# Patient Record
Sex: Male | Born: 1960 | Race: White | Hispanic: No | Marital: Married | Smoking: Former smoker
Health system: Southern US, Community
[De-identification: ages and names within clinical notes are randomized; demographics above are authoritative.]

## PROBLEM LIST (undated history)

## (undated) DIAGNOSIS — F329 Major depressive disorder, single episode, unspecified: Secondary | ICD-10-CM

## (undated) DIAGNOSIS — G4733 Obstructive sleep apnea (adult) (pediatric): Secondary | ICD-10-CM

## (undated) DIAGNOSIS — T7840XA Allergy, unspecified, initial encounter: Secondary | ICD-10-CM

## (undated) DIAGNOSIS — E119 Type 2 diabetes mellitus without complications: Secondary | ICD-10-CM

## (undated) DIAGNOSIS — R112 Nausea with vomiting, unspecified: Secondary | ICD-10-CM

## (undated) DIAGNOSIS — E785 Hyperlipidemia, unspecified: Secondary | ICD-10-CM

## (undated) DIAGNOSIS — F32A Depression, unspecified: Secondary | ICD-10-CM

## (undated) DIAGNOSIS — I10 Essential (primary) hypertension: Secondary | ICD-10-CM

## (undated) DIAGNOSIS — J439 Emphysema, unspecified: Secondary | ICD-10-CM

## (undated) DIAGNOSIS — F419 Anxiety disorder, unspecified: Secondary | ICD-10-CM

## (undated) DIAGNOSIS — J449 Chronic obstructive pulmonary disease, unspecified: Secondary | ICD-10-CM

## (undated) DIAGNOSIS — M1990 Unspecified osteoarthritis, unspecified site: Secondary | ICD-10-CM

## (undated) DIAGNOSIS — Z9889 Other specified postprocedural states: Secondary | ICD-10-CM

## (undated) HISTORY — DX: Type 2 diabetes mellitus without complications: E11.9

## (undated) HISTORY — DX: Unspecified osteoarthritis, unspecified site: M19.90

## (undated) HISTORY — PX: CARDIAC CATHETERIZATION: SHX172

## (undated) HISTORY — DX: Hyperlipidemia, unspecified: E78.5

## (undated) HISTORY — DX: Chronic obstructive pulmonary disease, unspecified: J44.9

## (undated) HISTORY — PX: LEG SURGERY: SHX1003

## (undated) HISTORY — DX: Obstructive sleep apnea (adult) (pediatric): G47.33

## (undated) HISTORY — PX: SHOULDER ARTHROSCOPY: SHX128

## (undated) HISTORY — DX: Emphysema, unspecified: J43.9

## (undated) HISTORY — DX: Essential (primary) hypertension: I10

## (undated) HISTORY — DX: Morbid (severe) obesity due to excess calories: E66.01

## (undated) HISTORY — DX: Allergy, unspecified, initial encounter: T78.40XA

## (undated) HISTORY — PX: TONSILLECTOMY: SUR1361

---

## 2004-08-17 ENCOUNTER — Emergency Department (HOSPITAL_COMMUNITY): Admission: EM | Admit: 2004-08-17 | Discharge: 2004-08-17 | Payer: Self-pay | Admitting: Emergency Medicine

## 2008-03-19 ENCOUNTER — Inpatient Hospital Stay (HOSPITAL_COMMUNITY): Admission: EM | Admit: 2008-03-19 | Discharge: 2008-03-20 | Payer: Self-pay | Admitting: Emergency Medicine

## 2008-03-20 ENCOUNTER — Encounter (INDEPENDENT_AMBULATORY_CARE_PROVIDER_SITE_OTHER): Payer: Self-pay | Admitting: *Deleted

## 2008-03-20 ENCOUNTER — Ambulatory Visit: Payer: Self-pay | Admitting: Vascular Surgery

## 2009-06-09 ENCOUNTER — Encounter: Payer: Self-pay | Admitting: Pulmonary Disease

## 2009-08-18 ENCOUNTER — Encounter: Admission: RE | Admit: 2009-08-18 | Discharge: 2009-08-18 | Payer: Self-pay | Admitting: Emergency Medicine

## 2009-09-08 ENCOUNTER — Encounter: Payer: Self-pay | Admitting: Pulmonary Disease

## 2010-02-06 ENCOUNTER — Encounter: Payer: Self-pay | Admitting: Internal Medicine

## 2010-02-06 ENCOUNTER — Encounter: Admission: RE | Admit: 2010-02-06 | Discharge: 2010-02-06 | Payer: Self-pay | Admitting: Internal Medicine

## 2010-04-17 ENCOUNTER — Ambulatory Visit: Payer: Self-pay | Admitting: Internal Medicine

## 2010-04-17 DIAGNOSIS — J438 Other emphysema: Secondary | ICD-10-CM

## 2010-04-17 DIAGNOSIS — R0609 Other forms of dyspnea: Secondary | ICD-10-CM | POA: Insufficient documentation

## 2010-04-17 DIAGNOSIS — J449 Chronic obstructive pulmonary disease, unspecified: Secondary | ICD-10-CM | POA: Insufficient documentation

## 2010-04-17 DIAGNOSIS — R0989 Other specified symptoms and signs involving the circulatory and respiratory systems: Secondary | ICD-10-CM

## 2010-04-17 DIAGNOSIS — I10 Essential (primary) hypertension: Secondary | ICD-10-CM | POA: Insufficient documentation

## 2010-04-17 DIAGNOSIS — R635 Abnormal weight gain: Secondary | ICD-10-CM | POA: Insufficient documentation

## 2010-05-18 ENCOUNTER — Ambulatory Visit: Payer: Self-pay | Admitting: Internal Medicine

## 2010-05-18 DIAGNOSIS — G479 Sleep disorder, unspecified: Secondary | ICD-10-CM | POA: Insufficient documentation

## 2010-05-18 DIAGNOSIS — R05 Cough: Secondary | ICD-10-CM | POA: Insufficient documentation

## 2010-05-18 DIAGNOSIS — R059 Cough, unspecified: Secondary | ICD-10-CM | POA: Insufficient documentation

## 2010-05-19 ENCOUNTER — Telehealth (INDEPENDENT_AMBULATORY_CARE_PROVIDER_SITE_OTHER): Payer: Self-pay | Admitting: *Deleted

## 2010-05-20 ENCOUNTER — Ambulatory Visit: Payer: Self-pay | Admitting: Pulmonary Disease

## 2010-05-20 DIAGNOSIS — G4733 Obstructive sleep apnea (adult) (pediatric): Secondary | ICD-10-CM | POA: Insufficient documentation

## 2010-05-21 ENCOUNTER — Telehealth (INDEPENDENT_AMBULATORY_CARE_PROVIDER_SITE_OTHER): Payer: Self-pay | Admitting: *Deleted

## 2010-05-22 ENCOUNTER — Telehealth (INDEPENDENT_AMBULATORY_CARE_PROVIDER_SITE_OTHER): Payer: Self-pay | Admitting: *Deleted

## 2010-05-22 ENCOUNTER — Ambulatory Visit: Payer: Self-pay | Admitting: Cardiology

## 2010-06-08 ENCOUNTER — Telehealth (INDEPENDENT_AMBULATORY_CARE_PROVIDER_SITE_OTHER): Payer: Self-pay | Admitting: *Deleted

## 2010-07-23 ENCOUNTER — Encounter: Payer: Self-pay | Admitting: Pulmonary Disease

## 2010-08-11 NOTE — Miscellaneous (Signed)
Summary: Orders Update pft charges  Clinical Lists Changes  Orders: Added new Service order of Carbon Monoxide diffusing w/capacity (94720) - Signed Added new Service order of Lung Volumes (94240) - Signed Added new Service order of Spirometry (Pre & Post) (94060) - Signed 

## 2010-08-11 NOTE — Assessment & Plan Note (Signed)
Summary: consult for osa, daytime sleepiness.   Visit Type:  Initial Consult Copy to:  Sandrea Hughs MD Primary Provider/Referring Provider:  Dr. Perrin Maltese  CC:  Sleep consult. pt currently has cpap machine. pt c/o waking up more at night, feels like he is suffication, and stuffiness.  History of Present Illness: The pt is a 50y/o male who I have been asked to see for management of osa.  He was diagnosed with osa one year ago, with AHI of 15/hr and desat to 83%.  There were no PLMS observed.  The pt underwent cpap titration study in Feb of this year, which showed his optimal pressure to be anywhere between 5 and 9cm of water.  The pt has been on cpap with nasal pillows for the last 6mos, and denies any issues with mouth opening or pressure loss.  He does have heated humidity.  He is unsure where his machine is currently set wrt pressure.  The pt currently goes to bed btw 11pm-3am, and arises btw 6-7am to start his day.  Prior to cpap, he denied having loud snoring, pauses in his breathing during sleep, or choking arousals.  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  He has felt all along this was due to side effects from meds.  The pt denies any alertness issues with tv, reading, or computer work prior to starting cpap.  Since he has been on cpap, he has seen no difference in his sleep quality or alertness with driving in the late afternoons.  Of note, the pt also states that he has had insomnia "all of his life", and currently is requiring ambien to get to sleep at all.  His epworth score today is only 6, and he tells me that his weight is neutral over the last one year although has increased significantly over the last few years.  Current Medications (verified): 1)  Aspirin 81 Mg Tbec (Aspirin) .Marland Kitchen.. 1 Once Daily 2)  Ibuprofen 600 Mg Tabs (Ibuprofen) .... Take With  Meals As Needed For Pain 3)  Neurontin 300 Mg Caps (Gabapentin) .... 3 Tablets Three Times A Day 4)  Simvastatin 20 Mg Tabs (Simvastatin) .Marland Kitchen.. 1 Once Daily 5)  Zolpidem Tartrate 10 Mg Tabs (Zolpidem Tartrate) .Marland Kitchen.. 1 At Bedtime As Needed 6)  Tramadol Hcl 50 Mg Tabs (Tramadol Hcl) .... Up To 8 Per Day As Needed 7)  Nitrostat 0.4 Mg Subl (Nitroglycerin) .... As Directed As Needed 8)  Alprazolam 1 Mg Tabs (Alprazolam) .... 1/2 To 2  Daily As Needed 9)  Daily Multiple Vitamins  Tabs (Multiple Vitamin) .Marland Kitchen.. 1 Once Daily 10)  Vitamin D 2000 Unit Tabs (Cholecalciferol) .Marland Kitchen.. 1 Once Daily 11)  B-100 Complex  Tabs (Vitamins-Lipotropics) .Marland Kitchen.. 1 Once Daily 12)  Garlic 100 Mg .Marland Kitchen.. 1 Once Daily 13)  Lutein 40 Mg Caps (Lutein) .Marland Kitchen.. 1 Once Daily 14)  Calcium 500 Mg Tabs (Calcium) .Marland Kitchen.. 1 Once Daily 15)  Magnesium 500 Mg Tabs (Magnesium) .Marland Kitchen.. 1 Once Daily 16)  Potassium 99 Mg Tabs (Potassium) .Marland Kitchen.. 1 Once Daily 17)  Shark Cartilage 740 Mg Caps (Shark Cartilage) .Marland Kitchen.. 1 Three Times A Day 18)  Benicar Hct 20-12.5 Mg  Tabs (Olmesartan Medoxomil-Hctz) .... One Tablet By Mouth Daily 19)  Pepcid 20 Mg Tabs (Famotidine) .... Take One By Mouth At Bedtime 20)  Prilosec Otc 20 Mg  Tbec (Omeprazole Magnesium) .... Take  One 30-60 Min Before First Meal of The Day  Allergies (verified): 1)  ! Pcn 2)  ! Diovan  Past History:  Social History: Last updated: 04/17/2010 Former smoker.  Quit in Feb 2005.  Smoked 26 yrs up to 2 ppd Rare ETOH Lives with his partner GM at [employer]  Past Medical History: Hyptertension     - try off ACE April 17, 2010  Unexplained/ variable sob since 2000      - PFT's wnl May 18, 2010 x ERV 24% Morbid Obesity OSA--mild by npsg 2010 with AHI 14/hr.      low cholesterol  Past Surgical History: Tonsillectomy left leg surrery  Family History: Reviewed history from 04/17/2010 and no changes required. Emphysema- PGF Lung CA- Uncle- never was a smoker Breast CA- Mother,  mgm Gallbladder CA- Father Ovarian CA- Mother MI--mgf mouth cancer--mgm skin--everyone  Social History: Reviewed history from 04/17/2010 and no changes required. Former smoker.  Quit in Feb 2005.  Smoked 26 yrs up to 2 ppd Rare ETOH Lives with his partner GM at [employer]  Review of Systems       The patient complains of shortness of breath with activity, shortness of breath at rest, non-productive cough, chest pain, difficulty swallowing, tooth/dental problems, nasal congestion/difficulty breathing through nose, sneezing, anxiety, depression, hand/feet swelling, joint stiffness or pain, and fever.  The patient denies productive cough, irregular heartbeats, acid heartburn, indigestion, loss of appetite, weight change, abdominal pain, sore throat, headaches, itching, ear ache, rash, and change in color of mucus.    Vital Signs:  Patient profile:   50 year old male Height:      76 inches Weight:      297.25 pounds O2 Sat:      92 % on Room air Temp:     98.7 degrees F oral Pulse rate:   88 / minute BP sitting:   130 / 72  (left arm) Cuff size:   large  Vitals Entered By: Carver Fila (May 20, 2010 10:58 AM)  O2 Flow:  Room air CC: Sleep consult. pt currently has cpap machine. pt c/o waking up more at night, feels like he is suffication, stuffiness Comments meds and allergies updated Phone number updated Carver Fila  May 20, 2010 10:58 AM    Physical Exam  General:  obese male in nad Eyes:  PERRLA and EOMI.   Nose:  patent without discharge or obstruction Mouth:  small oropharynx, palate and uvula not overly enlarged. Neck:  no jvd, tmg, LN Lungs:  clear to auscultation no wheezing or rhonchi Heart:  rrr, no mrg Abdomen:  soft and nontender, bs+ Extremities:  no significant edema, no cyanosis pulses intact distally. Neurologic:  alert and oriented, moves all 4.   Impression & Recommendations:  Problem # 1:  OBSTRUCTIVE SLEEP APNEA (ICD-327.23) the  pt has a h/o mild osa, and feels he has had no response to cpap since he started.  His only complaint of sleepiness occurs when he is driving in late afternoon and evening, and has no issues with sleepiness with other periods of inactivity such as reading, tv, desk work.  I am wondering how much of his sleepiness is simply due to his medications, given his lack of clinical response to cpap.  There are a lot of pts with mild osa that are totally asymptomatic, and his degree of sleep apnea really does not represent a significant increase in CV  risk.  Finally, the pt obviously has significant sleep hygiene (not enough sleep) and insomnia issues which are may be contributing more than his sleep disordered breathing.  Will work on the  premise for now that his sleep apnea is the primary issue.  Will arrange for re-optimization of his pressure with an auto device at home, and see if he has improvement.  If he sees no difference with the cpap, I would consider discontinuing since it may actually be making things worse, and suggest the pt work on his sleep hygiene.  He would also need to be referred to a behavioral therapist for cognitive behavioral therapy to treat his lifelong insomnia.  Would also consider discontinuing some of his meds that can contribute to daytime sleepiness.    Medications Added to Medication List This Visit: 1)  Neurontin 300 Mg Caps (Gabapentin) .... 3 tablets three times a day  Other Orders: Consultation Level IV (16109) DME Referral (DME)  Patient Instructions: 1)  will have your pressure re-optimized with an auto machine at home for the next 3 weeks.  will contact you once I receive your download. 2)  work on weight loss   Orders Added: 1)  Consultation Level IV [60454] 2)  DME Referral [DME]    Immunization History:  Influenza Immunization History:    Influenza:  historical (05/12/2010)  Pneumovax Immunization History:    Pneumovax:  historical (05/12/2010)

## 2010-08-11 NOTE — Progress Notes (Signed)
Summary: Losartan HCT RX called to [facility]  Phone Note Call from Patient Call back at Work Phone [phone]   Caller: Patient Call For: wert Summary of Call: calling about his losardone potassium please call Initial call taken by: Lacinda Axon,  June 08, 2010 9:13 AM  Follow-up for Phone Call        Pt says [facility] did not receive new RX for Losartan HCT. RX called to [facility] for the pt and pt is aware.Michel Bickers [facility]  June 08, 2010 10:39 AM

## 2010-08-11 NOTE — Progress Notes (Signed)
Summary: doens't want micardis> change to hyzaar 50/12.5  Phone Note Call from Patient   Caller: Patient Call For: wert Summary of Call: although pt's ins cover micardis, pt can't afford this and doesn't want it called in (nurse has already called this in to [facility]. pls advise.  Initial call taken by: Tivis Ringer, CNA,  May 22, 2010 9:39 AM  Follow-up for Phone Call        Pt states that micardis will be too expensive and requests that we change to the other covered medication which is Losartan-HCTZ. I called [facility] to cancel order for Micardis. Pelase advise if ok to change to losartan HCTZ, if so what strength. Carron Curie CMA  May 22, 2010 10:37 AM fine with me dose is 50/12.5   Follow-up by: Nyoka Cowden MD,  May 22, 2010 10:47 AM  Additional Follow-up for Phone Call Additional follow up Details #1::        Patient is aware MW will change Micardis HCT to Losartan HCT and we will send RX to [facility] for this.    New/Updated Medications: LOSARTAN POTASSIUM-HCTZ 50-12.5 MG TABS (LOSARTAN POTASSIUM-HCTZ) 1 by mouth daily Prescriptions: LOSARTAN POTASSIUM-HCTZ 50-12.5 MG TABS (LOSARTAN POTASSIUM-HCTZ) 1 by mouth daily  #90 x 4   Entered by:   Michel Bickers CMA   Authorized by:   Nyoka Cowden MD   Signed by:   Michel Bickers CMA on 05/22/2010   Method used:   Faxed to ...       [facility] (mail-order)             , [place]         Ph: [phone]       Fax: [phone]   RxID:   [id number]

## 2010-08-11 NOTE — Assessment & Plan Note (Signed)
Summary: Pulmonary consultation/ unexplained sob, try off ACEI first   Visit Type:  Initial Consult Copy to:  Dr. Perrin Maltese Primary Provider/Referring Provider:  Dr. Perrin Maltese  CC:  Restrictive Lung Dz.  History of Present Illness: 40 yowm quit smoking 2005 with pattern of recurrent pna in 20's and since 2000 sob attributed to weight gain and fatigue and now recurent episodes of sinus dz and "walking pna" esp bad since 2010  April 17, 2010  1st pulmonary office eval on ace cc sob  sometimes sitting still for up to a year never fully recovers x 1 year and sensation chest heaviness esp lying down x 1 year so underwent sleep study 6 months but getting worse if anything.  doe x [employer] sometimes a struggle but varies without any explanation and not directly related to activity.   Assoc with dry cough and sense of chest congestion sore throat and dysphagia but not excess mucus or noct or early am flares.  Pt denies any significant assoc  itching, sneezing,  nasal congestion or excess secretions,  fever, chills, sweats, unintended wt loss, pleuritic or exertional cp, hempoptysis,   orthopnea pnd or leg swelling. Pt also denies any obvious fluctuation in symptoms with weather or environmental change or other alleviating or aggravating factors.  doesn't think inhalers help       Current Medications (verified): 1)  Aspirin 81 Mg Tbec (Aspirin) .Marland Kitchen.. 1 Once Daily 2)  Ibuprofen 600 Mg Tabs (Ibuprofen) .Marland Kitchen.. 1 Once Daily 3)  Lisinopril-Hydrochlorothiazide 10-12.5 Mg Tabs (Lisinopril-Hydrochlorothiazide) .Marland Kitchen.. 1 Once Daily 4)  Neurontin 300 Mg Caps (Gabapentin) .... 3 Three Times A Day 5)  Simvastatin 20 Mg Tabs (Simvastatin) .Marland Kitchen.. 1 Once Daily 6)  Zolpidem Tartrate 10 Mg Tabs (Zolpidem Tartrate) .Marland Kitchen.. 1 At Bedtime As Needed 7)  Tramadol Hcl 50 Mg Tabs (Tramadol Hcl) .... Up To 8 Per Day As Needed 8)  Nitrostat 0.4 Mg Subl (Nitroglycerin) .... As Directed As Needed 9)  Alprazolam 1 Mg Tabs (Alprazolam) .... 1/2  To 2 Once Daily As Needed 10)  Daily Multiple Vitamins  Tabs (Multiple Vitamin) .Marland Kitchen.. 1 Once Daily 11)  Vitamin D 2000 Unit Tabs (Cholecalciferol) .Marland Kitchen.. 1 Once Daily 12)  B-100 Complex  Tabs (Vitamins-Lipotropics) .Marland Kitchen.. 1 Once Daily 13)  Garlic 100 Mg .Marland Kitchen.. 1 Once Daily 14)  Lutein 40 Mg Caps (Lutein) .Marland Kitchen.. 1 Once Daily 15)  Calcium 500 Mg Tabs (Calcium) .Marland Kitchen.. 1 Once Daily 16)  Magnesium 500 Mg Tabs (Magnesium) .Marland Kitchen.. 1 Once Daily 17)  Potassium 99 Mg Tabs (Potassium) .Marland Kitchen.. 1 Once Daily 18)  Shark Cartilage 740 Mg Caps (Shark Cartilage) .Marland Kitchen.. 1 Three Times A Day  Allergies (verified): 1)  ! Pcn 2)  ! Diovan  Past History:  Past Medical History: Hyptertension     - try off ACE April 17, 2010  Unexplained/ variable sob since 2000 Morbid Obesity  Family History: Emphysema- PGF Lung CA- Uncle- never was a smoker Breast CA- Mother Gallbladder CA- Father Ovarian CA- Mother  Social History: Former smoker.  Quit in Feb 2005.  Smoked 26 yrs up to 2 ppd Rare ETOH Lives with his partner GM at [employer]  Review of Systems       The patient complains of shortness of breath with activity, shortness of breath at rest, non-productive cough, chest pain, irregular heartbeats, weight change, difficulty swallowing, sore throat, tooth/dental problems, nasal congestion/difficulty breathing through nose, sneezing, anxiety, depression, hand/feet swelling, joint stiffness or pain, rash, and change in color  of mucus.  The patient denies productive cough, coughing up blood, acid heartburn, indigestion, loss of appetite, abdominal pain, headaches, itching, ear ache, and fever.    Vital Signs:  Patient profile:   50 year old male Height:      76 inches Weight:      294 pounds BMI:     35.92 O2 Sat:      96 % on Room air Temp:     98.3 degrees F oral Pulse rate:   97 / minute BP sitting:   148 / 86  (left arm) Cuff size:   large  Vitals Entered By: Vernie Murders (April 17, 2010 10:13 AM)  O2  Flow:  Room air  Serial Vital Signs/Assessments:  Comments: 10:57 AM Ambulatory Pulse Oximetry  Resting; HR__96___    02 Sat_94% on room air____  Lap1 (185 feet)   HR_162____   02 Sat__91% on room air___ Lap2 (185 feet)   HR__168___   02 Sat_91% on room air____    Lap3 (185 feet)   HR__160___   02 Sat__91% on room air___  _x__Test Completed without Difficulty ___Test Stopped due to:  By: Michel Bickers CMA    Physical Exam  Additional Exam:  obese amb wm very pleasant but failed to answer a single question asked in a straightforward manner, tending to go off on tangents or answer questions with ambiguous medical terms or diagnoses and seemed perplexed  when asked the same question more than once for clarification.  wt  294 April 17, 2010 HEENT: nl dentition, turbinates, and orophanx. Nl external ear canals without cough reflex NECK :  without JVD/Nodes/TM/ nl carotid upstrokes bilaterally LUNGS: no acc muscle use, clear to A and P bilaterally without cough on insp or exp maneuvers CV:  RRR  no s3 or murmur or increase in P2, no edema  EPP:IRJJO obese but o/w  soft and nontender with nl excursion in the supine position. No bruits or organomegaly, bowel sounds nl MS:  warm without deformities, calf tenderness, cyanosis or clubbing SKIN: warm and dry without lesions   NEURO:  alert, approp, no deficits     CT of Chest  Procedure date:  04/09/2010  Findings:      Interval resolution of nodular densities in LLL, mild centrilobular emphysema in the apices  Impression & Recommendations:  Problem # 1:  DYSPNEA (ICD-786.09) Symptoms are atypical and mostly upper airway   DDX of  difficult airways managment all start with A and  include Adherence, Ace Inhibitors, Acid Reflux, Active Sinus Disease, Alpha 1 Antitripsin deficiency, Anxiety masquerading as Airways dz,  ABPA,  allergy(esp in young), Aspiration (esp in elderly), Adverse effects of DPI,  Active smokers, plus one B  =  Beta blocker use.. and one C = CHF   Ace inhibitors would appear to be the leading suspect and need to be stopped before returning here for further w/u  Acid reflux also a concern given his body habitus - See instructions for specific recommendations   Active sinus dz a concern given propensity to recurrent sinus c/os and "walking pna" so low threshold for sinus/ allergy w/u after regroup off ace  Problem # 2:  HYPERTENSION (ICD-401.9)  The following medications were removed from the medication list:    Lisinopril-hydrochlorothiazide 10-12.5 Mg Tabs (Lisinopril-hydrochlorothiazide) .Marland Kitchen... 1 once daily His updated medication list for this problem includes:    Benicar Hct 20-12.5 Mg Tabs (Olmesartan medoxomil-hctz) ..... One tablet by mouth daily  ACE inhibitors are problematic in  pts with airway complaints because  even experienced pulmonologists can't always distinguish ace effects from copd/asthma.  By themselves they don't actually cause a problem, much like oxygen can't by itself start a fire, but they certainly serve as a powerful catalyst or enhancer for any "fire"  or inflammatory process in the upper airway, be it caused by an ET  tube or more commonly reflux (especially in the obese or pts with known GERD or who are on biphoshonates).  In the era of ARB near equivalency until we have a better handle on the reversibility of the airway problem, it just makes sense to avoid ace entirely in the short run and then decide later, having established a level of airway control using a reasonable limited regimen, whether to add back ace but even then being very careful to observe the pt for worsening airway control and number of meds used/ needed to control symptoms.   In this case I would probably avoid them entirely  Problem # 3:  WEIGHT GAIN, ABNORMAL (ICD-783.1) PFt's to date are most c/w effects of obesity.   Weight control is a matter of calorie balance which needs to be tilted in the  pt's favor by eating less and exercising more.  Specifically, I recommended  exercise at a level where pt  is short of breath but not out of breath 30 minutes daily.  If not losing weight on this program, I would strongly recommend pt see a nutritionist with a food diary recorded for two weeks prior to the visit.     Medications Added to Medication List This Visit: 1)  Aspirin 81 Mg Tbec (Aspirin) .Marland Kitchen.. 1 once daily 2)  Ibuprofen 600 Mg Tabs (Ibuprofen) .Marland Kitchen.. 1 once daily 3)  Lisinopril-hydrochlorothiazide 10-12.5 Mg Tabs (Lisinopril-hydrochlorothiazide) .Marland Kitchen.. 1 once daily 4)  Neurontin 300 Mg Caps (Gabapentin) .... 3 three times a day 5)  Simvastatin 20 Mg Tabs (Simvastatin) .Marland Kitchen.. 1 once daily 6)  Zolpidem Tartrate 10 Mg Tabs (Zolpidem tartrate) .Marland Kitchen.. 1 at bedtime as needed 7)  Tramadol Hcl 50 Mg Tabs (Tramadol hcl) .... Up to 8 per day as needed 8)  Nitrostat 0.4 Mg Subl (Nitroglycerin) .... As directed as needed 9)  Alprazolam 1 Mg Tabs (Alprazolam) .... 1/2 to 2 once daily as needed 10)  Daily Multiple Vitamins Tabs (Multiple vitamin) .Marland Kitchen.. 1 once daily 11)  Vitamin D 2000 Unit Tabs (Cholecalciferol) .Marland Kitchen.. 1 once daily 12)  B-100 Complex Tabs (Vitamins-lipotropics) .Marland Kitchen.. 1 once daily 13)  Garlic 100 Mg  .Marland Kitchen.. 1 once daily 14)  Lutein 40 Mg Caps (Lutein) .Marland Kitchen.. 1 once daily 15)  Calcium 500 Mg Tabs (Calcium) .Marland Kitchen.. 1 once daily 16)  Magnesium 500 Mg Tabs (Magnesium) .Marland Kitchen.. 1 once daily 17)  Potassium 99 Mg Tabs (Potassium) .Marland Kitchen.. 1 once daily 18)  Shark Cartilage 740 Mg Caps (Shark cartilage) .Marland Kitchen.. 1 three times a day 19)  Benicar Hct 20-12.5 Mg Tabs (Olmesartan medoxomil-hctz) .... One tablet by mouth daily 20)  Pepcid 20 Mg Tabs (Famotidine) .... Take one by mouth at bedtime  Complete Medication List: 1)  Aspirin 81 Mg Tbec (Aspirin) .Marland Kitchen.. 1 once daily 2)  Ibuprofen 600 Mg Tabs (Ibuprofen) .Marland Kitchen.. 1 once daily 3)  Neurontin 300 Mg Caps (Gabapentin) .... 3 three times a day 4)  Simvastatin 20 Mg Tabs  (Simvastatin) .Marland Kitchen.. 1 once daily 5)  Zolpidem Tartrate 10 Mg Tabs (Zolpidem tartrate) .Marland Kitchen.. 1 at bedtime as needed 6)  Tramadol Hcl  50 Mg Tabs (Tramadol hcl) .... Up to 8 per day as needed 7)  Nitrostat 0.4 Mg Subl (Nitroglycerin) .... As directed as needed 8)  Alprazolam 1 Mg Tabs (Alprazolam) .... 1/2 to 2 once daily as needed 9)  Daily Multiple Vitamins Tabs (Multiple vitamin) .Marland Kitchen.. 1 once daily 10)  Vitamin D 2000 Unit Tabs (Cholecalciferol) .Marland Kitchen.. 1 once daily 11)  B-100 Complex Tabs (Vitamins-lipotropics) .Marland Kitchen.. 1 once daily 12)  Garlic 100 Mg  .Marland Kitchen.. 1 once daily 13)  Lutein 40 Mg Caps (Lutein) .Marland Kitchen.. 1 once daily 14)  Calcium 500 Mg Tabs (Calcium) .Marland Kitchen.. 1 once daily 15)  Magnesium 500 Mg Tabs (Magnesium) .Marland Kitchen.. 1 once daily 16)  Potassium 99 Mg Tabs (Potassium) .Marland Kitchen.. 1 once daily 17)  Shark Cartilage 740 Mg Caps (Shark cartilage) .Marland Kitchen.. 1 three times a day 18)  Benicar Hct 20-12.5 Mg Tabs (Olmesartan medoxomil-hctz) .... One tablet by mouth daily 19)  Pepcid 20 Mg Tabs (Famotidine) .... Take one by mouth at bedtime  Other Orders: Pulse Oximetry, Ambulatory (16109) Consultation Level V (60454)  Patient Instructions: 1)  Benicar 20 mg/ 12.5 mg one daily in place of lisniopril 2)  Pepcid 20 mg at bedtime 3)  GERD (REFLUX)  is a common cause of respiratory symptoms. It commonly presents without heartburn and can be treated with medication, but also with lifestyle changes including avoidance of late meals, excessive alcohol, smoking cessation, and avoid fatty foods, chocolate, peppermint, colas, red wine, and acidic juices such as orange juice. NO MINT OR MENTHOL PRODUCTS SO NO COUGH DROPS  4)  USE SUGARLESS CANDY INSTEAD (jolley ranchers)  5)  NO OIL BASED VITAMINS  6)  Please schedule a follow-up appointment in 4 weeks, sooner if needed with PFT's

## 2010-08-11 NOTE — Progress Notes (Signed)
Summary: OK to cancel ct sinus if already done recent-LMTCB x 1  ---- Converted from flag ---- ---- 05/19/2010 7:34 AM, Nyoka Cowden MD wrote: see late addendum, I ordered sinus ct but ok to cancel if he already has done one or had recent ent eval ------------------------------  LMTCB x 1  Vernie Murders  May 19, 2010 5:14 PM Per [facility]- pt had last had sinus ct back in 2003 and has not had ENT eval.  He is sched to have CT Sinus done on 05/22/10.  Is this still okay? Pls advise thanks! Vernie Murders  May 20, 2010 2:44 PM yes, ok! Nyoka Cowden MD  May 20, 2010 3:40 PM

## 2010-08-11 NOTE — Assessment & Plan Note (Signed)
Summary: Pulmonary/ ext summary f/u ov   Copy to:  Dr. Perrin Maltese Primary Jovon Streetman/Referring Magdelena Kinsella:  Dr. Perrin Maltese  CC:  Dyspnea- the same.  History of Present Illness: 59 yowm quit smoking 2005 with pattern of recurrent pna in 20's and since 2000 sob attributed to weight gain and fatigue and now recurent episodes of sinus dz and "walking pna" esp bad since 2010  April 17, 2010  1st pulmonary office eval on ace cc sob  sometimes sitting still for up to a year never fully recovers x 1 year and sensation chest heaviness esp lying down x 1 year so underwent sleep study 6 months but getting worse if anything.  doe x [employer] sometimes a struggle but varies without any explanation and not directly related to activity.   Assoc with dry cough and sense of chest congestion sore throat and dysphagia but not excess mucus or noct or early am flares.  imp uacs  rec Benicar 20 mg/ 12.5 mg one daily in place of lisniopril Pepcid 20 mg at bedtime  May 18, 2010 ov f/u sob at rest and immediately on lie down but overall much better sense of congestion " no longer swimming in mucus"  - sleeping is erratic, poor quality, tried sleep eval rx per [facility] not happy with rx.  no exertional sob but very sendentary.  Pt denies any significant sore throat, dysphagia, itching, sneezing,  nasal congestion or excess secretions,  fever, chills, sweats, unintended wt loss, pleuritic or exertional cp, hempoptysis, change in activity tolerance  orthopnea pnd or leg swelling Pt also denies any obvious fluctuation in symptoms with weather or environmental change or other alleviating or aggravating factors.  no overt hb     Current Medications (verified): 1)  Aspirin 81 Mg Tbec (Aspirin) .Marland Kitchen.. 1 Once Daily 2)  Ibuprofen 600 Mg Tabs (Ibuprofen) .Marland Kitchen.. 1 Once Daily 3)  Neurontin 300 Mg Caps (Gabapentin) .... 3 Three Times A Day 4)  Simvastatin 20 Mg Tabs (Simvastatin) .Marland Kitchen.. 1 Once Daily 5)  Zolpidem Tartrate 10 Mg Tabs (Zolpidem  Tartrate) .Marland Kitchen.. 1 At Bedtime As Needed 6)  Tramadol Hcl 50 Mg Tabs (Tramadol Hcl) .... Up To 8 Per Day As Needed 7)  Nitrostat 0.4 Mg Subl (Nitroglycerin) .... As Directed As Needed 8)  Alprazolam 1 Mg Tabs (Alprazolam) .... 1/2 To 2  Daily As Needed 9)  Daily Multiple Vitamins  Tabs (Multiple Vitamin) .Marland Kitchen.. 1 Once Daily 10)  Vitamin D 2000 Unit Tabs (Cholecalciferol) .Marland Kitchen.. 1 Once Daily 11)  B-100 Complex  Tabs (Vitamins-Lipotropics) .Marland Kitchen.. 1 Once Daily 12)  Garlic 100 Mg .Marland Kitchen.. 1 Once Daily 13)  Lutein 40 Mg Caps (Lutein) .Marland Kitchen.. 1 Once Daily 14)  Calcium 500 Mg Tabs (Calcium) .Marland Kitchen.. 1 Once Daily 15)  Magnesium 500 Mg Tabs (Magnesium) .Marland Kitchen.. 1 Once Daily 16)  Potassium 99 Mg Tabs (Potassium) .Marland Kitchen.. 1 Once Daily 17)  Shark Cartilage 740 Mg Caps (Shark Cartilage) .Marland Kitchen.. 1 Three Times A Day 18)  Benicar Hct 20-12.5 Mg  Tabs (Olmesartan Medoxomil-Hctz) .... One Tablet By Mouth Daily 19)  Pepcid 20 Mg Tabs (Famotidine) .... Take One By Mouth At Bedtime  Allergies (verified): 1)  ! Pcn 2)  ! Diovan  Past History:  Past Medical History: Hyptertension     - try off ACE April 17, 2010  Unexplained/ variable sob since 2000      - PFT's wnl May 18, 2010 x ERV 24% Morbid Obesity OSA  rx per [facility]     - Referred to [facility] May 19, 2010 at pt's request for second opinion  Vital Signs:  Patient profile:   50 year old male Weight:      292 pounds O2 Sat:      93 % on Room air Temp:     97.6 degrees F oral Pulse rate:   90 / minute BP sitting:   128 / 74  (left arm) Cuff size:   large  Vitals Entered By: Vernie Murders (May 18, 2010 10:06 AM)  O2 Flow:  Room air  Physical Exam  Additional Exam:  obese amb  nad ,  a very pleasant wm who tends not to answer questions directly wt  294 April 17, 2010 > 292 May 18, 2010  HEENT: nl dentition, turbinates, and orophanx. Nl external ear canals without cough reflex NECK :  without JVD/Nodes/TM/ nl carotid upstrokes  bilaterally LUNGS: no acc muscle use, clear to A and P bilaterally without cough on insp or exp maneuvers CV:  RRR  no s3 or murmur or increase in P2, no edema  ZOX:WRUEA obese but o/w  soft and nontender with nl excursion in the supine position. No bruits or organomegaly, bowel sounds nl MS:  warm without deformities, calf tenderness, cyanosis or clubbing SKIN: warm and dry without lesions      Impression & Recommendations:  Problem # 1:  DYSPNEA (ICD-786.09)  Classic Upper airway cough syndrome, so named because it's frequently impossible to sort out how much is  CR/sinusitis with freq throat clearing (which can be related to primary GERD)   vs  causing  secondary extra esophageal GERD from wide swings in gastric pressure that occur with throat clearing, promoting self use of mint and menthol lozenges that reduce the lower esophageal sphincter tone and exacerbate the problem further These are the same pts who not infrequently have failed to tolerate ace inhibitors,  dry powder inhalers or biphosphonates or report having reflux symptoms that don't respond to standard doses of PPI    Ace inhibitors  the leading suspect and  he is definitely better off them Acid reflux also a concern given his body habitus - See instructions for specific recommendations   ?Active sinus dz a concern given propensity to recurrent sinus c/os and "walking pna" so low threshold for sinus/ allergy w/u after regroup off ace  Obesity is driving most of his problems as evidenced  by striking/ disproproportionate drop in ERV   Problem # 2:  SLEEP DISORDER UNSPECIFIED (ICD-780.50)  Refer to sleep doc as not happy with rx per [facility]  Problem # 3:  HYPERTENSION (ICD-401.9) ok off ace His updated medication list for this problem includes:    Benicar Hct 20-12.5 Mg Tabs (Olmesartan medoxomil-hctz) ..... One tablet by mouth daily   ACE inhibitors are problematic in  pts with airway complaints because  even  experienced pulmonologists can't always distinguish ace effects from copd/asthma.  By themselves they don't actually cause a problem, much like oxygen can't by itself start a fire, but they certainly serve as a powerful catalyst or enhancer for any "fire"  or inflammatory process in the upper airway, be it caused by an ET  tube or more commonly reflux (especially in the obese or pts with known GERD or who are on biphoshonates).  In the era of ARB near equivalency until we have a better handle on the reversibility of the airway problem, it just makes  sense to avoid ace entirely in the short run and then decide later, having established a level of airway control using a reasonable limited regimen, whether to add back ace but even then being very careful to observe the pt for worsening airway control and number of meds used/ needed to control symptoms.  in his case would not rechallenge but find an arb or  rx with Bystolic, the most beta -1  selective Beta blocker available in sample form, with bisoprolol the most selective generic choice  on the market.   Medications Added to Medication List This Visit: 1)  Ibuprofen 600 Mg Tabs (Ibuprofen) .... Take with meals as needed for pain 2)  Alprazolam 1 Mg Tabs (Alprazolam) .... 1/2 to 2  daily as needed 3)  Prilosec Otc 20 Mg Tbec (Omeprazole magnesium) .... Take  one 30-60 min before first meal of the day  Other Orders: Est. Patient Level IV (19147) Misc. Referral (Misc. Ref)  Patient Instructions: 1)  Start prilosec 20 mg Take  one 30-60 min before first meal of the day and pepcid 20 mg one at bedtime 2)  GERD (REFLUX)  is a common cause of respiratory symptoms. It commonly presents without heartburn and can be treated with medication, but also with lifestyle changes including avoidance of late meals, excessive alcohol, smoking cessation, and avoid fatty foods, chocolate, peppermint, colas, red wine, and acidic juices such as orange juice. NO MINT OR MENTHOL  PRODUCTS SO NO COUGH DROPS  3)  USE SUGARLESS CANDY INSTEAD (jolley ranchers)  4)  NO OIL BASED VITAMINS  5)  Needs sleep doctor consult next available  6)  LATE ADD chart review indicates sinus ct not done - if true it needs to be sceduled now

## 2010-08-11 NOTE — Progress Notes (Signed)
Summary: MEDICATION > change benicar to micardis 40/12.5   Phone Note From Pharmacy Call back at [phone] REF [phone]   Caller: Patient Call For: WERT Caller: [facility] Call For: WERT  Summary of Call: CALLING ABOUT ALTERNATIVE MED FOR BENICAR ( LOSARTAN HTZ  AND MICARDIS  HCT. Initial call taken by: Rickard Patience,  May 21, 2010 4:09 PM  Follow-up for Phone Call        benicar is not covered by pt insurance covered alternatives are Losartan HCTZ and Micardis HCT. Please advise. Carron Curie CMA  May 21, 2010 4:41 PM micardis 40/12.5 ok Follow-up by: Nyoka Cowden MD,  May 21, 2010 4:42 PM  Additional Follow-up for Phone Call Additional follow up Details #1::        LMTCbx1 with the pt to advise of the change before I call into [facility]. Carron Curie CMA  May 21, 2010 4:52 PM  Returning call can be reached at [phone] or [phone].Darletta Moll  May 22, 2010 8:23 AM  PAtient is aware of the change from Benicar to Micardis. Tried to call [facility] but they are not open until 9am. Will call them back at that time..   Additional Follow-up by: Michel Bickers CMA,  May 22, 2010 8:52 AM    Additional Follow-up for Phone Call Additional follow up Details #2::    [facility] informed of med change from Benuicar HCT to Micardis HCT per MW. Med list updated to reflect this change. Follow-up by: Michel Bickers CMA,  May 22, 2010 9:16 AM  New/Updated Medications: MICARDIS HCT 40-12.5 MG TABS (TELMISARTAN-HCTZ) 1 by mouth daily Prescriptions: MICARDIS HCT 40-12.5 MG TABS (TELMISARTAN-HCTZ) 1 by mouth daily  #90 x 4   Entered by:   Michel Bickers CMA   Authorized by:   Nyoka Cowden MD   Signed by:   Michel Bickers CMA on 05/22/2010   Method used:   Telephoned to ...       [facility] (mail-order)             , [place]         Ph: [phone]       Fax: [phone]   RxID:   [id number]

## 2010-08-13 NOTE — Miscellaneous (Signed)
Summary: auto shows mask leaks, optimal pressure 8cm.  Clinical Lists Changes  Orders: Added new Referral order of DME Referral (DME) - Signed auto download shows excellent compliance, but had significant leak Optimal pressure 8cm.

## 2010-08-31 ENCOUNTER — Ambulatory Visit: Payer: Self-pay | Admitting: Pulmonary Disease

## 2010-10-20 ENCOUNTER — Telehealth: Payer: Self-pay | Admitting: Internal Medicine

## 2010-10-20 NOTE — Telephone Encounter (Signed)
[email or web address]  

## 2010-10-20 NOTE — Telephone Encounter (Signed)
Pt states Dr. Sherene Sires had changed his BP med to Losartan HCT but Dr. Sherene Sires only follows this patient for pulmonary issues. Dr. Perrin Maltese is pt's PCP and any refills for BP meds should come from his office. Pt is aware and will contact Dr. Leodis [place] office.

## 2010-11-24 ENCOUNTER — Encounter: Payer: Self-pay | Admitting: Pulmonary Disease

## 2010-11-27 ENCOUNTER — Encounter: Payer: Self-pay | Admitting: Pulmonary Disease

## 2010-11-27 ENCOUNTER — Ambulatory Visit (INDEPENDENT_AMBULATORY_CARE_PROVIDER_SITE_OTHER): Payer: BC Managed Care – PPO | Admitting: Pulmonary Disease

## 2010-11-27 ENCOUNTER — Ambulatory Visit: Payer: Self-pay | Admitting: Pulmonary Disease

## 2010-11-27 VITALS — BP 120/76 | HR 84 | Temp 98.5°F | Ht 74.0 in | Wt 300.4 lb

## 2010-11-27 DIAGNOSIS — G4733 Obstructive sleep apnea (adult) (pediatric): Secondary | ICD-10-CM

## 2010-11-27 NOTE — Assessment & Plan Note (Signed)
The pt is wearing cpap 4hrs a night, but that is the whole time he is sleeping!!  Many nights are less than that due to his working excessive hours.  He never is rested, but this is more than likely a reflection of his poor sleep hygiene.  He denies issues with mouth opening.  I have stressed the importance of getting more sleep, and to wear cpap during this time.  I have reminded him of his moral responsibility to not drive while he is sleepy, and to work on weight loss as well.

## 2010-11-27 NOTE — Discharge Summary (Signed)
Matthew Hutchinson, SATZ NO.:  [id number]   MEDICAL RECORD NO.:  [id number]          PATIENT TYPE:  INP   LOCATION:  3310                         FACILITY:  [facility]   PHYSICIAN:  Dani Gobble, MD       DATE OF BIRTH:  07/30/1960   DATE OF ADMISSION:  03/19/2008  DATE OF DISCHARGE:  03/20/2008                               DISCHARGE SUMMARY   DISCHARGE DIAGNOSES:  1. Chest pain.      a.     Atypical chest pain not felt to be cardiac with negative       myocardial infarction.      b.     Musculoskeletal chest pain.  2. History of nonobstructive coronary artery disease and normal      ejection fraction.  Please note cardiac cath was in 2006.  3. Obstructive sleep apnea.  4. Hypertension.  5. Hyperlipidemia.  6. 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   DISCHARGE CONDITION:  Improved.   PROCEDURES:  None.   DISCHARGE MEDICATIONS:  1. Aspirin 81 mg daily.  2. Lisinopril/hydrochlorothiazide 10/12.5 daily.  3. Gabapentin 900 mg three times a day.  4. Simvastatin 20 mg daily.  5. Ambien at bedtime as needed.  6. Ultram 50 mg as needed up to every 8 hours.  7. Nitroglycerin sublingual as needed.  8. Alprazolam 0.5 mg one-half to 2 tablets as needed.  9. Multivitamins daily.  10.Garlic 100 mg daily.  11.Glucosamine HCl 1500 mg three times a day.  12.Chondroitin sulfate 1200 mg three times a day.  13.Methyl succinate __________  500 mg three times a day.  14.Lutein 40 mg daily.  15.Calcium 500 mg daily.  16.Magnesium 500 mg daily.  17.Potassium 99 mg over the counter daily.  18.Ibuprofen 600 mg one every 8 hours x3 days, then every 8 hours as      needed for chest wall pain.  19.Norvasc 5 mg one daily for improved blood pressure control.   DISCHARGE INSTRUCTIONS:  1. May return to work on March 25, 2008.  2. Low-sodium, heart-healthy diet.  3. Activity without restrictions but no lifting for 4 days and      increase activity slowly.  Follow up  with Dr. Allyson Sabal on April 10, 2008, at 9:15 a.m.  4. The office will call you with date and time for sleep study.   HISTORY OF PRESENT ILLNESS:  A 50 year old white male with a history of  nonobstructive coronary artery disease per cardiac cath in 2006, who  came to the emergency room on March 19, 2008, after presenting to  Urgent Care with chest pain.  He had had chest pain for about 24 hours  from his upper waist to just below his neck.  No associated symptoms of  shortness of breath, radiation, or diaphoresis.  This worsened with  lying down and upper body movements.   PAST MEDICAL HISTORY:  1. Coronary artery disease, nonobstructive.  2. Hypertension.  3. Hyperlipidemia.  4. Neuropathic pain secondary to leg trauma.  5. Low HDL.  6. Probable sleep apnea,  need study.   ALLERGIES:  PENICILLIN and DIOVAN.   FAMILY HISTORY/SOCIAL HISTORY/REVIEW OF SYSTEMS:  See H and P.   PHYSICAL EXAMINATION AT DISCHARGE:  Blood pressure 121/69, pulse 64,  respiration is 80, afebrile, and 98% oxygen.   EKG sinus rhythm.  No acute changes.   LABORATORY DATA:  Hemoglobin 15.1, hematocrit 44.3, WBC 5.8, platelets  178.  These remained stable.  Protime 13, INR of 1, PTT 25.   D-dimer 0.29.   CHEMISTRY:  Sodium 142, potassium 3.6, chloride 110, CO2 27, glucose 87,  BUN 8, creatinine 0.92, total cholesterol 103, triglycerides 123, HDL  38, LDL 40, TSH 0.652.   Chest x-ray:  Lungs were clear.  Heart was normal.  Mediastinum and  hilar are negative for adenopathy.  Stable chest x-ray.   EKG on admission, sinus rhythm rate of 99, no changes, normal EKG.  Follow-up on the 9th, sinus rhythm rate of 65, no acute changes.  There  was some artifact, but EKG itself was without changes.   The patient underwent lower extremity Dopplers negative for DVT.   The patient was admitted by Dr. Domingo Sep for chest pain, it was felt to  be musculoskeletal, ruled out for an MI and was admitted  overnight.  It  was decided that he did need evaluation for obstructive sleep apnea.  He  was seen the  next morning continued with some chest pain atypical and was tender to  touch, we treated it with Motrin, ambulating without problems, and he  was ready for discharge to home.  He will follow up as an outpatient.  He had negative DVT as well.      Darcella Gasman. Annie Paras, N.P.    ______________________________  Dani Gobble, MD    LRI/MEDQ  D:  04/16/2008  T:  04/17/2008  Job:  [id number]   cc:   Gerlene Burdock A. Alanda Amass, M.D.  Jonita Albee, M.D.

## 2010-11-27 NOTE — Progress Notes (Signed)
  Subjective:    Patient ID: Matthew Hutchinson, male    DOB: January 24, 1961, 50 y.o.   MRN: [id number]  HPI The pt comes in today for f/u of his known osa.  He has been wearing cpap at 4hrs a night or less, and this is because that is all the sleep he is getting.  He is working 18-20hrs a day, and reports feeling tired and sleepy all of the time.  He feels he is tolerating the cpap well, and denies any issues with the nasal pillows.  He does not think he is mouth opening.    Review of Systems  Constitutional: Negative for fever and unexpected weight change.  HENT: Positive for congestion and sore throat. Negative for ear pain, nosebleeds, rhinorrhea, sneezing, trouble swallowing, dental problem, postnasal drip and sinus pressure.   Eyes: Negative for redness and itching.  Respiratory: Positive for cough, chest tightness and shortness of breath. Negative for wheezing.   Cardiovascular: Negative for palpitations and leg swelling.  Gastrointestinal: Negative for nausea and vomiting.  Genitourinary: Negative for dysuria.  Musculoskeletal: Positive for joint swelling.  Skin: Negative for rash.  Neurological: Negative for headaches.  Hematological: Does not bruise/bleed easily.  Psychiatric/Behavioral: Negative for dysphoric mood. The patient is not nervous/anxious.        Objective:   Physical Exam Ow male in nad No skin breakdown or pressure necrosis from cpap mask +mild edema, no cyanosis Alert, does not appear sleepy, moves all 4        Assessment & Plan:

## 2010-11-27 NOTE — Patient Instructions (Signed)
Stay on current cpap.  If you feel you are losing pressure from mouth opening, please call and we can arrange for chin strap Work on weight loss Try and increase total sleep time followup with me in one year.

## 2011-01-15 IMAGING — CT CT PARANASAL SINUSES LIMITED
1 of 2 series · 13 of 30 positions shown, 17 images · non-contrast
Comparison: [HOSPITAL] chest x-ray 03/20/2008 and
[HOSPITAL] at [REDACTED] CT 02/06/2010.

CLINICAL DATA: Cough, history sinusitis, shortness of breath, eye
pressure.

CT PARANASAL SINUS LIMITED WITHOUT CONTRAST
TECHNIQUE: Multidetector CT images of the paranasal sinuses were
obtained in a single plane without contrast.

[Series 3: ltd sinus 3.0 h30s · axial · 0.24mm/px · z∈[+1252,+1364]mm · 13 of 30 slices shown, 17 images]
[im 3/30  brain]
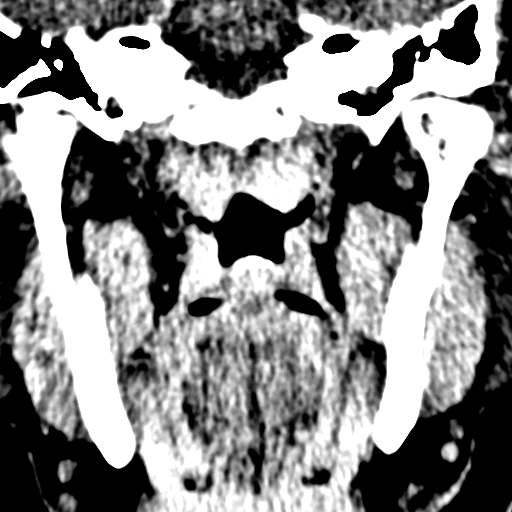
[im 3/30  bone]
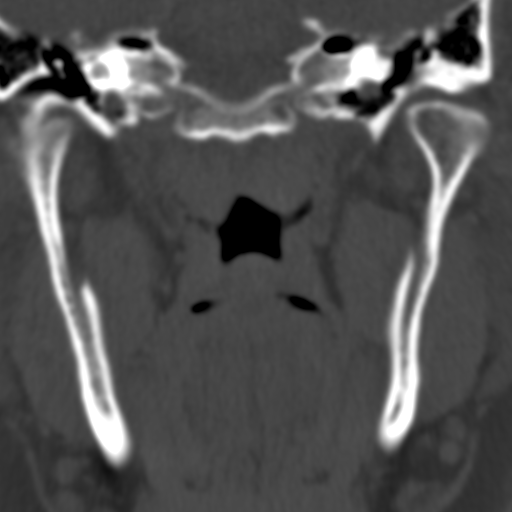
[im 5/30  bone]
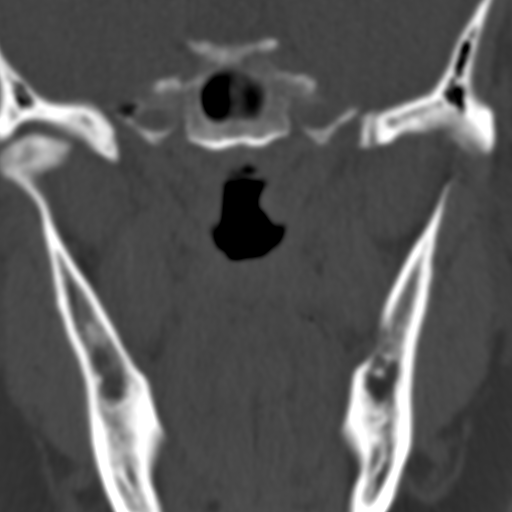
[im 7/30  bone]
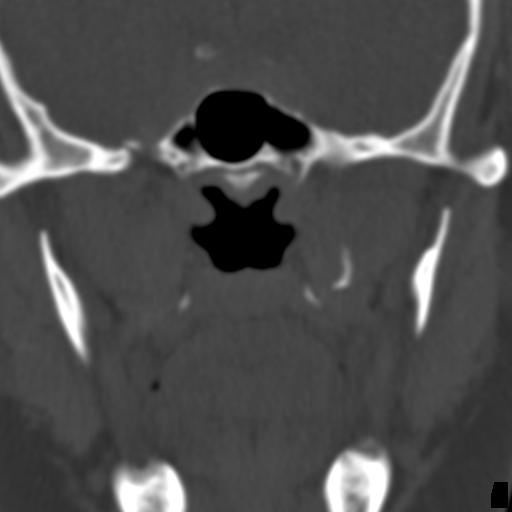
[im 9/30  bone]
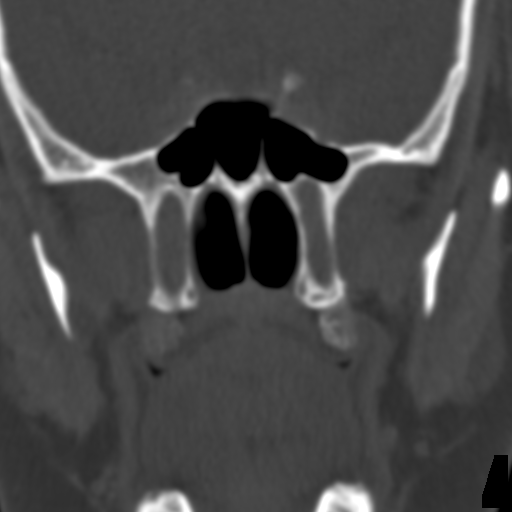
[im 11/30  brain]
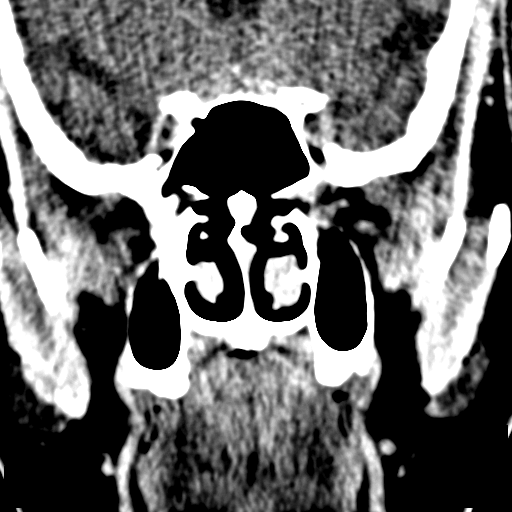
[im 11/30  bone]
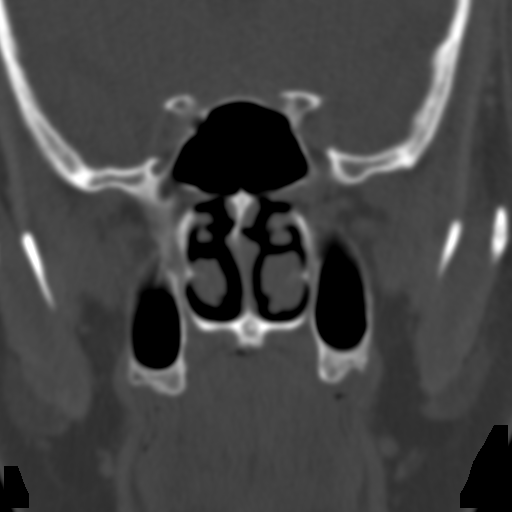
[im 13/30  bone]
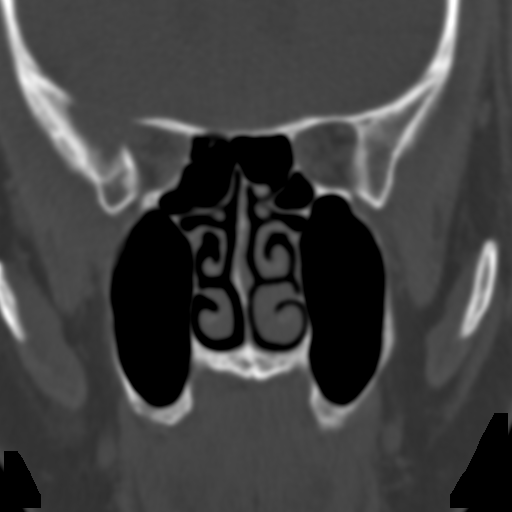
[im 15/30  bone]
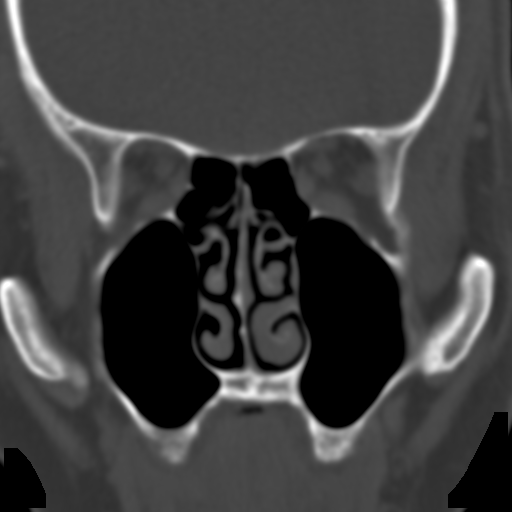
[im 17/30  bone]
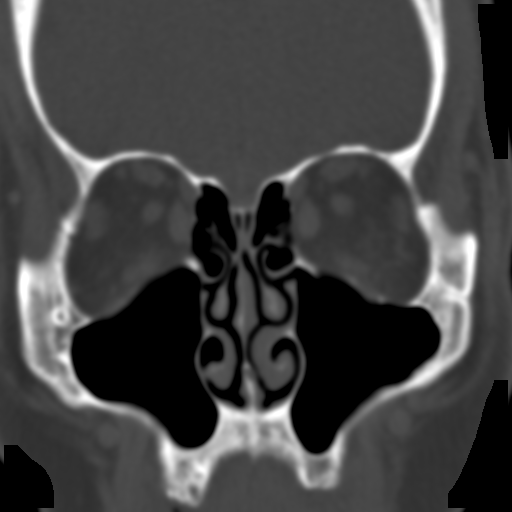
[im 19/30  brain]
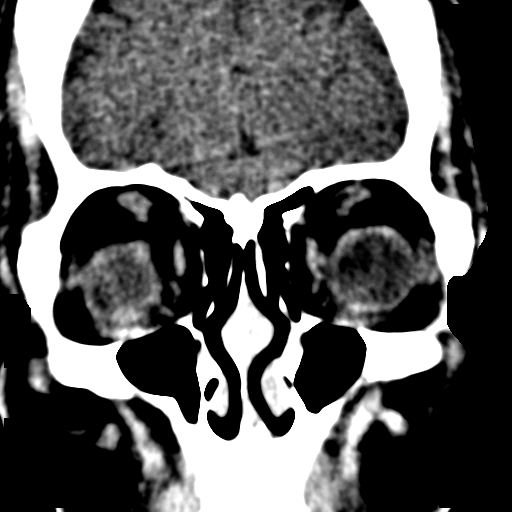
[im 19/30  bone]
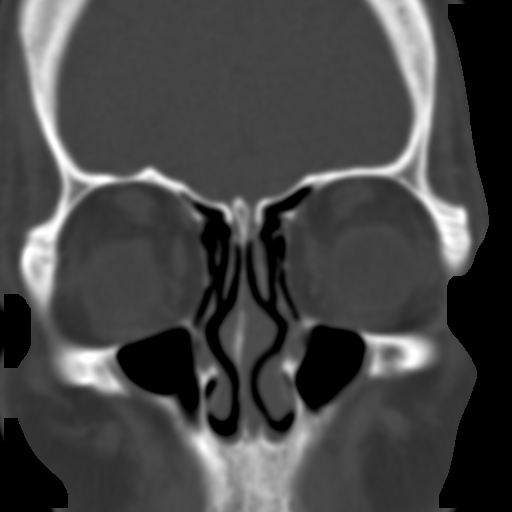
[im 21/30  bone]
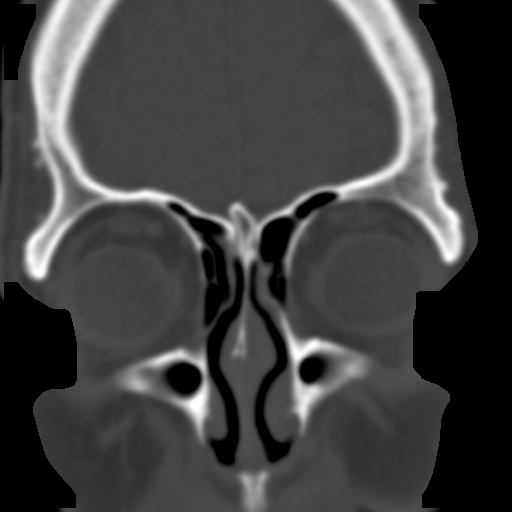
[im 23/30  bone]
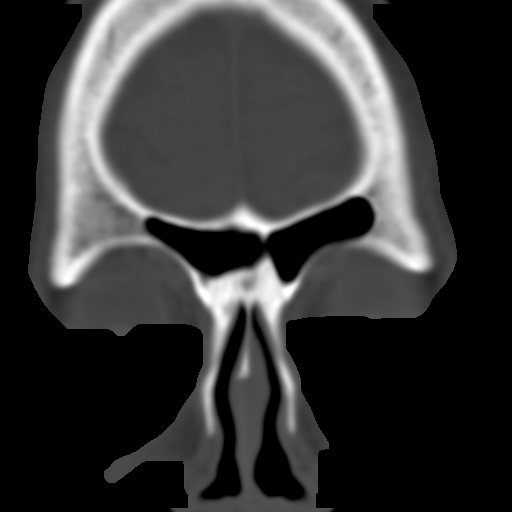
[im 25/30  bone]
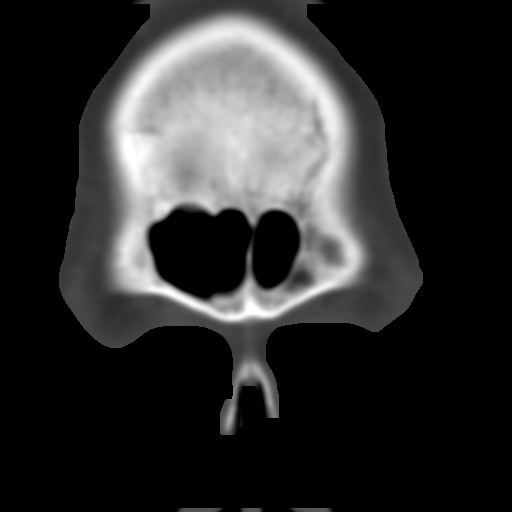
[im 27/30  brain]
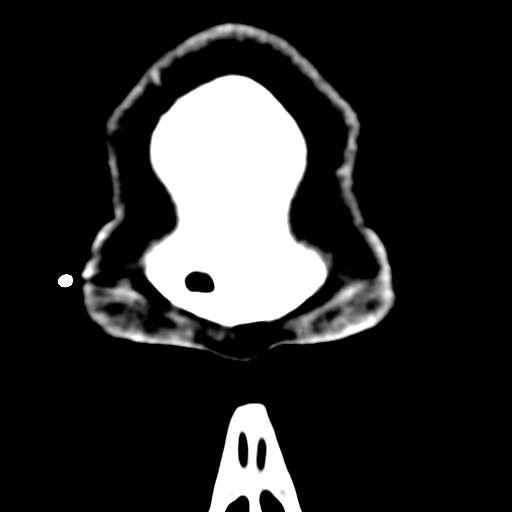
[im 27/30  bone]
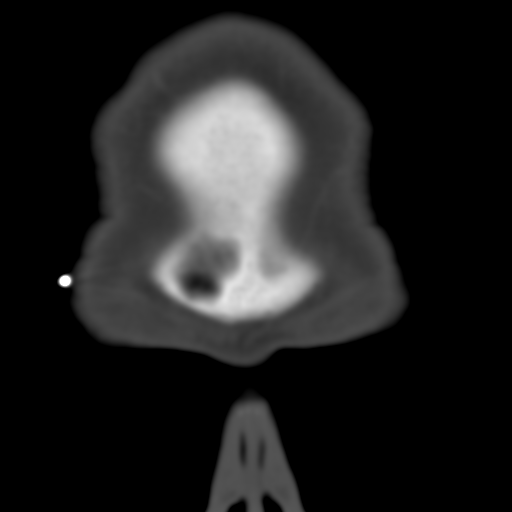

[13 of 30 positions shown; findings below may reference images not displayed]

FINDINGS: Paranasal sinuses appear clear.  Bilateral orbital
structures appear normal.  Visualized portions middle ear cavities
and mastoid air cells appear clear.  Slight 3 mm mucosal thickening
is seen at anterior inferior nasal cavity with slight inflammatory
enlargement left inferior nasal turbinate.
IMPRESSION: 1.  Slight anterior inferior rhinitis findings.
2.  Otherwise, negative - clear paranasal sinuses.

## 2011-04-14 LAB — APTT: aPTT: 25

## 2011-04-14 LAB — CBC
HCT: 43.8
Hemoglobin: 15.1
MCHC: 34.5
MCV: 87.8
Platelets: 175
Platelets: 177
Platelets: 178
RBC: 4.98
RDW: 13
RDW: 13.4
WBC: 5.8

## 2011-04-14 LAB — COMPREHENSIVE METABOLIC PANEL
ALT: 31
AST: 31
CO2: 27
Calcium: 9.3
Chloride: 110
GFR calc Af Amer: >60
GFR calc non Af Amer: >60
Glucose, Bld: 87
Sodium: 142
Total Bilirubin: 0.8

## 2011-04-14 LAB — LIPID PANEL
Cholesterol: 103
HDL: 38 — ABNORMAL LOW
Triglycerides: 123

## 2011-04-14 LAB — DIFFERENTIAL
Basophils Absolute: 0
Basophils Relative: 1
Lymphocytes Relative: 22
Monocytes Absolute: 0.4
Neutro Abs: 3.8
Neutrophils Relative %: 67

## 2011-04-14 LAB — POCT I-STAT, CHEM 8
Chloride: 108
HCT: 44
Hemoglobin: 15
Potassium: 3.6
Sodium: 142

## 2011-04-14 LAB — PROTIME-INR: Prothrombin Time: 13

## 2011-04-14 LAB — MAGNESIUM: Magnesium: 1.9

## 2011-04-14 LAB — TSH: TSH: 0.652

## 2011-04-14 LAB — POCT CARDIAC MARKERS
CKMB, poc: 1.2
Myoglobin, poc: 139
Troponin i, poc: <0.05

## 2011-04-14 LAB — CARDIAC PANEL(CRET KIN+CKTOT+MB+TROPI)
Relative Index: 1.8
Relative Index: 1.8
Total CK: 111
Troponin I: 0.01
Troponin I: <0.01

## 2011-04-14 LAB — D-DIMER, QUANTITATIVE: D-Dimer, Quant: 0.29

## 2011-04-14 LAB — TROPONIN I: Troponin I: <0.01

## 2011-04-14 LAB — CK TOTAL AND CKMB (NOT AT ARMC): Total CK: 111

## 2011-06-21 ENCOUNTER — Ambulatory Visit: Payer: Worker's Compensation

## 2011-06-29 ENCOUNTER — Other Ambulatory Visit: Payer: Self-pay | Admitting: Internal Medicine

## 2011-06-29 MED ORDER — LOSARTAN POTASSIUM-HCTZ 50-12.5 MG PO TABS: 1.0000 | ORAL_TABLET | Freq: Every day | ORAL | Status: DC

## 2011-06-29 NOTE — Telephone Encounter (Signed)
Last ov with MW 05/2010.  Refills sent to mail order pharmacy.

## 2011-07-19 ENCOUNTER — Other Ambulatory Visit: Payer: Self-pay | Admitting: Allergy

## 2011-09-06 ENCOUNTER — Other Ambulatory Visit: Payer: Self-pay | Admitting: Family Medicine

## 2011-09-06 MED ORDER — ARMODAFINIL 150 MG PO TABS: 150.0000 mg | ORAL_TABLET | Freq: Every day | ORAL | Status: DC

## 2011-09-21 ENCOUNTER — Other Ambulatory Visit: Payer: Self-pay | Admitting: Family Medicine

## 2011-09-21 MED ORDER — ALPRAZOLAM 1 MG PO TABS: ORAL_TABLET | ORAL | Status: DC

## 2011-09-27 ENCOUNTER — Ambulatory Visit (INDEPENDENT_AMBULATORY_CARE_PROVIDER_SITE_OTHER): Payer: BC Managed Care – PPO | Admitting: Internal Medicine

## 2011-09-27 ENCOUNTER — Encounter: Payer: Self-pay | Admitting: Internal Medicine

## 2011-09-27 VITALS — BP 142/86 | HR 83 | Temp 96.8°F | Resp 16 | Ht 72.0 in | Wt 307.2 lb

## 2011-09-27 DIAGNOSIS — E785 Hyperlipidemia, unspecified: Secondary | ICD-10-CM

## 2011-09-27 DIAGNOSIS — Z79899 Other long term (current) drug therapy: Secondary | ICD-10-CM

## 2011-09-27 DIAGNOSIS — Z0000 Encounter for general adult medical examination without abnormal findings: Secondary | ICD-10-CM

## 2011-09-27 DIAGNOSIS — Z566 Other physical and mental strain related to work: Secondary | ICD-10-CM

## 2011-09-27 DIAGNOSIS — R0989 Other specified symptoms and signs involving the circulatory and respiratory systems: Secondary | ICD-10-CM

## 2011-09-27 DIAGNOSIS — I209 Angina pectoris, unspecified: Secondary | ICD-10-CM | POA: Insufficient documentation

## 2011-09-27 DIAGNOSIS — I10 Essential (primary) hypertension: Secondary | ICD-10-CM

## 2011-09-27 DIAGNOSIS — G4733 Obstructive sleep apnea (adult) (pediatric): Secondary | ICD-10-CM

## 2011-09-27 LAB — POCT URINALYSIS DIPSTICK
Blood, UA: NEGATIVE
Glucose, UA: NEGATIVE
Protein, UA: NEGATIVE
Spec Grav, UA: 1.025
Urobilinogen, UA: 0.2
pH, UA: 5.5

## 2011-09-27 MED ORDER — ALPRAZOLAM 1 MG PO TABS: ORAL_TABLET | ORAL | Status: DC

## 2011-09-27 NOTE — Progress Notes (Signed)
  Subjective:    Patient ID: Matthew Hutchinson, male    DOB: 17-Jun-1961, 51 y.o.   MRN: [id number]  HPI See problem list, all stable See scanned hx   Review of Systems See scanned ROS   Objective:   Physical Exam  Constitutional: He is oriented to person, place, and time. He appears well-developed and well-nourished.  HENT:  Right Ear: External ear normal.  Left Ear: External ear normal.  Nose: Nose normal.  Mouth/Throat: Oropharynx is clear and moist.  Eyes: EOM are normal. Pupils are equal, round, and reactive to light.  Neck: Normal range of motion. Neck supple. No tracheal deviation present. No thyromegaly present.  Cardiovascular: Normal rate, regular rhythm and normal heart sounds.   Pulmonary/Chest: Effort normal and breath sounds normal.  Abdominal: Soft. Bowel sounds are normal.  Genitourinary: Rectum normal and prostate normal.  Musculoskeletal: Normal range of motion. He exhibits tenderness.  Lymphadenopathy:    He has no cervical adenopathy.  Neurological: He is alert and oriented to person, place, and time. He has normal reflexes. He displays normal reflexes. No cranial nerve deficit. He exhibits normal muscle tone. Coordination abnormal.  Skin: Skin is warm and dry.  Psychiatric: He has a normal mood and affect.   Results for orders placed in visit on 09/27/11  POCT URINALYSIS DIPSTICK      Component Value Range   Color, UA yellow     Clarity, UA clear     Glucose, UA neg     Bilirubin, UA neg     Ketones, UA neg     Spec Grav, UA 1.025     Blood, UA neg     pH, UA 5.5     Protein, UA neg     Urobilinogen, UA 0.2     Nitrite, UA neg     Leukocytes, UA Negative            Assessment & Plan:  Refill all meds one year See scanned Rxs

## 2011-09-28 LAB — CBC WITH DIFFERENTIAL/PLATELET
Basophils Absolute: 0 10*3/uL (ref 0.0–0.1)
Basophils Relative: 1 % (ref 0–1)
Eosinophils Relative: 3 % (ref 0–5)
HCT: 46 % (ref 39.0–52.0)
Lymphocytes Relative: 29 % (ref 12–46)
MCHC: 35.2 g/dL (ref 30.0–36.0)
MCV: 85.3 fL (ref 78.0–100.0)
Monocytes Absolute: 0.4 10*3/uL (ref 0.1–1.0)
Neutro Abs: 3.6 10*3/uL (ref 1.7–7.7)
Platelets: 206 10*3/uL (ref 150–400)
RDW: 13.1 % (ref 11.5–15.5)
WBC: 5.8 10*3/uL (ref 4.0–10.5)

## 2011-09-28 LAB — COMPREHENSIVE METABOLIC PANEL
ALT: 24 U/L (ref 0–53)
AST: 29 U/L (ref 0–37)
Alkaline Phosphatase: 94 U/L (ref 39–117)
BUN: 12 mg/dL (ref 6–23)
Creat: 0.95 mg/dL (ref 0.50–1.35)
Total Bilirubin: 0.9 mg/dL (ref 0.3–1.2)

## 2011-09-28 LAB — LIPID PANEL
HDL: 49 mg/dL (ref >39)
LDL Cholesterol: 84 mg/dL (ref 0–99)
Total CHOL/HDL Ratio: 3.1 Ratio
Triglycerides: 102 mg/dL (ref <150)
VLDL: 20 mg/dL (ref 0–40)

## 2011-09-28 LAB — TSH: TSH: 0.687 u[IU]/mL (ref 0.350–4.500)

## 2011-09-29 ENCOUNTER — Encounter: Payer: Self-pay | Admitting: *Deleted

## 2011-10-01 NOTE — Progress Notes (Signed)
Addended by: Johnnette Litter on: 10/01/2011 06:02 PM   Modules accepted: Orders

## 2011-10-22 ENCOUNTER — Ambulatory Visit (INDEPENDENT_AMBULATORY_CARE_PROVIDER_SITE_OTHER): Payer: BC Managed Care – PPO | Admitting: Internal Medicine

## 2011-10-22 VITALS — BP 160/85 | HR 94 | Temp 98.3°F | Resp 18 | Ht 74.0 in | Wt 303.0 lb

## 2011-10-22 DIAGNOSIS — R059 Cough, unspecified: Secondary | ICD-10-CM

## 2011-10-22 DIAGNOSIS — R05 Cough: Secondary | ICD-10-CM

## 2011-10-22 DIAGNOSIS — J019 Acute sinusitis, unspecified: Secondary | ICD-10-CM

## 2011-10-22 DIAGNOSIS — R5381 Other malaise: Secondary | ICD-10-CM

## 2011-10-22 DIAGNOSIS — R55 Syncope and collapse: Secondary | ICD-10-CM

## 2011-10-22 DIAGNOSIS — R5383 Other fatigue: Secondary | ICD-10-CM

## 2011-10-22 MED ORDER — AZITHROMYCIN 250 MG PO TABS: ORAL_TABLET | ORAL | Status: DC

## 2011-10-22 MED ORDER — FLUTICASONE PROPIONATE 50 MCG/ACT NA SUSP: 2.0000 | Freq: Every day | NASAL | Status: DC

## 2011-10-22 MED ORDER — BENZONATATE 100 MG PO CAPS: 100.0000 mg | ORAL_CAPSULE | Freq: Three times a day (TID) | ORAL | Status: DC | PRN
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
## 2011-11-19 ENCOUNTER — Ambulatory Visit: Payer: Self-pay | Admitting: Pulmonary Disease

## 2012-01-03 ENCOUNTER — Encounter: Payer: Self-pay | Admitting: Pulmonary Disease

## 2012-01-03 ENCOUNTER — Ambulatory Visit (INDEPENDENT_AMBULATORY_CARE_PROVIDER_SITE_OTHER): Payer: BC Managed Care – PPO | Admitting: Pulmonary Disease

## 2012-01-03 VITALS — BP 124/88 | HR 96 | Temp 97.8°F | Ht 74.5 in | Wt 318.2 lb

## 2012-01-03 DIAGNOSIS — G4733 Obstructive sleep apnea (adult) (pediatric): Secondary | ICD-10-CM

## 2012-01-03 NOTE — Assessment & Plan Note (Signed)
The patient has been compliant with CPAP, but continues to have fatigue and sleepiness because of his long work hours and also chronic sleep deprivation.  I've asked him to stay on CPAP consistently, and to try and get 6-8 hours of sleep a night if possible.  He is also overdue for new supplies and a mask, and I've given him a prescription for this.  I have also asked him to work aggressively on weight loss.

## 2012-01-03 NOTE — Patient Instructions (Addendum)
Stay on cpap during sleep, keep up with mask changes and supplies. Work on weight loss followup with me in one year.

## 2012-01-03 NOTE — Progress Notes (Signed)
  Subjective:    Patient ID: Matthew Hutchinson, male    DOB: 1960-10-08, 51 y.o.   MRN: [id number]  HPI The patient comes in today for followup of his known obstructive sleep apnea.  He is wearing CPAP compliantly, but is still only getting very short hours of sleep because of work.  He remains tired during his waking hours, but he clearly has significant chronic sleep deprivation because of his work schedule.  He has also gained 18 pounds since last visit.   Review of Systems  Constitutional: Negative.  Negative for fever and unexpected weight change.  HENT: Positive for congestion, sneezing and trouble swallowing. Negative for ear pain, nosebleeds, sore throat, rhinorrhea, dental problem, postnasal drip and sinus pressure.   Eyes: Negative for redness and itching.  Respiratory: Positive for shortness of breath. Negative for cough, chest tightness and wheezing.   Cardiovascular: Positive for leg swelling. Negative for palpitations.  Gastrointestinal: Negative for nausea and vomiting.  Genitourinary: Negative for dysuria.  Musculoskeletal: Negative for joint swelling.  Skin: Negative for rash.  Neurological: Negative for headaches.  Hematological: Does not bruise/bleed easily.  Psychiatric/Behavioral: Negative for dysphoric mood. The patient is not nervous/anxious.   All other systems reviewed and are negative.       Objective:   Physical Exam Obese male in no acute distress No skin breakdown or pressure necrosis from the CPAP mask Lower extremities without edema, no cyanosis Alert, oriented, does not appear to be sleepy, moves all 4 extremities.       Assessment & Plan:

## 2012-01-05 ENCOUNTER — Telehealth: Payer: Self-pay

## 2012-01-05 DIAGNOSIS — Z566 Other physical and mental strain related to work: Secondary | ICD-10-CM

## 2012-01-05 NOTE — Telephone Encounter (Signed)
On 09/27/11 he received a prescription for 180 with 3 refills. This should be enough for a year. He cannot have a refill at this time.

## 2012-01-05 NOTE — Telephone Encounter (Signed)
Patient states that [facility] sent over request for generic Xanax. Pt would like [place] to f/u on this and call him to let him know status at [phone].

## 2012-01-06 MED ORDER — ALPRAZOLAM 1 MG PO TABS: ORAL_TABLET | ORAL | Status: DC

## 2012-01-06 NOTE — Telephone Encounter (Signed)
LMOM to call back

## 2012-01-06 NOTE — Telephone Encounter (Signed)
Pt CB and reported he called [facility] and was told that they didn't receive the RFs on the Rx written in March and asked if we can call them and give them Rx over the phone. Called [facility] and gave Rx for Xanax 1 mg as written by Dr Perrin Maltese in March. I was only able to give them #180 plus 1 RF even though Dr Ernestene Mention order would have allowed for add'l two RFs d/t DEA rule that only 6 mos of controlled subst can be Rxd before another new Rx is needed. Changing Rx in [facility] for the record.

## 2012-01-06 NOTE — Telephone Encounter (Signed)
Spoke with patient and let him know that he should have three refills left that should last until March 2014.  Patient will contact [facility] and see where the mixup was.

## 2012-02-21 ENCOUNTER — Ambulatory Visit (INDEPENDENT_AMBULATORY_CARE_PROVIDER_SITE_OTHER): Payer: BC Managed Care – PPO | Admitting: Internal Medicine

## 2012-02-21 ENCOUNTER — Encounter: Payer: Self-pay | Admitting: Internal Medicine

## 2012-02-21 VITALS — BP 120/74 | HR 81 | Temp 98.2°F | Resp 16 | Ht 73.0 in | Wt 313.8 lb

## 2012-02-21 DIAGNOSIS — J449 Chronic obstructive pulmonary disease, unspecified: Secondary | ICD-10-CM

## 2012-02-21 DIAGNOSIS — Z7189 Other specified counseling: Secondary | ICD-10-CM

## 2012-02-21 DIAGNOSIS — E789 Disorder of lipoprotein metabolism, unspecified: Secondary | ICD-10-CM

## 2012-02-21 DIAGNOSIS — I2510 Atherosclerotic heart disease of native coronary artery without angina pectoris: Secondary | ICD-10-CM

## 2012-02-21 DIAGNOSIS — I10 Essential (primary) hypertension: Secondary | ICD-10-CM

## 2012-02-21 DIAGNOSIS — E1169 Type 2 diabetes mellitus with other specified complication: Secondary | ICD-10-CM | POA: Insufficient documentation

## 2012-02-21 DIAGNOSIS — G4733 Obstructive sleep apnea (adult) (pediatric): Secondary | ICD-10-CM

## 2012-02-21 DIAGNOSIS — Z79899 Other long term (current) drug therapy: Secondary | ICD-10-CM

## 2012-02-21 LAB — COMPREHENSIVE METABOLIC PANEL
BUN: 13 mg/dL (ref 6–23)
CO2: 24 mEq/L (ref 19–32)
Glucose, Bld: 107 mg/dL — ABNORMAL HIGH (ref 70–99)
Sodium: 138 mEq/L (ref 135–145)
Total Bilirubin: 0.6 mg/dL (ref 0.3–1.2)
Total Protein: 7 g/dL (ref 6.0–8.3)

## 2012-02-21 NOTE — Progress Notes (Signed)
  Subjective:    Patient ID: Matthew Hutchinson, male    DOB: [birth year], 51 y.o.   MRN: [id number]  HPI Doing well. See problem list Review of Systems     Objective:   Physical Exam  Constitutional: He is oriented to person, place, and time. He appears well-developed.  Eyes: EOM are normal.  Cardiovascular: Normal rate, regular rhythm and normal heart sounds.   Pulmonary/Chest: Effort normal and breath sounds normal.  Musculoskeletal: He exhibits tenderness.  Neurological: He is alert and oriented to person, place, and time.  Skin: Skin is warm and dry.  Psychiatric: He has a normal mood and affect.     cmet    Assessment & Plan:  rf meds 1 yr

## 2012-02-22 ENCOUNTER — Telehealth: Payer: Self-pay | Admitting: Radiology

## 2012-02-22 MED ORDER — SIMVASTATIN 20 MG PO TABS: 20.0000 mg | ORAL_TABLET | Freq: Every day | ORAL | Status: DC

## 2012-02-22 MED ORDER — GABAPENTIN 300 MG PO CAPS: 300.0000 mg | ORAL_CAPSULE | Freq: Three times a day (TID) | ORAL | Status: DC

## 2012-02-22 NOTE — Telephone Encounter (Signed)
Sent in for 6 mo supply per Alycia Rossetti

## 2012-02-23 ENCOUNTER — Encounter: Payer: Self-pay | Admitting: *Deleted

## 2012-02-24 ENCOUNTER — Other Ambulatory Visit: Payer: Self-pay | Admitting: *Deleted

## 2012-02-24 MED ORDER — GABAPENTIN 300 MG PO CAPS: 300.0000 mg | ORAL_CAPSULE | Freq: Three times a day (TID) | ORAL | Status: DC

## 2012-02-25 ENCOUNTER — Telehealth: Payer: Self-pay

## 2012-02-25 NOTE — Telephone Encounter (Signed)
Noted  

## 2012-02-25 NOTE — Telephone Encounter (Signed)
Pt states per his most recent ov we are to stop trying to refill all of his meds. States he discussed with dr guest that he can stop taking all meds on his list. He keeps getting notifications from [facility] that we are trying to refill them.  Questions: pt best is [phone]  bf

## 2012-04-22 ENCOUNTER — Telehealth: Payer: Self-pay

## 2012-04-22 NOTE — Telephone Encounter (Signed)
Would like Ambien renewed to [facility] please advise.

## 2012-04-22 NOTE — Telephone Encounter (Signed)
Pt is calling to get his rx changed from a local pharmacy to a mail order. States all have been switch to [facility], except for the generic for ambien. Pt states it will need to be done by a DR not a PA. Please call pt to advise

## 2012-04-23 NOTE — Telephone Encounter (Signed)
Dr. Perrin Maltese please do for patient

## 2012-04-26 ENCOUNTER — Ambulatory Visit (INDEPENDENT_AMBULATORY_CARE_PROVIDER_SITE_OTHER): Payer: BC Managed Care – PPO | Admitting: Family Medicine

## 2012-04-26 VITALS — BP 144/67 | HR 83 | Temp 98.3°F | Resp 20 | Ht 73.0 in | Wt 312.0 lb

## 2012-04-26 DIAGNOSIS — I10 Essential (primary) hypertension: Secondary | ICD-10-CM

## 2012-04-26 DIAGNOSIS — G4700 Insomnia, unspecified: Secondary | ICD-10-CM

## 2012-04-26 DIAGNOSIS — G8921 Chronic pain due to trauma: Secondary | ICD-10-CM

## 2012-04-26 DIAGNOSIS — G8929 Other chronic pain: Secondary | ICD-10-CM

## 2012-04-26 DIAGNOSIS — Z23 Encounter for immunization: Secondary | ICD-10-CM

## 2012-04-26 DIAGNOSIS — E785 Hyperlipidemia, unspecified: Secondary | ICD-10-CM

## 2012-04-26 DIAGNOSIS — Z0000 Encounter for general adult medical examination without abnormal findings: Secondary | ICD-10-CM

## 2012-04-26 MED ORDER — GABAPENTIN 300 MG PO CAPS: 300.0000 mg | ORAL_CAPSULE | Freq: Three times a day (TID) | ORAL | Status: DC

## 2012-04-26 MED ORDER — SIMVASTATIN 20 MG PO TABS: 20.0000 mg | ORAL_TABLET | Freq: Every day | ORAL | Status: DC

## 2012-04-26 MED ORDER — LOSARTAN POTASSIUM-HCTZ 50-12.5 MG PO TABS: 1.0000 | ORAL_TABLET | Freq: Every day | ORAL | Status: DC

## 2012-04-26 MED ORDER — ZOLPIDEM TARTRATE 10 MG PO TABS: 10.0000 mg | ORAL_TABLET | Freq: Every evening | ORAL | Status: DC | PRN

## 2012-04-26 MED ORDER — INFLUENZA VIRUS VACC SPLIT PF IM SUSP: 0.5000 mL | INTRAMUSCULAR | Status: DC
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
## 2012-04-29 ENCOUNTER — Telehealth: Payer: Self-pay

## 2012-04-29 NOTE — Telephone Encounter (Signed)
PT CALLING IN HIS RX FOR NEURONTIN WAS DONE INCORRECTLY IS SHOULD BE 3TABS 3X A DAY STILL 300 MG 900 CAPSULES [phone]

## 2012-04-30 ENCOUNTER — Other Ambulatory Visit: Payer: Self-pay | Admitting: *Deleted

## 2012-04-30 MED ORDER — TRAMADOL HCL 50 MG PO TABS: ORAL_TABLET | ORAL | Status: DC

## 2012-04-30 NOTE — Telephone Encounter (Signed)
300 mg tid Neurontin #270,  refills

## 2012-04-30 NOTE — Telephone Encounter (Signed)
Dr. Milus Glazier, I am not is this correct? If so I am happy to send this in.

## 2012-05-02 ENCOUNTER — Other Ambulatory Visit: Payer: Self-pay | Admitting: *Deleted

## 2012-05-04 ENCOUNTER — Telehealth: Payer: Self-pay | Admitting: Family Medicine

## 2012-05-04 NOTE — Telephone Encounter (Signed)
Spoke with patient. dr guest wanted pt to come in to pick up hand written rx. Pt already got rx from Dr Bryn Gulling. He has previous msgs stating that someone is supposed to get back with him on his mail order of rx's. Mail order is [facility]. I threw written rx of ambien in shred bin.

## 2012-05-10 ENCOUNTER — Other Ambulatory Visit: Payer: Self-pay | Admitting: Family Medicine

## 2012-05-10 ENCOUNTER — Telehealth: Payer: Self-pay | Admitting: Family Medicine

## 2012-05-10 DIAGNOSIS — G8921 Chronic pain due to trauma: Secondary | ICD-10-CM

## 2012-05-10 MED ORDER — GABAPENTIN 300 MG PO CAPS: 300.0000 mg | ORAL_CAPSULE | Freq: Three times a day (TID) | ORAL | Status: DC

## 2012-05-10 NOTE — Telephone Encounter (Signed)
patient calling back because his Neurontin RX was wrong. It was #270 for 3 month supply and he states he takes Neurontin 300 mg  3 tabs tid so it should be #540 tablets. Send to [facility]. Please advise

## 2012-05-17 ENCOUNTER — Other Ambulatory Visit: Payer: Self-pay | Admitting: Radiology

## 2012-05-17 DIAGNOSIS — G8921 Chronic pain due to trauma: Secondary | ICD-10-CM

## 2012-05-17 MED ORDER — GABAPENTIN 300 MG PO CAPS: 900.0000 mg | ORAL_CAPSULE | Freq: Three times a day (TID) | ORAL | Status: DC

## 2012-05-17 MED ORDER — GABAPENTIN 300 MG PO CAPS: 300.0000 mg | ORAL_CAPSULE | Freq: Three times a day (TID) | ORAL | Status: DC

## 2012-06-17 ENCOUNTER — Other Ambulatory Visit: Payer: Self-pay

## 2012-06-17 DIAGNOSIS — G8921 Chronic pain due to trauma: Secondary | ICD-10-CM

## 2012-06-18 ENCOUNTER — Other Ambulatory Visit: Payer: Self-pay

## 2012-06-18 MED ORDER — ALPRAZOLAM 1 MG PO TABS: ORAL_TABLET | ORAL | Status: DC

## 2012-06-21 ENCOUNTER — Other Ambulatory Visit: Payer: Self-pay | Admitting: Physician Assistant

## 2012-06-21 DIAGNOSIS — G4733 Obstructive sleep apnea (adult) (pediatric): Secondary | ICD-10-CM

## 2012-06-21 NOTE — Telephone Encounter (Signed)
Since this is a controlled substance, I'm forwarding the refill request to you.  It appears that you prescribed this, but it's not on the patient's medication list. Perhaps you hand wrote it?  Your OVs in June and August say "see scanned Rxs" and  "Refill all meds one year"  Please order meds in [facility] in the future. Please ask if you need assistance.

## 2012-06-22 ENCOUNTER — Telehealth: Payer: Self-pay | Admitting: Internal Medicine

## 2012-06-22 NOTE — Telephone Encounter (Signed)
Called and spoke with [facility] and they are aware that these medications need to be filled by the pts primary care doctor.

## 2012-06-23 ENCOUNTER — Other Ambulatory Visit: Payer: Self-pay | Admitting: Internal Medicine

## 2012-06-23 NOTE — Telephone Encounter (Signed)
Msg was taken in error

## 2012-06-25 NOTE — Telephone Encounter (Signed)
Needs OFFICE VISIT FOR MORE.  

## 2012-06-26 ENCOUNTER — Telehealth: Payer: Self-pay | Admitting: Radiology

## 2012-06-26 NOTE — Telephone Encounter (Signed)
Patient advised.

## 2012-06-26 NOTE — Telephone Encounter (Signed)
Left message on machine for patient to call back.

## 2012-06-26 NOTE — Telephone Encounter (Signed)
Patient advised Dr Perrin Maltese wrote his nuvigil, this is faxed. He is aware he needs follow up for this.

## 2012-08-04 ENCOUNTER — Telehealth: Payer: Self-pay | Admitting: *Deleted

## 2012-08-04 NOTE — Telephone Encounter (Signed)
What is his follow up plan? He was advised last month he needed to follow up.

## 2012-08-04 NOTE — Telephone Encounter (Signed)
Pharmacy requesting refill on nuvigil 150mg .  Last filled 06/30/12

## 2012-08-07 NOTE — Telephone Encounter (Signed)
Called pt, he states Dr Perrin Maltese cancelled his appt on 08/28/12 and the earliest appt he could get is 10/16/2012.

## 2012-08-14 NOTE — Telephone Encounter (Signed)
Dr Perrin Maltese called, he advised me to call in Nuvigil for pt. Called in Rx to [facility] in [place].

## 2012-08-14 NOTE — Telephone Encounter (Signed)
Pick a day and see me early at 102 or check 104 for cancellation.

## 2012-08-28 ENCOUNTER — Encounter: Payer: Self-pay | Admitting: Internal Medicine

## 2012-09-06 ENCOUNTER — Telehealth: Payer: Self-pay | Admitting: Radiology

## 2012-09-06 DIAGNOSIS — G4700 Insomnia, unspecified: Secondary | ICD-10-CM

## 2012-09-06 NOTE — Telephone Encounter (Signed)
Please advise on Fax from [facility] for patients Ambien 10mg 

## 2012-09-13 ENCOUNTER — Telehealth: Payer: Self-pay | Admitting: Radiology

## 2012-09-13 NOTE — Telephone Encounter (Signed)
Please advise on Ambien Rx for patient requested form [facility]

## 2012-09-16 MED ORDER — ZOLPIDEM TARTRATE 10 MG PO TABS: 10.0000 mg | ORAL_TABLET | Freq: Every evening | ORAL | Status: DC | PRN

## 2012-09-16 NOTE — Telephone Encounter (Signed)
Called this in to [facility] for him. Advised him patient needed.

## 2012-09-16 NOTE — Telephone Encounter (Signed)
Okay to refill x 1 month in order to give patient time to come in

## 2012-09-16 NOTE — Telephone Encounter (Signed)
Amy, please address this.  If it needs to be printed and faxed, put the order in and print it.  Dr. Milus Glazier can sign it when he returns to the office.

## 2012-10-16 ENCOUNTER — Encounter: Payer: Self-pay | Admitting: Internal Medicine

## 2012-10-16 ENCOUNTER — Ambulatory Visit (INDEPENDENT_AMBULATORY_CARE_PROVIDER_SITE_OTHER): Payer: BC Managed Care – PPO | Admitting: Internal Medicine

## 2012-10-16 VITALS — BP 128/77 | HR 85 | Temp 97.7°F | Resp 16 | Ht 73.0 in | Wt 310.0 lb

## 2012-10-16 DIAGNOSIS — J441 Chronic obstructive pulmonary disease with (acute) exacerbation: Secondary | ICD-10-CM

## 2012-10-16 DIAGNOSIS — G4700 Insomnia, unspecified: Secondary | ICD-10-CM

## 2012-10-16 DIAGNOSIS — Z0000 Encounter for general adult medical examination without abnormal findings: Secondary | ICD-10-CM

## 2012-10-16 DIAGNOSIS — G894 Chronic pain syndrome: Secondary | ICD-10-CM

## 2012-10-16 DIAGNOSIS — I10 Essential (primary) hypertension: Secondary | ICD-10-CM

## 2012-10-16 DIAGNOSIS — G4733 Obstructive sleep apnea (adult) (pediatric): Secondary | ICD-10-CM

## 2012-10-16 DIAGNOSIS — E785 Hyperlipidemia, unspecified: Secondary | ICD-10-CM

## 2012-10-16 DIAGNOSIS — I25810 Atherosclerosis of coronary artery bypass graft(s) without angina pectoris: Secondary | ICD-10-CM

## 2012-10-16 LAB — COMPREHENSIVE METABOLIC PANEL
ALT: 23 U/L (ref 0–53)
AST: 24 U/L (ref 0–37)
Alkaline Phosphatase: 78 U/L (ref 39–117)
CO2: 23 mEq/L (ref 19–32)
Sodium: 135 mEq/L (ref 135–145)
Total Bilirubin: 0.9 mg/dL (ref 0.3–1.2)
Total Protein: 6.8 g/dL (ref 6.0–8.3)

## 2012-10-16 LAB — POCT URINALYSIS DIPSTICK
Bilirubin, UA: NEGATIVE
Glucose, UA: NEGATIVE
Leukocytes, UA: NEGATIVE
Nitrite, UA: NEGATIVE
Urobilinogen, UA: 0.2

## 2012-10-16 LAB — CBC WITH DIFFERENTIAL/PLATELET
Basophils Absolute: 0 10*3/uL (ref 0.0–0.1)
Basophils Relative: 1 % (ref 0–1)
Eosinophils Absolute: 0.2 10*3/uL (ref 0.0–0.7)
Lymphs Abs: 1.1 10*3/uL (ref 0.7–4.0)
MCH: 29.7 pg (ref 26.0–34.0)
Neutrophils Relative %: 59 % (ref 43–77)
Platelets: 169 10*3/uL (ref 150–400)
RBC: 5.45 MIL/uL (ref 4.22–5.81)
RDW: 14.1 % (ref 11.5–15.5)

## 2012-10-16 LAB — LIPID PANEL
HDL: 43 mg/dL (ref >39)
LDL Cholesterol: 86 mg/dL (ref 0–99)
Total CHOL/HDL Ratio: 3.3 Ratio
Triglycerides: 74 mg/dL (ref <150)
VLDL: 15 mg/dL (ref 0–40)

## 2012-10-16 LAB — PULMONARY FUNCTION TEST

## 2012-10-16 LAB — POCT UA - MICROSCOPIC ONLY
Casts, Ur, LPF, POC: NEGATIVE
WBC, Ur, HPF, POC: NEGATIVE
Yeast, UA: NEGATIVE

## 2012-10-16 MED ORDER — ARMODAFINIL 150 MG PO TABS: 150.0000 mg | ORAL_TABLET | Freq: Every day | ORAL | Status: DC

## 2012-10-16 MED ORDER — ALPRAZOLAM 1 MG PO TABS: ORAL_TABLET | ORAL | Status: DC

## 2012-10-16 MED ORDER — ZOLPIDEM TARTRATE 10 MG PO TABS: 10.0000 mg | ORAL_TABLET | Freq: Every evening | ORAL | Status: DC | PRN

## 2012-10-16 MED ORDER — TRAMADOL HCL 50 MG PO TABS: ORAL_TABLET | ORAL | Status: DC

## 2012-10-16 NOTE — Patient Instructions (Signed)
Stress Stress-related medical problems are becoming increasingly common. The body has a built-in physical response to stressful situations. Faced with pressure, challenge or danger, we need to react quickly. Our bodies release hormones such as cortisol and adrenaline to help do this. These hormones are part of the "fight or flight" response and affect the metabolic rate, heart rate and blood pressure, resulting in a heightened, stressed state that prepares the body for optimum performance in dealing with a stressful situation. It is likely that early man required these mechanisms to stay alive, but usually modern stresses do not call for this, and the same hormones released in today's world can damage health and reduce coping ability. CAUSES  Pressure to perform at work, at school or in sports.  Threats of physical violence.  Money worries.  Arguments.  Family conflicts.  Divorce or separation from significant other.  Bereavement.  New job or unemployment.  Changes in location.  Alcohol or drug abuse. SOMETIMES, THERE IS NO PARTICULAR REASON FOR DEVELOPING STRESS. Almost all people are at risk of being stressed at some time in their lives. It is important to know that some stress is temporary and some is long term.  Temporary stress will go away when a situation is resolved. Most people can cope with short periods of stress, and it can often be relieved by relaxing, taking a walk, chatting through issues with friends, or having a good night's sleep.  Chronic (long-term, continuous) stress is much harder to deal with. It can be psychologically and emotionally damaging. It can be harmful both for an individual and for friends and family. SYMPTOMS Everyone reacts to stress differently. There are some common effects that help [place] recognize it. In times of extreme stress, people may:  Shake uncontrollably.  Breathe faster and deeper than normal (hyperventilate).  Vomit.  For people  with asthma, stress can trigger an attack.  For some people, stress may trigger migraine headaches, ulcers, and body pain. PHYSICAL EFFECTS OF STRESS MAY INCLUDE:  Loss of energy.  Skin problems.  Aches and pains resulting from tense muscles, including neck ache, backache and tension headaches.  Increased pain from arthritis and other conditions.  Irregular heart beat (palpitations).  Periods of irritability or anger.  Apathy or depression.  Anxiety (feeling uptight or worrying).  Unusual behavior.  Loss of appetite.  Comfort eating.  Lack of concentration.  Loss of, or decreased, sex-drive.  Increased smoking, drinking, or recreational drug use.  For women, missed periods.  Ulcers, joint pain, and muscle pain. Post-traumatic stress is the stress caused by any serious accident, strong emotional damage, or extremely difficult or violent experience such as rape or war. Post-traumatic stress victims can experience mixtures of emotions such as fear, shame, depression, guilt or anger. It may include recurrent memories or images that may be haunting. These feelings can last for weeks, months or even years after the traumatic event that triggered them. Specialized treatment, possibly with medicines and psychological therapies, is available. If stress is causing physical symptoms, severe distress or making it difficult for you to function as normal, it is worth seeing your caregiver. It is important to remember that although stress is a usual part of life, extreme or prolonged stress can lead to other illnesses that will need treatment. It is better to visit a doctor sooner rather than later. Stress has been linked to the development of high blood pressure and heart disease, as well as insomnia and depression. There is no diagnostic test for  stress since everyone reacts to it differently. But a caregiver will be able to spot the physical symptoms, such  as:  Headaches.  Shingles.  Ulcers. Emotional distress such as intense worry, low mood or irritability should be detected when the doctor asks pertinent questions to identify any underlying problems that might be the cause. In case there are physical reasons for the symptoms, the doctor may also want to do some tests to exclude certain conditions. If you feel that you are suffering from stress, try to identify the aspects of your life that are causing it. Sometimes you may not be able to change or avoid them, but even a small change can have a positive ripple effect. A simple lifestyle change can make all the difference. STRATEGIES THAT CAN HELP DEAL WITH STRESS:  Delegating or sharing responsibilities.  Avoiding confrontations.  Learning to be more assertive.  Regular exercise.  Avoid using alcohol or street drugs to cope.  Eating a healthy, balanced diet, rich in fruit and vegetables and proteins.  Finding humor or absurdity in stressful situations.  Never taking on more than you know you can handle comfortably.  Organizing your time better to get as much done as possible.  Talking to friends or family and sharing your thoughts and fears.  Listening to music or relaxation tapes.  Tensing and then relaxing your muscles, starting at the toes and working up to the head and neck. If you think that you would benefit from help, either in identifying the things that are causing your stress or in learning techniques to help you relax, see a caregiver who is capable of helping you with this. Rather than relying on medications, it is usually better to try and identify the things in your life that are causing stress and try to deal with them. There are many techniques of managing stress including counseling, psychotherapy, aromatherapy, yoga, and exercise. Your caregiver can help you determine what is best for you. Document Released: 09/18/2002 Document Revised: 09/20/2011 Document  Reviewed: 08/15/2007 [facility] Patient Information 2013 [place], [place]. Insomnia Insomnia is frequent trouble falling and/or staying asleep. Insomnia can be a long term problem or a short term problem. Both are common. Insomnia can be a short term problem when the wakefulness is related to a certain stress or worry. Long term insomnia is often related to ongoing stress during waking hours and/or poor sleeping habits. Overtime, sleep deprivation itself can make the problem worse. Every little thing feels more severe because you are overtired and your ability to cope is decreased. CAUSES   Stress, anxiety, and depression.  Poor sleeping habits.  Distractions such as TV in the bedroom.  Naps close to bedtime.  Engaging in emotionally charged conversations before bed.  Technical reading before sleep.  Alcohol and other sedatives. They may make the problem worse. They can hurt normal sleep patterns and normal dream activity.  Stimulants such as caffeine for several hours prior to bedtime.  Pain syndromes and shortness of breath can cause insomnia.  Exercise late at night.  Changing time zones may cause sleeping problems (jet lag). It is sometimes helpful to have someone observe your sleeping patterns. They should look for periods of not breathing during the night (sleep apnea). They should also look to see how long those periods last. If you live alone or observers are uncertain, you can also be observed at a sleep clinic where your sleep patterns will be professionally monitored. Sleep apnea requires a checkup and treatment. Give your  caregivers your medical history. Give your caregivers observations your family has made about your sleep.  SYMPTOMS   Not feeling rested in the morning.  Anxiety and restlessness at bedtime.  Difficulty falling and staying asleep. TREATMENT   Your caregiver may prescribe treatment for an underlying medical disorders. Your caregiver can give advice or  help if you are using alcohol or other drugs for self-medication. Treatment of underlying problems will usually eliminate insomnia problems.  Medications can be prescribed for short time use. They are generally not recommended for lengthy use.  Over-the-counter sleep medicines are not recommended for lengthy use. They can be habit forming.  You can promote easier sleeping by making lifestyle changes such as:  Using relaxation techniques that help with breathing and reduce muscle tension.  Exercising earlier in the day.  Changing your diet and the time of your last meal. No night time snacks.  Establish a regular time to go to bed.  Counseling can help with stressful problems and worry.  Soothing music and white noise may be helpful if there are background noises you cannot remove.  Stop tedious detailed work at least one hour before bedtime. HOME CARE INSTRUCTIONS   Keep a diary. Inform your caregiver about your progress. This includes any medication side effects. See your caregiver regularly. Take note of:  Times when you are asleep.  Times when you are awake during the night.  The quality of your sleep.  How you feel the next day. This information will help your caregiver care for you.  Get out of bed if you are still awake after 15 minutes. Read or do some quiet activity. Keep the lights down. Wait until you feel sleepy and go back to bed.  Keep regular sleeping and waking hours. Avoid naps.  Exercise regularly.  Avoid distractions at bedtime. Distractions include watching television or engaging in any intense or detailed activity like attempting to balance the household checkbook.  Develop a bedtime ritual. Keep a familiar routine of bathing, brushing your teeth, climbing into bed at the same time each night, listening to soothing music. Routines increase the success of falling to sleep faster.  Use relaxation techniques. This can be using breathing and muscle tension  release routines. It can also include visualizing peaceful scenes. You can also help control troubling or intruding thoughts by keeping your mind occupied with boring or repetitive thoughts like the old concept of counting sheep. You can make it more creative like imagining planting one beautiful flower after another in your backyard garden.  During your day, work to eliminate stress. When this is not possible use some of the previous suggestions to help reduce the anxiety that accompanies stressful situations. MAKE SURE YOU:   Understand these instructions.  Will watch your condition.  Will get help right away if you are not doing well or get worse. Document Released: 06/25/2000 Document Revised: 09/20/2011 Document Reviewed: 07/26/2007 [facility] Patient Information 2013 [place], [place].

## 2012-10-16 NOTE — Progress Notes (Signed)
  Subjective:    Patient ID: Matthew Hutchinson, male    DOB: 11/27/1960, 51 y.o.   MRN: [id number]  HPI OSA stable and controlled. HTN controlled. 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   Review of Systems  Constitutional: Negative.   HENT: Negative.   Eyes: Negative.   Respiratory: Positive for apnea.   Cardiovascular: Negative.   Gastrointestinal: Negative.   Endocrine: Negative.   Genitourinary: Negative.   Musculoskeletal: Positive for back pain and arthralgias.  Allergic/Immunologic: Negative.   Neurological: Negative.   Hematological: Negative.   Psychiatric/Behavioral: Negative.        Objective:   Physical Exam  Vitals reviewed. Constitutional: He is oriented to person, place, and time. He appears well-nourished. No distress.  HENT:  Right Ear: External ear normal.  Left Ear: External ear normal.  Nose: Nose normal.  Mouth/Throat: Oropharynx is clear and moist.  Eyes: Conjunctivae and EOM are normal. Pupils are equal, round, and reactive to light. No scleral icterus.  Neck: Normal range of motion. Neck supple. No tracheal deviation present. No thyromegaly present.  Cardiovascular: Normal rate, regular rhythm, normal heart sounds and intact distal pulses.   No murmur heard. Pulmonary/Chest: Effort normal and breath sounds normal.  Abdominal: Soft. Bowel sounds are normal. He exhibits no mass. There is no tenderness.  Genitourinary: Rectum normal, prostate normal and penis normal.  Musculoskeletal: Normal range of motion. He exhibits tenderness. He exhibits no edema.  Lymphadenopathy:    He has no cervical adenopathy.  Neurological: He is alert and oriented to person, place, and time. No cranial nerve deficit. He exhibits normal muscle tone. Coordination normal.  Skin: No rash noted.  Psychiatric: He has a normal mood and affect. His  behavior is normal. Judgment and thought content normal.   ekg ok pfts low       Assessment & Plan:  RF meds 1 year Continue to lose weight

## 2012-10-16 NOTE — Progress Notes (Signed)
  Subjective:    Patient ID: Matthew Hutchinson, male    DOB: 1961-01-25, 52 y.o.   MRN: [id number]  HPI    Review of Systems  Constitutional: Positive for diaphoresis, fatigue and unexpected weight change.  HENT: Positive for tinnitus.   Respiratory: Positive for apnea, shortness of breath and wheezing.   Cardiovascular: Positive for chest pain and leg swelling.  Psychiatric/Behavioral: Positive for sleep disturbance and decreased concentration. The patient is nervous/anxious.        Objective:   Physical Exam        Assessment & Plan:

## 2012-10-17 ENCOUNTER — Encounter: Payer: Self-pay | Admitting: Family Medicine

## 2012-10-19 LAB — IFOBT (OCCULT BLOOD): IFOBT: NEGATIVE

## 2012-11-08 ENCOUNTER — Emergency Department (HOSPITAL_COMMUNITY)
Admission: EM | Admit: 2012-11-08 | Discharge: 2012-11-08 | Disposition: A | Discharge status: Other Acute Inpatient Hospital | Payer: BC Managed Care – PPO | Attending: Emergency Medicine | Admitting: Emergency Medicine

## 2012-11-08 ENCOUNTER — Inpatient Hospital Stay (HOSPITAL_COMMUNITY)
Admission: AD | Admit: 2012-11-08 | Discharge: 2012-11-13 | DRG: 430 | Disposition: A | Discharge status: Home / Self Care | Payer: BC Managed Care – PPO | Source: Intra-hospital | Attending: Psychiatry | Admitting: Psychiatry

## 2012-11-08 ENCOUNTER — Encounter (HOSPITAL_COMMUNITY): Payer: Self-pay | Admitting: *Deleted

## 2012-11-08 ENCOUNTER — Encounter (HOSPITAL_COMMUNITY): Payer: Self-pay | Admitting: Emergency Medicine

## 2012-11-08 DIAGNOSIS — R45851 Suicidal ideations: Secondary | ICD-10-CM

## 2012-11-08 DIAGNOSIS — F411 Generalized anxiety disorder: Secondary | ICD-10-CM | POA: Insufficient documentation

## 2012-11-08 DIAGNOSIS — F339 Major depressive disorder, recurrent, unspecified: Secondary | ICD-10-CM | POA: Diagnosis present

## 2012-11-08 DIAGNOSIS — E785 Hyperlipidemia, unspecified: Secondary | ICD-10-CM

## 2012-11-08 DIAGNOSIS — E789 Disorder of lipoprotein metabolism, unspecified: Secondary | ICD-10-CM

## 2012-11-08 DIAGNOSIS — Z79899 Other long term (current) drug therapy: Secondary | ICD-10-CM | POA: Insufficient documentation

## 2012-11-08 DIAGNOSIS — I2510 Atherosclerotic heart disease of native coronary artery without angina pectoris: Secondary | ICD-10-CM

## 2012-11-08 DIAGNOSIS — G4733 Obstructive sleep apnea (adult) (pediatric): Secondary | ICD-10-CM | POA: Diagnosis present

## 2012-11-08 DIAGNOSIS — Z87891 Personal history of nicotine dependence: Secondary | ICD-10-CM | POA: Insufficient documentation

## 2012-11-08 DIAGNOSIS — I10 Essential (primary) hypertension: Secondary | ICD-10-CM | POA: Insufficient documentation

## 2012-11-08 DIAGNOSIS — R635 Abnormal weight gain: Secondary | ICD-10-CM

## 2012-11-08 DIAGNOSIS — G8921 Chronic pain due to trauma: Secondary | ICD-10-CM

## 2012-11-08 DIAGNOSIS — Z7982 Long term (current) use of aspirin: Secondary | ICD-10-CM | POA: Insufficient documentation

## 2012-11-08 DIAGNOSIS — F3289 Other specified depressive episodes: Secondary | ICD-10-CM | POA: Insufficient documentation

## 2012-11-08 DIAGNOSIS — F332 Major depressive disorder, recurrent severe without psychotic features: Principal | ICD-10-CM | POA: Diagnosis present

## 2012-11-08 DIAGNOSIS — J438 Other emphysema: Secondary | ICD-10-CM

## 2012-11-08 DIAGNOSIS — G4700 Insomnia, unspecified: Secondary | ICD-10-CM | POA: Diagnosis present

## 2012-11-08 DIAGNOSIS — F329 Major depressive disorder, single episode, unspecified: Secondary | ICD-10-CM

## 2012-11-08 HISTORY — DX: Depression, unspecified: F32.A

## 2012-11-08 HISTORY — DX: Anxiety disorder, unspecified: F41.9

## 2012-11-08 HISTORY — DX: Major depressive disorder, single episode, unspecified: F32.9

## 2012-11-08 LAB — COMPREHENSIVE METABOLIC PANEL
Albumin: 4.2 g/dL (ref 3.5–5.2)
Alkaline Phosphatase: 87 U/L (ref 39–117)
BUN: 11 mg/dL (ref 6–23)
CO2: 28 mEq/L (ref 19–32)
Chloride: 101 mEq/L (ref 96–112)
Creatinine, Ser: 0.91 mg/dL (ref 0.50–1.35)
GFR calc Af Amer: >90 mL/min (ref >90)
GFR calc non Af Amer: >90 mL/min (ref >90)
Glucose, Bld: 144 mg/dL — ABNORMAL HIGH (ref 70–99)
Potassium: 3.5 mEq/L (ref 3.5–5.1)
Total Bilirubin: 1 mg/dL (ref 0.3–1.2)

## 2012-11-08 LAB — URINALYSIS, ROUTINE W REFLEX MICROSCOPIC
Glucose, UA: NEGATIVE mg/dL
Ketones, ur: 40 mg/dL — AB
Protein, ur: NEGATIVE mg/dL
Urobilinogen, UA: 0.2 mg/dL (ref 0.0–1.0)

## 2012-11-08 LAB — ACETAMINOPHEN LEVEL: Acetaminophen (Tylenol), Serum: <15 ug/mL (ref 10–30)

## 2012-11-08 LAB — CBC
HCT: 44.6 % (ref 39.0–52.0)
Hemoglobin: 16.3 g/dL (ref 13.0–17.0)
MCV: 82.9 fL (ref 78.0–100.0)
RBC: 5.38 MIL/uL (ref 4.22–5.81)
WBC: 5.6 10*3/uL (ref 4.0–10.5)

## 2012-11-08 LAB — RAPID URINE DRUG SCREEN, HOSP PERFORMED
Amphetamines: NOT DETECTED
Tetrahydrocannabinol: NOT DETECTED

## 2012-11-08 LAB — URINE MICROSCOPIC-ADD ON

## 2012-11-08 LAB — SALICYLATE LEVEL: Salicylate Lvl: <2 mg/dL — ABNORMAL LOW (ref 2.8–20.0)

## 2012-11-08 LAB — ETHANOL: Alcohol, Ethyl (B): <11 mg/dL (ref 0–11)

## 2012-11-08 MED ORDER — NICOTINE 14 MG/24HR TD PT24
14.0000 mg | MEDICATED_PATCH | Freq: Every day | TRANSDERMAL | Status: DC
  Filled 2012-11-08 (×3): qty 1

## 2012-11-08 MED ORDER — ARMODAFINIL 150 MG PO TABS
150.0000 mg | ORAL_TABLET | Freq: Every day | ORAL | Status: DC
  Filled 2012-11-08 (×2): qty 1

## 2012-11-08 MED ORDER — FAMOTIDINE 20 MG PO TABS: 20.0000 mg | ORAL_TABLET | Freq: Two times a day (BID) | ORAL | Status: DC | PRN

## 2012-11-08 MED ORDER — MAGNESIUM HYDROXIDE 400 MG/5ML PO SUSP: 30.0000 mL | Freq: Every day | ORAL | Status: DC | PRN

## 2012-11-08 MED ORDER — ASPIRIN 81 MG PO CHEW
81.0000 mg | CHEWABLE_TABLET | Freq: Every day | ORAL | Status: DC
  Administered 2012-11-09 – 2012-11-13 (×5): 81 mg via ORAL
  Filled 2012-11-08 (×8): qty 1

## 2012-11-08 MED ORDER — MULTIVITAMINS PO CAPS: 1.0000 | ORAL_CAPSULE | Freq: Every day | ORAL | Status: DC

## 2012-11-08 MED ORDER — MAGNESIUM GLUCONATE 500 MG PO TABS
500.0000 mg | ORAL_TABLET | Freq: Every day | ORAL | Status: DC
  Administered 2012-11-09 – 2012-11-13 (×5): 500 mg via ORAL
  Filled 2012-11-08 (×7): qty 1

## 2012-11-08 MED ORDER — ADULT MULTIVITAMIN W/MINERALS CH
1.0000 | ORAL_TABLET | Freq: Every day | ORAL | Status: DC
  Administered 2012-11-09 – 2012-11-13 (×5): 1 via ORAL
  Filled 2012-11-08 (×7): qty 1

## 2012-11-08 MED ORDER — ALUM & MAG HYDROXIDE-SIMETH 200-200-20 MG/5ML PO SUSP: 30.0000 mL | ORAL | Status: DC | PRN

## 2012-11-08 MED ORDER — ZOLPIDEM TARTRATE 5 MG PO TABS: 10.0000 mg | ORAL_TABLET | Freq: Every evening | ORAL | Status: DC | PRN

## 2012-11-08 MED ORDER — HYDROXYZINE HCL 25 MG PO TABS
50.0000 mg | ORAL_TABLET | Freq: Three times a day (TID) | ORAL | Status: DC
  Administered 2012-11-08: 50 mg via ORAL
  Filled 2012-11-08: qty 2

## 2012-11-08 MED ORDER — ALPRAZOLAM 0.25 MG PO TABS: 0.5000 mg | ORAL_TABLET | Freq: Two times a day (BID) | ORAL | Status: DC | PRN

## 2012-11-08 MED ORDER — NITROGLYCERIN 0.4 MG SL SUBL: 0.4000 mg | SUBLINGUAL_TABLET | SUBLINGUAL | Status: DC | PRN

## 2012-11-08 MED ORDER — ONDANSETRON HCL 4 MG/2ML IJ SOLN
4.0000 mg | Freq: Once | INTRAMUSCULAR | Status: AC
  Administered 2012-11-08: 4 mg via INTRAVENOUS
  Filled 2012-11-08: qty 2

## 2012-11-08 MED ORDER — LUTEIN 40 MG PO CAPS: 1.0000 | ORAL_CAPSULE | Freq: Every day | ORAL | Status: DC

## 2012-11-08 MED ORDER — ALPRAZOLAM 1 MG PO TABS
1.0000 mg | ORAL_TABLET | Freq: Three times a day (TID) | ORAL | Status: DC | PRN
  Filled 2012-11-08: qty 1

## 2012-11-08 MED ORDER — VITAMIN D 1000 UNITS PO TABS
2000.0000 [IU] | ORAL_TABLET | Freq: Every day | ORAL | Status: DC
  Administered 2012-11-09 – 2012-11-13 (×5): 2000 [IU] via ORAL
  Filled 2012-11-08 (×7): qty 2

## 2012-11-08 MED ORDER — ACETAMINOPHEN 325 MG PO TABS: 650.0000 mg | ORAL_TABLET | Freq: Four times a day (QID) | ORAL | Status: DC | PRN

## 2012-11-08 MED ORDER — SIMVASTATIN 20 MG PO TABS
20.0000 mg | ORAL_TABLET | Freq: Every day | ORAL | Status: DC
  Administered 2012-11-08 – 2012-11-12 (×5): 20 mg via ORAL
  Filled 2012-11-08 (×8): qty 1

## 2012-11-08 MED ORDER — GABAPENTIN 300 MG PO CAPS
900.0000 mg | ORAL_CAPSULE | Freq: Three times a day (TID) | ORAL | Status: DC
  Administered 2012-11-08: 900 mg via ORAL
  Filled 2012-11-08: qty 3

## 2012-11-08 MED ORDER — ONDANSETRON 4 MG PO TBDP
4.0000 mg | ORAL_TABLET | Freq: Three times a day (TID) | ORAL | Status: DC | PRN
  Administered 2012-11-08: 4 mg via ORAL
  Filled 2012-11-08: qty 1

## 2012-11-08 MED ORDER — PAROXETINE HCL 20 MG PO TABS
20.0000 mg | ORAL_TABLET | Freq: Every day | ORAL | Status: DC
  Filled 2012-11-08: qty 1

## 2012-11-08 MED ORDER — GABAPENTIN 300 MG PO CAPS
900.0000 mg | ORAL_CAPSULE | Freq: Three times a day (TID) | ORAL | Status: DC
  Administered 2012-11-09 – 2012-11-13 (×14): 900 mg via ORAL
  Filled 2012-11-08 (×21): qty 3

## 2012-11-08 MED ORDER — SODIUM CHLORIDE 0.9 % IV BOLUS (SEPSIS): 1000.0000 mL | Freq: Once | INTRAVENOUS | Status: DC

## 2012-11-08 MED ORDER — CALCIUM GLUCONATE 500 MG PO TABS
500.0000 mg | ORAL_TABLET | Freq: Every day | ORAL | Status: DC
  Filled 2012-11-08 (×2): qty 1

## 2012-11-08 MED ORDER — TRAZODONE HCL 50 MG PO TABS
50.0000 mg | ORAL_TABLET | Freq: Every evening | ORAL | Status: DC | PRN
  Administered 2012-11-08 – 2012-11-11 (×6): 50 mg via ORAL
  Filled 2012-11-08 (×12): qty 1

## 2012-11-08 MED ORDER — SIMVASTATIN 20 MG PO TABS
20.0000 mg | ORAL_TABLET | Freq: Every day | ORAL | Status: DC
  Filled 2012-11-08: qty 1
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
## 2012-11-08 NOTE — ED Provider Notes (Signed)
History     CSN: [id number]  Arrival date & time 11/08/12  0422   First MD Initiated Contact with Patient 11/08/12 [id number]      Chief Complaint  Patient presents with  . Suicidal  . Drug Overdose    (Consider location/radiation/quality/duration/timing/severity/associated sxs/prior treatment) HPI This patient is a middle-aged man with well-controlled high blood pressure high cholesterol. He drove himself to the emergency this morning with feelings of suicidal ideation. The patient says he has had significant work stress. 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 Patient says he feels like he does all the work and he is very angry at his business partner.  The patient is especially angry yesterday afternoon. He came home from work around 5 PM and took 1 mg of Xanax. Shortly after that, he felt like he needed to sleep. He has chronic insomnia. Thus, he took 10 mg Ambien. He did not fall asleep. Thus,, throughout the night he took 210 mg Ambien tablets sequentially every 1-2 hours for a total of 14 anion tablets-140 mg of immediate release and began. He says he feels little tired now.  The patient ultimately called the suicide hotline when he felt like he did not care if he did not wake up. Patient denies any other ingestions. He denies any use of alcohol or illicit drugs.   Patient denies any psychiatric history of hospitalizations. He lives with his same sex partner and they are experiencing some financial stress because partner is not currently employed.   Patient says he does not feel suicidal or homicidal at this time. However, he has bouts of rage and, at those times, says he feels like kllling himself or killing "someone else".   Past Medical History  Diagnosis Date  . Unspecified essential hypertension   . OSA (obstructive sleep apnea)   . Morbid obesity   . Depression   . Anxiety     Past Surgical History   Procedure Laterality Date  . Tonsillectomy    . Leg surgery      Family History  Problem Relation Age of Onset  . Ovarian cancer Mother   . Breast cancer Mother   . Macular degeneration Mother   . Emphysema Paternal Grandfather   . Lung cancer Paternal Uncle   . Cancer - Other Father   . Cancer Maternal Grandmother   . Heart disease Maternal Grandfather     History  Substance Use Topics  . Smoking status: Former Smoker -- 2.00 packs/day for 26 years    Types: Cigarettes    Quit date: 08/13/2003  . Smokeless tobacco: Not on file     Comment: 2ppd x 26 years  . Alcohol Use: No      Review of Systems Gen: no weight loss, fevers, chills, night sweats Eyes: no discharge or drainage, no occular pain or visual changes Nose: no epistaxis or rhinorrhea Mouth: no dental pain, no sore throat Neck: no neck pain Lungs: no SOB, cough, wheezing CV: no chest pain, palpitations, dependent edema or orthopnea Abd: no abdominal pain, nausea, vomiting GU: no dysuria or gross hematuria MSK: no myalgias or arthralgias Neuro: no headache, no focal neurologic deficits Skin: no rash Psyche: as per hpi, otherwise negative.   Allergies  Penicillins and Valsartan  Home Medications   Current Outpatient Rx  Name  Route  Sig  Dispense  Refill  . ALPRAZolam (XANAX) 1 MG tablet   Oral  Take 0.5-2 mg by mouth at bedtime as needed for sleep or anxiety.         . Armodafinil (NUVIGIL) 150 MG tablet   Oral   Take 150 mg by mouth daily.         Marland Kitchen aspirin 81 MG tablet   Oral   Take 81 mg by mouth daily.           Marland Kitchen b complex vitamins capsule   Oral   Take 1 capsule by mouth daily.         . calcium gluconate 500 MG tablet   Oral   Take 500 mg by mouth daily.           . Cholecalciferol (VITAMIN D) 2000 UNITS tablet   Oral   Take 2,000 Units by mouth daily.           . famotidine (PEPCID) 20 MG tablet   Oral   Take 20 mg by mouth 2 (two) times daily as needed for  heartburn.          . gabapentin (NEURONTIN) 300 MG capsule   Oral   Take 3 capsules (900 mg total) by mouth 3 (three) times daily.   810 capsule   3   . Garlic 100 MG TABS   Oral   Take 1 tablet by mouth daily.           Marland Kitchen ibuprofen (ADVIL,MOTRIN) 600 MG tablet   Oral   Take 600 mg by mouth every 6 (six) hours as needed for pain.          Marland Kitchen losartan-hydrochlorothiazide (HYZAAR) 50-12.5 MG per tablet   Oral   Take 1 tablet by mouth daily.   90 tablet   3   . Lutein 40 MG CAPS   Oral   Take 1 capsule by mouth daily.           . magnesium gluconate (MAGONATE) 500 MG tablet   Oral   Take 500 mg by mouth daily.           . Multiple Vitamin (MULTIVITAMIN) capsule   Oral   Take 1 capsule by mouth daily.           . nitroGLYCERIN (NITROSTAT) 0.4 MG SL tablet   Sublingual   Place 0.4 mg under the tongue every 5 (five) minutes as needed.           Marland Kitchen omeprazole (PRILOSEC) 20 MG capsule   Oral   Take 20 mg by mouth daily as needed (for acid reflux).          . Potassium 99 MG TABS   Oral   Take 1 tablet by mouth daily.           [name] 740 MG CAPS   Oral   Take 1 capsule by mouth 3 (three) times daily.           . simvastatin (ZOCOR) 20 MG tablet   Oral   Take 1 tablet (20 mg total) by mouth at bedtime.   90 tablet   3   . zolpidem (AMBIEN) 10 MG tablet   Oral   Take 1 tablet (10 mg total) by mouth at bedtime as needed.   90 tablet   3     BP 146/81  Pulse 105  Temp(Src) 98.7 F (37.1 C) (Oral)  SpO2 92%  Physical Exam Gen: well developed and well nourished appearing Head: NCAT Eyes: PERL, EOMI Nose: no  epistaixis or rhinorrhea Mouth/throat: mucosa is moist and pink Neck: supple, no stridor Lungs: CTA B, no wheezing, rhonchi or rales Abd: soft, notender, nondistended Back: no ttp, no cva ttp Skin: no rashese, wnl Neuro: CN ii-xii grossly intact, no focal deficits Psyche; flat affect,  calm and cooperative, seems to  have reasonably good insight  ED Course  Procedures (including critical care time)  Results for orders placed during the hospital encounter of 11/08/12 (from the past 24 hour(s))  CBC     Status: Abnormal   Collection Time    11/08/12  6:12 AM      Result Value Range   WBC 5.6  4.0 - 10.5 K/uL   RBC 5.38  4.22 - 5.81 MIL/uL   Hemoglobin 16.3  13.0 - 17.0 g/dL   HCT 10.2  72.5 - 36.6 %   MCV 82.9  78.0 - 100.0 fL   MCH 30.3  26.0 - 34.0 pg   MCHC 36.5 (*) 30.0 - 36.0 g/dL   RDW 44.0  34.7 - 42.5 %   Platelets 166  150 - 400 K/uL      MDM  Patient is medically cleared based on exam and vital signs. Tylenol level, ASA level, UDS and ETOH levels are pending. I have ordered suicide precautions. I have discussed the patient's case with Berna Spare of the ACT team.  He will arrange formal psychiatric consultation.         Brandt Loosen, MD 11/08/12 [phone]

## 2012-11-08 NOTE — ED Provider Notes (Signed)
[facility] by Dr. Daleen Bo.  BP 133/81  Pulse 91  Temp(Src) 97.4 F (36.3 C) (Oral)  Resp 16  SpO2 95%   Glynn Octave, MD 11/08/12 2018

## 2012-11-08 NOTE — ED Notes (Signed)
Diet tray ordered 
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
## 2012-11-08 NOTE — ED Notes (Signed)
Pt. Vomited a large amount of liquid after receiving Zofran.  Pt. Given a cold wash cloth, and is feeling better.  Pt.s partner at the bedside

## 2012-11-08 NOTE — ED Notes (Signed)
HOUSE COVERAGE AND CHARGE RN NOTIFIED ABOUT PT. PT CHANGING INTO SCRUBS AND SECURITY READY TO WAND.

## 2012-11-08 NOTE — Tx Team (Signed)
Initial Interdisciplinary Treatment Plan  PATIENT STRENGTHS: (choose at least two) Average or above average intelligence Communication skills Financial means General fund of knowledge Supportive family/friends Work skills  PATIENT STRESSORS: Financial difficulties Health problems   PROBLEM LIST: Problem List/Patient Goals Date to be addressed Date deferred Reason deferred Estimated date of resolution  Suicidal/Homicidal Ideations 11/07/12     HTN, Hyperlipidemia 11/07/12     Hx. COPD 11/07/12     CAD 11/07/12                                    DISCHARGE CRITERIA:  Improved stabilization in mood, thinking, and/or behavior Need for constant or close observation no longer present Reduction of life-threatening or endangering symptoms to within safe limits  PRELIMINARY DISCHARGE PLAN: Outpatient therapy Participate in family therapy  PATIENT/FAMIILY INVOLVEMENT: This treatment plan has been presented to and reviewed with the patient, Matthew Hutchinson, and/or family member.  The patient and family have been given the opportunity to ask questions and make suggestions.  [name] 11/08/2012, 10:35 PM

## 2012-11-08 NOTE — ED Notes (Signed)
telepsych request sent- spoke with [place].

## 2012-11-08 NOTE — ED Notes (Signed)
Pt.is feeling better, eating soup and a grilled cheese sandwich.
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
## 2012-11-08 NOTE — ED Notes (Signed)
Pt. Is nauseated.  Pt. Medicated per MD orders and cold cloth given for pt.s forehead

## 2012-11-08 NOTE — ED Notes (Addendum)
Security at bedside to transport patient to [facility]. Matthew Hutchinson: DPR called and informed of transfer

## 2012-11-08 NOTE — ED Provider Notes (Addendum)
Patient this morning denying homicidal or suicidal ideation currently however states he has thoughts of cutting people up into tiny pieces and killing them and having dreams. He states he's had issues in the past 20 years ago with similar symptoms. Patient is currently calm and cooperative. Will get a telepsych this a.m. and ACT to evaluate  12:04 PM Telepsych completed and recommended inpt admission with Paxil 20 mg at bedtime, Vistaril 50 mg 3 times a day and Xanax 0.5 twice a day when necessary for anxiety  Gwyneth Sprout, MD 11/08/12 0930  Gwyneth Sprout, MD 11/08/12 1205

## 2012-11-08 NOTE — Progress Notes (Signed)
PHARMACIST - PHYSICIAN ORDER COMMUNICATION  CONCERNING: P&T Medication Policy on Herbal Medications  DESCRIPTION:  This patient's order for:  Lutein caps  has been noted.  This product(s) is classified as an "herbal" or natural product. Due to a lack of definitive safety studies or FDA approval, nonstandard manufacturing practices, plus the potential risk of unknown drug-drug interactions while on inpatient medications, the Pharmacy and Therapeutics Committee does not permit the use of "herbal" or natural products of this type within [facility].   ACTION TAKEN: The pharmacy department is unable to verify this order at this time and your patient has been informed of this safety policy. Please reevaluate patient's clinical condition at discharge and address if the herbal or natural product(s) should be resumed at that time.  Lorenza Evangelist 11/08/2012 10:28 PM

## 2012-11-09 ENCOUNTER — Encounter (HOSPITAL_COMMUNITY): Payer: Self-pay | Admitting: Psychiatry

## 2012-11-09 DIAGNOSIS — G4700 Insomnia, unspecified: Secondary | ICD-10-CM | POA: Diagnosis present

## 2012-11-09 DIAGNOSIS — F329 Major depressive disorder, single episode, unspecified: Secondary | ICD-10-CM

## 2012-11-09 DIAGNOSIS — F339 Major depressive disorder, recurrent, unspecified: Secondary | ICD-10-CM | POA: Diagnosis present

## 2012-11-09 MED ORDER — CALCIUM CARBONATE 1250 (500 CA) MG PO TABS
1.0000 | ORAL_TABLET | Freq: Every day | ORAL | Status: DC
  Filled 2012-11-09: qty 1

## 2012-11-09 MED ORDER — SERTRALINE HCL 25 MG PO TABS
25.0000 mg | ORAL_TABLET | Freq: Every day | ORAL | Status: DC
  Administered 2012-11-09 – 2012-11-13 (×5): 25 mg via ORAL
  Filled 2012-11-09 (×8): qty 1

## 2012-11-09 MED ORDER — HYDROXYZINE HCL 25 MG PO TABS
25.0000 mg | ORAL_TABLET | ORAL | Status: DC | PRN
  Administered 2012-11-11 – 2012-11-12 (×2): 25 mg via ORAL

## 2012-11-09 MED ORDER — CALCIUM CARBONATE ANTACID 500 MG PO CHEW
1.0000 | CHEWABLE_TABLET | Freq: Every day | ORAL | Status: DC
  Filled 2012-11-09 (×2): qty 1

## 2012-11-09 NOTE — Progress Notes (Signed)
Pt attended Karaoke group; did not participate but was supportive of peers.  

## 2012-11-09 NOTE — [facility] Group Notes (Signed)
[facility] LCSW Aftercare Discharge Planning Group Note   11/09/2012 9:36 AM  Participation Quality:  Patient was called out of group, but returned towards the end. He was guarded, but did answer questions vaguely.  Mood/Affect:  Blunted, Depressed and Flat  Depression Rating:  2  Anxiety Rating:  8  (related to work issues and a key person who has been a button pusher with patient)  Thoughts of Suicide:  No Will you contract for safety?   Yes  Current AVH:  No  Plan for Discharge/Comments:  Patient reports he was admitted to stabilize on his medication and working to address his anxiety with work and a Licensed conveyancer whom he struggles getting along with. Patient reports his work has always been an issues. He has current providers in the outpatient : Dr. [name] and patient thinks he will resume services with Felix Pacini whom he saw for therapy, but hasn't in the last 2 years.    Transportation Means: Patient reports his partner can come and pick him up  Supports:  Programme researcher, broadcasting/film/video, peers at work, few friends. Patient reports his family does not know about his mental health issues and does not want to share with them as they live hours away.  Nail, [name]

## 2012-11-09 NOTE — Progress Notes (Signed)
D: Patient appropriate and cooperative with staff. Patient's affect/mood is flat, blunted and depressed. He reported on the self inventory sheet that his sleep/appetite is poor, energy level is normal and ability to pay attention is good. Patient rated depression "6" and feelings of hopelessness "7". Patient is attending groups and compliant with medication regimen.  A: Support and encouragement provided to patient. Scheduled medications administered per MD orders. Maintain Q15 minute checks for safety.  R: Patient receptive. Denies SI/HI/AVH at this time. Patient remains safe.
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
## 2012-11-09 NOTE — [facility] Group Notes (Signed)
[facility] LCSW Group Therapy  11/09/2012 4:48 PM  Type of Therapy:  Group Therapy  Participation Level:  Active  Participation Quality:  Appropriate  Affect:  Appropriate  Cognitive:  Alert, Appropriate and Oriented  Insight:  Developing/Improving  Engagement in Therapy:  Engaged  Modes of Intervention:  Discussion and Support  Summary of Progress/Problems:  MHA speaker [name] came to group to discuss her history with  Mental health and market the [facility] program for patients. Matthew Hutchinson was very engaged in group but quiet with regards to questions and providing insight as he relates to story.  He appeared to enjoy the group and participating.   Matthew Hutchinson, [name] 11/09/2012, 4:48 PM

## 2012-11-09 NOTE — Progress Notes (Signed)
Patient ID: Matthew Hutchinson, male   DOB: 22-Aug-1960, 52 y.o.   MRN: [id number] D: pt. Visible on the unit, interacting with significant other and other clients.Pt. Reports feels more hopeful since visit with S.O.  Pt. Reports groups okay, last speaker "interesting". A: Writer encouraged pt. To continue positive focus and verbalize needs. Staff will monitor q50min for safety. Staff encouraged group. R: Pt. Is safe on the unit. Pt. Attended karaoke.
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
## 2012-11-10 NOTE — Progress Notes (Signed)
D: Patient appropriate and cooperative with staff. Patient's affect/mood is flat and depressed, but brighter today as opposed to previous shift. He reported on the self inventory sheet that his sleep is poor, appetite is improving, energy level is normal and ability to pay attention is good. Patient rated depression "1" and feelings of hopelessness "2". Patient observed being isolated to his room, when not attending groups throughout the day.  A: Support and encouragement provided to patient. Administered scheduled medications per ordering MD. Monitor Q15 minute checks for safety.  R: Patient receptive. Denies SI/HI/AVH. Patient remains safe.

## 2012-11-10 NOTE — [facility] Group Notes (Signed)
[facility] LCSW Group Therapy          Feelings Around Relapse          1:15-2:30 PM 11/10/2012 12:33 PM  Type of Therapy:  Group Therapy  Participation Level:  Active  Participation Quality:  Appropriate  Affect:  Appropriate  Cognitive:  Appropriate  Insight:  Engaged  Engagement in Therapy:  Engaged  Modes of Intervention: Discussion, Education, Exploration, Problem-Solving, Rapport Building, Support  Summary of Progress/Problems:  Patient advised relapsing would mean taking all the responsibility of the job on himself again.  He shard he plans to have a meeting with staff and delegate responsibilities and spend more personal time rebuilding his life.  Wynn Banker 11/10/2012, 12:33 PM

## 2012-11-10 NOTE — Progress Notes (Signed)
D.  Pt pleasant on approach, states he hardly slept at all last night, the C-Pap was very uncomfortable. Tonight he has his C-Pap from home and is hopeful that he will sleep better with that.  Pt was positive for evening group with appropriate participation.  Interacting appropriately within milieu.  Denies SI/HI/hallucinations at this time.  A.  Support and encouragement offered, medication given as ordered for insomnia.  R.  Pt remains safe on unit, will continue to monitor.

## 2012-11-10 NOTE — Progress Notes (Signed)
[facility] MD Progress Note  11/10/2012 1:22 PM Matthew Hutchinson  MRN:  [id number] Subjective: Patient reports he continues to be depressed and overwhelmed. Tolerating Zoloft well without side effects. States his partner visited him last evening, realizes he needs to separate work from home life. Would like to attend IOP after discharge.  Diagnosis:   Axis I: Major Depression, Recurrent severe Axis II: Deferred Axis III:  Past Medical History  Diagnosis Date  . Unspecified essential hypertension   . OSA (obstructive sleep apnea)   . Morbid obesity   . Depression   . Anxiety    Axis IV: occupational problems and other psychosocial or environmental problems Axis V: 41-50 serious symptoms  ADL's:  Intact  Sleep: Fair  Appetite:  Fair   Psychiatric Specialty Exam: Review of Systems  Constitutional: Negative.   HENT: Negative.   Eyes: Negative.   Respiratory: Negative.   Cardiovascular: Negative.   Gastrointestinal: Negative.   Genitourinary: Negative.   Musculoskeletal: Positive for myalgias.  Skin: Negative.   Neurological: Negative.   Endo/Heme/Allergies: Negative.   Psychiatric/Behavioral: Positive for depression. The patient is nervous/anxious.     Blood pressure 139/93, pulse 92, temperature 98 F (36.7 C), temperature source Oral, resp. rate 16, height 5\' 10"  (1.778 m), weight 132.45 kg (292 lb).Body mass index is 41.9 kg/(m^2).  General Appearance: Casual  Eye Contact::  Fair  Speech:  Slow  Volume:  Decreased  Mood:  Anxious, Depressed and Dysphoric  Affect:  Constricted and Depressed  Thought Process:  Circumstantial  Orientation:  Full (Time, Place, and Person)  Thought Content:  Rumination  Suicidal Thoughts:  No  Homicidal Thoughts:  No  Memory:  Immediate;   Fair Recent;   Fair Remote;   Fair  Judgement:  Fair  Insight:  Fair  Psychomotor Activity:  Decreased  Concentration:  Fair  Recall:  Fair  Akathisia:  No  Handed:  Right  AIMS (if indicated):      Assets:  Communication Skills Desire for Improvement Housing Social Support  Sleep:  Number of Hours: 6   Current Medications: Current Facility-Administered Medications  Medication Dose Route Frequency Provider Last Rate Last Dose  . acetaminophen (TYLENOL) tablet 650 mg  650 mg Oral Q6H PRN Kerry Hough, PA-C      . ALPRAZolam Prudy Feeler) tablet 1 mg  1 mg Oral TID PRN Kerry Hough, PA-C      . alum & mag hydroxide-simeth (MAALOX/MYLANTA) 200-200-20 MG/5ML suspension 30 mL  30 mL Oral Q4H PRN Kerry Hough, PA-C      . aspirin chewable tablet 81 mg  81 mg Oral Daily Kerry Hough, PA-C   81 mg at 11/10/12 0801  . cholecalciferol (VITAMIN D) tablet 2,000 Units  2,000 Units Oral Daily Kerry Hough, PA-C   2,000 Units at 11/10/12 0800  . famotidine (PEPCID) tablet 20 mg  20 mg Oral BID PRN Kerry Hough, PA-C      . gabapentin (NEURONTIN) capsule 900 mg  900 mg Oral TID Kerry Hough, PA-C   900 mg at 11/10/12 1203  . hydrOXYzine (ATARAX/VISTARIL) tablet 25 mg  25 mg Oral Q4H PRN Nanine Means, NP      . magnesium gluconate (MAGONATE) tablet 500 mg  500 mg Oral Daily Kerry Hough, PA-C   500 mg at 11/10/12 0802  . magnesium hydroxide (MILK OF MAGNESIA) suspension 30 mL  30 mL Oral Daily PRN Kerry Hough, PA-C      .  multivitamin with minerals tablet 1 tablet  1 tablet Oral Daily Natavia Sublette, MD   1 tablet at 11/10/12 0800  . nitroGLYCERIN (NITROSTAT) SL tablet 0.4 mg  0.4 mg Sublingual Q5 min PRN Kerry Hough, PA-C      . sertraline (ZOLOFT) tablet 25 mg  25 mg Oral Daily Eulia Hatcher, MD   25 mg at 11/10/12 0802  . simvastatin (ZOCOR) tablet 20 mg  20 mg Oral QHS Kerry Hough, PA-C   20 mg at 11/09/12 2148  . traZODone (DESYREL) tablet 50 mg  50 mg Oral QHS,MR X 1 Kerry Hough, PA-C   50 mg at 11/09/12 2148    Lab Results: No results found for this or any previous visit (from the past 48 hour(s)).  Physical Findings: AIMS: Facial and Oral  Movements Muscles of Facial Expression: None, normal Lips and Perioral Area: None, normal Jaw: None, normal Tongue: None, normal,Extremity Movements Upper (arms, wrists, hands, fingers): None, normal Lower (legs, knees, ankles, toes): None, normal, Trunk Movements Neck, shoulders, hips: None, normal, Overall Severity Severity of abnormal movements (highest score from questions above): None, normal Incapacitation due to abnormal movements: None, normal Patient's awareness of abnormal movements (rate only patient's report): No Awareness, Dental Status Current problems with teeth and/or dentures?: No Does patient usually wear dentures?: No  CIWA:  CIWA-Ar Total: 0 COWS:     Treatment Plan Summary: Daily contact with patient to assess and evaluate symptoms and progress in treatment Medication management  Plan: Continue current plan of care. Provide supportive counselling and education.  Medical Decision Making Problem Points:  Established problem, stable/improving (1), Review of last therapy session (1) and Review of psycho-social stressors (1) Data Points:  Review of medication regiment & side effects (2)  I certify that inpatient services furnished can reasonably be expected to improve the patient's condition.   Rhylan Gross 11/10/2012, 1:22 PM

## 2012-11-10 NOTE — [facility] Group Notes (Signed)
[facility] LCSW Aftercare Discharge Planning Group Note   11/10/2012 12:27 PM  Participation Quality:    Mood/Affect:  Appropriate  Depression Rating:  1  Anxiety Rating:  2  Thoughts of Suicide:  No  Will you contract for safety?   NA  Current AVH:  No  Plan for Discharge/Comments:  Patient reports admitting to hospital due to an overdose.  Patient shared he did not want to die but was frustrated due to not being able to get the mental health services he believed he needed.  He reports having home, transportation, and access to meds.  He shared he is seen by a therapist at [employer], where his partner is employed and will continue to see that therapist.  He is asking for a referral for medication management.  Transportation Means: Patient has transportation.   Supports:  Patient has a good support system.   Grayson White, Joesph July

## 2012-11-10 NOTE — Plan of Care (Signed)
Problem: Ineffective individual coping Goal: STG: Patient will participate in after care plan Patient is attending group and discussing issues.  Horace Porteous Camey Edell, LCSW 11/10/2012 Outcome: Completed/Met Date Met:  11/10/12 Patient is attending groups and discussing concerns.  Patient referred to [facility] for medication management.

## 2012-11-10 NOTE — Plan of Care (Signed)
Problem: Alteration in mood Goal: LTG-Pt's behavior demonstrates decreased signs of depression Patient is attending groups and talking about concerns. He is rating depression at one. Patient asked for and has received follow up for medication management.  Horace Porteous Darshay Deupree, LCSW 11/10/2012 Outcome: Completed/Met Date Met:  11/10/12 Patient rating depression at one. Referral made for medication management.  Horace Porteous Joyanna Kleman, LCSW 11/10/2012
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
## 2012-11-10 NOTE — Progress Notes (Signed)
Adult Psychoeducational Group Note  Date:  11/10/2012 Time:  11:22 AM  Group Topic/Focus:  Wellness Toolbox:   The focus of this group is to discuss various aspects of wellness, balancing those aspects and exploring ways to increase the ability to experience wellness.  Patients will create a wellness toolbox for use upon discharge.  Participation Level:  Active  Participation Quality:  Appropriate and Attentive  Affect:  Appropriate  Cognitive:  Alert and Oriented  Insight: Good  Engagement in Group:  Engaged  Modes of Intervention:  Activity and Support  Additional Comments:  Pt stated his goal is to participate in treatment here  Martika Egler T 11/10/2012, 11:22 AM

## 2012-11-10 NOTE — Progress Notes (Signed)
Adult Psychoeducational Group Note  Date:  11/10/2012 Time:  9:43 PM  Group Topic/Focus:  Goals Group:   The focus of this group is to help patients establish daily goals to achieve during treatment and discuss how the patient can incorporate goal setting into their daily lives to aide in recovery.  Participation Level:  Active  Participation Quality:  Appropriate  Affect:  Appropriate  Cognitive:  Appropriate  Insight: Appropriate  Engagement in Group:  Engaged  Modes of Intervention:  Discussion  Additional Comments:  Pt. Stated that his goal for today what to set up outside therapy and find programs so that when he leaves he will have a good support system in place. Pt. Stated that he got set up with a therapist and someone connected with [facility] to assist him with ongoing therapy when he leaves. Pt also stated that not trying to be a perfectionist will also decrease a lot of his symptoms. Pt also said the he learned that consistently setting goals and completing them is also a way to decrease depression and his anxiety.  Aldona Lento 11/10/2012, 9:43 PM

## 2012-11-11 NOTE — Progress Notes (Signed)
Adult Psychoeducational Group Note  Date:  11/11/2012 Time:  1:15PM Group Topic/Focus:  Coping With Mental Health Crisis:   The purpose of this group is to help patients identify strategies for coping with mental health crisis.  Group discusses possible causes of crisis and ways to manage them effectively.  Participation Level:  Active  Participation Quality:  Appropriate and Attentive  Affect:  Appropriate  Cognitive:  Alert and Appropriate  Insight: Appropriate  Engagement in Group:  Engaged  Modes of Intervention:  Discussion  Additional Comments:   Pt was attentive and appropriate during today's group discussion. Pt was able to discuss Healthy coping skills. Pt was able to complete Love Language Quiz. Pt. Stated that his Love Language is Quality of Time, However this is something he also struggle with. Pt stated that he is always willing to put things off for work. However by always doing this cause conflict with his partner. Pt stated that he is learning to say no and however try to do things when scheduled instead of rescheduling.   Bing Plume D 11/11/2012, 3:27 PM

## 2012-11-11 NOTE — Progress Notes (Signed)
[facility] MD Progress Note  11/11/2012 2:36 PM Matthew Hutchinson  MRN:  [id number] Subjective:  Sleep improved some, appetite improving, CPAP from home being used and more comfortable for the patient, brighter affect, no suicidal/homicidal ideations, he and his partner have worked together and delegated his work load and placing limits on work to 8 hour limit and no after calls, his partner is supportive and want him to be physically and mentally healthy.  Diagnosis:   Axis I: Anxiety Disorder NOS and Major Depression, Recurrent severe Axis II: Deferred Axis III:  Past Medical History  Diagnosis Date  . Unspecified essential hypertension   . OSA (obstructive sleep apnea)   . Morbid obesity   . Depression   . Anxiety    Axis IV: occupational problems, other psychosocial or environmental problems, problems related to social environment and problems with primary support group Axis V: 41-50 serious symptoms  ADL's:  Intact  Sleep: Fair  Appetite:  Fair  Suicidal Ideation:  Denies Homicidal Ideation:  Denies  Psychiatric Specialty Exam: Review of Systems  Constitutional: Negative.   HENT: Negative.   Eyes: Negative.   Respiratory: Negative.   Cardiovascular: Negative.   Gastrointestinal: Negative.   Genitourinary: Negative.   Musculoskeletal: Negative.   Skin: Negative.   Neurological: Negative.   Endo/Heme/Allergies: Negative.   Psychiatric/Behavioral: Positive for depression. The patient is nervous/anxious.     Blood pressure 148/82, pulse 83, temperature 97.6 F (36.4 C), temperature source Oral, resp. rate 16, height 5\' 10"  (1.778 m), weight 132.45 kg (292 lb).Body mass index is 41.9 kg/(m^2).  General Appearance: Casual  Eye Contact::  Fair  Speech:  Normal Rate  Volume:  Normal  Mood:  Anxious and Depressed  Affect:  Congruent  Thought Process:  Coherent  Orientation:  Full (Time, Place, and Person)  Thought Content:  WDL  Suicidal Thoughts:  No  Homicidal Thoughts:   No  Memory:  Immediate;   Fair Recent;   Fair Remote;   Fair  Judgement:  Fair  Insight:  Fair  Psychomotor Activity:  Decreased  Concentration:  Fair  Recall:  Fair  Akathisia:  No  Handed:  Right  AIMS (if indicated):     Assets:  Communication Skills Resilience Social Support  Sleep:  Number of Hours: 6.5   Current Medications: Current Facility-Administered Medications  Medication Dose Route Frequency Provider Last Rate Last Dose  . acetaminophen (TYLENOL) tablet 650 mg  650 mg Oral Q6H PRN Kerry Hough, PA-C      . ALPRAZolam Prudy Feeler) tablet 1 mg  1 mg Oral TID PRN Kerry Hough, PA-C      . alum & mag hydroxide-simeth (MAALOX/MYLANTA) 200-200-20 MG/5ML suspension 30 mL  30 mL Oral Q4H PRN Kerry Hough, PA-C      . aspirin chewable tablet 81 mg  81 mg Oral Daily Kerry Hough, PA-C   81 mg at 11/11/12 0830  . cholecalciferol (VITAMIN D) tablet 2,000 Units  2,000 Units Oral Daily Kerry Hough, PA-C   2,000 Units at 11/11/12 0830  . famotidine (PEPCID) tablet 20 mg  20 mg Oral BID PRN Kerry Hough, PA-C      . gabapentin (NEURONTIN) capsule 900 mg  900 mg Oral TID Kerry Hough, PA-C   900 mg at 11/11/12 1151  . hydrOXYzine (ATARAX/VISTARIL) tablet 25 mg  25 mg Oral Q4H PRN Nanine Means, NP      . magnesium gluconate (MAGONATE) tablet 500 mg  500 mg  Oral Daily Kerry Hough, PA-C   500 mg at 11/11/12 0830  . magnesium hydroxide (MILK OF MAGNESIA) suspension 30 mL  30 mL Oral Daily PRN Kerry Hough, PA-C      . multivitamin with minerals tablet 1 tablet  1 tablet Oral Daily Himabindu Ravi, MD   1 tablet at 11/11/12 0832  . nitroGLYCERIN (NITROSTAT) SL tablet 0.4 mg  0.4 mg Sublingual Q5 min PRN Kerry Hough, PA-C      . sertraline (ZOLOFT) tablet 25 mg  25 mg Oral Daily Himabindu Ravi, MD   25 mg at 11/11/12 6644  . simvastatin (ZOCOR) tablet 20 mg  20 mg Oral QHS Kerry Hough, PA-C   20 mg at 11/10/12 2126  . traZODone (DESYREL) tablet 50 mg  50 mg Oral  QHS,MR X 1 Kerry Hough, PA-C   50 mg at 11/10/12 2202    Lab Results: No results found for this or any previous visit (from the past 48 hour(s)).  Physical Findings: AIMS: Facial and Oral Movements Muscles of Facial Expression: None, normal Lips and Perioral Area: None, normal Jaw: None, normal Tongue: None, normal,Extremity Movements Upper (arms, wrists, hands, fingers): None, normal Lower (legs, knees, ankles, toes): None, normal, Trunk Movements Neck, shoulders, hips: None, normal, Overall Severity Severity of abnormal movements (highest score from questions above): None, normal Incapacitation due to abnormal movements: None, normal Patient's awareness of abnormal movements (rate only patient's report): No Awareness, Dental Status Current problems with teeth and/or dentures?: No Does patient usually wear dentures?: No  CIWA:  CIWA-Ar Total: 0 COWS:     Treatment Plan Summary: Daily contact with patient to assess and evaluate symptoms and progress in treatment Medication management  Plan:  Review of chart, vital signs, medications, and notes. 1-Individual and group therapy 2-Medication management for depression and anxiety:  Medications reviewed with the patient and he stated no untoward effects, no changes made 3-Coping skills for depression and anxiety 4-Continue crisis stabilization and management 5-Address health issues--monitoring vital signs, stable 6-Treatment plan in progress to prevent relapse of depression and anxiety  Medical Decision Making Problem Points:  Established problem, stable/improving (1) and Review of psycho-social stressors (1) Data Points:  Review of medication regiment & side effects (2)  I certify that inpatient services furnished can reasonably be expected to improve the patient's condition.   Nanine Means, PMH-NP 11/11/2012, 2:36 PM

## 2012-11-11 NOTE — Progress Notes (Signed)
[facility] Group Notes:  (Nursing/MHT/Case Management/Adjunct)  Date:  11/11/2012  Time:  11:15 PM  Type of Therapy:  Group Therapy  Participation Level:  Active  Participation Quality:  Appropriate  Affect:  Appropriate  Cognitive:  Appropriate  Insight:  Appropriate  Engagement in Group:  Improving  Modes of Intervention:  Support  Summary of Progress/Problems:Dealing with situation better  Matthew Hutchinson 11/11/2012, 11:15 PM

## 2012-11-11 NOTE — Progress Notes (Signed)
D) Pt rates his depression and hopelessness both at a 1. Denies SI and HI. States that he is trying to get his life back in order. Feels that his life partner and he have a good relationship and there are no problems there. States the problem is with his work partner. Feels frustrated and irritated that he is not working as hard as the Pt. Is. Making arrangements for someone to take over part of his responsabilities at work. A) Given respect and reassurance along with praise. Given homework to find out what he gets out of working so very hard. R) Denies SI and HI.

## 2012-11-11 NOTE — Progress Notes (Signed)
 .  Psychoeducational Group Note    Date: 11/11/2012 Time:0900  Goal Setting Purpose of Group: To be able to set a goal that is measurable and that can be accomplished in one day Participation Level:  Active  Participation Quality:  Attentive  Affect:  Appropriate  Cognitive:  Oriented  Insight:  Engaged  Engagement in Group:  Engaged  Additional Comments:  Pt made a goal of "remember at lunch to buy bottled water at lunch today".  Dione Housekeeper

## 2012-11-11 NOTE — [facility] Group Notes (Signed)
[facility] Group Notes:  (Clinical Social Work)  11/11/2012   3:00-4:00PM  Summary of Progress/Problems:   The main focus of today's process group was for the patient to identify something in their life that led to their hospitalization that they would like to change, then to discuss their motivation to change.  The Stages of Change were explained to the group, then each patient identified where they are in that process.  Scale was used with motivational interviewing to determine the patient's current motivation to change, with 1 being total lack of motivation and 10 being total commitment to change.  The patient expressed that the behavior he wants to change is to work less hours, which he feels will help with his insomnia, depression, and anxiety, as well as racing thoughts.  He tends to explode toward himself, and knows this is not helpful.  He is in preparation stage, and has already set a meeting with the company to work on developing new job descriptions that will shift some responsibilities off his shoulders.  He has the support of his partner of 19 years for this.  His motivation is 10 out of 10.  Type of Therapy:  Process Group  Participation Level:  Active  Participation Quality:  Appropriate, Attentive, Sharing and Supportive  Affect:  Appropriate and Blunted  Cognitive:  Alert, Appropriate and Oriented  Insight:  Engaged  Engagement in Therapy:  Engaged  Modes of Intervention:  Clarification, Support and Processing, Exploration, Discussion   Ambrose Mantle, LCSW 11/11/2012, 4:47 PM

## 2012-11-12 DIAGNOSIS — F411 Generalized anxiety disorder: Secondary | ICD-10-CM

## 2012-11-12 DIAGNOSIS — F332 Major depressive disorder, recurrent severe without psychotic features: Principal | ICD-10-CM

## 2012-11-12 MED ORDER — TRAZODONE HCL 100 MG PO TABS
100.0000 mg | ORAL_TABLET | Freq: Every evening | ORAL | Status: DC | PRN
  Administered 2012-11-12: 100 mg via ORAL
  Filled 2012-11-12: qty 1

## 2012-11-12 NOTE — Progress Notes (Signed)
Writer observed patient sitting in the dayroom watching tv and interacting appropriately with peers. Writer spoke with patient 1:1 and he voiced concern with him not sleeping well the previous nights. Writer informed patient of available prn medications available and he decided to try taking a visteril to possibly help with his anxiety and then start his trazadone with his repeat to see if this would help him rest. Patient is agreeable to plan and if thisd does not work he will speak to the doctor on tomorrow to see if his trazadone needs to be increased. Support and encouragement offered. Patient reports that his plan after discharge is to reduce his work load and delegate more to other workers so that he can take better care of himself. Patient currently denies si/hi/a/v hallucinations. Safety maintained on unit with 15 min checks, will continue to monitor.

## 2012-11-12 NOTE — Progress Notes (Signed)
Reviewed the information documented and agree with the treatment plan.  Endrit Gittins,JANARDHAHA R. 11/12/2012 1:30 PM 

## 2012-11-12 NOTE — Progress Notes (Signed)
Patient ID: Matthew Hutchinson, male   DOB: [birth year], 52 y.o.   MRN: [id number] [facility] MD Progress Note  11/12/2012 4:17 PM ARTAVIS COWIE  MRN:  [id number] Subjective:  "Pretty well today." and "vistaril helped a lot with his sleeping." Objective: Patient up and active in the unit milieu. Rates depression 1/10 and anxiety as 2/10. Good eye contact, speech normal, reports sleep is good, appetite is fair. He is anticipating d/c on Monday Morning. Diagnosis:   Axis I: Anxiety Disorder NOS and Major Depression, Recurrent severe Axis II: Deferred Axis III:  Past Medical History  Diagnosis Date  . Unspecified essential hypertension   . OSA (obstructive sleep apnea)   . Morbid obesity   . Depression   . Anxiety    Axis IV: occupational problems, other psychosocial or environmental problems, problems related to social environment and problems with primary support group Axis V: 41-50 serious symptoms  ADL's:  Intact  Sleep: "good, not great."  Appetite:  Fair  Suicidal Ideation:  Denies Homicidal Ideation:  Denies  Psychiatric Specialty Exam: Review of Systems  Constitutional: Negative.   HENT: Negative.   Eyes: Negative.   Respiratory: Negative.   Cardiovascular: Negative.   Gastrointestinal: Negative.   Genitourinary: Negative.   Musculoskeletal: Negative.   Skin: Negative.   Neurological: Negative.   Endo/Heme/Allergies: Negative.   Psychiatric/Behavioral: Positive for depression. The patient is nervous/anxious.     Blood pressure 153/83, pulse 88, temperature 97.6 F (36.4 C), temperature source Oral, resp. rate 18, height 5\' 10"  (1.778 m), weight 132.45 kg (292 lb).Body mass index is 41.9 kg/(m^2).  General Appearance: Casual  Eye Contact::  good  Speech:  Normal Rate  Volume:  Normal  Mood:  Euthymic   Affect:  Congruent  Thought Process:  Coherent  Orientation:  Full (Time, Place, and Person)  Thought Content:  WDL  Suicidal Thoughts:  No  Homicidal Thoughts:  No   Memory:  Immediate;   Fair Recent;   Fair Remote;   Fair  Judgement:  Fair  Insight:  Fair  Psychomotor Activity:  Decreased  Concentration:  Fair  Recall:  Fair  Akathisia:  No  Handed:  Right  AIMS (if indicated):     Assets:  Communication Skills Resilience Social Support  Sleep:  Number of Hours: 6   Current Medications: Current Facility-Administered Medications  Medication Dose Route Frequency Provider Last Rate Last Dose  . acetaminophen (TYLENOL) tablet 650 mg  650 mg Oral Q6H PRN Kerry Hough, PA-C      . ALPRAZolam Prudy Feeler) tablet 1 mg  1 mg Oral TID PRN Kerry Hough, PA-C      . alum & mag hydroxide-simeth (MAALOX/MYLANTA) 200-200-20 MG/5ML suspension 30 mL  30 mL Oral Q4H PRN Kerry Hough, PA-C      . aspirin chewable tablet 81 mg  81 mg Oral Daily Kerry Hough, PA-C   81 mg at 11/12/12 7829  . cholecalciferol (VITAMIN D) tablet 2,000 Units  2,000 Units Oral Daily Kerry Hough, PA-C   2,000 Units at 11/12/12 5621  . famotidine (PEPCID) tablet 20 mg  20 mg Oral BID PRN Kerry Hough, PA-C      . gabapentin (NEURONTIN) capsule 900 mg  900 mg Oral TID Kerry Hough, PA-C   900 mg at 11/12/12 1207  . hydrOXYzine (ATARAX/VISTARIL) tablet 25 mg  25 mg Oral Q4H PRN Nanine Means, NP   25 mg at 11/11/12 2201  . magnesium gluconate (MAGONATE)  tablet 500 mg  500 mg Oral Daily Kerry Hough, PA-C   500 mg at 11/12/12 1610  . magnesium hydroxide (MILK OF MAGNESIA) suspension 30 mL  30 mL Oral Daily PRN Kerry Hough, PA-C      . multivitamin with minerals tablet 1 tablet  1 tablet Oral Daily Himabindu Ravi, MD   1 tablet at 11/12/12 0809  . nitroGLYCERIN (NITROSTAT) SL tablet 0.4 mg  0.4 mg Sublingual Q5 min PRN Kerry Hough, PA-C      . sertraline (ZOLOFT) tablet 25 mg  25 mg Oral Daily Himabindu Ravi, MD   25 mg at 11/12/12 0809  . simvastatin (ZOCOR) tablet 20 mg  20 mg Oral QHS Kerry Hough, PA-C   20 mg at 11/11/12 2201  . traZODone (DESYREL) tablet  50 mg  50 mg Oral QHS,MR X 1 Kerry Hough, PA-C   50 mg at 11/11/12 2304    Lab Results: No results found for this or any previous visit (from the past 48 hour(s)).  Physical Findings: AIMS: Facial and Oral Movements Muscles of Facial Expression: None, normal Lips and Perioral Area: None, normal Jaw: None, normal Tongue: None, normal,Extremity Movements Upper (arms, wrists, hands, fingers): None, normal Lower (legs, knees, ankles, toes): None, normal, Trunk Movements Neck, shoulders, hips: None, normal, Overall Severity Severity of abnormal movements (highest score from questions above): None, normal Incapacitation due to abnormal movements: None, normal Patient's awareness of abnormal movements (rate only patient's report): No Awareness, Dental Status Current problems with teeth and/or dentures?: No Does patient usually wear dentures?: No  CIWA:  CIWA-Ar Total: 0 COWS:     Treatment Plan Summary: Daily contact with patient to assess and evaluate symptoms and progress in treatment Medication management  Plan:  Review of chart, vital signs, medications, and notes. 1-Individual and group therapy 2-Medication management for depression and anxiety:  Medications reviewed with the patient and he stated no untoward effects, no changes made 3-Coping skills for depression and anxiety 4-Continue crisis stabilization and management 5-Address health issues--monitoring vital signs, stable 6-Treatment plan in progress to prevent relapse of depression and anxiety 7. Will continue the current plan of care as written.  8. Patient is anticipating d/c in AM. 9. If continues in patient would consider HCTZ due to increasing diastolic pressures in the 90's. Medical Decision Making Problem Points:  Established problem, stable/improving (1) and Review of psycho-social stressors (1) Data Points:  Review of medication regiment & side effects (2)  I certify that inpatient services furnished can  reasonably be expected to improve the patient's condition.  Rona Ravens. Everrett Lacasse RPAC 4:22 PM 11/12/2012

## 2012-11-12 NOTE — Progress Notes (Signed)
Adult Psychoeducational Group Note  Date:  11/12/2012 Time:  6:55 PM  Group Topic/Focus:  Conflict Resolution:   The focus of this group is to discuss the conflict resolution process and how it may be used upon discharge.  Participation Level:  Active  Participation Quality:  Appropriate and Attentive  Affect:  Appropriate  Cognitive:  Appropriate  Insight: Appropriate  Engagement in Group:  Engaged  Modes of Intervention:  Problem-solving  Additional Comments: Patient stated that his definition of a conflict was "any situation that puts me in immediate emotional response." Patient stated was able to effectively identify and evaluate his current conflict aloud in group.   Lyndee Hensen 11/12/2012, 6:55 PM

## 2012-11-12 NOTE — [facility] Group Notes (Signed)
[facility] Group Notes:  (Clinical Social Work)  11/12/2012   3:00-4:00PM  Summary of Progress/Problems:   The main focus of today's process group was to   identify the patient's current support system and decide on other supports that can be put in place.  Four definitions/levels of support were discussed and an exercise was utilized to show how much stronger we become with additional supports.  An emphasis was placed on using counselor, doctor, therapy groups, 12-step groups, and problem-specific support groups to expand supports, as well as doing something different than has been done before. The patient expressed a willingness to ADD going back to the therapist he has not seen since 2012.  He also wants to check out support groups and is interested in possibly going through a peer-to-peer program with the intent to later become one himself.  He already has a psychiatrist in place also.  Type of Therapy:  Process Group  Participation Level:  Active  Participation Quality:  Appropriate, Attentive, Sharing and Supportive  Affect:  Blunted and Depressed  Cognitive:  Alert, Appropriate and Oriented  Insight:  Engaged  Engagement in Therapy:  Engaged  Modes of Intervention:  Education,  Support and [employer], LCSW 11/12/2012, 5:20 PM

## 2012-11-12 NOTE — Progress Notes (Signed)
D) Pt has been doing well today. Has attended the groups and interacts with his peers appropriately. Rates his depression and hopelessness both at a 1 and denies SI and HI. States that he has everything in place for when he goes home and all day Monday he is not going to do anything expect be at home. States he then is going to put things into place where others can take over some of his responsibilities so that he will be working only 8 hours a day. A) Given support, reassurance and praised for his ability to recognize his issues and to begin some of the changes here. R) Denies SI and HI.

## 2012-11-13 MED ORDER — ASPIRIN 81 MG PO TABS: 81.0000 mg | ORAL_TABLET | Freq: Every day | ORAL | Status: AC

## 2012-11-13 MED ORDER — SERTRALINE HCL 25 MG PO TABS: 25.0000 mg | ORAL_TABLET | Freq: Every day | ORAL | Status: DC

## 2012-11-13 MED ORDER — VITAMIN D 50 MCG (2000 UT) PO TABS: 2000.0000 [IU] | ORAL_TABLET | Freq: Every day | ORAL | Status: DC

## 2012-11-13 MED ORDER — HYDROXYZINE HCL 25 MG PO TABS: 25.0000 mg | ORAL_TABLET | ORAL | Status: DC | PRN

## 2012-11-13 MED ORDER — MULTIVITAMINS PO CAPS: 1.0000 | ORAL_CAPSULE | Freq: Every day | ORAL | Status: DC

## 2012-11-13 MED ORDER — GABAPENTIN 300 MG PO CAPS: 900.0000 mg | ORAL_CAPSULE | Freq: Three times a day (TID) | ORAL | Status: DC

## 2012-11-13 MED ORDER — TRAZODONE HCL 100 MG PO TABS: 100.0000 mg | ORAL_TABLET | Freq: Every evening | ORAL | Status: DC | PRN

## 2012-11-13 MED ORDER — SIMVASTATIN 20 MG PO TABS: 20.0000 mg | ORAL_TABLET | Freq: Every day | ORAL | Status: DC

## 2012-11-13 MED ORDER — NITROGLYCERIN 0.4 MG SL SUBL: 0.4000 mg | SUBLINGUAL_TABLET | SUBLINGUAL | Status: AC | PRN

## 2012-11-13 NOTE — [facility] Suicide Risk Assessment (Signed)
Suicide Risk Assessment  Discharge Assessment     Demographic Factors:  Male and Caucasian  Mental Status Per Nursing Assessment::   On Admission:  Suicidal ideation indicated by patient;Thoughts of violence towards others  Current Mental Status by Physician: Patient alert and oriented to 4. Denies AH/VH/SI/HI  Loss Factors: Financial problems/change in socioeconomic status  Historical Factors: Impulsivity  Risk Reduction Factors:   Employed  Continued Clinical Symptoms:  Depression:   Recent sense of peace/wellbeing  Cognitive Features That Contribute To Risk:  Cognitively intact  Suicide Risk:  Minimal: No identifiable suicidal ideation.  Patients presenting with no risk factors but with morbid ruminations; may be classified as minimal risk based on the severity of the depressive symptoms  Discharge Diagnoses:   AXIS I:  Major Depression, Recurrent severe AXIS II:  Deferred AXIS III:   Past Medical History  Diagnosis Date  . Unspecified essential hypertension   . OSA (obstructive sleep apnea)   . Morbid obesity   . Depression   . Anxiety    AXIS IV:  occupational problems and other psychosocial or environmental problems AXIS V:  61-70 mild symptoms  Plan Of Care/Follow-up recommendations:  Activity:  As tolerated Diet:  regular Follow up with outpatient appointments.  Is patient on multiple antipsychotic therapies at discharge:  No   Has Patient had three or more failed trials of antipsychotic monotherapy by history:  No  Recommended Plan for Multiple Antipsychotic Therapies: NA  Matthew Hutchinson 11/13/2012, 10:26 AM

## 2012-11-13 NOTE — Progress Notes (Signed)
Date: 11/13/2012  Time: 11:00am  Group Topic/Focus:  Making Healthy Choices: The focus of this group is to help patients identify negative/unhealthy choices they were using prior to admission and identify positive/healthier coping strategies to replace them upon discharge.  Participation Level: Active  Participation Quality: Appropriate, Sharing and Supportive  Affect: Appropriate  Cognitive: Appropriate  Insight: Appropriate  Engagement in Group: Engaged and Supportive  Modes of Intervention: Discussion, Education, Problem-solving and Support  Additional Comments: none  Matthew Hutchinson  11/13/2012, 1:25 PM  

## 2012-11-13 NOTE — Tx Team (Signed)
Interdisciplinary Treatment Plan Update   Date Reviewed:  11/13/2012  Time Reviewed:  9:59 AM  Progress in Treatment:   Attending groups: Yes Participating in groups: Yes Taking medication as prescribed: Yes  Tolerating medication: Yes Family/Significant other contact made: Yes, contact made with partner Patient understands diagnosis: Yes  Discussing patient identified problems/goals with staff: Yes Medical problems stabilized or resolved: Yes Denies suicidal/homicidal ideation: Yes Patient has not harmed self or others: Yes  For review of initial/current patient goals, please see plan of care.  Estimated Length of Stay:  Discharge home today  Reasons for Continued Hospitalization:   New Problems/Goals identified:    Discharge Plan or Barriers:   Home with outpatient follow up  [facility]  Additional Comments:  Patient reports he is doing well and looks forward to discharging home today.  He reports the medications are working. Patient is denying SI/HI and rates all symptoms at zero.  Attendees:  Patient: Matthew Hutchinson 11/13/2012 9:59 AM   Signature: Patrick North, MD 11/13/2012 9:59 AM  Signature: 11/13/2012 9:59 AM  Signature: 11/13/2012 9:59 AM  Signature:Beverly Terrilee Croak, RN 11/13/2012 9:59 AM  Signature:  Neill Loft RN 11/13/2012 9:59 AM  Signature:  Juline Patch, LCSW 11/13/2012 9:59 AM  Signature: Silverio Decamp, PMH-NP 11/13/2012 9:59 AM  Signature:  11/13/2012 9:59 AM  Signature:  11/13/2012 9:59 AM  Signature:    Signature:    Signature:      Scribe for Treatment Team:   Juline Patch,  11/13/2012 9:59 AM

## 2012-11-13 NOTE — Progress Notes (Signed)
Patient has been up and active on the unit, attended group this evening and has voiced no complaints. Patient currently denies having pain, -si/hi/a/v hall. Patient is looking forward to discharge on Monday. He reports that being here has been helpful and he is looking forward to putting his plan into action once discharged. Patient reports that he is going to meet with his co-workers and delegate more duties so that he will not be working long hours every day. Support and encouragement offered, safety maintained on unit, will continue to monitor.

## 2012-11-13 NOTE — Progress Notes (Addendum)
Discharge Note:  Patient discharged home with friend.  Denied SI and HI.   Denied A/V hallucinations.  Denied pain.  Patient stated he appreciated all assistance received from staff while at [facility]. Patient received all his belongings, clothing, miscellaneous items, toiletries, prescriptions.  Patient also received his cpap.   Suicide prevention information given to patient and discussed with patient who stated he understood and had no questions.

## 2012-11-13 NOTE — Discharge Summary (Signed)
Physician Discharge Summary Note  Patient:  Matthew Hutchinson is an 52 y.o., male MRN:  [id number] DOB:  [birth year] Patient phone:  [phone] (home)  Patient address:   [address] [place] [address],   Date of Admission:  11/08/2012 Date of Discharge: 11/15/2012  Reason for Admission:  Depression with suicidal ideations  Discharge Diagnoses: Active Problems:   Depressive disorder, not elsewhere classified   HYPERTENSION  Review of Systems  Constitutional: Negative.   HENT: Negative.   Eyes: Negative.   Respiratory: Negative.   Cardiovascular: Negative.   Gastrointestinal: Negative.   Genitourinary: Negative.   Musculoskeletal: Negative.   Skin: Negative.   Neurological: Negative.   Endo/Heme/Allergies: Negative.   Psychiatric/Behavioral: Positive for depression. The patient is nervous/anxious.    Axis Diagnosis:   AXIS I:  Anxiety Disorder NOS and Major Depression, Recurrent severe AXIS II:  Deferred AXIS III:   Past Medical History  Diagnosis Date  . Unspecified essential hypertension   . OSA (obstructive sleep apnea)   . Morbid obesity   . Depression   . 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 This partner is the only one who can write the checks and is frustrated over his lack of responsibility and his mental disorder. Lavonta is over worked and trying to get people trained. His partner is "pushing his buttons constantly and laughing about it." Monday evening he "could not turn off and kept taking pills to try and get to sleep and by the time he realized it, he had taken 12-18. Not trying to suicide but trying to turn off his thinking. He began having thoughts of strangling his business partner." Pax knew he needed help and drove himself to the  hospital. "Work, stress, and pressures are ever increasing and as I take on a bigger role", he feels the burden of taking care of everyone else.  During hospitalization:  Medications managed--Xanax 0.5-2 mg at bedtime for anxiety/sleep not continued nor Nuvigil 150 mg PRN for narcolepsy, Ambien 10 mg PRN sleep, and multiple vitamins.  Vistaril 25 mg for anxiety PRN q4H started, Zoloft 25 mg for depression, and trazodone 100 mg at bedtime PRN sleep.  Kyandre attended and participated in groups--stress reduction techniques taught (deep breathing) and exercises to practice given to the patient.  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  Patient denied suicidal/homicidal ideations and auditory/visual hallucinations, follow-up appointments encouraged to attend, Rx given.  Niquan is mentally and physically stable for discharge.  Consults:  None  Significant Diagnostic Studies:  labs: completed and reviewed, stable  Discharge Vitals:   Blood pressure 120/81, pulse 80, temperature 97.5 F (36.4 C), temperature source Oral, resp. rate 16, height 5\' 10"  (1.778 m), weight 132.45 kg (292 lb). Body mass index is 41.9 kg/(m^2). Lab Results:   No results found for this or any previous visit (from the past 72 hour(s)).  Physical Findings: AIMS: Facial and Oral Movements Muscles of Facial Expression: None, normal Lips and Perioral Area: None, normal Jaw: None, normal Tongue: None, normal,Extremity Movements Upper (arms, wrists, hands, fingers): None, normal Lower (legs, knees,  ankles, toes): None, normal, Trunk Movements Neck, shoulders, hips: None, normal, Overall Severity Severity of abnormal movements (highest score from questions above): None, normal Incapacitation due to abnormal movements: None, normal Patient's awareness of abnormal movements (rate  only patient's report): No Awareness, Dental Status Current problems with teeth and/or dentures?: No Does patient usually wear dentures?: No  CIWA:  CIWA-Ar Total: 0 COWS:  COWS Total Score: 0  Psychiatric Specialty Exam: See Psychiatric Specialty Exam and Suicide Risk Assessment completed by Attending Physician prior to discharge.  Discharge destination:  Home  Is patient on multiple antipsychotic therapies at discharge:  No   Has Patient had three or more failed trials of antipsychotic monotherapy by history:  No Recommended Plan for Multiple Antipsychotic Therapies:  N/A  Discharge Orders   Future Appointments Provider Department Dept Phone   01/03/2013 1:30 PM Barbaraann Share, MD  Pulmonary Care [phone]   04/30/2013 8:30 AM Jonita Albee, MD [facility] [phone]   Future Orders Complete By Expires     Activity as tolerated - No restrictions  As directed     Diet - low sodium heart healthy  As directed         Medication List    STOP taking these medications       b complex vitamins capsule     calcium gluconate 500 MG tablet     Garlic 100 MG Tabs     ibuprofen 600 MG tablet  Commonly known as:  ADVIL,MOTRIN     Lutein 40 MG Caps     magnesium gluconate 500 MG tablet  Commonly known as:  MAGONATE     omeprazole 20 MG capsule  Commonly known as:  PRILOSEC     Potassium 99 MG Tabs     Shark Cartilage 740 MG Caps     zolpidem 10 MG tablet  Commonly known as:  AMBIEN      TAKE these medications     Indication   ALPRAZolam 1 MG tablet  Commonly known as:  XANAX  Take 0.5-2 mg by mouth at bedtime as needed for sleep or anxiety.      aspirin 81 MG tablet  Take 1 tablet (81 mg total) by mouth daily.   Indication:  thrombosis prevention     famotidine 20 MG tablet  Commonly known as:  PEPCID  Take 20 mg by mouth 2 (two) times daily as needed for heartburn.      gabapentin 300 MG capsule  Commonly known as:  NEURONTIN  Take  3 capsules (900 mg total) by mouth 3 (three) times daily.   Indication:  Neuropathic Pain     hydrOXYzine 25 MG tablet  Commonly known as:  ATARAX/VISTARIL  Take 1 tablet (25 mg total) by mouth every 4 (four) hours as needed for anxiety.      multivitamin capsule  Take 1 capsule by mouth daily.   Indication:  vitamin deficiencies     nitroGLYCERIN 0.4 MG SL tablet  Commonly known as:  NITROSTAT  Place 1 tablet (0.4 mg total) under the tongue every 5 (five) minutes as needed for chest pain. If chest pain not resolved, after 3 doses 5 minutes apart--call 911   Indication:  Acute Angina Pectoris     NUVIGIL 150 MG tablet  Generic drug:  Armodafinil  Take 150 mg by mouth daily as needed (while driving to stay awake).      sertraline 25 MG tablet  Commonly known as:  ZOLOFT  Take 1 tablet (25 mg total) by mouth daily.   Indication:  Major Depressive Disorder     simvastatin 20 MG tablet  Commonly known as:  ZOCOR  Take 1 tablet (20 mg total) by mouth at bedtime.   Indication:  hyperlipidemia     traZODone 100 MG tablet  Commonly known as:  DESYREL  Take 1 tablet (100 mg total) by mouth at bedtime as needed for sleep.   Indication:  Trouble Sleeping     Vitamin D 2000 UNITS tablet  Take 1 tablet (2,000 Units total) by mouth daily.   Indication:  vitamin D supplement           Follow-up Information   Follow up with Dr. Lolly [facility] On 11/29/2012. (You are scheduled with Dr. Lolly Mustache on Wednesday, October 30, 2012 at 9:00 AM.  Please arrive at [facility] to complete registration forms.)    Contact information:   [address] [place], [place]   [address]  [phone]      Follow up with Felix Pacini. (Will call patient at home when therapist returns call for appointment.)       Follow-up recommendations:  Activity:  As tolerated Diet:  Low-sodium heart healthy diet  Comments:  Patient will continue his care at [facility] with Dr. Lolly Mustache.  Total Discharge  Time:  Greater than 30 minutes.  SignedNanine Means, PMH-NP 11/13/2012, 2:11 PM

## 2012-11-13 NOTE — B[facility] Group Notes (Signed)
[facility] LCSW Aftercare Discharge Planning Group Note   11/13/2012 10:26 AM  Participation Quality:  Appropriate  Mood/Affect:  Appropriate  Depression Rating:  0  Anxiety Rating:  0  Thoughts of Suicide:  No  Will you contract for safety?   NA  Current AVH:  No  Plan for Discharge/Comments:  Patient reports he is doing well and reports being ready to discharge home today.  Transportation Means: Patient has transportation.   Supports:  Patient has a good support system.   Kyanna Mahrt, Joesph July

## 2012-11-13 NOTE — Progress Notes (Signed)
D:  Patient's self inventory sheet, patient has fair sleep, good appetite, normal energy level, good attention span.  Rated depression and hopelessness #1, denied anxiety.  Denied withdrawals.  Denied SI.  Zero pain goal, worst pain #3.  After discharge, plans to "work less hours, set work boundaries."  Does have discharge plans.  No problems taking meds after discharge. A:  Medications administered per MD orders.  Support and encouragement given throughout day.   R:  Following treatment plan.  Denied SI and HI.  Denied A/V hallucinations.  Contracts for safety.

## 2012-11-13 NOTE — Progress Notes (Signed)
[facility] Adult Case Management Discharge Plan :  Will you be returning to the same living situation after discharge: Yes,  Patient to return to his home At discharge, do you have transportation home?:Yes,  Patient will arrange transportation home. Do you have the ability to pay for your medications:Yes,  patient can afford medications.  Release of information consent forms completed and in the chart;  Patient's signature needed at discharge.  Patient to Follow up at: Follow-up Information   Follow up with Dr. Lolly Mustache [facility] On 11/29/2012. (You are scheduled with Dr. Lolly Mustache on Wednesday, October 30, 2012 at 9:00 AM.  Please arrive at [facility] to complete registration forms.)    Contact information:   [address] [place], [place]   [address]  [phone]      Follow up with Felix Pacini. (Will call patient at home when therapist returns call for appointment.)       Patient denies SI/HI:   Patient no longer endorsing SI/HI or other thoughts of self harm.     Safety Planning and Suicide Prevention discussed:  .Reviewed with all patients during discharge planning group  Matthew Hutchinson, Joesph July 11/13/2012, 1:22 PM

## 2012-11-13 NOTE — [facility] Suicide Risk Assessment (Signed)
[facility] INPATIENT:  Family/Significant Other Suicide Prevention Education  Late Entry for 11/10/12  Suicide Prevention Education:  Education Completed; Harlin Rain, Partner, [phone];  has been identified by the patient as the family member/significant other with whom the patient will be residing, and identified as the person(s) who will aid the patient in the event of a mental health crisis (suicidal ideations/suicide attempt).  With written consent from the patient, the family member/significant other has been provided the following suicide prevention education, prior to the and/or following the discharge of the patient.  The suicide prevention education provided includes the following:  Suicide risk factors  Suicide prevention and interventions  National Suicide Hotline telephone number  [facility] assessment telephone number  [facility] Emergency Assistance 911  [facility] and/or Residential Mobile Crisis Unit telephone number  Request made of family/significant other to:  Remove weapons (e.g., guns, rifles, knives), all items previously/currently identified as safety concern.  Patient advised there are no guns in the home.  Remove drugs/medications (over-the-counter, prescriptions, illicit drugs), all items previously/currently identified as a safety concern.  The family member/significant other verbalizes understanding of the suicide prevention education information provided.  The family member/significant other agrees to remove the items of safety concern listed above.  Wynn Banker 11/13/2012, 11:17 AM

## 2012-11-16 NOTE — Progress Notes (Addendum)
Patient Discharge Instructions:  Next Level Care Provider Has Access to the EMR, 11/16/12 Records provided to [facility] via [facility]/Epic access No documentation sent to [facility].  No release available. Jerelene Redden, 11/16/2012, 3:40 PM

## 2012-11-29 ENCOUNTER — Encounter (HOSPITAL_COMMUNITY): Payer: Self-pay | Admitting: Psychiatry

## 2012-11-29 ENCOUNTER — Ambulatory Visit (INDEPENDENT_AMBULATORY_CARE_PROVIDER_SITE_OTHER): Payer: BC Managed Care – PPO | Admitting: Psychiatry

## 2012-11-29 ENCOUNTER — Telehealth (HOSPITAL_COMMUNITY): Payer: Self-pay | Admitting: *Deleted

## 2012-11-29 ENCOUNTER — Other Ambulatory Visit (HOSPITAL_COMMUNITY): Payer: Self-pay | Admitting: Psychiatry

## 2012-11-29 VITALS — BP 132/79 | HR 85 | Ht 74.5 in | Wt 301.2 lb

## 2012-11-29 DIAGNOSIS — F329 Major depressive disorder, single episode, unspecified: Secondary | ICD-10-CM

## 2012-11-29 DIAGNOSIS — F339 Major depressive disorder, recurrent, unspecified: Secondary | ICD-10-CM

## 2012-11-29 MED ORDER — SERTRALINE HCL 50 MG PO TABS: 50.0000 mg | ORAL_TABLET | Freq: Every day | ORAL | Status: DC

## 2012-11-29 MED ORDER — SERTRALINE HCL 25 MG PO TABS: 25.0000 mg | ORAL_TABLET | Freq: Every day | ORAL | Status: DC

## 2012-11-29 MED ORDER — TRAZODONE HCL 100 MG PO TABS: 100.0000 mg | ORAL_TABLET | Freq: Every evening | ORAL | Status: DC | PRN

## 2012-11-29 MED ORDER — HYDROXYZINE HCL 50 MG PO TABS: 50.0000 mg | ORAL_TABLET | Freq: Every day | ORAL | Status: DC

## 2012-11-29 NOTE — Telephone Encounter (Addendum)
VM: States when he saw Dr. Lolly Mustache, Zoloft  And Vistaril doses were changed. He went to pharmacy to pick up and Zoloft dose is not the dose that Dr. Lolly Mustache told him in the office Contacted pt [id number]: Informed patient that MD sent new prescription for Zoloft to pt pharmacy
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
## 2012-12-20 ENCOUNTER — Encounter (HOSPITAL_COMMUNITY): Payer: Self-pay | Admitting: Psychiatry

## 2012-12-20 ENCOUNTER — Ambulatory Visit (INDEPENDENT_AMBULATORY_CARE_PROVIDER_SITE_OTHER): Payer: BC Managed Care – PPO | Admitting: Psychiatry

## 2012-12-20 VITALS — BP 142/70 | HR 72 | Ht 74.5 in | Wt 306.0 lb

## 2012-12-20 DIAGNOSIS — F329 Major depressive disorder, single episode, unspecified: Secondary | ICD-10-CM

## 2012-12-20 MED ORDER — HYDROXYZINE HCL 50 MG PO TABS: ORAL_TABLET | ORAL | Status: DC

## 2012-12-20 MED ORDER — TRAZODONE HCL 100 MG PO TABS: 100.0000 mg | ORAL_TABLET | Freq: Every evening | ORAL | Status: DC | PRN

## 2012-12-20 MED ORDER — SERTRALINE HCL 50 MG PO TABS: 50.0000 mg | ORAL_TABLET | Freq: Every day | ORAL | Status: DC
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
## 2013-01-03 ENCOUNTER — Ambulatory Visit (INDEPENDENT_AMBULATORY_CARE_PROVIDER_SITE_OTHER): Payer: BC Managed Care – PPO | Admitting: Pulmonary Disease

## 2013-01-03 ENCOUNTER — Encounter: Payer: Self-pay | Admitting: Pulmonary Disease

## 2013-01-03 ENCOUNTER — Ambulatory Visit: Payer: BC Managed Care – PPO | Admitting: Pulmonary Disease

## 2013-01-03 VITALS — BP 140/88 | HR 75 | Temp 97.5°F | Ht 74.5 in | Wt 312.4 lb

## 2013-01-03 DIAGNOSIS — G4733 Obstructive sleep apnea (adult) (pediatric): Secondary | ICD-10-CM

## 2013-01-03 NOTE — Assessment & Plan Note (Signed)
The patient is doing very well CPAP, and feels that it is helping his sleep and daytime alertness.  He is due for a new mask and supplies, and I have also encouraged him to work aggressively on weight loss.  He will follow up with me in one year.

## 2013-01-03 NOTE — Patient Instructions (Addendum)
Will send an order for new supplies and a mask Work on weight loss followup with me in a year, but call if having issues with cpap.

## 2013-01-03 NOTE — Progress Notes (Signed)
  Subjective:    Patient ID: Matthew Hutchinson, male    DOB: 1961-04-19, 52 y.o.   MRN: [id number]  HPI Patient comes in today for followup of his obstructive sleep apnea.  He is wearing CPAP compliantly, and is having no issues with his mask fit or pressure.  He is overdue for a new mask.  He feels that he is sleeping well with the device, and has lost 6 pounds since last visit.   Review of Systems  Constitutional: Negative for fever and unexpected weight change.  HENT: Positive for congestion ( nightly when pt lays flat.). Negative for ear pain, nosebleeds, sore throat, rhinorrhea, sneezing, trouble swallowing, dental problem, postnasal drip and sinus pressure.   Eyes: Negative for redness and itching.  Respiratory: Negative for cough, chest tightness, shortness of breath and wheezing.   Cardiovascular: Negative for palpitations and leg swelling.  Gastrointestinal: Negative for nausea and vomiting.  Genitourinary: Negative for dysuria.  Musculoskeletal: Negative for joint swelling.  Skin: Negative for rash.  Neurological: Negative for headaches.  Hematological: Does not bruise/bleed easily.  Psychiatric/Behavioral: Negative for dysphoric mood. The patient is not nervous/anxious.        Objective:   Physical Exam Obese male in no acute distress Nose without purulence or discharge noted No skin breakdown or pressure necrosis from the CPAP mask Neck without lymphadenopathy or thyromegaly Lower extremities without edema, no cyanosis Alert, does not appear to be sleepy, moves all 4 extremities.       Assessment & Plan:

## 2013-01-29 ENCOUNTER — Other Ambulatory Visit (HOSPITAL_COMMUNITY): Payer: Self-pay | Admitting: Psychiatry

## 2013-01-30 ENCOUNTER — Other Ambulatory Visit (HOSPITAL_COMMUNITY): Payer: Self-pay | Admitting: *Deleted

## 2013-01-30 NOTE — Telephone Encounter (Signed)
Refill request for vistaril, has appt 01/31/13, note left for Arfeen in his inbox.

## 2013-01-31 ENCOUNTER — Encounter (HOSPITAL_COMMUNITY): Payer: Self-pay | Admitting: Psychiatry

## 2013-01-31 ENCOUNTER — Ambulatory Visit (INDEPENDENT_AMBULATORY_CARE_PROVIDER_SITE_OTHER): Payer: BC Managed Care – PPO | Admitting: Psychiatry

## 2013-01-31 VITALS — BP 142/82 | HR 78 | Ht 74.5 in | Wt 311.0 lb

## 2013-01-31 DIAGNOSIS — F339 Major depressive disorder, recurrent, unspecified: Secondary | ICD-10-CM

## 2013-01-31 DIAGNOSIS — F329 Major depressive disorder, single episode, unspecified: Secondary | ICD-10-CM

## 2013-01-31 MED ORDER — SERTRALINE HCL 50 MG PO TABS: 50.0000 mg | ORAL_TABLET | Freq: Every day | ORAL | Status: DC

## 2013-01-31 MED ORDER — HYDROXYZINE HCL 50 MG PO TABS: ORAL_TABLET | ORAL | Status: DC

## 2013-01-31 MED ORDER — TRAZODONE HCL 100 MG PO TABS: 100.0000 mg | ORAL_TABLET | Freq: Every evening | ORAL | Status: DC | PRN
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
## 2013-04-03 ENCOUNTER — Ambulatory Visit (HOSPITAL_COMMUNITY): Payer: BC Managed Care – PPO | Admitting: Psychiatry

## 2013-04-03 ENCOUNTER — Encounter (HOSPITAL_COMMUNITY): Payer: Self-pay | Admitting: Psychiatry

## 2013-04-03 ENCOUNTER — Other Ambulatory Visit (HOSPITAL_COMMUNITY): Payer: Self-pay | Admitting: Psychiatry

## 2013-04-03 VITALS — BP 126/77 | HR 77 | Ht 74.5 in | Wt 325.4 lb

## 2013-04-03 DIAGNOSIS — F329 Major depressive disorder, single episode, unspecified: Secondary | ICD-10-CM

## 2013-04-03 DIAGNOSIS — F339 Major depressive disorder, recurrent, unspecified: Secondary | ICD-10-CM

## 2013-04-03 MED ORDER — HYDROXYZINE HCL 50 MG PO TABS: ORAL_TABLET | ORAL | Status: DC

## 2013-04-03 MED ORDER — TRAZODONE HCL 100 MG PO TABS: 100.0000 mg | ORAL_TABLET | Freq: Every evening | ORAL | Status: DC | PRN

## 2013-04-03 MED ORDER — SERTRALINE HCL 50 MG PO TABS: 50.0000 mg | ORAL_TABLET | Freq: Every day | ORAL | Status: DC
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
## 2013-04-30 ENCOUNTER — Ambulatory Visit (INDEPENDENT_AMBULATORY_CARE_PROVIDER_SITE_OTHER): Payer: BC Managed Care – PPO | Admitting: Internal Medicine

## 2013-04-30 ENCOUNTER — Encounter: Payer: Self-pay | Admitting: Internal Medicine

## 2013-04-30 VITALS — BP 126/70 | HR 76 | Temp 97.6°F | Resp 16 | Ht 72.5 in | Wt 328.0 lb

## 2013-04-30 DIAGNOSIS — I10 Essential (primary) hypertension: Secondary | ICD-10-CM

## 2013-04-30 DIAGNOSIS — J449 Chronic obstructive pulmonary disease, unspecified: Secondary | ICD-10-CM

## 2013-04-30 DIAGNOSIS — Z23 Encounter for immunization: Secondary | ICD-10-CM

## 2013-04-30 DIAGNOSIS — G4733 Obstructive sleep apnea (adult) (pediatric): Secondary | ICD-10-CM

## 2013-04-30 DIAGNOSIS — Z7189 Other specified counseling: Secondary | ICD-10-CM

## 2013-04-30 DIAGNOSIS — E785 Hyperlipidemia, unspecified: Secondary | ICD-10-CM

## 2013-04-30 DIAGNOSIS — I25810 Atherosclerosis of coronary artery bypass graft(s) without angina pectoris: Secondary | ICD-10-CM

## 2013-04-30 DIAGNOSIS — J441 Chronic obstructive pulmonary disease with (acute) exacerbation: Secondary | ICD-10-CM

## 2013-04-30 LAB — COMPREHENSIVE METABOLIC PANEL
ALT: 32 U/L (ref 0–53)
AST: 34 U/L (ref 0–37)
Chloride: 105 mEq/L (ref 96–112)
Creat: 1.06 mg/dL (ref 0.50–1.35)
Sodium: 138 mEq/L (ref 135–145)
Total Bilirubin: 0.6 mg/dL (ref 0.3–1.2)
Total Protein: 6.6 g/dL (ref 6.0–8.3)

## 2013-04-30 NOTE — Patient Instructions (Signed)

## 2013-04-30 NOTE — Progress Notes (Signed)
  Subjective:    Patient ID: Matthew Hutchinson, male    DOB: 05-27-1961, 52 y.o.   MRN: [id number]  HPI Gained lost weight, OSA stable not great, HTN controlled, CAD stable no nitro used. Had bad spell with deoression, now better and planning life more for his health and pleasure. No side affects from meds. Chronic pain controlled gabapentin  Review of Systems     Objective:   Physical Exam  Constitutional: He is oriented to person, place, and time. He appears well-developed and well-nourished.  HENT:  Head: Normocephalic.  Right Ear: External ear normal.  Left Ear: External ear normal.  Eyes: EOM are normal. Pupils are equal, round, and reactive to light.  Neck: Normal range of motion. Neck supple.  Cardiovascular: Normal rate, regular rhythm and normal heart sounds.   Pulmonary/Chest: Effort normal and breath sounds normal.  Musculoskeletal: He exhibits tenderness.  Neurological: He is alert and oriented to person, place, and time. He exhibits normal muscle tone. Coordination normal.  Psychiatric: He has a normal mood and affect. His behavior is normal.    cmet      Assessment & Plan:  RF meds 1 yr

## 2013-05-08 ENCOUNTER — Other Ambulatory Visit: Payer: Self-pay

## 2013-05-08 MED ORDER — LOSARTAN POTASSIUM-HCTZ 50-12.5 MG PO TABS: 1.0000 | ORAL_TABLET | Freq: Every day | ORAL | Status: DC

## 2013-05-28 ENCOUNTER — Other Ambulatory Visit (HOSPITAL_COMMUNITY): Payer: Self-pay | Admitting: Psychiatry

## 2013-05-30 ENCOUNTER — Other Ambulatory Visit (HOSPITAL_COMMUNITY): Payer: Self-pay | Admitting: Psychiatry

## 2013-06-12 ENCOUNTER — Ambulatory Visit (INDEPENDENT_AMBULATORY_CARE_PROVIDER_SITE_OTHER): Payer: BC Managed Care – PPO | Admitting: Psychiatry

## 2013-06-12 ENCOUNTER — Encounter (HOSPITAL_COMMUNITY): Payer: Self-pay | Admitting: Psychiatry

## 2013-06-12 VITALS — Wt 328.0 lb

## 2013-06-12 DIAGNOSIS — F339 Major depressive disorder, recurrent, unspecified: Secondary | ICD-10-CM

## 2013-06-12 DIAGNOSIS — F329 Major depressive disorder, single episode, unspecified: Secondary | ICD-10-CM

## 2013-06-12 MED ORDER — TRAZODONE HCL 100 MG PO TABS: 100.0000 mg | ORAL_TABLET | Freq: Every evening | ORAL | Status: DC | PRN

## 2013-06-12 MED ORDER — SERTRALINE HCL 50 MG PO TABS: 50.0000 mg | ORAL_TABLET | Freq: Every day | ORAL | Status: DC

## 2013-06-12 MED ORDER — HYDROXYZINE HCL 50 MG PO TABS: ORAL_TABLET | ORAL | Status: DC

## 2013-06-12 NOTE — Progress Notes (Signed)
[facility] [address] Progress Note  Matthew Hutchinson [id number] 52 y.o.  06/12/2013 1:39 PM  Chief Complaint:  Medication management and followup   History of Present Illness: Matthew Hutchinson came for his appointment .  He is compliant with his medication.  He denies any side effects.  He continues to have some issues with his partner but overall things are better.  He had a good Thanksgiving.  He is happy because he's getting married with his partner next April.  He is sleeping better.  He denies any irritability anger or any mood swing.  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  He continues to see therapist.  He recently saw his primary care physician and there are no changes in his current medication.  He denies any agitation, anger or any mood swing.  He denies paranoia or any sedation.  He is not drinking or using any illegal substance.    Suicidal Ideation: No Plan Formed: No Patient has means to carry out plan: No  Homicidal Ideation: No Plan Formed: No Patient has means to carry out plan: No  Review of Systems: Psychiatric: Agitation: No Hallucination: No Depressed Mood: No Insomnia: No Hypersomnia: No Altered Concentration: No Feels Worthless: No Grandiose Ideas: No Belief In Special Powers: No New/Increased Substance Abuse: No Compulsions: No  Neurologic: Headache: No Seizure: No Paresthesias: No   Medical history.  Patient has hypertension, obesity, angina, coronary artery disease, hyperlipidemia and obstructive sleep apnea. His primary care physician is Dr. [name]. Patient has no history of seizures.   Outpatient Encounter Prescriptions as of 06/12/2013  Medication Sig  . Armodafinil (NUVIGIL) 150 MG tablet Take 150 mg by mouth daily.  Marland Kitchen aspirin 81 MG tablet Take 1 tablet (81 mg total) by mouth daily.  Marland Kitchen b complex vitamins tablet Take 1 tablet by mouth daily.  . calcium gluconate 500 MG tablet Take 500 mg by mouth daily.  . Cholecalciferol  (VITAMIN D) 2000 UNITS tablet Take 1 tablet (2,000 Units total) by mouth daily.  Marland Kitchen DIETARY MANAGE PROD - DIET AID PO Take 2 capsules by mouth daily.  . famotidine (PEPCID) 20 MG tablet Take 20 mg by mouth 2 (two) times daily as needed for heartburn.   . gabapentin (NEURONTIN) 300 MG capsule Take 3 capsules (900 mg total) by mouth 3 (three) times daily.  Marland Kitchen GARCINIA CAMBOGIA-CHROMIUM PO Take 2,000 capsules by mouth 2 (two) times daily with a meal.  . hydrOXYzine (ATARAX/VISTARIL) 50 MG tablet TAKE 2-3 TAB AS NEEDED FOR INSOMNIA  . losartan-hydrochlorothiazide (HYZAAR) 50-12.5 MG per tablet Take 1 tablet by mouth daily.  . Lutein 40 MG CAPS Take 1 capsule by mouth daily.  . magnesium gluconate (MAGONATE) 500 MG tablet Take 500 mg by mouth daily.  . Multiple Vitamin (MULTIVITAMIN) capsule Take 1 capsule by mouth daily.  . nitroGLYCERIN (NITROSTAT) 0.4 MG SL tablet Place 1 tablet (0.4 mg total) under the tongue every 5 (five) minutes as needed for chest pain. If chest pain not resolved, after 3 doses 5 minutes apart--call 911  . omeprazole (PRILOSEC) 20 MG capsule Take 20 mg by mouth daily.  . Potassium 99 MG TABS Take 1 tablet by mouth daily.  . sertraline (ZOLOFT) 50 MG tablet Take 1 tablet (50 mg total) by mouth daily.  [name] 740 MG CAPS Take 1 capsule by mouth 3 (three) times daily.  . simvastatin (ZOCOR) 20 MG tablet Take 1 tablet (20 mg total) by mouth at  bedtime.  . traMADol (ULTRAM) 50 MG tablet Take 50 mg by mouth every 6 (six) hours as needed for pain.  . traZODone (DESYREL) 100 MG tablet Take 1 tablet (100 mg total) by mouth at bedtime as needed for sleep.  . [DISCONTINUED] hydrOXYzine (ATARAX/VISTARIL) 50 MG tablet TAKE 2-3 TAB AS NEEDED FOR INSOMNIA  . [DISCONTINUED] sertraline (ZOLOFT) 50 MG tablet Take 1 tablet (50 mg total) by mouth daily.  . [DISCONTINUED] traZODone (DESYREL) 100 MG tablet Take 1 tablet (100 mg total) by mouth at bedtime as needed for sleep.    Past  Psychiatric History/Hospitalization(s): [facility] taken overdose on Ambien. Patient admitted that he has taken overdose on Ambien 2 other times but does not require psychiatric inpatient treatment. He admitted history of bad temper mood swing severe anger rage and poor sleep. He admitted history of manic-like symptoms when he gets very impulsive and getting speeding tickets. However he denies any psychosis or any hallucination. He was never tried any psychotropic medication other than Xanax and Ambien by his primary care physician and recently he was given Zoloft and Vistaril. Patient is seeing therapist Felix Pacini. Anxiety: Yes Bipolar Disorder: No Depression: Yes Mania: Yes Psychosis: No Schizophrenia: No Personality Disorder: No Hospitalization for psychiatric illness: Yes History of Electroconvulsive Shock Therapy: No Prior Suicide Attempts: Yes  Physical Exam: Constitutional:  Wt 328 lb (148.78 kg)  General Appearance: well nourished and obese  Recent Results (from the past 2160 hour(s))  COMPREHENSIVE METABOLIC PANEL     Status: Abnormal   Collection Time    04/30/13  9:46 AM      Result Value Range   Sodium 138  135 - 145 mEq/L   Potassium 4.0  3.5 - 5.3 mEq/L   Chloride 105  96 - 112 mEq/L   CO2 26  19 - 32 mEq/L   Glucose, Bld 115 (*) 70 - 99 mg/dL   BUN 11  6 - 23 mg/dL   Creat 1.19  1.47 - 8.29 mg/dL   Total Bilirubin 0.6  0.3 - 1.2 mg/dL   Alkaline Phosphatase 77  39 - 117 U/L   AST 34  0 - 37 U/L   ALT 32  0 - 53 U/L   Total Protein 6.6  6.0 - 8.3 g/dL   Albumin 4.2  3.5 - 5.2 g/dL   Calcium 9.6  8.4 - 56.2 mg/dL   Musculoskeletal: Strength & Muscle Tone: within normal limits Gait & Station: Difficulty walking due to lack pain Patient leans: Front  Psychiatric: Speech (describe rate, volume, coherence, spontaneity, and abnormalities if any): Fast but clear and coherent  Thought Process (describe rate, content,  abstract reasoning, and computation): Logical and goal-directed  Associations: Relevant and Intact  Thoughts: normal  Mental Status: Orientation: oriented to person, place, time/date, situation and day of week Mood & Affect: depressed affect and anxiety Attention Span & Concentration: Fair  Medical Decision Making (Choose Three): Established Problem, Stable/Improving (1), Review of Psycho-Social Stressors (1), Review of Last Therapy Session (1) and Review of Medication Regimen & Side Effects (2)  Assessment: Axis I: Maj. depressive disorder recurrent, rule out bipolar disorder depressed type  Axis II: Deferred  Axis III: Patient Active Problem List   Diagnosis Date Noted  . Depressive disorder, not elsewhere classified 11/09/2012  . CAD (coronary artery disease) 02/21/2012  . Lipid disorder 02/21/2012  . Angina pectoris 09/27/2011  . OBSTRUCTIVE SLEEP APNEA 05/20/2010  . HYPERTENSION 04/17/2010  .  EMPHYSEMA 04/17/2010  . WEIGHT GAIN, ABNORMAL 04/17/2010    Axis IV: Moderate  Axis V: 60-65   Plan:  I reviewed his blood work and current medication. I wll continue Vistaril 50 mg up to 3 tab at bed time, Zoloft 50 mg daily and trazodone 100 mg at bedtime.  Recommend to call us back if he has any question or any concern.  Followup in 3 months.  ARFEEN,SYED T., MD 06/12/2013

## 2013-06-18 ENCOUNTER — Telehealth: Payer: Self-pay | Admitting: Radiology

## 2013-06-18 DIAGNOSIS — G8921 Chronic pain due to trauma: Secondary | ICD-10-CM

## 2013-06-18 MED ORDER — GABAPENTIN 300 MG PO CAPS: 900.0000 mg | ORAL_CAPSULE | Freq: Three times a day (TID) | ORAL | Status: DC

## 2013-06-18 NOTE — Telephone Encounter (Signed)
Received refill request for Gabapentin, previously prescribed by another provider, are you providing this now?

## 2013-06-18 NOTE — Telephone Encounter (Signed)
OK to rf, however tell him it is best to get this from his specialist

## 2013-06-22 ENCOUNTER — Other Ambulatory Visit: Payer: Self-pay

## 2013-06-22 DIAGNOSIS — G8921 Chronic pain due to trauma: Secondary | ICD-10-CM

## 2013-06-22 MED ORDER — GABAPENTIN 300 MG PO CAPS: 900.0000 mg | ORAL_CAPSULE | Freq: Three times a day (TID) | ORAL | Status: DC

## 2013-06-23 ENCOUNTER — Telehealth: Payer: Self-pay

## 2013-06-23 DIAGNOSIS — G8921 Chronic pain due to trauma: Secondary | ICD-10-CM

## 2013-06-23 NOTE — Telephone Encounter (Signed)
PATIENT CALLING WITH QUESTIONS ABOUT HIS NEUROTIN  RX

## 2013-06-25 ENCOUNTER — Encounter: Payer: Self-pay | Admitting: Physician Assistant

## 2013-06-25 DIAGNOSIS — G8921 Chronic pain due to trauma: Secondary | ICD-10-CM | POA: Insufficient documentation

## 2013-06-25 MED ORDER — GABAPENTIN 300 MG PO CAPS: 900.0000 mg | ORAL_CAPSULE | Freq: Three times a day (TID) | ORAL | Status: DC

## 2013-06-25 NOTE — Telephone Encounter (Signed)
Rx sent.  Meds ordered this encounter  Medications  . gabapentin (NEURONTIN) 300 MG capsule    Sig: Take 3 capsules (900 mg total) by mouth 3 (three) times daily.    Dispense:  180 capsule    Refill:  0    This will compete the Rx sent in for #90 on 12/8, so patient can have a full month supply.    Order Specific Question:  Supervising Provider    Answer:  Patrick North [phone]

## 2013-06-25 NOTE — Telephone Encounter (Signed)
Spoke to patient, he has been taking for years. He indicates the Rx's have all been from here (never a specialist), he did not need refills until Jan. He states 9 pills / day is his dose, but the first Rx was sent (requested from [facility]) for #90/ (he does have appt with Dr Milus Glazier to establish primary care). #90 is a 10 day supply for him, he states he would like the remainder of the Rx for the month sent to [facility], is this okay? I have pended.

## 2013-06-25 NOTE — Addendum Note (Signed)
Addended byCaffie Damme on: 06/25/2013 11:10 AM   Modules accepted: Orders

## 2013-06-25 NOTE — Telephone Encounter (Signed)
Patient also would like to have the quantity for the mail order changed as well, please advise, patient is coming to see Dr Milus Glazier in Feb to have him take over, until then is you.

## 2013-06-25 NOTE — Telephone Encounter (Signed)
Called him. Left message for him to call me back, Dr Perrin Maltese has provided one rx, but wants him to get further refills from the specialist.

## 2013-06-25 NOTE — Telephone Encounter (Signed)
Thanks I have called him to advise.  

## 2013-06-26 ENCOUNTER — Other Ambulatory Visit (HOSPITAL_COMMUNITY): Payer: Self-pay | Admitting: Psychiatry

## 2013-06-26 NOTE — Telephone Encounter (Signed)
Given new script on 06/12/13 with 2 refills.

## 2013-06-30 NOTE — Telephone Encounter (Signed)
Ok to fill as requested.

## 2013-07-02 MED ORDER — GABAPENTIN 300 MG PO CAPS: 900.0000 mg | ORAL_CAPSULE | Freq: Three times a day (TID) | ORAL | Status: DC

## 2013-07-02 NOTE — Addendum Note (Signed)
Addended byCaffie Damme on: 07/02/2013 08:33 AM   Modules accepted: Orders

## 2013-07-02 NOTE — Telephone Encounter (Signed)
Sent in

## 2013-07-03 ENCOUNTER — Other Ambulatory Visit: Payer: Self-pay

## 2013-07-03 DIAGNOSIS — E785 Hyperlipidemia, unspecified: Secondary | ICD-10-CM

## 2013-07-03 MED ORDER — SIMVASTATIN 20 MG PO TABS: 20.0000 mg | ORAL_TABLET | Freq: Every day | ORAL | Status: DC

## 2013-07-03 NOTE — Telephone Encounter (Signed)
Pt reported that he had not requested RFs on this or the gabapentin that was RFd earlier in month. I advised him that we were responding to req from [facility] and also [facility] on gabapentin. Pt stated that the pharmacies are telling him that we are initiating the Rxs and he is not on automatice RF. Pt will call them back and again tell them to take him off of auto RF.

## 2013-07-03 NOTE — Telephone Encounter (Signed)
PT WOULD LIKE A CALL BACK REGARDING HIS MEDICATION THAT HE DIDN'T ASK FOR AND HE ONES HE WANTED, HE DIDN'T GET. PLEASE CALL [phone]

## 2013-07-06 ENCOUNTER — Telehealth: Payer: Self-pay

## 2013-07-06 NOTE — Telephone Encounter (Signed)
Spoke to pt concerning prescriptions, uses a 90 day supply, medication refills are being e-scribed, not faxed as needed. Pt states medications are being sent to other pharmacies. Pt states he only uses an Therapist, occupational. Pt needs all medications filled for 90 day supply.

## 2013-07-06 NOTE — Telephone Encounter (Signed)
PT WOULD LIKE TO SPEAK WITH SOMEONE ABOUT SOME MEDICATION, STATES SOME OF THE MEDICINE HE DOESN'T TAKE ANYMORE. PLEASE CALL [phone]

## 2013-07-09 NOTE — Telephone Encounter (Signed)
This has been done.

## 2013-09-11 ENCOUNTER — Encounter (HOSPITAL_COMMUNITY): Payer: Self-pay | Admitting: Psychiatry

## 2013-09-11 ENCOUNTER — Ambulatory Visit (INDEPENDENT_AMBULATORY_CARE_PROVIDER_SITE_OTHER): Payer: BC Managed Care – PPO | Admitting: Psychiatry

## 2013-09-11 VITALS — BP 144/78 | HR 92 | Ht 74.5 in | Wt 338.0 lb

## 2013-09-11 DIAGNOSIS — F339 Major depressive disorder, recurrent, unspecified: Secondary | ICD-10-CM

## 2013-09-11 DIAGNOSIS — F329 Major depressive disorder, single episode, unspecified: Secondary | ICD-10-CM

## 2013-09-11 MED ORDER — TRAZODONE HCL 100 MG PO TABS: 100.0000 mg | ORAL_TABLET | Freq: Every evening | ORAL | Status: DC | PRN

## 2013-09-11 MED ORDER — HYDROXYZINE HCL 50 MG PO TABS: ORAL_TABLET | ORAL | Status: DC

## 2013-09-11 MED ORDER — SERTRALINE HCL 100 MG PO TABS: 100.0000 mg | ORAL_TABLET | Freq: Every day | ORAL | Status: DC

## 2013-09-11 NOTE — Progress Notes (Signed)
[facility] [address] Progress Note  Matthew Hutchinson [id number] 53 y.o.  09/11/2013 1:56 PM  Chief Complaint:  Anxiety and nervousness.  History of Present Illness: Matthew Hutchinson came for his appointment .  Patient is complaining of increased anxiety and nervousness.  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  Patient admitted sometime irritability and frustration.  He admitted poor sleep and does not believe trazodone is working very well.  He also noticed scratching himself because of anxiety.  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  Patient is also happy because he is getting married next month and planning to [place] in [place].  Patient denies any side effects of medication.  He denies any suicidal thoughts or homicidal thoughts.  He denies any paranoia or any hallucination.  Patient does not drink or use any illegal substances.  He is compliant with the Zoloft, Vistaril and trazodone.  Suicidal Ideation: No Plan Formed: No Patient has means to carry out plan: No  Homicidal Ideation: No Plan Formed: No Patient has means to carry out plan: No  Review of Systems: Psychiatric: Agitation: No Hallucination: No Depressed Mood: Yes Insomnia: No Hypersomnia: No Altered Concentration: No Feels Worthless: No Grandiose Ideas: No Belief In Special Powers: No New/Increased Substance Abuse: No Compulsions: No  Neurologic: Headache: No Seizure: No Paresthesias: No   Medical history.  Patient has hypertension, obesity, angina, coronary artery disease, hyperlipidemia and obstructive sleep apnea. His primary care physician is Dr. [name]. Patient has no history of seizures.   Outpatient Encounter Prescriptions as of 09/11/2013  Medication Sig  . Armodafinil (NUVIGIL) 150 MG tablet Take 150 mg by  mouth daily.  Marland Kitchen aspirin 81 MG tablet Take 1 tablet (81 mg total) by mouth daily.  Marland Kitchen b complex vitamins tablet Take 1 tablet by mouth daily.  . calcium gluconate 500 MG tablet Take 500 mg by mouth daily.  . Cholecalciferol (VITAMIN D) 2000 UNITS tablet Take 1 tablet (2,000 Units total) by mouth daily.  Marland Kitchen DIETARY MANAGE PROD - DIET AID PO Take 2 capsules by mouth daily.  . famotidine (PEPCID) 20 MG tablet Take 20 mg by mouth 2 (two) times daily as needed for heartburn.   . gabapentin (NEURONTIN) 300 MG capsule Take 3 capsules (900 mg total) by mouth 3 (three) times daily.  Marland Kitchen GARCINIA CAMBOGIA-CHROMIUM PO Take 2,000 capsules by mouth 2 (two) times daily with a meal.  . hydrOXYzine (ATARAX/VISTARIL) 50 MG tablet TAKE 2-3 TAB AS NEEDED FOR INSOMNIA  . losartan-hydrochlorothiazide (HYZAAR) 50-12.5 MG per tablet Take 1 tablet by mouth daily.  . Lutein 40 MG CAPS Take 1 capsule by mouth daily.  . magnesium gluconate (MAGONATE) 500 MG tablet Take 500 mg by mouth daily.  . Multiple Vitamin (MULTIVITAMIN) capsule Take 1 capsule by mouth daily.  . nitroGLYCERIN (NITROSTAT) 0.4 MG SL tablet Place 1 tablet (0.4 mg total) under the tongue every 5 (five) minutes as needed for chest pain. If chest pain not resolved, after 3 doses 5 minutes apart--call 911  . omeprazole (PRILOSEC) 20 MG capsule Take 20 mg by mouth daily.  . Potassium 99 MG TABS Take 1 tablet by mouth daily.  . sertraline (ZOLOFT) 100 MG tablet Take 1 tablet (100 mg total) by mouth daily.  [name] 740 MG CAPS  Take 1 capsule by mouth 3 (three) times daily.  . simvastatin (ZOCOR) 20 MG tablet Take 1 tablet (20 mg total) by mouth at bedtime.  . traMADol (ULTRAM) 50 MG tablet Take 50 mg by mouth every 6 (six) hours as needed for pain.  . traZODone (DESYREL) 100 MG tablet Take 1 tablet (100 mg total) by mouth at bedtime as needed for sleep.  . [DISCONTINUED] hydrOXYzine (ATARAX/VISTARIL) 50 MG tablet TAKE 2-3 TAB AS NEEDED FOR INSOMNIA  .  [DISCONTINUED] sertraline (ZOLOFT) 50 MG tablet Take 1 tablet (50 mg total) by mouth daily.  . [DISCONTINUED] traZODone (DESYREL) 100 MG tablet Take 1 tablet (100 mg total) by mouth at bedtime as needed for sleep.    Past Psychiatric History/Hospitalization(s): [facility] taken overdose on Ambien. Patient admitted that he has taken overdose on Ambien 2 other times but does not require psychiatric inpatient treatment. He admitted history of bad temper mood swing severe anger rage and poor sleep. He admitted history of manic-like symptoms when he gets very impulsive and getting speeding tickets. However he denies any psychosis or any hallucination. He was never tried any psychotropic medication other than Xanax and Ambien by his primary care physician and recently he was given Zoloft and Vistaril. Patient is seeing therapist [facility]. Anxiety: Yes Bipolar Disorder: No Depression: Yes Mania: Yes Psychosis: No Schizophrenia: No Personality Disorder: No Hospitalization for psychiatric illness: Yes History of Electroconvulsive Shock Therapy: No Prior Suicide Attempts: Yes  Physical Exam: Constitutional:  BP 144/78  Pulse 92  Ht 6' 2.5" (1.892 m)  Wt 338 lb (153.316 kg)  BMI 42.83 kg/m2  General Appearance: well nourished and obese  Recent Results (from the past 2160 hour(s))  COMPREHENSIVE METABOLIC PANEL     Status: Abnormal   Collection Time    04/30/13  9:46 AM      Result Value Range   Sodium 138  135 - 145 mEq/L   Potassium 4.0  3.5 - 5.3 mEq/L   Chloride 105  96 - 112 mEq/L   CO2 26  19 - 32 mEq/L   Glucose, Bld 115 (*) 70 - 99 mg/dL   BUN 11  6 - 23 mg/dL   Creat 1.611.06  0.960.50 - 0.451.35 mg/dL   Total Bilirubin 0.6  0.3 - 1.2 mg/dL   Alkaline Phosphatase 77  39 - 117 U/L   AST 34  0 - 37 U/L   ALT 32  0 - 53 U/L   Total Protein 6.6  6.0 - 8.3 g/dL   Albumin 4.2  3.5 - 5.2 g/dL   Calcium 9.6  8.4 - 40.910.5 mg/dL    Musculoskeletal: Strength & Muscle Tone: within normal limits Gait & Station: Difficulty walking due to lack pain Patient leans: Front  Psychiatric: Speech (describe rate, volume, coherence, spontaneity, and abnormalities if any): Fast but clear and coherent  Thought Process (describe rate, content, abstract reasoning, and computation): Logical and goal-directed  Associations: Relevant and Intact  Thoughts: normal  Mental Status: Orientation: oriented to person, place, time/date, situation and day of week Mood & Affect: depressed affect and anxiety Attention Span & Concentration: Fair  Established Problem, Stable/Improving (1), Review of Psycho-Social Stressors (1), Established Problem, Worsening (2), Review of Last Therapy Session (1), Review of Medication Regimen & Side Effects (2) and Review of New Medication or Change in Dosage (2)  Assessment: Axis I: Maj. depressive disorder recurrent, rule out bipolar disorder depressed type  Axis II:  Deferred  Axis III: Patient Active Problem List   Diagnosis Date Noted  . Chronic pain due to trauma 06/25/2013  . Depressive disorder, not elsewhere classified 11/09/2012  . CAD (coronary artery disease) 02/21/2012  . Lipid disorder 02/21/2012  . Angina pectoris 09/27/2011  . OBSTRUCTIVE SLEEP APNEA 05/20/2010  . HYPERTENSION 04/17/2010  . EMPHYSEMA 04/17/2010  . WEIGHT GAIN, ABNORMAL 04/17/2010    Axis IV: Moderate  Axis V: 60-65   Plan:  I recommended to increase Zoloft 100 mg daily to help his anxiety and nervousness.  Recommend to continue Vistaril 150 mg at bedtime and trazodone 100 mg at bedtime.  Recommend to call us back if he has any question or any concern.  Followup in 3 months.  Derrick Tiegs T., MD 09/11/2013

## 2013-11-01 ENCOUNTER — Ambulatory Visit: Payer: Self-pay | Admitting: Family Medicine

## 2013-11-15 ENCOUNTER — Ambulatory Visit: Payer: Self-pay | Admitting: Family Medicine

## 2013-11-16 ENCOUNTER — Other Ambulatory Visit (HOSPITAL_COMMUNITY): Payer: Self-pay | Admitting: Psychiatry

## 2013-11-18 ENCOUNTER — Other Ambulatory Visit (HOSPITAL_COMMUNITY): Payer: Self-pay | Admitting: Psychiatry

## 2013-11-21 ENCOUNTER — Other Ambulatory Visit: Payer: Self-pay | Admitting: Internal Medicine

## 2013-11-21 ENCOUNTER — Other Ambulatory Visit (HOSPITAL_COMMUNITY): Payer: Self-pay | Admitting: Psychiatry

## 2013-11-28 ENCOUNTER — Other Ambulatory Visit (HOSPITAL_COMMUNITY): Payer: Self-pay | Admitting: *Deleted

## 2013-11-28 DIAGNOSIS — F329 Major depressive disorder, single episode, unspecified: Secondary | ICD-10-CM

## 2013-11-28 MED ORDER — HYDROXYZINE HCL 50 MG PO TABS: ORAL_TABLET | ORAL | Status: DC

## 2013-11-28 MED ORDER — TRAZODONE HCL 100 MG PO TABS: 100.0000 mg | ORAL_TABLET | Freq: Every evening | ORAL | Status: DC | PRN

## 2013-11-28 MED ORDER — SERTRALINE HCL 100 MG PO TABS: 100.0000 mg | ORAL_TABLET | Freq: Every day | ORAL | Status: DC

## 2013-11-28 NOTE — Telephone Encounter (Signed)
Received request for 90 day supply of Hydroxyzine,Sertraline and Trazodone from [facility]. Authorized by Dr. Lolly MustacheArfeen. All prescriptions originally given 09/11/13 for 30 day supply with 2 refills

## 2013-11-29 ENCOUNTER — Ambulatory Visit (INDEPENDENT_AMBULATORY_CARE_PROVIDER_SITE_OTHER): Payer: BC Managed Care – PPO | Admitting: Family Medicine

## 2013-11-29 ENCOUNTER — Encounter: Payer: Self-pay | Admitting: Family Medicine

## 2013-11-29 VITALS — BP 133/73 | HR 77 | Temp 97.5°F | Resp 16 | Ht 72.5 in | Wt 334.0 lb

## 2013-11-29 DIAGNOSIS — Z0000 Encounter for general adult medical examination without abnormal findings: Secondary | ICD-10-CM

## 2013-11-29 DIAGNOSIS — E785 Hyperlipidemia, unspecified: Secondary | ICD-10-CM

## 2013-11-29 DIAGNOSIS — L280 Lichen simplex chronicus: Secondary | ICD-10-CM

## 2013-11-29 LAB — CBC
HCT: 46.9 % (ref 39.0–52.0)
Hemoglobin: 16.1 g/dL (ref 13.0–17.0)
MCH: 28.8 pg (ref 26.0–34.0)
MCHC: 34.3 g/dL (ref 30.0–36.0)
MCV: 83.8 fL (ref 78.0–100.0)
Platelets: 166 10*3/uL (ref 150–400)
RBC: 5.6 MIL/uL (ref 4.22–5.81)
RDW: 13.9 % (ref 11.5–15.5)
WBC: 3.6 10*3/uL — ABNORMAL LOW (ref 4.0–10.5)

## 2013-11-29 MED ORDER — TRIAMCINOLONE ACETONIDE 0.1 % EX CREA: 1.0000 "application " | TOPICAL_CREAM | Freq: Two times a day (BID) | CUTANEOUS | Status: DC

## 2013-11-29 NOTE — Addendum Note (Signed)
Addended byCaffie Damme: LITTRELL, AMY W on: 11/29/2013 11:45 AM   Modules accepted: Orders

## 2013-11-29 NOTE — Progress Notes (Signed)
Patient ID: Matthew Hutchinson MRN: [id number], DOB: 05/29/1961, 53 y.o. Date of Encounter: 11/29/2013, 10:54 AM  Primary Physician: Tally DueGUEST, CHRIS WARREN, MD  Chief Complaint:  Chief Complaint  Patient presents with   Follow-up    6 month     HPI: 53 y.o. year old male with history below presents for her 6 month follow up.   Today pt states that he is doing okay. He states that he was able to lose weight last year (280 lbs) but the weight just returned back. Pt states during that time he was working a lot and massive dieting. He states that right now he is trying to maintain his work hours about 50 hrs. Pt states that last year he was hospitalized because he was working about 150 hrs.   Pt also has a rash on both of his arms. He states that when he gets anxious or stressed out he develops a "blister like" rash on his arms.   Pt does not have any children  Pt states that at the moment he is unable to take a proper vacations due to the work load associated with his current job. However, he hopes to do so soon once he is able to stabilize the environment.   Colonoscopy: last one was done when he was 53 year old, he states that he was told to get another one at the age of 53.   Pt states that he is planning to schedule an appointment with his orthopaedic for his leg.   Past Medical History  Diagnosis Date   Unspecified essential hypertension    OSA (obstructive sleep apnea)    Morbid obesity    Depression    Anxiety      Home Meds: Prior to Admission medications   Medication Sig Start Date End Date Taking? Authorizing Provider  Armodafinil (NUVIGIL) 150 MG tablet Take 150 mg by mouth daily.   Yes Historical Provider, MD  aspirin 81 MG tablet Take 1 tablet (81 mg total) by mouth daily. 11/13/12  Yes Nanine MeansJamison Lord, NP  b complex vitamins tablet Take 1 tablet by mouth daily.   Yes Historical Provider, MD  calcium gluconate 500 MG tablet Take 500 mg by mouth daily.   Yes Historical  Provider, MD  Cholecalciferol (VITAMIN D) 2000 UNITS tablet Take 1 tablet (2,000 Units total) by mouth daily. 11/13/12  Yes Nanine MeansJamison Lord, NP  DIETARY MANAGE PROD - DIET AID PO Take 2 capsules by mouth daily.   Yes Historical Provider, MD  famotidine (PEPCID) 20 MG tablet Take 20 mg by mouth 2 (two) times daily as needed for heartburn.    Yes Historical Provider, MD  gabapentin (NEURONTIN) 300 MG capsule TAKE 3 BY MOUTH THREE TIMES DAILY   Yes Jonita Albeehris W Guest, MD  GARCINIA CAMBOGIA-CHROMIUM PO Take 2,000 capsules by mouth 2 (two) times daily with a meal.   Yes Historical Provider, MD  hydrOXYzine (ATARAX/VISTARIL) 50 MG tablet TAKE 2-3 TAB AS NEEDED FOR INSOMNIA 11/28/13  Yes Cleotis NipperSyed T Arfeen, MD  losartan-hydrochlorothiazide (HYZAAR) 50-12.5 MG per tablet Take 1 tablet by mouth daily. 05/08/13  Yes Jonita Albeehris W Guest, MD  Lutein 40 MG CAPS Take 1 capsule by mouth daily.   Yes Historical Provider, MD  magnesium gluconate (MAGONATE) 500 MG tablet Take 500 mg by mouth daily.   Yes Historical Provider, MD  Multiple Vitamin (MULTIVITAMIN) capsule Take 1 capsule by mouth daily. 11/13/12  Yes Nanine MeansJamison Lord, NP  nitroGLYCERIN (NITROSTAT) 0.4 MG SL tablet  Place 1 tablet (0.4 mg total) under the tongue every 5 (five) minutes as needed for chest pain. If chest pain not resolved, after 3 doses 5 minutes apart--call 911 11/13/12  Yes Nanine Means, NP  omeprazole (PRILOSEC) 20 MG capsule Take 20 mg by mouth daily.   Yes Historical Provider, MD  Potassium 99 MG TABS Take 1 tablet by mouth daily.   Yes Historical Provider, MD  sertraline (ZOLOFT) 100 MG tablet Take 1 tablet (100 mg total) by mouth daily. 11/28/13  Yes Cleotis Nipper, MD  Shark Cartilage 740 MG CAPS Take 1 capsule by mouth 3 (three) times daily.   Yes Historical Provider, MD  simvastatin (ZOCOR) 20 MG tablet Take 1 tablet (20 mg total) by mouth at bedtime. 07/03/13  Yes Jonita Albee, MD  traMADol (ULTRAM) 50 MG tablet Take 50 mg by mouth every 6 (six) hours as needed  for pain.   Yes Historical Provider, MD  traZODone (DESYREL) 100 MG tablet Take 1 tablet (100 mg total) by mouth at bedtime as needed for sleep. 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    Social History Main Topics   Smoking status: Former Smoker -- 2.00 packs/day for 26 years    Types: Cigarettes    Quit date: 08/13/2003   Smokeless tobacco: Not on file     Comment: 2ppd x 26 years   Alcohol Use: No   Drug Use: No   Sexual Activity: Yes   Other Topics Concern   Not on file   Social History Narrative   No narrative on file     Review of Systems: anxiety Constitutional: negative for chills, fever, night sweats, weight changes, or fatigue  HEENT: negative for vision changes, hearing loss, congestion, rhinorrhea, ST, epistaxis, or sinus pressure Cardiovascular: negative for chest pain or palpitations Respiratory: negative for hemoptysis, wheezing, shortness of breath, or cough Abdominal: negative for abdominal pain, nausea, vomiting, diarrhea, or constipation Dermatological: negative for rash Neurologic: negative for headache, dizziness, or syncope All other systems reviewed and are otherwise negative with the exception to those above and in the HPI.   Physical Exam: Blood pressure 133/73, pulse 77, temperature 97.5 F (36.4 C), resp. rate 16, height 6' 0.5" (1.842 m), weight 334 lb (151.501 kg), SpO2 92.00%., Body mass index is 44.65 kg/(m^2). General: Well developed, well nourished, in no acute distress. Head: Normocephalic, atraumatic, eyes without discharge, sclera non-icteric, nares are without discharge. Bilateral auditory canals clear, TM's are without perforation,  pearly grey and translucent with reflective cone of light bilaterally. Oral cavity moist, posterior pharynx without exudate, erythema, peritonsillar abscess, or post nasal drip.  Neck: Supple. No thyromegaly. Full ROM. No lymphadenopathy. Lungs: Clear bilaterally to auscultation without wheezes, rales, or rhonchi. Breathing is unlabored. Heart: RRR with S1 S2. No murmurs, rubs, or gallops appreciated. Abdomen: Soft, non-tender, non-distended with normoactive bowel sounds. No hepatomegaly. No rebound/guarding. No obvious abdominal masses. Msk:  Strength and tone normal for age. Extremities/Skin: Warm and dry. No clubbing or cyanosis. No edema. No rashes or suspicious lesions.  Patient's arms show multiple excoriations on the dorsal forearm  area consistent with neurodermatitis Neuro: Alert and oriented X 3. Moves all extremities spontaneously. Gait is normal. CNII-XII grossly in tact. Psych:  Responds to questions appropriately with a normal affect.    ASSESSMENT AND PLAN:  53 y.o. year old male with morbid obesity, hypertension, and stress Neurodermatitis - Plan: triamcinolone cream (KENALOG) 0.1 %, CBC  Hyperlipidemia - Plan: CBC, Comprehensive metabolic panel, Lipid panel, CBC, Comprehensive metabolic panel, Lipid panel  Annual physical exam - Plan: PSA    I personally performed the services described in this documentation, which was scribed in my presence. The recorded information has been reviewed and is accurate.  Signed, Elvina SidleKurt Lauenstein, MD 11/29/2013 10:54 AM

## 2013-11-30 LAB — PSA: PSA: 0.35 ng/mL (ref <=4.00)

## 2013-12-04 ENCOUNTER — Telehealth: Payer: Self-pay | Admitting: Family Medicine

## 2013-12-04 LAB — COMPREHENSIVE METABOLIC PANEL
ALT: 23 U/L (ref 0–53)
AST: 25 U/L (ref 0–37)
Albumin: 4.3 g/dL (ref 3.5–5.2)
Alkaline Phosphatase: 85 U/L (ref 39–117)
BUN: 17 mg/dL (ref 6–23)
CO2: 23 mEq/L (ref 19–32)
Calcium: 9 mg/dL (ref 8.4–10.5)
Chloride: 108 mEq/L (ref 96–112)
Creat: 1.08 mg/dL (ref 0.50–1.35)
Glucose, Bld: 128 mg/dL — ABNORMAL HIGH (ref 70–99)
Potassium: 4.1 mEq/L (ref 3.5–5.3)
Sodium: 138 mEq/L (ref 135–145)
Total Bilirubin: 0.6 mg/dL (ref 0.2–1.2)
Total Protein: 6.6 g/dL (ref 6.0–8.3)

## 2013-12-04 LAB — LIPID PANEL
Cholesterol: 130 mg/dL (ref 0–200)
HDL: 41 mg/dL (ref >39)
LDL Cholesterol: 70 mg/dL (ref 0–99)
Total CHOL/HDL Ratio: 3.2 Ratio
Triglycerides: 95 mg/dL (ref <150)
VLDL: 19 mg/dL (ref 0–40)

## 2013-12-04 NOTE — Telephone Encounter (Signed)
i called [facility] to add lipid and cmp back to patients order this was added on at 1:30pm today

## 2013-12-12 ENCOUNTER — Ambulatory Visit (INDEPENDENT_AMBULATORY_CARE_PROVIDER_SITE_OTHER): Payer: BC Managed Care – PPO | Admitting: Psychiatry

## 2013-12-12 ENCOUNTER — Encounter (HOSPITAL_COMMUNITY): Payer: Self-pay | Admitting: Psychiatry

## 2013-12-12 VITALS — BP 120/89 | HR 86 | Ht 74.5 in | Wt 333.0 lb

## 2013-12-12 DIAGNOSIS — F339 Major depressive disorder, recurrent, unspecified: Secondary | ICD-10-CM

## 2013-12-12 DIAGNOSIS — F329 Major depressive disorder, single episode, unspecified: Secondary | ICD-10-CM

## 2013-12-12 NOTE — Progress Notes (Signed)
[facility] [id number] Progress Note  Matthew Hutchinson [id number] 53 y.o.  12/12/2013 2:51 PM  Chief Complaint:  Medication management and followup.    History of Present Illness: Matthew Hutchinson came for his appointment .  He is taking his medication as prescribed.  He is feeling much better since his Zoloft education is increased.  He is taking Vistaril for anxiety.  He denies any major panic attack in recent weeks.  He got married in April and he is very happy with his marriage life.  He denies any irritability, anger, mood swing.  He continues to have issues at work and he is still dealing with acquisition .  One of his employee accuses him for initial harassment.  Patient is not working directly with the employee .  He is handling the situation very well.  He denies any anger or having any racing thoughts.  He is sleeping good.  He wants to continue his current psychotropic medication.  He denies any paranoia or any hallucination.  He is planning to visit [place] in summer . Patient does not drink or use any illegal substances.  He is compliant with the Zoloft, Vistaril and trazodone.  He recently seen his primary care physician and his blood work was done.  Suicidal Ideation: No Plan Formed: No Patient has means to carry out plan: No  Homicidal Ideation: No Plan Formed: No Patient has means to carry out plan: No  Review of Systems: Psychiatric: Agitation: No Hallucination: No Depressed Mood: Yes Insomnia: No Hypersomnia: No Altered Concentration: No Feels Worthless: No Grandiose Ideas: No Belief In Special Powers: No New/Increased Substance Abuse: No Compulsions: No  Neurologic: Headache: No Seizure: No Paresthesias: No   Medical history.  Patient has hypertension, obesity, angina, coronary artery disease, hyperlipidemia and obstructive sleep apnea. His primary care physician is Dr. [name]. Patient has no history of seizures.   Outpatient Encounter Prescriptions as  of 12/12/2013  Medication Sig  . Armodafinil (NUVIGIL) 150 MG tablet Take 150 mg by mouth daily.  Marland Kitchen aspirin 81 MG tablet Take 1 tablet (81 mg total) by mouth daily.  Marland Kitchen b complex vitamins tablet Take 1 tablet by mouth daily.  . calcium gluconate 500 MG tablet Take 500 mg by mouth daily.  . Cholecalciferol (VITAMIN D) 2000 UNITS tablet Take 1 tablet (2,000 Units total) by mouth daily.  Marland Kitchen DIETARY MANAGE PROD - DIET AID PO Take 2 capsules by mouth daily.  . famotidine (PEPCID) 20 MG tablet Take 20 mg by mouth 2 (two) times daily as needed for heartburn.   . gabapentin (NEURONTIN) 300 MG capsule TAKE 3 BY MOUTH THREE TIMES DAILY  . GARCINIA CAMBOGIA-CHROMIUM PO Take 2,000 capsules by mouth 2 (two) times daily with a meal.  . hydrOXYzine (ATARAX/VISTARIL) 50 MG tablet TAKE 2-3 TAB AS NEEDED FOR INSOMNIA  . losartan-hydrochlorothiazide (HYZAAR) 50-12.5 MG per tablet Take 1 tablet by mouth daily.  . Lutein 40 MG CAPS Take 1 capsule by mouth daily.  . magnesium gluconate (MAGONATE) 500 MG tablet Take 500 mg by mouth daily.  . Multiple Vitamin (MULTIVITAMIN) capsule Take 1 capsule by mouth daily.  . nitroGLYCERIN (NITROSTAT) 0.4 MG SL tablet Place 1 tablet (0.4 mg total) under the tongue every 5 (five) minutes as needed for chest pain. If chest pain not resolved, after 3 doses 5 minutes apart--call 911  . omeprazole (PRILOSEC) 20 MG capsule Take 20 mg by mouth daily.  . Potassium 99 MG TABS Take 1 tablet by  mouth daily.  . sertraline (ZOLOFT) 100 MG tablet Take 1 tablet (100 mg total) by mouth daily.  [name]. Shark Cartilage 740 MG CAPS Take 1 capsule by mouth 3 (three) times daily.  . simvastatin (ZOCOR) 20 MG tablet Take 1 tablet (20 mg total) by mouth at bedtime.  . traMADol (ULTRAM) 50 MG tablet Take 50 mg by mouth every 6 (six) hours as needed for pain.  . traZODone (DESYREL) 100 MG tablet Take 1 tablet (100 mg total) by mouth at bedtime as needed for sleep.  Marland Kitchen. triamcinolone cream (KENALOG) 0.1 % Apply 1  application topically 2 (two) times daily.    Past Psychiatric History/Hospitalization(s): [facility] taken overdose on Ambien. Patient admitted that he has taken overdose on Ambien 2 other times but does not require psychiatric inpatient treatment. He admitted history of bad temper mood swing severe anger rage and poor sleep. He admitted history of manic-like symptoms when he gets very impulsive and getting speeding tickets. However he denies any psychosis or any hallucination. He was never tried any psychotropic medication other than Xanax and Ambien by his primary care physician and recently he was given Zoloft and Vistaril. Patient is seeing therapist Felix PaciniChris East. Anxiety: Yes Bipolar Disorder: No Depression: Yes Mania: Yes Psychosis: No Schizophrenia: No Personality Disorder: No Hospitalization for psychiatric illness: Yes History of Electroconvulsive Shock Therapy: No Prior Suicide Attempts: Yes  Physical Exam: Constitutional:  BP 120/89  Pulse 86  Ht 6' 2.5" (1.892 m)  Wt 333 lb (151.048 kg)  BMI 42.20 kg/m2  General Appearance: well nourished and obese  Recent Results (from the past 2160 hour(s))  CBC     Status: Abnormal   Collection Time    11/29/13 11:28 AM      Result Value Ref Range   WBC 3.6 (*) 4.0 - 10.5 K/uL   RBC 5.60  4.22 - 5.81 MIL/uL   Hemoglobin 16.1  13.0 - 17.0 g/dL   HCT 16.146.9  09.639.0 - 04.552.0 %   MCV 83.8  78.0 - 100.0 fL   MCH 28.8  26.0 - 34.0 pg   MCHC 34.3  30.0 - 36.0 g/dL   RDW 40.913.9  81.111.5 - 91.415.5 %   Platelets 166  150 - 400 K/uL  COMPREHENSIVE METABOLIC PANEL     Status: Abnormal   Collection Time    11/29/13 11:28 AM      Result Value Ref Range   Sodium 138  135 - 145 mEq/L   Potassium 4.1  3.5 - 5.3 mEq/L   Chloride 108  96 - 112 mEq/L   CO2 23  19 - 32 mEq/L   Glucose, Bld 128 (*) 70 - 99 mg/dL   BUN 17  6 - 23 mg/dL   Creat 7.821.08  9.560.50 - 2.131.35 mg/dL   Total Bilirubin 0.6  0.2 - 1.2 mg/dL    Alkaline Phosphatase 85  39 - 117 U/L   AST 25  0 - 37 U/L   ALT 23  0 - 53 U/L   Total Protein 6.6  6.0 - 8.3 g/dL   Albumin 4.3  3.5 - 5.2 g/dL   Calcium 9.0  8.4 - 08.610.5 mg/dL  LIPID PANEL     Status: None   Collection Time    11/29/13 11:28 AM      Result Value Ref Range   Cholesterol 130  0 - 200 mg/dL   Comment: ATP III Classification:           <  200        mg/dL        Desirable          200 - 239     mg/dL        Borderline High          >= 240        mg/dL        High         Triglycerides 95  <150 mg/dL   HDL 41  >66 mg/dL   Total CHOL/HDL Ratio 3.2     VLDL 19  0 - 40 mg/dL   LDL Cholesterol 70  0 - 99 mg/dL   Comment:       Total Cholesterol/HDL Ratio:CHD Risk                            Coronary Heart Disease Risk Table                                            Men       Women              1/2 Average Risk              3.4        3.3                  Average Risk              5.0        4.4               2X Average Risk              9.6        7.1               3X Average Risk             23.4       11.0     Use the calculated Patient Ratio above and the CHD Risk table      to determine the patient's CHD Risk.     ATP III Classification (LDL):           < 100        mg/dL         Optimal          100 - 129     mg/dL         Near or Above Optimal          130 - 159     mg/dL         Borderline High          160 - 189     mg/dL         High           > 190        mg/dL         Very High        PSA     Status: None   Collection Time    11/29/13 11:44 AM      Result Value Ref Range   PSA 0.35  <=4.00 ng/mL   Comment: Test Methodology: ECLIA PSA (Electrochemiluminescence Immunoassay)  For PSA values from 2.5-4.0, particularly in younger men <60 years     old, the AUA and NCCN suggest testing for % Free PSA (3515) and     evaluation of the rate of increase in PSA (PSA velocity).   Musculoskeletal: Strength & Muscle Tone: within normal limits Gait &  Station: Difficulty walking due to lack pain Patient leans: Front  Psychiatric: Speech (describe rate, volume, coherence, spontaneity, and abnormalities if any): Fast but clear and coherent  Thought Process (describe rate, content, abstract reasoning, and computation): Logical and goal-directed  Associations: Relevant and Intact  Thoughts: normal  Mental Status: Orientation: oriented to person, place, time/date, situation and day of week Mood & Affect: depressed affect and anxiety Attention Span & Concentration: Fair  Established Problem, Stable/Improving (1), Review of Psycho-Social Stressors (1), Review or order clinical lab tests (1), Review of Last Therapy Session (1) and Review of Medication Regimen & Side Effects (2)  Assessment: Axis I: Maj. depressive disorder recurrent, rule out bipolar disorder depressed type  Axis II: Deferred  Axis III: Patient Active Problem List   Diagnosis Date Noted  . Chronic pain due to trauma 06/25/2013  . Depressive disorder, not elsewhere classified 11/09/2012  . CAD (coronary artery disease) 02/21/2012  . Lipid disorder 02/21/2012  . Angina pectoris 09/27/2011  . OBSTRUCTIVE SLEEP APNEA 05/20/2010  . HYPERTENSION 04/17/2010  . EMPHYSEMA 04/17/2010  . WEIGHT GAIN, ABNORMAL 04/17/2010    Axis IV: Moderate  Axis V: 60-65   Plan:  Patient is doing better on his current psychotropic medication.  I will continue Zoloft 100 mg daily, Vistaril 150 mg at bedtime and trazodone 100 mg at bedtime.  Recommend to call us back if he has any question or any concern.  Followup in 3 months.  ARFEEN,SYED T., MD 12/12/2013

## 2014-01-04 ENCOUNTER — Ambulatory Visit (INDEPENDENT_AMBULATORY_CARE_PROVIDER_SITE_OTHER): Payer: BC Managed Care – PPO | Admitting: Pulmonary Disease

## 2014-01-04 ENCOUNTER — Encounter: Payer: Self-pay | Admitting: Pulmonary Disease

## 2014-01-04 VITALS — BP 140/78 | HR 77 | Temp 98.3°F | Ht 74.0 in | Wt 337.4 lb

## 2014-01-04 DIAGNOSIS — G4733 Obstructive sleep apnea (adult) (pediatric): Secondary | ICD-10-CM

## 2014-01-04 NOTE — Patient Instructions (Signed)
Will see if we can change your home care company  Will refer to sleep center for a mask fitting session Work on weight loss followup with me again in one year.

## 2014-01-04 NOTE — Assessment & Plan Note (Signed)
The patient continues to be compliant with CPAP and is satisfied with results, but is having issues with mask fit. I will refer him to the sleep Center for a formal mask fitting, and also see if some of the other home care company's may have his old CPAP mask. I've also encouraged him to work aggressively on weight loss. I will see him back in one year.

## 2014-01-04 NOTE — Progress Notes (Signed)
   Subjective:    Patient ID: Matthew Hutchinson, male    DOB: 01/05/1961, 53 y.o.   MRN: [id number]  HPI Patient comes in today for followup of his obstructive sleep apnea. He is wearing CPAP compliantly, but continues to have issues with mask fit. He has not been able to find a source for his old nasal pillows, and is holding his very old mask together with staples and straining. He feels that he sleeps well with the device, and is satisfied with his daytime alertness. Of note, his weight is up 25 pounds over the last one year.   Review of Systems  Constitutional: Negative for fever and unexpected weight change.  HENT: Negative for congestion, dental problem, ear pain, nosebleeds, postnasal drip, rhinorrhea, sinus pressure, sneezing, sore throat and trouble swallowing.   Eyes: Negative for redness and itching.  Respiratory: Negative for cough, chest tightness, shortness of breath and wheezing.   Cardiovascular: Negative for palpitations and leg swelling.  Gastrointestinal: Negative for nausea and vomiting.  Genitourinary: Negative for dysuria.  Musculoskeletal: Negative for joint swelling.  Skin: Negative for rash.  Neurological: Negative for headaches.  Hematological: Does not bruise/bleed easily.  Psychiatric/Behavioral: Negative for dysphoric mood. The patient is not nervous/anxious.        Objective:   Physical Exam Obese male in no acute distress Nose without purulence or discharge noted No skin breakdown or pressure necrosis from the CPAP mask Neck without lymphadenopathy or thyromegaly Lower extremities without edema, no cyanosis Alert and oriented, moves all 4 extremities.       Assessment & Plan:

## 2014-01-23 ENCOUNTER — Other Ambulatory Visit (HOSPITAL_BASED_OUTPATIENT_CLINIC_OR_DEPARTMENT_OTHER): Payer: Self-pay

## 2014-02-19 ENCOUNTER — Other Ambulatory Visit: Payer: Self-pay | Admitting: Internal Medicine

## 2014-02-28 ENCOUNTER — Other Ambulatory Visit (HOSPITAL_COMMUNITY): Payer: Self-pay | Admitting: Psychiatry

## 2014-03-03 ENCOUNTER — Other Ambulatory Visit (HOSPITAL_COMMUNITY): Payer: Self-pay | Admitting: Psychiatry

## 2014-03-14 ENCOUNTER — Encounter (HOSPITAL_COMMUNITY): Payer: Self-pay | Admitting: Psychiatry

## 2014-03-14 ENCOUNTER — Ambulatory Visit (INDEPENDENT_AMBULATORY_CARE_PROVIDER_SITE_OTHER): Payer: BC Managed Care – PPO | Admitting: Psychiatry

## 2014-03-14 VITALS — BP 133/90 | HR 86 | Ht 74.0 in | Wt 354.0 lb

## 2014-03-14 DIAGNOSIS — F339 Major depressive disorder, recurrent, unspecified: Secondary | ICD-10-CM

## 2014-03-14 DIAGNOSIS — F330 Major depressive disorder, recurrent, mild: Secondary | ICD-10-CM

## 2014-03-14 NOTE — Progress Notes (Signed)
[facility] [address] Progress Note  Matthew Hutchinson [id number] 53 y.o.  03/14/2014 2:46 PM  Chief Complaint:  Medication management and followup.    History of Present Illness: Matthew Hutchinson came for his appointment .  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  Recently they received a letter from state because his partner has not filed taxes for a long time.  Patient admitted that he was very upset and irritable however he was able to handle to anger better and he was able to address this issue with the partner .  Patient told he celebrated 6 months into his venting and had a good party and he enjoyed the time.  Overall he is feeling better on his current medication.  He denied any side effects.  He is sleeping okay .  He denies any crying spells however he continues to have moments of irritability , frustration and anger because of situation around him.  He denies any hallucination or any paranoia.  He is taking his medication and denies any side effects.  He is taking Vistaril, Zoloft and trazodone as prescribed.    Suicidal Ideation: No Plan Formed: No Patient has means to carry out plan: No  Homicidal Ideation: No Plan Formed: No Patient has means to carry out plan: No  Review of Systems: Psychiatric: Agitation: No Hallucination: No Depressed Mood: No Insomnia: No Hypersomnia: No Altered Concentration: No Feels Worthless: No Grandiose Ideas: No Belief In Special Powers: No New/Increased Substance Abuse: No Compulsions: No  Neurologic: Headache: No Seizure: No Paresthesias: No   Medical history.  Patient has hypertension, obesity, angina, coronary artery disease, hyperlipidemia and obstructive sleep apnea. His primary care physician is Dr. [name]. Patient has no history of seizures.   Outpatient Encounter Prescriptions as of 03/14/2014  Medication Sig  . Armodafinil (NUVIGIL) 150 MG tablet Take 150 mg by mouth  daily.  Marland Kitchen aspirin 81 MG tablet Take 1 tablet (81 mg total) by mouth daily.  Marland Kitchen b complex vitamins tablet Take 1 tablet by mouth daily.  . calcium gluconate 500 MG tablet Take 500 mg by mouth daily.  . Cholecalciferol (VITAMIN D) 2000 UNITS tablet Take 1 tablet (2,000 Units total) by mouth daily.  . famotidine (PEPCID) 20 MG tablet Take 20 mg by mouth 2 (two) times daily as needed for heartburn.   . gabapentin (NEURONTIN) 300 MG capsule TAKE 3 BY MOUTH THREE TIMES DAILY  . hydrOXYzine (ATARAX/VISTARIL) 50 MG tablet TAKE 2 TO 3 BY MOUTH AS NEEDED FOR INSOMNIA  . losartan-hydrochlorothiazide (HYZAAR) 50-12.5 MG per tablet TAKE 1 BY MOUTH DAILY  . Lutein 40 MG CAPS Take 1 capsule by mouth daily.  . magnesium gluconate (MAGONATE) 500 MG tablet Take 500 mg by mouth daily.  . Multiple Vitamin (MULTIVITAMIN) capsule Take 1 capsule by mouth daily.  . nitroGLYCERIN (NITROSTAT) 0.4 MG SL tablet Place 1 tablet (0.4 mg total) under the tongue every 5 (five) minutes as needed for chest pain. If chest pain not resolved, after 3 doses 5 minutes apart--call 911  . omeprazole (PRILOSEC) 20 MG capsule Take 20 mg by mouth daily.  . Potassium 99 MG TABS Take 1 tablet by mouth daily.  . sertraline (ZOLOFT) 100 MG tablet TAKE 1 BY MOUTH DAILY  . Shark Cartilage 740 MG CAPS Take 1 capsule by mouth 3 (three) times daily.  . simvastatin (ZOCOR) 20 MG tablet TAKE 1 BY MOUTH AT BEDTIME  . traMADol (  ULTRAM) 50 MG tablet Take 50 mg by mouth every 6 (six) hours as needed for pain.  . traZODone (DESYREL) 100 MG tablet TAKE 1 BY MOUTH AT BEDTIME AS NEEDED FOR SLEEP  . triamcinolone cream (KENALOG) 0.1 % Apply 1 application topically 2 (two) times daily.    Past Psychiatric History/Hospitalization(s): [facility] after taken overdose on Ambien. Patient admitted that he has taken overdose on Ambien 2 other times but does not require psychiatric inpatient treatment. He admitted history of bad  temper mood swing severe anger rage and poor sleep. He admitted history of manic-like symptoms when he gets very impulsive and getting speeding tickets. However he denies any psychosis or any hallucination. He was never tried any psychotropic medication other than Xanax and Ambien by his primary care physician and recently he was given Zoloft and Vistaril. Patient is seeing therapist Felix Pacini. Anxiety: Yes Bipolar Disorder: No Depression: Yes Mania: Yes Psychosis: No Schizophrenia: No Personality Disorder: No Hospitalization for psychiatric illness: Yes History of Electroconvulsive Shock Therapy: No Prior Suicide Attempts: Yes  Physical Exam: Constitutional:  BP 133/90  Pulse 86  Ht  (1.88 m)  Wt 354 lb (160.573 kg)  BMI 45.43 kg/m2  General Appearance: well nourished and obese  No results found for this or any previous visit (from the past 2160 hour(s)). Musculoskeletal: Strength & Muscle Tone: within normal limits Gait & Station: Difficulty walking due to lack pain Patient leans: Front  Psychiatric: Speech (describe rate, volume, coherence, spontaneity, and abnormalities if any): Fast but clear and coherent  Thought Process (describe rate, content, abstract reasoning, and computation): Logical and goal-directed  Associations: Relevant and Intact  Thoughts: normal  Mental Status: Orientation: oriented to person, place, time/date, situation and day of week Mood & Affect: depressed affect and anxiety Attention Span & Concentration: Fair  Established Problem, Stable/Improving (1), Review of Psycho-Social Stressors (1), Review of Last Therapy Session (1) and Review of Medication Regimen & Side Effects (2)  Assessment: Axis I: Maj. depressive disorder recurrent, rule out bipolar disorder depressed type  Axis II: Deferred  Axis III: Patient Active Problem List   Diagnosis Date Noted  . Chronic pain due to trauma 06/25/2013  . Depressive disorder, not elsewhere  classified 11/09/2012  . CAD (coronary artery disease) 02/21/2012  . Lipid disorder 02/21/2012  . Angina pectoris 09/27/2011  . OBSTRUCTIVE SLEEP APNEA 05/20/2010  . HYPERTENSION 04/17/2010  . EMPHYSEMA 04/17/2010  . WEIGHT GAIN, ABNORMAL 04/17/2010    Axis IV: Moderate  Axis V: 60-65   Plan:  Patient is stable on his current medication.  He does not have any side effects.  I will continue Zoloft 100 mg daily, Vistaril 150 mg at bedtime and trazodone 100 mg at bedtime.  Recommend to call us back if he has any question or any concern.  Followup in 3 months.  Dartanion Teo T., MD 03/14/2014

## 2014-04-23 ENCOUNTER — Other Ambulatory Visit: Payer: Self-pay | Admitting: Internal Medicine

## 2014-04-23 ENCOUNTER — Other Ambulatory Visit: Payer: Self-pay | Admitting: Family Medicine

## 2014-05-23 ENCOUNTER — Encounter: Payer: Self-pay | Admitting: Family Medicine

## 2014-05-23 ENCOUNTER — Ambulatory Visit (INDEPENDENT_AMBULATORY_CARE_PROVIDER_SITE_OTHER): Payer: BC Managed Care – PPO | Admitting: Family Medicine

## 2014-05-23 VITALS — BP 135/81 | HR 73 | Temp 97.7°F | Resp 16 | Ht 72.5 in | Wt 345.0 lb

## 2014-05-23 DIAGNOSIS — E785 Hyperlipidemia, unspecified: Secondary | ICD-10-CM

## 2014-05-23 DIAGNOSIS — Z23 Encounter for immunization: Secondary | ICD-10-CM

## 2014-05-23 DIAGNOSIS — Z1211 Encounter for screening for malignant neoplasm of colon: Secondary | ICD-10-CM

## 2014-05-23 DIAGNOSIS — I25810 Atherosclerosis of coronary artery bypass graft(s) without angina pectoris: Secondary | ICD-10-CM

## 2014-05-23 DIAGNOSIS — I10 Essential (primary) hypertension: Secondary | ICD-10-CM

## 2014-05-23 LAB — COMPLETE METABOLIC PANEL WITH GFR
ALT: 24 U/L (ref 0–53)
AST: 27 U/L (ref 0–37)
Albumin: 4.1 g/dL (ref 3.5–5.2)
Alkaline Phosphatase: 81 U/L (ref 39–117)
BUN: 13 mg/dL (ref 6–23)
CO2: 25 mEq/L (ref 19–32)
Calcium: 9.2 mg/dL (ref 8.4–10.5)
Chloride: 105 mEq/L (ref 96–112)
Creat: 1 mg/dL (ref 0.50–1.35)
GFR, Est African American: >89 mL/min
GFR, Est Non African American: 86 mL/min
Glucose, Bld: 129 mg/dL — ABNORMAL HIGH (ref 70–99)
Potassium: 4.3 mEq/L (ref 3.5–5.3)
Sodium: 137 mEq/L (ref 135–145)
Total Bilirubin: 0.6 mg/dL (ref 0.2–1.2)
Total Protein: 6.7 g/dL (ref 6.0–8.3)

## 2014-05-23 LAB — POCT URINALYSIS DIPSTICK
Bilirubin, UA: NEGATIVE
Blood, UA: NEGATIVE
Glucose, UA: NEGATIVE
Ketones, UA: NEGATIVE
Leukocytes, UA: NEGATIVE
Nitrite, UA: NEGATIVE
Protein, UA: NEGATIVE
Spec Grav, UA: 1.02
Urobilinogen, UA: 0.2
pH, UA: 5

## 2014-05-23 LAB — CBC WITH DIFFERENTIAL/PLATELET
Basophils Absolute: 0 10*3/uL (ref 0.0–0.1)
Basophils Relative: 1 % (ref 0–1)
Eosinophils Absolute: 0.2 10*3/uL (ref 0.0–0.7)
Eosinophils Relative: 5 % (ref 0–5)
HCT: 46.3 % (ref 39.0–52.0)
Hemoglobin: 16.2 g/dL (ref 13.0–17.0)
Lymphocytes Relative: 29 % (ref 12–46)
Lymphs Abs: 1.2 10*3/uL (ref 0.7–4.0)
MCH: 29.5 pg (ref 26.0–34.0)
MCHC: 35 g/dL (ref 30.0–36.0)
MCV: 84.2 fL (ref 78.0–100.0)
Monocytes Absolute: 0.4 10*3/uL (ref 0.1–1.0)
Monocytes Relative: 9 % (ref 3–12)
Neutro Abs: 2.2 10*3/uL (ref 1.7–7.7)
Neutrophils Relative %: 56 % (ref 43–77)
Platelets: 162 10*3/uL (ref 150–400)
RBC: 5.5 MIL/uL (ref 4.22–5.81)
RDW: 14.4 % (ref 11.5–15.5)
WBC: 4 10*3/uL (ref 4.0–10.5)

## 2014-05-23 LAB — LIPID PANEL
Cholesterol: 139 mg/dL (ref 0–200)
HDL: 50 mg/dL (ref >39)
LDL Cholesterol: 72 mg/dL (ref 0–99)
Total CHOL/HDL Ratio: 2.8 Ratio
Triglycerides: 84 mg/dL (ref <150)
VLDL: 17 mg/dL (ref 0–40)

## 2014-05-23 LAB — IFOBT (OCCULT BLOOD): IFOBT: NEGATIVE

## 2014-05-23 LAB — TSH: TSH: 0.697 u[IU]/mL (ref 0.350–4.500)

## 2014-05-23 NOTE — Progress Notes (Signed)
   Subjective:    Patient ID: Matthew Hutchinson, male    DOB: 05/13/1961, 53 y.o.   MRN: [id number]  HPI    Review of Systems  HENT: Positive for tinnitus.   Respiratory: Positive for shortness of breath.   Musculoskeletal: Positive for arthralgias and gait problem.  Psychiatric/Behavioral: Positive for agitation. The patient is nervous/anxious.        Objective:   Physical Exam        Assessment & Plan:

## 2014-05-23 NOTE — Patient Instructions (Signed)
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
## 2014-06-13 ENCOUNTER — Ambulatory Visit (INDEPENDENT_AMBULATORY_CARE_PROVIDER_SITE_OTHER): Payer: BLUE CROSS/BLUE SHIELD | Admitting: Psychiatry

## 2014-06-13 ENCOUNTER — Encounter (HOSPITAL_COMMUNITY): Payer: Self-pay | Admitting: Psychiatry

## 2014-06-13 VITALS — BP 142/88 | HR 90 | Ht 74.5 in | Wt 349.4 lb

## 2014-06-13 DIAGNOSIS — F339 Major depressive disorder, recurrent, unspecified: Secondary | ICD-10-CM

## 2014-06-13 DIAGNOSIS — F330 Major depressive disorder, recurrent, mild: Secondary | ICD-10-CM

## 2014-06-13 MED ORDER — HYDROXYZINE HCL 50 MG PO TABS: 100.0000 mg | ORAL_TABLET | Freq: Every day | ORAL | Status: DC

## 2014-06-13 MED ORDER — TRAZODONE HCL 100 MG PO TABS: 100.0000 mg | ORAL_TABLET | Freq: Every day | ORAL | Status: DC

## 2014-06-13 MED ORDER — SERTRALINE HCL 100 MG PO TABS: 100.0000 mg | ORAL_TABLET | Freq: Every day | ORAL | Status: DC

## 2014-06-13 NOTE — Progress Notes (Signed)
[place] [phone] Progress Note  Matthew Hutchinson [id number] 53 y.o.  06/13/2014 2:31 PM  Chief Complaint:  Medication management and followup.    History of Present Illness: Matthew Hutchinson came for his appointment .  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  However he is happy because his partner is moving reasonable and things are going very well.  Patient has a plan to spend Christmas in [place] which is his home town.  Patient is taking his medication and denies any side effects.  He usually takes Vistaril 100 mg at bedtime.  He is sleeping better with Vistaril and trazodone.  He likes Zoloft and there has been no new issues or side effects.  He understands that he has chronic stress from the work but he is trying to handle this better.  Patient denies any agitation, anger, severe mood swings.  His appetite is okay.  His vitals are stable.  He denies drinking or using any illegal substances.  He wants to continue Vistaril, Zoloft and trazodone.  Suicidal Ideation: No Plan Formed: No Patient has means to carry out plan: No  Homicidal Ideation: No Plan Formed: No Patient has means to carry out plan: No  Review of Systems: Psychiatric: Agitation: No Hallucination: No Depressed Mood: No Insomnia: No Hypersomnia: No Altered Concentration: No Feels Worthless: No Grandiose Ideas: No Belief In Special Powers: No New/Increased Substance Abuse: No Compulsions: No  Neurologic: Headache: No Seizure: No Paresthesias: No   Medical history.  Patient has hypertension, obesity, angina, coronary artery disease, hyperlipidemia and obstructive sleep apnea. His primary care physician is Dr. Birdena Jubilee. Patient has no history of seizures.   Outpatient Encounter Prescriptions as of 06/13/2014  Medication Sig  . Armodafinil (NUVIGIL) 150 MG tablet Take 150 mg by mouth daily.  Marland Kitchen aspirin 81 MG  tablet Take 1 tablet (81 mg total) by mouth daily.  Marland Kitchen b complex vitamins tablet Take 1 tablet by mouth daily.  . calcium gluconate 500 MG tablet Take 500 mg by mouth daily.  . Cholecalciferol (VITAMIN D) 2000 UNITS tablet Take 1 tablet (2,000 Units total) by mouth daily.  . famotidine (PEPCID) 20 MG tablet Take 20 mg by mouth 2 (two) times daily as needed for heartburn.   . gabapentin (NEURONTIN) 300 MG capsule TAKE 3 BY MOUTH THREE TIMES DAILY  . hydrOXYzine (ATARAX/VISTARIL) 50 MG tablet Take 2 tablets (100 mg total) by mouth at bedtime.  Marland Kitchen losartan-hydrochlorothiazide (HYZAAR) 50-12.5 MG per tablet TAKE 1 BY MOUTH DAILY  . Lutein 40 MG CAPS Take 1 capsule by mouth daily.  . magnesium gluconate (MAGONATE) 500 MG tablet Take 500 mg by mouth daily.  . Multiple Vitamin (MULTIVITAMIN) capsule Take 1 capsule by mouth daily.  . nitroGLYCERIN (NITROSTAT) 0.4 MG SL tablet Place 1 tablet (0.4 mg total) under the tongue every 5 (five) minutes as needed for chest pain. If chest pain not resolved, after 3 doses 5 minutes apart--call 911  . omeprazole (PRILOSEC) 20 MG capsule Take 20 mg by mouth daily.  . Potassium 99 MG TABS Take 1 tablet by mouth daily.  . sertraline (ZOLOFT) 100 MG tablet Take 1 tablet (100 mg total) by mouth daily.  Berniece Pap Cartilage 740 MG CAPS Take 1 capsule by mouth 3 (three) times daily.  . simvastatin (ZOCOR) 20 MG tablet TAKE 1 BY MOUTH AT BEDTIME  . traMADol (ULTRAM) 50 MG tablet Take  50 mg by mouth every 6 (six) hours as needed for pain.  . traZODone (DESYREL) 100 MG tablet Take 1 tablet (100 mg total) by mouth at bedtime.  . triamcinolone cream (KENALOG) 0.1 % Apply 1 application topically 2 (two) times daily.  . [DISCONTINUED] hydrOXYzine (ATARAX/VISTARIL) 50 MG tablet TAKE 2 TO 3 BY MOUTH AS NEEDED FOR INSOMNIA  . [DISCONTINUED] sertraline (ZOLOFT) 100 MG tablet TAKE 1 BY MOUTH DAILY  . [DISCONTINUED] traZODone (DESYREL) 100 MG tablet TAKE 1 BY MOUTH AT BEDTIME AS NEEDED  FOR SLEEP    Past Psychiatric History/Hospitalization(s): [facility] after taken overdose on Ambien.  He has history of taking overdose on Ambien 2 other times but does not require psychiatric inpatient treatment. He admitted history of bad temper mood swing severe anger rage and poor sleep. He admitted history of manic-like symptoms when he gets very impulsive and getting speeding tickets. However he denies any psychosis or any hallucination. He was never tried any psychotropic medication other than Xanax and Ambien by his primary care physician and recently he was given Zoloft and Vistaril. Patient is seeing therapist Felix Pacini. Anxiety: Yes Bipolar Disorder: No Depression: Yes Mania: Yes Psychosis: No Schizophrenia: No Personality Disorder: No Hospitalization for psychiatric illness: Yes History of Electroconvulsive Shock Therapy: No Prior Suicide Attempts: Yes  Physical Exam: Constitutional:  BP 142/88 mmHg  Pulse 90  Ht 6' 2.5" (1.892 m)  Wt 349 lb 6.4 oz (158.487 kg)  BMI 44.27 kg/m2  General Appearance: well nourished and obese  Recent Results (from the past 2160 hour(s))  TSH     Status: None   Collection Time: 05/23/14  9:58 AM  Result Value Ref Range   TSH 0.697 0.350 - 4.500 uIU/mL  CBC with Differential     Status: None   Collection Time: 05/23/14  9:58 AM  Result Value Ref Range   WBC 4.0 4.0 - 10.5 K/uL   RBC 5.50 4.22 - 5.81 MIL/uL   Hemoglobin 16.2 13.0 - 17.0 g/dL   HCT 01.7 24.1 - 95.4 %   MCV 84.2 78.0 - 100.0 fL   MCH 29.5 26.0 - 34.0 pg   MCHC 35.0 30.0 - 36.0 g/dL   RDW 24.8 14.4 - 39.2 %   Platelets 162 150 - 400 K/uL   Neutrophils Relative % 56 43 - 77 %   Neutro Abs 2.2 1.7 - 7.7 K/uL   Lymphocytes Relative 29 12 - 46 %   Lymphs Abs 1.2 0.7 - 4.0 K/uL   Monocytes Relative 9 3 - 12 %   Monocytes Absolute 0.4 0.1 - 1.0 K/uL   Eosinophils Relative 5 0 - 5 %   Eosinophils Absolute 0.2 0.0 - 0.7 K/uL    Basophils Relative 1 0 - 1 %   Basophils Absolute 0.0 0.0 - 0.1 K/uL   Smear Review Criteria for review not met   COMPLETE METABOLIC PANEL WITH GFR     Status: Abnormal   Collection Time: 05/23/14  9:58 AM  Result Value Ref Range   Sodium 137 135 - 145 mEq/L   Potassium 4.3 3.5 - 5.3 mEq/L   Chloride 105 96 - 112 mEq/L   CO2 25 19 - 32 mEq/L   Glucose, Bld 129 (H) 70 - 99 mg/dL   BUN 13 6 - 23 mg/dL   Creat 6.59 9.78 - 7.76 mg/dL   Total Bilirubin 0.6 0.2 - 1.2 mg/dL   Alkaline Phosphatase 81 39 - 117  U/L   AST 27 0 - 37 U/L   ALT 24 0 - 53 U/L   Total Protein 6.7 6.0 - 8.3 g/dL   Albumin 4.1 3.5 - 5.2 g/dL   Calcium 9.2 8.4 - 10.5 mg/dL   GFR, Est African American >89 mL/min   GFR, Est Non African American 86 mL/min    Comment:   The estimated GFR is a calculation valid for adults (>=82 years old) that uses the CKD-EPI algorithm to adjust for age and sex. It is   not to be used for children, pregnant women, hospitalized patients,    patients on dialysis, or with rapidly changing kidney function. According to the NKDEP, eGFR >89 is normal, 60-89 shows mild impairment, 30-59 shows moderate impairment, 15-29 shows severe impairment and <15 is ESRD.     Lipid panel     Status: None   Collection Time: 05/23/14  9:58 AM  Result Value Ref Range   Cholesterol 139 0 - 200 mg/dL    Comment: ATP III Classification:       < 200        mg/dL        Desirable      200 - 239     mg/dL        Borderline High      >= 240        mg/dL        High      Triglycerides 84 <150 mg/dL   HDL 50 >39 mg/dL   Total CHOL/HDL Ratio 2.8 Ratio   VLDL 17 0 - 40 mg/dL   LDL Cholesterol 72 0 - 99 mg/dL    Comment:   Total Cholesterol/HDL Ratio:CHD Risk                        Coronary Heart Disease Risk Table                                        Men       Women          1/2 Average Risk              3.4        3.3              Average Risk              5.0        4.4           2X Average Risk               9.6        7.1           3X Average Risk             23.4       11.0 Use the calculated Patient Ratio above and the CHD Risk table  to determine the patient's CHD Risk. ATP III Classification (LDL):       < 100        mg/dL         Optimal      100 - 129     mg/dL         Near or Above Optimal      130 - 159     mg/dL  Borderline High      160 - 189     mg/dL         High       > 190        mg/dL         Very High     POCT urinalysis dipstick     Status: None   Collection Time: 05/23/14 10:18 AM  Result Value Ref Range   Color, UA yellow    Clarity, UA clear    Glucose, UA neg    Bilirubin, UA neg    Ketones, UA neg    Spec Grav, UA 1.020    Blood, UA neg    pH, UA 5.0    Protein, UA neg    Urobilinogen, UA 0.2    Nitrite, UA neg    Leukocytes, UA Negative   IFOBT POC (occult bld, rslt in office)     Status: None   Collection Time: 05/23/14 10:18 AM  Result Value Ref Range   IFOBT Negative    Musculoskeletal: Strength & Muscle Tone: within normal limits Gait & Station: Difficulty walking due to lack pain Patient leans: Front  Psychiatric: Speech (describe rate, volume, coherence, spontaneity, and abnormalities if any): Fast with increased tone but clear and coherent  Thought Process (describe rate, content, abstract reasoning, and computation): Logical and goal-directed  Associations: Relevant and Intact  Thoughts: normal  Mental Status: Orientation: oriented to person, place, time/date, situation and day of week Mood & Affect: anxiety Attention Span & Concentration: Fair  Established Problem, Stable/Improving (1), Review of Psycho-Social Stressors (1), Review of Last Therapy Session (1) and Review of Medication Regimen & Side Effects (2)  Assessment: Axis I: Maj. depressive disorder recurrent, rule out bipolar disorder depressed type  Axis II: Deferred  Axis III: Patient Active Problem List   Diagnosis Date Noted  . Chronic pain due to  trauma 06/25/2013  . Depressive disorder, not elsewhere classified 11/09/2012  . CAD (coronary artery disease) 02/21/2012  . Lipid disorder 02/21/2012  . Angina pectoris 09/27/2011  . OBSTRUCTIVE SLEEP APNEA 05/20/2010  . HYPERTENSION 04/17/2010  . EMPHYSEMA 04/17/2010  . WEIGHT GAIN, ABNORMAL 04/17/2010    Axis IV: Moderate  Axis V: 60-65   Plan:  Patient is stable on his current medication.  Despite given Vistaril 150 mg he is only taking 100 mg at bedtime.  He likes Vistaril and trazodone combination.  Recommended to continue Zoloft 100 mg daily.  A new prescription of Vistaril 50 mg 2 tablet at bedtime is given for 90 days along with Zoloft and trazodone prescription.  Recommended to call us back if he has any question, concern or if he feel worsening of the symptoms.  I will see him again in 3 months.  Esmeralda Blanford T., MD 06/13/2014

## 2014-06-14 ENCOUNTER — Telehealth (HOSPITAL_COMMUNITY): Payer: Self-pay

## 2014-06-16 ENCOUNTER — Other Ambulatory Visit (HOSPITAL_COMMUNITY): Payer: Self-pay | Admitting: Psychiatry

## 2014-06-17 ENCOUNTER — Other Ambulatory Visit (HOSPITAL_COMMUNITY): Payer: Self-pay | Admitting: Psychiatry

## 2014-06-20 ENCOUNTER — Encounter: Payer: Self-pay | Admitting: Family Medicine

## 2014-09-12 ENCOUNTER — Ambulatory Visit (INDEPENDENT_AMBULATORY_CARE_PROVIDER_SITE_OTHER): Payer: BLUE CROSS/BLUE SHIELD | Admitting: Psychiatry

## 2014-09-12 ENCOUNTER — Encounter (HOSPITAL_COMMUNITY): Payer: Self-pay | Admitting: Psychiatry

## 2014-09-12 VITALS — BP 141/84 | HR 78 | Ht 74.5 in | Wt 349.0 lb

## 2014-09-12 DIAGNOSIS — F330 Major depressive disorder, recurrent, mild: Secondary | ICD-10-CM

## 2014-09-12 MED ORDER — HYDROXYZINE PAMOATE 100 MG PO CAPS: 100.0000 mg | ORAL_CAPSULE | Freq: Every day | ORAL | Status: DC

## 2014-09-12 MED ORDER — TRAZODONE HCL 100 MG PO TABS: 100.0000 mg | ORAL_TABLET | Freq: Every day | ORAL | Status: DC

## 2014-09-12 MED ORDER — SERTRALINE HCL 100 MG PO TABS: 100.0000 mg | ORAL_TABLET | Freq: Every day | ORAL | Status: DC

## 2014-09-12 NOTE — Progress Notes (Signed)
[facility] [id number] Progress Note  Matthew Hutchinson [id number] 54 y.o.  09/12/2014 2:08 PM  Chief Complaint:  Medication management and followup.    History of Present Illness: Matthew Hutchinson came for his appointment .  He is compliant with his medication and denies any major anxiety or panic attack.  He is taking Vistaril 100 mg at bedtime.  He has some struggle changing his name but he is relieved that finally he was able to get his name changed.  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  Patient told usually they have to get extension but he is happy that things are going on time.  He sleeping good.  He rarely requires third 50 mg Vistaril.  Patient denies any crying spells, irritability, anger or any mood swing.  He denies any tremors or shakes.  He wants to continue Vistaril 100 mg along with Zoloft and trazodone.  Patient denies drinking or using any illegal substances.  His appetite is okay.  His vitals are stable.  Suicidal Ideation: No Plan Formed: No Patient has means to carry out plan: No  Homicidal Ideation: No Plan Formed: No Patient has means to carry out plan: No  Review of Systems: Psychiatric: Agitation: No Hallucination: No Depressed Mood: No Insomnia: No Hypersomnia: No Altered Concentration: No Feels Worthless: No Grandiose Ideas: No Belief In Special Powers: No New/Increased Substance Abuse: No Compulsions: No  Neurologic: Headache: No Seizure: No Paresthesias: No   Medical history.  Patient has hypertension, obesity, angina, coronary artery disease, hyperlipidemia and obstructive sleep apnea. His primary care physician is Dr. Jamas LavGuess. Patient has no history of seizures.   Outpatient Encounter Prescriptions as of 09/12/2014  Medication Sig  . Armodafinil (NUVIGIL) 150 MG tablet Take 150 mg by mouth daily.  Marland Kitchen. aspirin 81 MG tablet Take 1 tablet (81 mg  total) by mouth daily.  Marland Kitchen. b complex vitamins tablet Take 1 tablet by mouth daily.  . calcium gluconate 500 MG tablet Take 500 mg by mouth daily.  . Cholecalciferol (VITAMIN D) 2000 UNITS tablet Take 1 tablet (2,000 Units total) by mouth daily.  . famotidine (PEPCID) 20 MG tablet Take 20 mg by mouth 2 (two) times daily as needed for heartburn.   . gabapentin (NEURONTIN) 300 MG capsule TAKE 3 BY MOUTH THREE TIMES DAILY  . losartan-hydrochlorothiazide (HYZAAR) 50-12.5 MG per tablet TAKE 1 BY MOUTH DAILY  . Lutein 40 MG CAPS Take 1 capsule by mouth daily.  . magnesium gluconate (MAGONATE) 500 MG tablet Take 500 mg by mouth daily.  . Multiple Vitamin (MULTIVITAMIN) capsule Take 1 capsule by mouth daily.  . nitroGLYCERIN (NITROSTAT) 0.4 MG SL tablet Place 1 tablet (0.4 mg total) under the tongue every 5 (five) minutes as needed for chest pain. If chest pain not resolved, after 3 doses 5 minutes apart--call 911  . omeprazole (PRILOSEC) 20 MG capsule Take 20 mg by mouth daily.  . Potassium 99 MG TABS Take 1 tablet by mouth daily.  . sertraline (ZOLOFT) 100 MG tablet Take 1 tablet (100 mg total) by mouth daily.  [name]. Shark Cartilage 740 MG CAPS Take 1 capsule by mouth 3 (three) times daily.  . simvastatin (ZOCOR) 20 MG tablet TAKE 1 BY MOUTH AT BEDTIME  . traMADol (ULTRAM) 50 MG tablet Take 50 mg by mouth every 6 (six) hours as needed for pain.  . traZODone (DESYREL)  100 MG tablet Take 1 tablet (100 mg total) by mouth at bedtime.  . triamcinolone cream (KENALOG) 0.1 % Apply 1 application topically 2 (two) times daily.  . [DISCONTINUED] hydrOXYzine (ATARAX/VISTARIL) 50 MG tablet Take 2 tablets (100 mg total) by mouth at bedtime.  . [DISCONTINUED] sertraline (ZOLOFT) 100 MG tablet Take 1 tablet (100 mg total) by mouth daily.  . [DISCONTINUED] traZODone (DESYREL) 100 MG tablet Take 1 tablet (100 mg total) by mouth at bedtime.  . hydrOXYzine (VISTARIL) 100 MG capsule Take 1 capsule (100 mg total) by mouth at  bedtime.    Past Psychiatric History/Hospitalization(s): [facility] taken overdose on Ambien.  He has history of taking overdose on Ambien 2 other times but does not require psychiatric inpatient treatment. He admitted history of bad temper mood swing severe anger rage and poor sleep.  He endorse history of manic-like symptoms when he gets very impulsive and aggressive.  He has history of getting speeding tickets.  He denies any psychosis or any hallucination. He was never tried any psychotropic medication other than Xanax and Ambien by his primary care physician.  Anxiety: Yes Bipolar Disorder: No Depression: Yes Mania: Yes Psychosis: No Schizophrenia: No Personality Disorder: No Hospitalization for psychiatric illness: Yes History of Electroconvulsive Shock Therapy: No Prior Suicide Attempts: Yes  Physical Exam: Constitutional:  BP 141/84 mmHg  Pulse 78  Ht 6' 2.5" (1.892 m)  Wt 349 lb (158.305 kg)  BMI 44.22 kg/m2  General Appearance: well nourished and obese  No results found for this or any previous visit (from the past 2160 hour(s)). Musculoskeletal: Strength & Muscle Tone: within normal limits Gait & Station: Difficulty walking due to lack pain Patient leans: Front  Psychiatric: Speech (describe rate, volume, coherence, spontaneity, and abnormalities if any): Fast with increased tone but clear and coherent  Thought Process (describe rate, content, abstract reasoning, and computation): Logical and goal-directed  Associations: Relevant and Intact  Thoughts: normal  Mental Status: Orientation: oriented to person, place, time/date, situation and day of week Mood & Affect: anxiety Attention Span & Concentration: Fair  Established Problem, Stable/Improving (1), Review of Psycho-Social Stressors (1), Review of Last Therapy Session (1) and Review of Medication Regimen & Side Effects (2)  Assessment: Axis I: Maj. depressive  disorder recurrent, rule out bipolar disorder depressed type  Axis II: Deferred  Axis III: Patient Active Problem List   Diagnosis Date Noted  . Chronic pain due to trauma 06/25/2013  . Depressive disorder, not elsewhere classified 11/09/2012  . CAD (coronary artery disease) 02/21/2012  . Lipid disorder 02/21/2012  . Angina pectoris 09/27/2011  . OBSTRUCTIVE SLEEP APNEA 05/20/2010  . HYPERTENSION 04/17/2010  . EMPHYSEMA 04/17/2010  . WEIGHT GAIN, ABNORMAL 04/17/2010   Plan:  Patient is stable on his current medication.  We will cut down his Vistaril from 150 to 100 mg as he is taking only 100 mg.  Continue Zoloft 100 mg daily and trazodone 100 mg at bedtime.  Patient requires refill for 90 days.  Discussed medication side effects and benefits.  Recommended to call us back if he has any question, concern or if he feel worsening of the symptoms.  I will see him again in 3 months.  Maneh Sieben T., MD 09/12/2014

## 2014-09-13 ENCOUNTER — Other Ambulatory Visit (HOSPITAL_COMMUNITY): Payer: Self-pay | Admitting: Psychiatry

## 2014-09-13 NOTE — Telephone Encounter (Signed)
Received via computer RX refill request for patient's medication.  Patient was seen in office yesterday(09-12-14) and all medication was refilled that is listed.  I spoke with Lurena Joinerebecca at [facility] and the RX request came after the RX renewl was sent, missed paths.  Disregard RX request. Patient will receive medications in one week.

## 2014-09-16 ENCOUNTER — Telehealth (HOSPITAL_COMMUNITY): Payer: Self-pay

## 2014-09-16 NOTE — Telephone Encounter (Signed)
Telephone call with patient to follow up on a message left requesting Dr. Lolly MustacheArfeen change patient's Hydroxyzine medication order back to 50 mg 2-3 per day as needed due to the 100mg  tablet was going to cost $88 per month ($264 per 90 days) compared to $4 ($12 per 90 days) of the 50 mg dosage.  Patient admits he typically only takes 2 of the 50 mg per day so is okay if only authorizes 2 but reports cannot afford the 100 mg tablets. Patient reported he is trying to get the 90 days supply of 100 mg tablets returned to his mail order pharmacy but would like the 50mg  order e-scribed in as soon as possible.

## 2014-09-17 ENCOUNTER — Other Ambulatory Visit: Payer: Self-pay

## 2014-09-17 MED ORDER — GABAPENTIN 300 MG PO CAPS: ORAL_CAPSULE | ORAL | Status: DC

## 2014-09-18 ENCOUNTER — Other Ambulatory Visit (HOSPITAL_COMMUNITY): Payer: Self-pay | Admitting: Psychiatry

## 2014-09-18 MED ORDER — HYDROXYZINE HCL 50 MG PO TABS: ORAL_TABLET | ORAL | Status: DC

## 2014-09-18 NOTE — Progress Notes (Signed)
Discontinue Vistaril capsule and restart Vistaril tablet due to cost.

## 2014-09-19 NOTE — Telephone Encounter (Signed)
[facility] called and left message on voice mail. Wanted to verify that our office went back to Hydroxyzine 50 mg and does not want the Hydroxyzine 100 mg anymore.  Called back at [phone] and spoke with Victorino DikeJennifer advised we want Hydroxyzine 50 mg.  Medication will be sent to patient.

## 2014-09-24 ENCOUNTER — Other Ambulatory Visit: Payer: Self-pay | Admitting: Family Medicine

## 2014-11-21 ENCOUNTER — Ambulatory Visit: Payer: Self-pay | Admitting: Family Medicine

## 2014-12-16 ENCOUNTER — Encounter: Payer: Self-pay | Admitting: Pulmonary Disease

## 2014-12-16 ENCOUNTER — Ambulatory Visit (HOSPITAL_COMMUNITY): Payer: Self-pay | Admitting: Psychiatry

## 2014-12-16 ENCOUNTER — Ambulatory Visit (INDEPENDENT_AMBULATORY_CARE_PROVIDER_SITE_OTHER): Payer: BLUE CROSS/BLUE SHIELD | Admitting: Pulmonary Disease

## 2014-12-16 VITALS — BP 138/78 | HR 70 | Temp 96.8°F | Ht 73.0 in | Wt 352.0 lb

## 2014-12-16 DIAGNOSIS — G4733 Obstructive sleep apnea (adult) (pediatric): Secondary | ICD-10-CM

## 2014-12-16 NOTE — Progress Notes (Signed)
   Subjective:    Patient ID: Matthew Hutchinson, male    DOB: 09/18/1960, 54 y.o.   MRN: [id number]  HPI Patient comes in today for follow-up of his obstructive sleep apnea. He is wearing C Pap compliantly, and has found a mask that is comfortable for him.  He is satisfied with his sleep at night, and feels that his daytime alertness is adequate. It, he has gained 15 pounds since the last visit.   Review of Systems  Constitutional: Negative for fever and unexpected weight change.  HENT: Negative for congestion, dental problem, ear pain, nosebleeds, postnasal drip, rhinorrhea, sinus pressure, sneezing, sore throat and trouble swallowing.   Eyes: Negative for redness and itching.  Respiratory: Negative for cough, chest tightness, shortness of breath and wheezing.   Cardiovascular: Negative for palpitations and leg swelling.  Gastrointestinal: Negative for nausea and vomiting.  Genitourinary: Negative for dysuria.  Musculoskeletal: Negative for joint swelling.  Skin: Negative for rash.  Neurological: Negative for headaches.  Hematological: Does not bruise/bleed easily.  Psychiatric/Behavioral: Negative for dysphoric mood. The patient is not nervous/anxious.        Objective:   Physical Exam Morbidly obese male in no acute distress Nose without purulence or discharge noted Neck without lymphadenopathy or thyromegaly No skin breakdown or pressure necrosis from the C Pap mask Lower extremities with minimal edema, no cyanosis Alert and oriented, moves all 4 extremities       Assessment & Plan:

## 2014-12-16 NOTE — Patient Instructions (Signed)
Continue on cpap, and keep up with mask changes and supplies. Keep working on weight loss followup with Dr. Craige CottaSood in one year, but call if having issues.

## 2014-12-16 NOTE — Assessment & Plan Note (Signed)
The patient continues to do very well with his C Pap device, and has found a mask that is comfortable for him. I have asked him to continue on his C Pap, and to work aggressively on weight loss.

## 2014-12-17 ENCOUNTER — Other Ambulatory Visit (HOSPITAL_COMMUNITY): Payer: Self-pay | Admitting: Psychiatry

## 2014-12-18 ENCOUNTER — Encounter (HOSPITAL_COMMUNITY): Payer: Self-pay | Admitting: Psychiatry

## 2014-12-18 ENCOUNTER — Ambulatory Visit (INDEPENDENT_AMBULATORY_CARE_PROVIDER_SITE_OTHER): Payer: BLUE CROSS/BLUE SHIELD | Admitting: Psychiatry

## 2014-12-18 VITALS — BP 138/86 | HR 82 | Ht 74.5 in | Wt 346.6 lb

## 2014-12-18 DIAGNOSIS — F330 Major depressive disorder, recurrent, mild: Secondary | ICD-10-CM | POA: Diagnosis not present

## 2014-12-18 MED ORDER — HYDROXYZINE HCL 50 MG PO TABS: ORAL_TABLET | ORAL | Status: DC

## 2014-12-18 MED ORDER — TRAZODONE HCL 100 MG PO TABS: 100.0000 mg | ORAL_TABLET | Freq: Every day | ORAL | Status: DC

## 2014-12-18 MED ORDER — SERTRALINE HCL 100 MG PO TABS: 100.0000 mg | ORAL_TABLET | Freq: Every day | ORAL | Status: DC

## 2014-12-18 NOTE — Progress Notes (Signed)
[facility] [id number] Progress Note  [name] [id number] 54 y.o.  12/18/2014 3:20 PM  Chief Complaint:  Medication management and follow-up.  History of Present Illness:  Matthew Hutchinson came for his follow-up appointment.  He mentioned for past few months he has been more stressful because of his partner.  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  Since he has done 2 weeks ago things are getting better.  He likes his medication.  He sleeping good.  He admitted some time irritability and anger when he has to make decision about the business.  Otherwise he is feeling good about his medication.  He denies any insomnia, crying spells, or any anger issues.  His appetite is okay.  His vitals are stable.  Lately he is complaining of leg pain due to arthritis and sometime he has difficulty walking.  Patient denies drinking or using any illegal substances.  He wants to continue Vistaril 100 mg at bedtime and Zoloft 100 mg daily.  Recently he seen his pulmonologist and there were no changes in his medication.  He is concerned his physicians are getting retired and now he has to find a new pulmonologist.  Suicidal Ideation: No Plan Formed: No Patient has means to carry out plan: No  Homicidal Ideation: No Plan Formed: No Patient has means to carry out plan: No  Medical History; Patient has hypertension, obesity, angina, coronary artery disease, hyperlipidemia and obstructive sleep apnea. His primary care physician is Dr. Jamas LavGuess. Patient has no history of seizures.   Past Psychiatric History/Hospitalization(s) [facility] taken overdose on Ambien. He has history of taking overdose on Ambien 2 other times but does not require psychiatric inpatient treatment. He admitted history of bad temper mood swing severe anger rage and poor sleep. He endorse history of manic-like symptoms when he gets very impulsive and aggressive. He has history of  getting speeding tickets. He denies any psychosis or any hallucination. He was never tried any psychotropic medication other than Xanax and Ambien by his primary care physician Anxiety: Yes Bipolar Disorder: No Depression: No Mania: Yes Psychosis: No Schizophrenia: No Personality Disorder: No Hospitalization for psychiatric illness: Yes History of Electroconvulsive Shock Therapy: No Prior Suicide Attempts: Yes   Review of Systems: Psychiatric: Agitation: No Hallucination: No Depressed Mood: No Insomnia: No Hypersomnia: No Altered Concentration: No Feels Worthless: No Grandiose Ideas: No Belief In Special Powers: No New/Increased Substance Abuse: No Compulsions: No  Neurologic: Headache: No Seizure: No Paresthesias: No  Outpatient Encounter Prescriptions as of 12/18/2014  Medication Sig  . Armodafinil (NUVIGIL) 150 MG tablet Take 150 mg by mouth daily.  Marland Kitchen. aspirin 81 MG tablet Take 1 tablet (81 mg total) by mouth daily.  Marland Kitchen. b complex vitamins tablet Take 1 tablet by mouth daily.  . calcium gluconate 500 MG tablet Take 500 mg by mouth daily.  . Cholecalciferol (VITAMIN D) 2000 UNITS tablet Take 1 tablet (2,000 Units total) by mouth daily.  . famotidine (PEPCID) 20 MG tablet Take 20 mg by mouth 2 (two) times daily as needed for heartburn.   . gabapentin (NEURONTIN) 300 MG capsule TAKE 3 BY MOUTH THREE TIMES DAILY  . hydrOXYzine (ATARAX/VISTARIL) 50 MG tablet Take 2 tab at bed time  . losartan-hydrochlorothiazide (HYZAAR) 50-12.5 MG per tablet Take 1 tablet by mouth daily.  . Lutein 40 MG CAPS Take 1 capsule by mouth daily.  . magnesium gluconate (MAGONATE) 500 MG tablet Take 500 mg by mouth  daily.  . Multiple Vitamin (MULTIVITAMIN) capsule Take 1 capsule by mouth daily.  . nitroGLYCERIN (NITROSTAT) 0.4 MG SL tablet Place 1 tablet (0.4 mg total) under the tongue every 5 (five) minutes as needed for chest pain. If chest pain not resolved, after 3 doses 5 minutes apart--call 911   . omeprazole (PRILOSEC) 20 MG capsule Take 20 mg by mouth daily.  . Potassium 99 MG TABS Take 1 tablet by mouth daily.  . sertraline (ZOLOFT) 100 MG tablet Take 1 tablet (100 mg total) by mouth daily.  [name] 740 MG CAPS Take 1 capsule by mouth 3 (three) times daily.  . simvastatin (ZOCOR) 20 MG tablet TAKE 1 BY MOUTH AT BEDTIME  . traMADol (ULTRAM) 50 MG tablet Take 50 mg by mouth every 6 (six) hours as needed for pain.  . traZODone (DESYREL) 100 MG tablet Take 1 tablet (100 mg total) by mouth at bedtime.  . triamcinolone cream (KENALOG) 0.1 % Apply 1 application topically 2 (two) times daily.  . [DISCONTINUED] hydrOXYzine (ATARAX/VISTARIL) 50 MG tablet Take 2 tab at bed time  . [DISCONTINUED] sertraline (ZOLOFT) 100 MG tablet Take 1 tablet (100 mg total) by mouth daily.  . [DISCONTINUED] traZODone (DESYREL) 100 MG tablet Take 1 tablet (100 mg total) by mouth at bedtime.   No facility-administered encounter medications on file as of 12/18/2014.    No results found for this or any previous visit (from the past 2160 hour(s)).  Physical Exam: Consitutional ;  BP 138/86 mmHg  Pulse 82  Ht 6' 2.5" (1.892 m)  Wt 346 lb 9.6 oz (157.217 kg)  BMI 43.92 kg/m2  Musculoskeletal: Strength & Muscle Tone: within normal limits Gait & Station: Difficulty due to pain in his leg Patient leans: Front   Psychiatric Specialty Exam: General Appearance: Well nourished and obese  Eye Contact::  Fair  Speech:  Hospital with increased tone  Volume:  Increased  Mood:  Anxious  Affect:  Congruent  Thought Process:  Goal Directed  Orientation:  Full (Time, Place, and Person)  Thought Content:  WDL  Suicidal Thoughts:  No  Homicidal Thoughts:  No  Memory:  Immediate;   Good Recent;   Good Remote;   Good  Judgement:  Good  Insight:  Good  Psychomotor Activity:  EPS  Concentration:  Fair  Recall:  Fair  Fund of Knowledge:  Good  Language:  Good  Akathisia:  No  Handed:  Right  AIMS  (if indicated):     Assets:  Communication Skills Desire for Improvement Financial Resources/Insurance Housing Physical Health Social Support  ADL's:  Intact  Cognition:  WNL  Sleep:        Established Problem, Stable/Improving (1), Review of Last Therapy Session (1) and Review of Medication Regimen & Side Effects (2)  Assessment: Major depressive disorder, recurrent  Axis III:  Past Medical History  Diagnosis Date  . Unspecified essential hypertension   . OSA (obstructive sleep apnea)   . Morbid obesity   . Depression   . Anxiety     Plan:  Patient is doing better on Vistaril and Zoloft.  He does not have any side effects.  Discussed medication in detail.  Recommended to continue his current medication.  A new prescription of Vistaril 50 mg 2 tablet at bedtime, trazodone 100 mg at bedtime and Zoloft 100 mg daily is given.  Recommended to call us back if he has any question or any concern.  Follow-up in 3 months.  ARFEEN,SYED  T., MD 12/18/2014

## 2014-12-18 NOTE — Telephone Encounter (Signed)
New orders e-scribed during patient's evaluation this date.

## 2015-01-08 ENCOUNTER — Ambulatory Visit: Payer: Self-pay | Admitting: Pulmonary Disease

## 2015-01-29 ENCOUNTER — Other Ambulatory Visit: Payer: Self-pay | Admitting: Family Medicine

## 2015-01-31 ENCOUNTER — Encounter: Payer: Self-pay | Admitting: Cardiology

## 2015-01-31 ENCOUNTER — Encounter: Payer: Self-pay | Admitting: Cardiovascular Disease

## 2015-02-03 ENCOUNTER — Encounter: Payer: Self-pay | Admitting: Cardiovascular Disease

## 2015-02-07 ENCOUNTER — Encounter: Payer: Self-pay | Admitting: Cardiology

## 2015-02-10 ENCOUNTER — Encounter: Payer: Self-pay | Admitting: Family Medicine

## 2015-02-10 ENCOUNTER — Ambulatory Visit (INDEPENDENT_AMBULATORY_CARE_PROVIDER_SITE_OTHER): Payer: BLUE CROSS/BLUE SHIELD | Admitting: Family Medicine

## 2015-02-10 VITALS — BP 147/83 | HR 76 | Temp 98.3°F | Resp 16 | Ht 73.0 in | Wt 350.0 lb

## 2015-02-10 DIAGNOSIS — M79641 Pain in right hand: Secondary | ICD-10-CM | POA: Diagnosis not present

## 2015-02-10 DIAGNOSIS — I10 Essential (primary) hypertension: Secondary | ICD-10-CM

## 2015-02-10 DIAGNOSIS — I25119 Atherosclerotic heart disease of native coronary artery with unspecified angina pectoris: Secondary | ICD-10-CM

## 2015-02-10 DIAGNOSIS — E785 Hyperlipidemia, unspecified: Secondary | ICD-10-CM

## 2015-02-10 DIAGNOSIS — R739 Hyperglycemia, unspecified: Secondary | ICD-10-CM

## 2015-02-10 DIAGNOSIS — Z79899 Other long term (current) drug therapy: Secondary | ICD-10-CM | POA: Diagnosis not present

## 2015-02-10 LAB — COMPLETE METABOLIC PANEL WITH GFR
ALBUMIN: 4.2 g/dL (ref 3.6–5.1)
ALT: 29 U/L (ref 9–46)
AST: 28 U/L (ref 10–35)
Alkaline Phosphatase: 82 U/L (ref 40–115)
BUN: 12 mg/dL (ref 7–25)
CHLORIDE: 103 mmol/L (ref 98–110)
CO2: 26 mmol/L (ref 20–31)
Calcium: 9.9 mg/dL (ref 8.6–10.3)
Creat: 1.08 mg/dL (ref 0.70–1.33)
GFR, Est African American: >89 mL/min (ref >=60)
GFR, Est Non African American: 78 mL/min (ref >=60)
Glucose, Bld: 128 mg/dL — ABNORMAL HIGH (ref 65–99)
Potassium: 4.4 mmol/L (ref 3.5–5.3)
SODIUM: 139 mmol/L (ref 135–146)
TOTAL PROTEIN: 6.7 g/dL (ref 6.1–8.1)
Total Bilirubin: 0.5 mg/dL (ref 0.2–1.2)

## 2015-02-10 LAB — LIPID PANEL
Cholesterol: 147 mg/dL (ref 125–200)
HDL: 49 mg/dL (ref >=40)
LDL CALC: 67 mg/dL (ref <130)
TRIGLYCERIDES: 155 mg/dL — AB (ref <150)
Total CHOL/HDL Ratio: 3 Ratio (ref <=5.0)
VLDL: 31 mg/dL — AB (ref <30)

## 2015-02-10 LAB — HEMOGLOBIN A1C
HEMOGLOBIN A1C: 6.5 % — AB (ref <5.7)
Mean Plasma Glucose: 140 mg/dL — ABNORMAL HIGH (ref <117)

## 2015-02-10 NOTE — Progress Notes (Signed)
Subjective:  This chart was scribed for Matthew Staggers, MD by [name], medical scribe at [facility].The patient was seen in exam room 29 and the patient's care was started at 5:31 PM.   Patient ID: Matthew Hutchinson, male    DOB: Feb 17, 1961, 54 y.o.   MRN: [id number] Chief Complaint  Patient presents with  . Follow-up    6 month follow up  . Hypertension  . Hyperlipidemia  . Coronary Artery Disease  . Cyst    cyst on the outer right hand   HPI HPI Comments: Matthew Hutchinson is a 54 y.o. male with a history of hypertension, hyperlipidemia, CAD, emphysema, OSA on CPAP and depression who presents to [facility] here for a six month follow up and multiple other concerns. He is a new patient to me transitioning from Dr. Perrin Maltese and Dr. Milus Glazier. Last visit here was with Dr. Milus Glazier in November 2015. He did have blood work drawn this morning.  Hypertension: He takes Hyzaar-HCTZ 50-12.5 mg once daily.  He does not check his blood pressure today. Lab Results  Component Value Date   CREATININE 1.00 05/23/2014   BP Readings from Last 3 Encounters:  02/10/15 147/83  12/18/14 138/86  12/16/14 138/78   Hyperlipidemia: Taking Zocor 20 mg daily. Lab Results  Component Value Date   CHOL 139 05/23/2014   HDL 50 05/23/2014   LDLCALC 72 05/23/2014   TRIG 84 05/23/2014   CHOLHDL 2.8 05/23/2014   Hyperglycemia: Blood glucose of 129 at last visit in November 2015.  CAD: Followed by Dr. Allyson Sabal at [facility]. Last seen by Dr. Allyson Sabal two years ago. No changes in the angina recently. Cath several years ago, some blockage but nothing was done. Has nitroglycerin for the angina and has used this once ever.  Emphysema, Obstructive sleep apnea: Followed by Dr. Shelle Iron, at last visit on June 6th, it appears just CPAP was discussed at that visit.  Depression: Followed by Dr. Lolly Mustache at [facility]  Cyst on right hand: On the  top of his right wrist present for 30 years. That is more sore just recently and hurts when he bangs it against something. He would like a referral to Dr. Amanda Pea at [facility].   Chronic Pain: On gabapentin for chronic pain. He does not have a pain management provider. Has a biannual blood work to check liver function.   Patient Active Problem List   Diagnosis Date Noted  . Chronic pain due to trauma 06/25/2013  . Depressive disorder, not elsewhere classified 11/09/2012  . CAD (coronary artery disease) 02/21/2012  . Lipid disorder 02/21/2012  . Angina pectoris 09/27/2011  . Obstructive sleep apnea 05/20/2010  . HYPERTENSION 04/17/2010  . EMPHYSEMA 04/17/2010  . WEIGHT GAIN, ABNORMAL 04/17/2010   Past Medical History  Diagnosis Date  . Unspecified essential hypertension   . OSA (obstructive sleep apnea)   . Morbid obesity   . Depression   . Anxiety    Past Surgical History  Procedure Laterality Date  . Tonsillectomy    . Leg surgery     Allergies  Allergen Reactions  . Penicillins     REACTION: anaphylaxis  . Valsartan     REACTION: itch/swelling   Prior to Admission medications   Medication Sig Start Date End Date Taking? Authorizing Provider  Armodafinil (NUVIGIL) 150 MG tablet Take 150 mg by mouth daily.   Yes Historical Provider, MD  aspirin 81 MG  tablet Take 1 tablet (81 mg total) by mouth daily. 11/13/12  Yes [name], NP  b complex vitamins tablet Take 1 tablet by mouth daily.   Yes Historical Provider, MD  calcium gluconate 500 MG tablet Take 500 mg by mouth daily.   Yes Historical Provider, MD  Cholecalciferol (VITAMIN D) 2000 UNITS tablet Take 1 tablet (2,000 Units total) by mouth daily. 11/13/12  Yes [name], NP  famotidine (PEPCID) 20 MG tablet Take 20 mg by mouth 2 (two) times daily as needed for heartburn.    Yes Historical Provider, MD  gabapentin (NEURONTIN) 300 MG capsule TAKE 3 BY MOUTH THREE TIMES DAILY 01/30/15  Yes Elvina Sidle,  MD  hydrOXYzine (ATARAX/VISTARIL) 50 MG tablet Take 2 tab at bed time 12/18/14  Yes Cleotis Nipper, MD  losartan-hydrochlorothiazide (HYZAAR) 50-12.5 MG per tablet TAKE 1 BY MOUTH DAILY --S.WEBER 01/30/15  Yes Elvina Sidle, MD  Lutein 40 MG CAPS Take 1 capsule by mouth daily.   Yes Historical Provider, MD  magnesium gluconate (MAGONATE) 500 MG tablet Take 500 mg by mouth daily.   Yes Historical Provider, MD  Multiple Vitamin (MULTIVITAMIN) capsule Take 1 capsule by mouth daily. 11/13/12  Yes [name], NP  nitroGLYCERIN (NITROSTAT) 0.4 MG SL tablet Place 1 tablet (0.4 mg total) under the tongue every 5 (five) minutes as needed for chest pain. If chest pain not resolved, after 3 doses 5 minutes apart--call 911 11/13/12  Yes [name], NP  omeprazole (PRILOSEC) 20 MG capsule Take 20 mg by mouth daily.   Yes Historical Provider, MD  Potassium 99 MG TABS Take 1 tablet by mouth daily.   Yes Historical Provider, MD  sertraline (ZOLOFT) 100 MG tablet Take 1 tablet (100 mg total) by mouth daily. 12/18/14  Yes Cleotis Nipper, MD  Shark Cartilage 740 MG CAPS Take 1 capsule by mouth 3 (three) times daily.   Yes Historical Provider, MD  simvastatin (ZOCOR) 20 MG tablet TAKE 1 BY MOUTH AT BEDTIME --S.WEBER 01/30/15  Yes Elvina Sidle, MD  traMADol (ULTRAM) 50 MG tablet Take 50 mg by mouth every 6 (six) hours as needed for pain.   Yes Historical Provider, MD  traZODone (DESYREL) 100 MG tablet Take 1 tablet (100 mg total) by mouth at bedtime. 12/18/14  Yes Cleotis Nipper, MD  triamcinolone cream (KENALOG) 0.1 % Apply 1 application topically 2 (two) times daily. 11/29/13  Yes Elvina Sidle, MD   History   Social History  . Marital Status: Married    Spouse Name: N/A  . Number of Children: N/A  . 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 Smoking status: Former Smoker -- 2.00 packs/day for 26 years    Types: Cigarettes    Quit date: 08/13/2003  .  Smokeless tobacco: Never Used     Comment: 2ppd x 26 years  . Alcohol Use: No  . Drug Use: No  . Sexual Activity: Yes   Other Topics Concern  . Not on file   Social History Narrative   Review of Systems  Constitutional: Negative for fatigue and unexpected weight change.  Eyes: Negative for visual disturbance.  Respiratory: Negative for cough, chest tightness and shortness of breath.   Cardiovascular: Negative for chest pain, palpitations and leg swelling.  Gastrointestinal: Negative for abdominal pain and blood in stool.  Neurological: Negative for dizziness, light-headedness and headaches.  Objective:  BP 147/83 mmHg  Pulse 76  Temp(Src) 98.3 F (36.8 C) (Oral)  Resp 16  Ht  (1.854 m)  Wt 350 lb (158.759 kg)  BMI 46.19 kg/m2 Physical Exam  Constitutional: He is oriented to person, place, and time. He appears well-developed and well-nourished. No distress.  HENT:  Head: Normocephalic and atraumatic.  Eyes: EOM are normal. Pupils are equal, round, and reactive to light.  Neck: Normal range of motion. No JVD present. Carotid bruit is not present.  Cardiovascular: Normal rate, regular rhythm and normal heart sounds.   No murmur heard. Pulmonary/Chest: Effort normal and breath sounds normal. No respiratory distress. He has no rales.  Musculoskeletal: Normal range of motion. He exhibits no edema.  Firm area over the base of the third metacarpal. Free of pain with full ROM.   Neurological: He is alert and oriented to person, place, and time.  Skin: Skin is warm and dry.  Psychiatric: He has a normal mood and affect. His behavior is normal.  Nursing note and vitals reviewed.      Assessment & Plan:   DOMINYK LAW is a 54 y.o. male Right hand pain - Plan: Ambulatory referral to Hand Surgery  -  Possible ganglion cyst.  Episodic discomfort. will refer to Dr. Amanda Pea  at [facility] for evaluation.  Coronary artery disease with unspecified angina  pectoris  -  Symptomatically stable without change in angina.  Continue regular follow-up with cardiology.  Advised to call Dr. Allyson Sabal for appointment.  Essential hypertension - Plan: COMPLETE METABOLIC PANEL WITH GFR, Lipid panel  - Borderline in office.  Check home blood pressures and bring to next office visit with me and cardiology.  Hyperlipidemia - Plan: COMPLETE METABOLIC PANEL WITH GFR, Lipid panel  -  Labs pending. Continue Zocor 20 mg daily.  High risk medication use - Plan: COMPLETE METABOLIC PANEL WITH GFR  -  On chronic  Neurontin. CMP pending  Hyperglycemia - Plan: Hemoglobin A1c  - A1c pending  No orders of the defined types were placed in this encounter.   Patient Instructions  Call Dr. Allyson Sabal for appointment.  You should receive a call or letter about your lab results within the next week to 10 days.   I referred you to Dr. Amanda Pea for your hand pain.    I will check your 3 month average of blood sugars and can discuss this with lab results or further at next visit.   Keep a record of your blood pressures outside of the office and bring them to the next office visit here or your cardiologist.   Follow up as planned in October.           I personally performed the services described in this documentation, which was scribed in my presence. The recorded information has been reviewed and considered, and addended by me as needed.

## 2015-02-10 NOTE — Patient Instructions (Addendum)
Call Dr. Allyson Sabal for appointment.  You should receive a call or letter about your lab results within the next week to 10 days.   I referred you to Dr. Amanda Pea for your hand pain.    I will check your 3 month average of blood sugars and can discuss this with lab results or further at next visit.   Keep a record of your blood pressures outside of the office and bring them to the next office visit here or your cardiologist.   Follow up as planned in October.

## 2015-03-06 ENCOUNTER — Ambulatory Visit (INDEPENDENT_AMBULATORY_CARE_PROVIDER_SITE_OTHER): Payer: BLUE CROSS/BLUE SHIELD | Admitting: Family Medicine

## 2015-03-06 VITALS — BP 150/84 | HR 83 | Temp 98.5°F | Resp 20 | Ht 72.0 in | Wt 345.2 lb

## 2015-03-06 DIAGNOSIS — S61219A Laceration without foreign body of unspecified finger without damage to nail, initial encounter: Secondary | ICD-10-CM | POA: Diagnosis not present

## 2015-03-06 DIAGNOSIS — Z23 Encounter for immunization: Secondary | ICD-10-CM | POA: Diagnosis not present

## 2015-03-06 NOTE — Progress Notes (Signed)
Subjective:  This chart was scribed for Elvina Sidle, MD by Broadus John, Medical Scribe. This patient was seen in Room 3 and the patient's care was started at 4:35 PM.   Patient ID: Matthew Hutchinson, male    DOB: May 28, 1961, 54 y.o.   MRN: [id number]  Chief Complaint  Patient presents with   Cut the tip of Rt 5-th finger    x yesterday    HPI HPI Comments: Matthew Hutchinson is a 54 y.o. male who presents to [facility] complaining of a laceration on the right fifth finger secondary to a cut by a mandolin slicer last night. Pt notes that dermabond applied to open the wound with good hemostasis.    Patient Active Problem List   Diagnosis Date Noted   Chronic pain due to trauma 06/25/2013   Depressive disorder, not elsewhere classified 11/09/2012   CAD (coronary artery disease) 02/21/2012   Lipid disorder 02/21/2012   Angina pectoris 09/27/2011   Obstructive sleep apnea 05/20/2010   HYPERTENSION 04/17/2010   EMPHYSEMA 04/17/2010   WEIGHT GAIN, ABNORMAL 04/17/2010   Past Medical History  Diagnosis Date   Unspecified essential hypertension    OSA (obstructive sleep apnea)    Morbid obesity    Depression    Anxiety    Past Surgical History  Procedure Laterality Date   Tonsillectomy     Leg surgery     Allergies  Allergen Reactions   Penicillins     REACTION: anaphylaxis   Valsartan     REACTION: itch/swelling   Prior to Admission medications   Medication Sig Start Date End Date Taking? Authorizing Provider  Armodafinil (NUVIGIL) 150 MG tablet Take 150 mg by mouth daily.   Yes Historical Provider, MD  aspirin 81 MG tablet Take 1 tablet (81 mg total) by mouth daily. 11/13/12  Yes [name], NP  b complex vitamins tablet Take 1 tablet by mouth daily.   Yes Historical Provider, MD  calcium gluconate 500 MG tablet Take 500 mg by mouth daily.   Yes Historical Provider, MD  Cholecalciferol (VITAMIN D) 2000 UNITS  tablet Take 1 tablet (2,000 Units total) by mouth daily. 11/13/12  Yes [name], NP  famotidine (PEPCID) 20 MG tablet Take 20 mg by mouth 2 (two) times daily as needed for heartburn.    Yes Historical Provider, MD  gabapentin (NEURONTIN) 300 MG capsule TAKE 3 BY MOUTH THREE TIMES DAILY 01/30/15  Yes Elvina Sidle, MD  hydrOXYzine (ATARAX/VISTARIL) 50 MG tablet Take 2 tab at bed time 12/18/14  Yes Cleotis Nipper, MD  losartan-hydrochlorothiazide (HYZAAR) 50-12.5 MG per tablet TAKE 1 BY MOUTH DAILY --S.WEBER 01/30/15  Yes Elvina Sidle, MD  Lutein 40 MG CAPS Take 1 capsule by mouth daily.   Yes Historical Provider, MD  magnesium gluconate (MAGONATE) 500 MG tablet Take 500 mg by mouth daily.   Yes Historical Provider, MD  Multiple Vitamin (MULTIVITAMIN) capsule Take 1 capsule by mouth daily. 11/13/12  Yes [name], NP  nitroGLYCERIN (NITROSTAT) 0.4 MG SL tablet Place 1 tablet (0.4 mg total) under the tongue every 5 (five) minutes as needed for chest pain. If chest pain not resolved, after 3 doses 5 minutes apart--call 911 11/13/12  Yes [name], NP  omeprazole (PRILOSEC) 20 MG capsule Take 20 mg by mouth daily.   Yes Historical Provider, MD  Potassium 99 MG TABS Take 1 tablet by mouth daily.   Yes Historical Provider, MD  sertraline (ZOLOFT) 100 MG tablet Take 1 tablet (100 mg total) by mouth daily. 12/18/14  Yes Cleotis Nipper, MD  Shark Cartilage 740 MG CAPS Take 1 capsule by mouth 3 (three) times daily.   Yes Historical Provider, MD  simvastatin (ZOCOR) 20 MG tablet TAKE 1 BY MOUTH AT BEDTIME --S.WEBER 01/30/15  Yes Elvina Sidle, MD  traMADol (ULTRAM) 50 MG tablet Take 50 mg by mouth every 6 (six) hours as needed for pain.   Yes Historical Provider, MD  traZODone (DESYREL) 100 MG tablet Take 1 tablet (100 mg total) by mouth at bedtime. 12/18/14  Yes Cleotis Nipper, MD  triamcinolone cream (KENALOG) 0.1 % Apply 1 application topically 2 (two) times daily. 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      Objective:   Physical Exam  Constitutional: He is oriented to person, place, and time. He appears well-developed and well-nourished. No distress.  HENT:  Head: Normocephalic and atraumatic.  Eyes: EOM are normal. Pupils are equal, round, and reactive to light.  Neck: Neck supple.  Cardiovascular: Normal rate.   Pulmonary/Chest: Effort normal.  Neurological: He is alert and oriented to person, place, and time. No cranial nerve deficit.  Skin: Skin is warm and dry.  Psychiatric: He has a normal mood and affect. His behavior is normal.  Nursing note and vitals reviewed.  BP 150/84 mmHg   Pulse 83   Temp(Src) 98.5 F (36.9 C) (Oral)   Resp 20   Ht 6' (1.829 m)   Wt 345 lb 3.2 oz (156.582 kg)   BMI 46.81 kg/m2   SpO2 96% Distal 1/2 cm of right pinky finger, dorsal aspect involving nail, with superficial laceration. This was cleansed with peroxide and saline and then the open wound was sealed with Dermabond. She tolerated procedure well. A tube dressing was then applied.    Assessment & Plan:   This chart was scribed in my presence and reviewed by me personally.    ICD-9-CM ICD-10-CM   1. Finger laceration, initial encounter 883.0 S61.219A Tdap vaccine greater than or equal to 7yo IM     Signed, Elvina Sidle, MD

## 2015-03-18 ENCOUNTER — Other Ambulatory Visit (HOSPITAL_COMMUNITY): Payer: Self-pay | Admitting: Psychiatry

## 2015-03-20 ENCOUNTER — Ambulatory Visit (INDEPENDENT_AMBULATORY_CARE_PROVIDER_SITE_OTHER): Payer: BLUE CROSS/BLUE SHIELD | Admitting: Psychiatry

## 2015-03-20 ENCOUNTER — Encounter (HOSPITAL_COMMUNITY): Payer: Self-pay | Admitting: Psychiatry

## 2015-03-20 VITALS — BP 140/82 | HR 82 | Ht 74.5 in | Wt 352.8 lb

## 2015-03-20 DIAGNOSIS — F339 Major depressive disorder, recurrent, unspecified: Secondary | ICD-10-CM

## 2015-03-20 DIAGNOSIS — F330 Major depressive disorder, recurrent, mild: Secondary | ICD-10-CM

## 2015-03-20 MED ORDER — SERTRALINE HCL 100 MG PO TABS: 100.0000 mg | ORAL_TABLET | Freq: Every day | ORAL | Status: DC

## 2015-03-20 NOTE — Progress Notes (Signed)
[name] [phone] Progress Note  [name] [id number] 54 y.o.  03/20/2015 4:30 PM  Chief Complaint:  Medication management and follow-up.  History of Present Illness:  Matthew Hutchinson came for his follow-up appointment.  He is taking is Zoloft every day and sometime he takes Vistaril and trazodone when he is very anxious and having trouble sleeping.  He has chronic issues with his partner and he admitted some time gets very frustrated with him.  He believed his partner is drinking heavily and he have talked few times to make decision to take control of parole .  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  However overall things are going very well.  He is happy in his marriage.  His partner is very supportive.  He denies any agitation, anger but admitted some time irritability.  He sleeping good.  He denies any paranoia or any hallucination.  He denies any shakes or tremors.  Patient denies drinking or using any illegal substances.  His appetite is okay. He recently seen his primary care physician and he has blood work.  His CBC is normal.  His hemoglobin A1c is 6.5.  He admitted weight gain but he is working on weight loss.  Suicidal Ideation: No Plan Formed: No Patient has means to carry out plan: No  Homicidal Ideation: No Plan Formed: No Patient has means to carry out plan: No  Medical History; Patient has hypertension, obesity, angina, coronary artery disease, hyperlipidemia and obstructive sleep apnea. His primary care physician is Dr. Birdena Jubilee. Patient has no history of seizures.   Past Psychiatric History/Hospitalization(s) Patient was admitted to behavioral [facility] after taken overdose on Ambien. He has history of taking overdose on Ambien 2 other times but does not require psychiatric inpatient treatment. He admitted history of bad temper mood swing severe anger rage and poor sleep. He endorse history of  manic-like symptoms when he gets very impulsive and aggressive. He has history of getting speeding tickets. He denies any psychosis or any hallucination. He was never tried any psychotropic medication other than Xanax and Ambien by his primary care physician Anxiety: Yes Bipolar Disorder: No Depression: No Mania: Yes Psychosis: No Schizophrenia: No Personality Disorder: No Hospitalization for psychiatric illness: Yes History of Electroconvulsive Shock Therapy: No Prior Suicide Attempts: Yes   Review of Systems: Psychiatric: Agitation: No Hallucination: No Depressed Mood: No Insomnia: No Hypersomnia: No Altered Concentration: No Feels Worthless: No Grandiose Ideas: No Belief In Special Powers: No New/Increased Substance Abuse: No Compulsions: No  Neurologic: Headache: No Seizure: No Paresthesias: No  Outpatient Encounter Prescriptions as of 03/20/2015  Medication Sig  . Armodafinil (NUVIGIL) 150 MG tablet Take 150 mg by mouth daily.  Marland Kitchen aspirin 81 MG tablet Take 1 tablet (81 mg total) by mouth daily.  Marland Kitchen b complex vitamins tablet Take 1 tablet by mouth daily.  . calcium gluconate 500 MG tablet Take 500 mg by mouth daily.  . Cholecalciferol (VITAMIN D) 2000 UNITS tablet Take 1 tablet (2,000 Units total) by mouth daily.  . famotidine (PEPCID) 20 MG tablet Take 20 mg by mouth 2 (two) times daily as needed for heartburn.   . gabapentin (NEURONTIN) 300 MG capsule TAKE 3 BY MOUTH THREE TIMES DAILY  . hydrOXYzine (ATARAX/VISTARIL) 50 MG tablet Take 2 tab at bed time  . losartan-hydrochlorothiazide (HYZAAR) 50-12.5 MG per tablet TAKE 1 BY MOUTH DAILY --S.WEBER  . Lutein 40 MG CAPS Take 1 capsule  by mouth daily.  . magnesium gluconate (MAGONATE) 500 MG tablet Take 500 mg by mouth daily.  . Multiple Vitamin (MULTIVITAMIN) capsule Take 1 capsule by mouth daily.  . nitroGLYCERIN (NITROSTAT) 0.4 MG SL tablet Place 1 tablet (0.4 mg total) under the tongue every 5 (five) minutes as needed  for chest pain. If chest pain not resolved, after 3 doses 5 minutes apart--call 911  . omeprazole (PRILOSEC) 20 MG capsule Take 20 mg by mouth daily.  . Potassium 99 MG TABS Take 1 tablet by mouth daily.  . sertraline (ZOLOFT) 100 MG tablet Take 1 tablet (100 mg total) by mouth daily.  Berniece Pap Cartilage 740 MG CAPS Take 1 capsule by mouth 3 (three) times daily.  . simvastatin (ZOCOR) 20 MG tablet TAKE 1 BY MOUTH AT BEDTIME --S.WEBER  . traMADol (ULTRAM) 50 MG tablet Take 50 mg by mouth every 6 (six) hours as needed for pain.  . traZODone (DESYREL) 100 MG tablet Take 1 tablet (100 mg total) by mouth at bedtime.  . triamcinolone cream (KENALOG) 0.1 % Apply 1 application topically 2 (two) times daily.  . [DISCONTINUED] sertraline (ZOLOFT) 100 MG tablet Take 1 tablet (100 mg total) by mouth daily.   No facility-administered encounter medications on file as of 03/20/2015.    Recent Results (from the past 2160 hour(s))  COMPLETE METABOLIC PANEL WITH GFR     Status: Abnormal   Collection Time: 02/10/15  6:09 PM  Result Value Ref Range   Sodium 139 135 - 146 mmol/L   Potassium 4.4 3.5 - 5.3 mmol/L   Chloride 103 98 - 110 mmol/L   CO2 26 20 - 31 mmol/L   Glucose, Bld 128 (H) 65 - 99 mg/dL   BUN 12 7 - 25 mg/dL   Creat 1.08 0.70 - 1.33 mg/dL   Total Bilirubin 0.5 0.2 - 1.2 mg/dL   Alkaline Phosphatase 82 40 - 115 U/L   AST 28 10 - 35 U/L   ALT 29 9 - 46 U/L   Total Protein 6.7 6.1 - 8.1 g/dL   Albumin 4.2 3.6 - 5.1 g/dL   Calcium 9.9 8.6 - 10.3 mg/dL   GFR, Est African American >89 >=60 mL/min   GFR, Est Non African American 78 >=60 mL/min    Comment:   The estimated GFR is a calculation valid for adults (>=32 years old) that uses the CKD-EPI algorithm to adjust for age and sex. It is   not to be used for children, pregnant women, hospitalized patients,    patients on dialysis, or with rapidly changing kidney function. According to the NKDEP, eGFR >89 is normal, 60-89 shows  mild impairment, 30-59 shows moderate impairment, 15-29 shows severe impairment and <15 is ESRD.     Lipid panel     Status: Abnormal   Collection Time: 02/10/15  6:09 PM  Result Value Ref Range   Cholesterol 147 125 - 200 mg/dL   Triglycerides 155 (H) <150 mg/dL   HDL 49 >=40 mg/dL   Total CHOL/HDL Ratio 3.0 <=5.0 Ratio   VLDL 31 (H) <30 mg/dL   LDL Cholesterol 67 <130 mg/dL    Comment:   Total Cholesterol/HDL Ratio:CHD Risk                        Coronary Heart Disease Risk Table  Men       Women          1/2 Average Risk              3.4        3.3              Average Risk              5.0        4.4           2X Average Risk              9.6        7.1           3X Average Risk             23.4       11.0 Use the calculated Patient Ratio above and the CHD Risk table  to determine the patient's CHD Risk. ATP III Classification (LDL):       < 100        mg/dL         Optimal      100 - 129     mg/dL         Near or Above Optimal      130 - 159     mg/dL         Borderline High      160 - 189     mg/dL         High       > 190        mg/dL         Very High     Hemoglobin A1c     Status: Abnormal   Collection Time: 02/10/15  6:09 PM  Result Value Ref Range   Hgb A1c MFr Bld 6.5 (H) <5.7 %    Comment:                                                                        According to the ADA Clinical Practice Recommendations for 2011, when HbA1c is used as a screening test:     >=6.5%   Diagnostic of Diabetes Mellitus            (if abnormal result is confirmed)   5.7-6.4%   Increased risk of developing Diabetes Mellitus   References:Diagnosis and Classification of Diabetes Mellitus,Diabetes JGGE,3662,94(TMLYY 1):S62-S69 and Standards of Medical Care in         Diabetes - 2011,Diabetes Care,2011,34 (Suppl 1):S11-S61.      Mean Plasma Glucose 140 (H) <117 mg/dL    Comment:   Footnotes:  (1) ** Please note change in unit of  measure and reference range(s). **       Physical Exam: Consitutional ;  BP 140/82 mmHg  Pulse 82  Ht 6' 2.5" (1.892 m)  Wt 352 lb 12.8 oz (160.029 kg)  BMI 44.71 kg/m2  Musculoskeletal: Strength & Muscle Tone: within normal limits Gait & Station: normal Patient leans: N/A   Psychiatric Specialty Exam: General Appearance: Well nourished and obese  Eye Contact::  Fair  Speech:  high pitch with increased tone  Volume:  Increased  Mood:  Anxious  Affect:  Congruent  Thought Process:  Goal Directed  Orientation:  Full (Time, Place, and Person)  Thought Content:  WDL  Suicidal Thoughts:  No  Homicidal Thoughts:  No  Memory:  Immediate;   Good Recent;   Good Remote;   Good  Judgement:  Good  Insight:  Good  Psychomotor Activity:  EPS  Concentration:  Fair  Recall:  [place] of Knowledge:  Good  Language:  Good  Akathisia:  No  Handed:  Right  AIMS (if indicated):     Assets:  Communication Skills Desire for Improvement Financial Resources/Insurance Housing Physical Health Social Support  ADL's:  Intact  Cognition:  WNL  Sleep:        Established Problem, Stable/Improving (1), Review or order clinical lab tests (1), Review of Last Therapy Session (1) and Review of Medication Regimen & Side Effects (2)  Assessment: Major depressive disorder, recurrent  Axis III:  Past Medical History  Diagnosis Date  . Unspecified essential hypertension   . OSA (obstructive sleep apnea)   . Morbid obesity   . Depression   . Anxiety     Plan:  Patient is doing better on Zoloft 100 mg daily.  He has no side effects.  He takes Vistaril and trazodone only as needed.  Discussed medication side effects and benefits.  Recommended to call us back if he has any question or any concern.  Discussed sleep hygiene and weight maintenance and encouraged to watch his calorie intake and weight loss.  Follow-up in 3 months.  Wilmont Olund T., MD 03/20/2015

## 2015-04-14 ENCOUNTER — Encounter: Payer: Self-pay | Admitting: Family Medicine

## 2015-04-28 ENCOUNTER — Other Ambulatory Visit: Payer: Self-pay | Admitting: Family Medicine

## 2015-05-26 ENCOUNTER — Encounter: Payer: Self-pay | Admitting: Family Medicine

## 2015-06-02 ENCOUNTER — Ambulatory Visit (INDEPENDENT_AMBULATORY_CARE_PROVIDER_SITE_OTHER): Payer: BLUE CROSS/BLUE SHIELD | Admitting: Family Medicine

## 2015-06-02 ENCOUNTER — Encounter: Payer: Self-pay | Admitting: Family Medicine

## 2015-06-02 VITALS — BP 150/78 | HR 76 | Temp 98.9°F | Resp 16 | Ht 72.5 in | Wt 347.4 lb

## 2015-06-02 DIAGNOSIS — Z23 Encounter for immunization: Secondary | ICD-10-CM | POA: Diagnosis not present

## 2015-06-02 DIAGNOSIS — E669 Obesity, unspecified: Secondary | ICD-10-CM | POA: Diagnosis not present

## 2015-06-02 DIAGNOSIS — I2510 Atherosclerotic heart disease of native coronary artery without angina pectoris: Secondary | ICD-10-CM

## 2015-06-02 DIAGNOSIS — E119 Type 2 diabetes mellitus without complications: Secondary | ICD-10-CM

## 2015-06-02 DIAGNOSIS — M79602 Pain in left arm: Secondary | ICD-10-CM | POA: Diagnosis not present

## 2015-06-02 DIAGNOSIS — G8929 Other chronic pain: Secondary | ICD-10-CM

## 2015-06-02 DIAGNOSIS — M79605 Pain in left leg: Secondary | ICD-10-CM

## 2015-06-02 DIAGNOSIS — R0981 Nasal congestion: Secondary | ICD-10-CM

## 2015-06-02 DIAGNOSIS — Z125 Encounter for screening for malignant neoplasm of prostate: Secondary | ICD-10-CM

## 2015-06-02 DIAGNOSIS — H547 Unspecified visual loss: Secondary | ICD-10-CM | POA: Diagnosis not present

## 2015-06-02 DIAGNOSIS — G4733 Obstructive sleep apnea (adult) (pediatric): Secondary | ICD-10-CM

## 2015-06-02 DIAGNOSIS — Z0000 Encounter for general adult medical examination without abnormal findings: Secondary | ICD-10-CM

## 2015-06-02 DIAGNOSIS — I10 Essential (primary) hypertension: Secondary | ICD-10-CM | POA: Diagnosis not present

## 2015-06-02 DIAGNOSIS — Z9989 Dependence on other enabling machines and devices: Secondary | ICD-10-CM

## 2015-06-02 LAB — POCT GLYCOSYLATED HEMOGLOBIN (HGB A1C): HEMOGLOBIN A1C: 6.3

## 2015-06-02 LAB — GLUCOSE, POCT (MANUAL RESULT ENTRY): POC GLUCOSE: 123 mg/dL — AB (ref 70–99)

## 2015-06-02 LAB — HEMOGLOBIN A1C: HEMOGLOBIN A1C: 6.3 % — AB (ref 4.0–6.0)

## 2015-06-02 MED ORDER — GABAPENTIN 300 MG PO CAPS: ORAL_CAPSULE | ORAL | Status: DC

## 2015-06-02 MED ORDER — LOSARTAN POTASSIUM-HCTZ 50-12.5 MG PO TABS: ORAL_TABLET | ORAL | Status: DC

## 2015-06-02 NOTE — Patient Instructions (Addendum)
Call and schedule appointment with Dr. Allyson Sabal. Flonase or claritin over the counter if needed for congestion or allergies, return if any shortness of breath or wheezing.  Keep a record of your blood pressures outside of the office and if remaining over 140/90 - return to discuss med changes.  Follow up with Dr. [name] as soon as you can for the vision.  Can follow up with nutritionist to look into other ways to increase frequency and decrease portion sizes of meals, and let me know if you would like another referral to bariatric surgery or their financial person would be helpful. Your blood sugar is better today - down into prediabetes range. Can recheck this again in 6 months with other bloodwork.  Return to the clinic or go to the nearest emergency room if any of your symptoms worsen or new symptoms occur.  Keeping you healthy  Get these tests  Blood pressure- Have your blood pressure checked once a year by your healthcare provider.  Normal blood pressure is 120/80  Weight- Have your body mass index (BMI) calculated to screen for obesity.  BMI is a measure of body fat based on height and weight. You can also calculate your own BMI at [email or web address].  Cholesterol- Have your cholesterol checked every year.  Diabetes- Have your blood sugar checked regularly if you have high blood pressure, high cholesterol, have a family history of diabetes or if you are overweight.  Screening for Colon Cancer- Colonoscopy starting at age 68.  Screening may begin sooner depending on your family history and other health conditions. Follow up colonoscopy as directed by your Gastroenterologist.  Screening for Prostate Cancer- Both blood work (PSA) and a rectal exam help screen for Prostate Cancer.  Screening begins at age 95 with African-American men and at age 46 with Caucasian men.  Screening may begin sooner depending on your family history.  Take these medicines  Aspirin- One aspirin daily can help  prevent Heart disease and Stroke.  Flu shot- Every fall.  Tetanus- Every 10 years.  Zostavax- Once after the age of 19 to prevent Shingles.  Pneumonia shot- Once after the age of 70; if you are younger than 25, ask your healthcare provider if you need a Pneumonia shot.  Take these steps  Don't smoke- If you do smoke, talk to your doctor about quitting.  For tips on how to quit, go to www.smokefree.gov or call 1-800-QUIT-NOW.  Be physically active- Exercise 5 days a week for at least 30 minutes.  If you are not already physically active start slow and gradually work up to 30 minutes of moderate physical activity.  Examples of moderate activity include walking briskly, mowing the yard, dancing, swimming, bicycling, etc.  Eat a healthy diet- Eat a variety of healthy food such as fruits, vegetables, low fat milk, low fat cheese, yogurt, lean meant, poultry, fish, beans, tofu, etc. For more information go to www.thenutritionsource.org  Drink alcohol in moderation- Limit alcohol intake to less than two drinks a day. Never drink and drive.  Dentist- Brush and floss twice daily; visit your dentist twice a year.  Depression- Your emotional health is as important as your physical health. If you're feeling down, or losing interest in things you would normally enjoy please talk to your healthcare provider.  Eye exam- Visit your eye doctor every year.  Safe sex- If you may be exposed to a sexually transmitted infection, use a condom.  Seat belts- Seat belts can save your life; always wear one.  Smoke/Carbon Monoxide detectors- These detectors need to be installed on the appropriate level of your home.  Replace batteries at least once a year.  Skin cancer- When out in the sun, cover up and use sunscreen 15 SPF or higher.  Violence- If anyone is threatening you, please tell your healthcare provider. Living Will/ Health care power of attorney- Speak with your healthcare provider and family.
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
## 2015-06-02 NOTE — Progress Notes (Signed)
   Subjective:    Patient ID: Matthew Hutchinson, male    DOB: 04/01/1961, 54 y.o.   MRN: [id number]  HPI    Review of Systems  Constitutional: Positive for diaphoresis.  HENT: Positive for nosebleeds and tinnitus.   Eyes: Negative.   Respiratory: Negative.   Cardiovascular: Positive for leg swelling.  Gastrointestinal: Negative.   Endocrine: Negative.   Genitourinary: Negative.   Musculoskeletal: Positive for myalgias, back pain, arthralgias, gait problem, neck pain and neck stiffness.  Skin: Positive for rash.  Allergic/Immunologic: Negative.   Neurological: Negative.   Hematological: Negative.   Psychiatric/Behavioral: Positive for self-injury and dysphoric mood. The patient is nervous/anxious.        Objective:   Physical Exam        Assessment & Plan:

## 2015-06-03 LAB — PSA: PSA: 0.47 ng/mL (ref <=4.00)

## 2015-06-12 ENCOUNTER — Encounter: Payer: Self-pay | Admitting: Family Medicine

## 2015-06-19 ENCOUNTER — Ambulatory Visit (INDEPENDENT_AMBULATORY_CARE_PROVIDER_SITE_OTHER): Payer: BLUE CROSS/BLUE SHIELD | Admitting: Psychiatry

## 2015-06-19 ENCOUNTER — Encounter (HOSPITAL_COMMUNITY): Payer: Self-pay | Admitting: Psychiatry

## 2015-06-19 VITALS — BP 122/66 | HR 88 | Ht 79.5 in | Wt 350.8 lb

## 2015-06-19 DIAGNOSIS — F330 Major depressive disorder, recurrent, mild: Secondary | ICD-10-CM

## 2015-06-19 MED ORDER — TRAZODONE HCL 100 MG PO TABS: 100.0000 mg | ORAL_TABLET | Freq: Every day | ORAL | Status: DC

## 2015-06-19 MED ORDER — HYDROXYZINE HCL 50 MG PO TABS: ORAL_TABLET | ORAL | Status: DC

## 2015-06-19 MED ORDER — SERTRALINE HCL 100 MG PO TABS: 100.0000 mg | ORAL_TABLET | Freq: Every day | ORAL | Status: DC

## 2015-06-19 NOTE — Progress Notes (Signed)
[facility] [id number] Progress Note  Matthew Hutchinson [id number] 54 y.o.  06/19/2015 5:16 PM  Chief Complaint:  Medication management and follow-up.  History of Present Illness:  Matthew Hutchinson came for his follow-up appointment.  He is taking his medication as prescribed.  He is relieved that his partner is lately doing much better and there has been no new issues.  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  He denies any irritability or any anger issues.  He denies any mania or any psychosis.  He has no side effects.  His sleep is good.  Recently he seen his new primary care physician and he was pleased with the care.  He has blood work and his hemoglobin A1c is 6.3.  His blood pressure was slightly increased but today it was normal.  His energy level is good.  He denies any panic attack, paranoia or any hallucination.  His marriage is going very well.  He has no tremors or shakes.  He is more careful about his diet and watching his calorie intake.  He denies drinking or using any illegal substances.  He wants to continue his current psychiatric medication.  Suicidal Ideation: No Plan Formed: No Patient has means to carry out plan: No  Homicidal Ideation: No Plan Formed: No Patient has means to carry out plan: No  Medical History; Patient has hypertension, obesity, angina, coronary artery disease, hyperlipidemia and obstructive sleep apnea. His primary care physician is Dr. [name]. Patient has no history of seizures.   Past Psychiatric History/Hospitalization(s) [facility] after taken overdose on Ambien. He has history of taking overdose on Ambien 2 other times but does not require psychiatric inpatient treatment. He admitted history of bad temper mood swing severe anger rage and poor sleep. He endorse history of manic-like symptoms when he gets very impulsive and aggressive. He has history of getting speeding tickets. He denies any psychosis or any  hallucination. He was never tried any psychotropic medication other than Xanax and Ambien by his primary care physician Anxiety: Yes Bipolar Disorder: No Depression: No Mania: Yes Psychosis: No Schizophrenia: No Personality Disorder: No Hospitalization for psychiatric illness: Yes History of Electroconvulsive Shock Therapy: No Prior Suicide Attempts: Yes   Review of Systems: Psychiatric: Agitation: No Hallucination: No Depressed Mood: No Insomnia: No Hypersomnia: No Altered Concentration: No Feels Worthless: No Grandiose Ideas: No Belief In Special Powers: No New/Increased Substance Abuse: No Compulsions: No  Neurologic: Headache: No Seizure: No Paresthesias: No  Outpatient Encounter Prescriptions as of 06/19/2015  Medication Sig  . Armodafinil (NUVIGIL) 150 MG tablet Take 150 mg by mouth daily.  Marland Kitchen aspirin 81 MG tablet Take 1 tablet (81 mg total) by mouth daily.  Marland Kitchen b complex vitamins tablet Take 1 tablet by mouth daily.  . calcium gluconate 500 MG tablet Take 500 mg by mouth daily.  . Cholecalciferol (VITAMIN D) 2000 UNITS tablet Take 1 tablet (2,000 Units total) by mouth daily.  . famotidine (PEPCID) 20 MG tablet Take 20 mg by mouth 2 (two) times daily as needed for heartburn.   . gabapentin (NEURONTIN) 300 MG capsule TAKE 3 BY MOUTH THREE TIMES DAILY  . hydrOXYzine (ATARAX/VISTARIL) 50 MG tablet Take 2 tab at bed time  . losartan-hydrochlorothiazide (HYZAAR) 50-12.5 MG tablet TAKE 1 BY MOUTH DAILY  . Lutein 40 MG CAPS Take 1 capsule by mouth daily.  . magnesium gluconate (MAGONATE) 500 MG tablet Take 500 mg by mouth daily.  . Multiple Vitamin (MULTIVITAMIN) capsule Take  1 capsule by mouth daily.  . nitroGLYCERIN (NITROSTAT) 0.4 MG SL tablet Place 1 tablet (0.4 mg total) under the tongue every 5 (five) minutes as needed for chest pain. If chest pain not resolved, after 3 doses 5 minutes apart--call 911  . omeprazole (PRILOSEC) 20 MG capsule Take 20 mg by mouth daily.   . Potassium 99 MG TABS Take 1 tablet by mouth daily.  . sertraline (ZOLOFT) 100 MG tablet Take 1 tablet (100 mg total) by mouth daily.  [name] 740 MG CAPS Take 1 capsule by mouth 3 (three) times daily.  . simvastatin (ZOCOR) 20 MG tablet TAKE 1 BY MOUTH AT BEDTIME  . traMADol (ULTRAM) 50 MG tablet Take 50 mg by mouth every 6 (six) hours as needed for pain.  . traZODone (DESYREL) 100 MG tablet Take 1 tablet (100 mg total) by mouth at bedtime.  . triamcinolone cream (KENALOG) 0.1 % Apply 1 application topically 2 (two) times daily.  . [DISCONTINUED] hydrOXYzine (ATARAX/VISTARIL) 50 MG tablet Take 2 tab at bed time  . [DISCONTINUED] sertraline (ZOLOFT) 100 MG tablet Take 1 tablet (100 mg total) by mouth daily.  . [DISCONTINUED] traZODone (DESYREL) 100 MG tablet Take 1 tablet (100 mg total) by mouth at bedtime.   No facility-administered encounter medications on file as of 06/19/2015.    Recent Results (from the past 2160 hour(s))  Hemoglobin A1c     Status: Abnormal   Collection Time: 06/02/15 12:00 AM  Result Value Ref Range   Hgb A1c MFr Bld 6.3 (A) 4.0 - 6.0 %  PSA     Status: None   Collection Time: 06/02/15  9:34 AM  Result Value Ref Range   PSA 0.47 <=4.00 ng/mL    Comment: Test Methodology: ECLIA PSA (Electrochemiluminescence Immunoassay)   For PSA values from 2.5-4.0, particularly in younger men <12 years old, the AUA and NCCN suggest testing for % Free PSA (3515) and evaluation of the rate of increase in PSA (PSA velocity).   POCT glucose (manual entry)     Status: Abnormal   Collection Time: 06/02/15  9:49 AM  Result Value Ref Range   POC Glucose 123 (A) 70 - 99 mg/dl  POCT glycosylated hemoglobin (Hb A1C)     Status: None   Collection Time: 06/02/15  9:49 AM  Result Value Ref Range   Hemoglobin A1C 6.3     Physical Exam: Consitutional ;  BP 122/66 mmHg  Pulse 88  Ht 6' 7.5" (2.019 m)  Wt 350 lb 12.8 oz (159.122 kg)  BMI 39.04  kg/m2  Musculoskeletal: Strength & Muscle Tone: within normal limits Gait & Station: normal Patient leans: N/A   Psychiatric Specialty Exam: General Appearance: Well nourished and obese  Eye Contact::  Fair  Speech:  high pitch with increased tone  Volume:  Increased  Mood:  Anxious  Affect:  Congruent  Thought Process:  Goal Directed  Orientation:  Full (Time, Place, and Person)  Thought Content:  WDL  Suicidal Thoughts:  No  Homicidal Thoughts:  No  Memory:  Immediate;   Good Recent;   Good Remote;   Good  Judgement:  Good  Insight:  Good  Psychomotor Activity:  EPS  Concentration:  Fair  Recall:  Fair  Fund of Knowledge:  Good  Language:  Good  Akathisia:  No  Handed:  Right  AIMS (if indicated):     Assets:  Communication Skills Desire for Improvement Financial Resources/Insurance Housing Physical Health Social Support  ADL's:  Intact  Cognition:  WNL  Sleep:        Established Problem, Stable/Improving (1), Review or order clinical lab tests (1), Review of Last Therapy Session (1) and Review of Medication Regimen & Side Effects (2)  Assessment: Major depressive disorder, recurrent  Axis III:  Past Medical History  Diagnosis Date  . Unspecified essential hypertension   . OSA (obstructive sleep apnea)   . Morbid obesity (HCC)   . Depression   . Anxiety   . Allergy   . Arthritis   . COPD (chronic obstructive pulmonary disease) (HCC)   . Emphysema of lung (HCC)     Plan:  Patient is stable on Zoloft 100 mg daily, trazodone 100 mg at bedtime and Vistaril 100 mg at bedtime.  He has no side effects.  Discuss blood work results especially hemoglobin A1c.  His vitals are stable.  Discussed medication side effects and benefits.  Recommended to call us back if he has any question or any concern.  Discussed sleep hygiene and weight maintenance and encouraged to watch his calorie intake and weight loss.  Follow-up in 3 months.  Siham Bucaro T.,  MD 06/19/2015

## 2015-06-30 ENCOUNTER — Ambulatory Visit (INDEPENDENT_AMBULATORY_CARE_PROVIDER_SITE_OTHER): Payer: BLUE CROSS/BLUE SHIELD | Admitting: Family Medicine

## 2015-06-30 ENCOUNTER — Other Ambulatory Visit (HOSPITAL_COMMUNITY): Payer: Self-pay | Admitting: Psychiatry

## 2015-06-30 ENCOUNTER — Encounter: Payer: Self-pay | Admitting: Family Medicine

## 2015-06-30 VITALS — BP 127/79 | HR 71 | Temp 98.0°F | Resp 16 | Ht 74.5 in | Wt 353.4 lb

## 2015-06-30 DIAGNOSIS — K429 Umbilical hernia without obstruction or gangrene: Secondary | ICD-10-CM

## 2015-06-30 NOTE — Patient Instructions (Signed)
I will refer you for surgeon to evaluate the hernia. See precautions below. Call your cardiologist for appointment as you may need cardiologist to clear you if surgery is planned.  Return to the clinic or go to the nearest emergency room if any of your symptoms worsen or new symptoms occur.  Hernia, Adult A hernia is the bulging of an organ or tissue through a weak spot in the muscles of the abdomen (abdominal wall). Hernias develop most often near the navel or groin. There are many kinds of hernias. Common kinds include:  Femoral hernia. This kind of hernia develops under the groin in the upper thigh area.  Inguinal hernia. This kind of hernia develops in the groin or scrotum.  Umbilical hernia. This kind of hernia develops near the navel.  Hiatal hernia. This kind of hernia causes part of the stomach to be pushed up into the chest.  Incisional hernia. This kind of hernia bulges through a scar from an abdominal surgery. CAUSES This condition may be caused by:  Heavy lifting.  Coughing over a long period of time.  Straining to have a bowel movement.  An incision made during an abdominal surgery.  A birth defect (congenital defect).  Excess weight or obesity.  Smoking.  Poor nutrition.  Cystic fibrosis.  Excess fluid in the abdomen.  Undescended testicles. SYMPTOMS Symptoms of a hernia include:  A lump on the abdomen. This is the first sign of a hernia. The lump may become more obvious with standing, straining, or coughing. It may get bigger over time if it is not treated or if the condition causing it is not treated.  Pain. A hernia is usually painless, but it may become painful over time if treatment is delayed. The pain is usually dull and may get worse with standing or lifting heavy objects. Sometimes a hernia gets tightly squeezed in the weak spot (strangulated) or stuck there (incarcerated) and causes additional symptoms. These symptoms may  include:  Vomiting.  Nausea.  Constipation.  Irritability. DIAGNOSIS A hernia may be diagnosed with:  A physical exam. During the exam your health care provider may ask you to cough or to make a specific movement, because a hernia is usually more visible when you move.  Imaging tests. These can include:  X-rays.  Ultrasound.  CT scan. TREATMENT A hernia that is small and painless may not need to be treated. A hernia that is large or painful may be treated with surgery. Inguinal hernias may be treated with surgery to prevent incarceration or strangulation. Strangulated hernias are always treated with surgery, because lack of blood to the trapped organ or tissue can cause it to die. Surgery to treat a hernia involves pushing the bulge back into place and repairing the weak part of the abdomen. HOME CARE INSTRUCTIONS  Avoid straining.  Do not lift anything heavier than 10 lb (4.5 kg).  Lift with your leg muscles, not your back muscles. This helps avoid strain.  When coughing, try to cough gently.  Prevent constipation. Constipation leads to straining with bowel movements, which can make a hernia worse or cause a hernia repair to break down. You can prevent constipation by:  Eating a high-fiber diet that includes plenty of fruits and vegetables.  Drinking enough fluids to keep your urine clear or pale yellow. Aim to drink 6-8 glasses of water per day.  Using a stool softener as directed by your health care provider.  Lose weight, if you are overweight.  Do not  use any tobacco products, including cigarettes, chewing tobacco, or electronic cigarettes. If you need help quitting, ask your health care provider.  Keep all follow-up visits as directed by your health care provider. This is important. Your health care provider may need to monitor your condition. SEEK MEDICAL CARE IF:  You have swelling, redness, and pain in the affected area.  Your bowel habits change. SEEK  IMMEDIATE MEDICAL CARE IF:  You have a fever.  You have abdominal pain that is getting worse.  You feel nauseous or you vomit.  You cannot push the hernia back in place by gently pressing on it while you are lying down.  The hernia:  Changes in shape or size.  Is stuck outside the abdomen.  Becomes discolored.  Feels hard or tender.   This information is not intended to replace advice given to you by your health care provider. Make sure you discuss any questions you have with your health care provider.   Document Released: 06/28/2005 Document Revised: 07/19/2014 Document Reviewed: 05/08/2014 Elsevier Interactive Patient Education [employer]

## 2015-06-30 NOTE — Progress Notes (Addendum)
Subjective:  This chart was scribed for [name], MD by Stann Ore, Medical Scribe. This patient was seen in room 26 and the patient's care was started 12:23 PM.   Patient ID: Matthew Hutchinson, male    DOB: 04-15-1961, 54 y.o.   MRN: [id number] Chief Complaint  Patient presents with  . Umbilical Hernia    HPI Matthew Hutchinson is a 54 y.o. male Here for possible umbilical hernia.   He first noticed it 5 days ago. He states that around 2:00PM that day, he didn't notice it. But, when he came home that night at around 11:00PM, he noticed it. His husband had similar issues with umbilical hernia. He did heavy lifting of packages a day prior to start of the hernia. He has pain in the area when sneezing. He denies constipation.   Cardiology He plans to make an appointment with cardiology first of the new year.   Patient Active Problem List   Diagnosis Date Noted  . Chronic pain due to trauma 06/25/2013  . Depressive disorder, not elsewhere classified 11/09/2012  . CAD (coronary artery disease) 02/21/2012  . Lipid disorder 02/21/2012  . Angina pectoris 09/27/2011  . Obstructive sleep apnea 05/20/2010  . HYPERTENSION 04/17/2010  . EMPHYSEMA 04/17/2010  . WEIGHT GAIN, ABNORMAL 04/17/2010   Past Medical History  Diagnosis Date  . Unspecified essential hypertension   . OSA (obstructive sleep apnea)   . Morbid obesity (HCC)   . Depression   . Anxiety   . Allergy   . Arthritis   . COPD (chronic obstructive pulmonary disease) (HCC)   . Emphysema of lung [facility])    Past Surgical History  Procedure Laterality Date  . Tonsillectomy    . Leg surgery     Allergies  Allergen Reactions  . Penicillins     REACTION: anaphylaxis  . Valsartan     REACTION: itch/swelling   Prior to Admission medications   Medication Sig Start Date End Date Taking? Authorizing Provider  Armodafinil (NUVIGIL) 150 MG tablet Take 150 mg by mouth daily.    Historical Provider, MD  aspirin  81 MG tablet Take 1 tablet (81 mg total) by mouth daily. 11/13/12   [name], NP  b complex vitamins tablet Take 1 tablet by mouth daily.    Historical Provider, MD  calcium gluconate 500 MG tablet Take 500 mg by mouth daily.    Historical Provider, MD  Cholecalciferol (VITAMIN D) 2000 UNITS tablet Take 1 tablet (2,000 Units total) by mouth daily. 11/13/12   [name], NP  famotidine (PEPCID) 20 MG tablet Take 20 mg by mouth 2 (two) times daily as needed for heartburn.     Historical Provider, MD  gabapentin (NEURONTIN) 300 MG capsule TAKE 3 BY MOUTH THREE TIMES DAILY 06/02/15   Shade Flood, MD  hydrOXYzine (ATARAX/VISTARIL) 50 MG tablet Take 2 tab at bed time 06/19/15   Cleotis Nipper, MD  losartan-hydrochlorothiazide (HYZAAR) 50-12.5 MG tablet TAKE 1 BY MOUTH DAILY 06/02/15   Shade Flood, MD  Lutein 40 MG CAPS Take 1 capsule by mouth daily.    Historical Provider, MD  magnesium gluconate (MAGONATE) 500 MG tablet Take 500 mg by mouth daily.    Historical Provider, MD  Multiple Vitamin (MULTIVITAMIN) capsule Take 1 capsule by mouth daily. 11/13/12   [name], NP  nitroGLYCERIN (NITROSTAT) 0.4 MG SL tablet Place 1 tablet (0.4 mg total) under the tongue every 5 (five) minutes as needed  for chest pain. If chest pain not resolved, after 3 doses 5 minutes apart--call 911 11/13/12   [name], NP  omeprazole (PRILOSEC) 20 MG capsule Take 20 mg by mouth daily.    Historical Provider, MD  Potassium 99 MG TABS Take 1 tablet by mouth daily.    Historical Provider, MD  sertraline (ZOLOFT) 100 MG tablet Take 1 tablet (100 mg total) by mouth daily. 06/19/15   Cleotis Nipper, MD  Shark Cartilage 740 MG CAPS Take 1 capsule by mouth 3 (three) times daily.    Historical Provider, MD  simvastatin (ZOCOR) 20 MG tablet TAKE 1 BY MOUTH AT BEDTIME 04/30/15   Elvina Sidle, MD  traMADol (ULTRAM) 50 MG tablet Take 50 mg by mouth every 6 (six) hours as needed for pain.    Historical Provider, MD    traZODone (DESYREL) 100 MG tablet Take 1 tablet (100 mg total) by mouth at bedtime. 06/19/15   Cleotis Nipper, MD  triamcinolone cream (KENALOG) 0.1 % Apply 1 application topically 2 (two) times daily. 11/29/13   Elvina Sidle, MD   Social History   Social History  . Marital Status: Married    Spouse Name: N/A  . Number of Children: N/A  . 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 Smoking status: Former Smoker -- 2.00 packs/day for 26 years    Types: Cigarettes    Quit date: 08/13/2003  . Smokeless tobacco: Never Used     Comment: 2ppd x 26 years  . Alcohol Use: No  . Drug Use: No  . Sexual Activity: Yes   Other Topics Concern  . Not on file   Social History Narrative   Married. Education: college. Exercise: No.   Review of Systems  Constitutional: Negative for fever, chills and fatigue.  Respiratory: Negative for cough and shortness of breath.   Gastrointestinal: Positive for abdominal pain. Negative for nausea, vomiting, diarrhea and constipation.       Objective:   Physical Exam  Constitutional: He is oriented to person, place, and time. He appears well-developed and well-nourished. No distress.  HENT:  Head: Normocephalic and atraumatic.  Eyes: EOM are normal. Pupils are equal, round, and reactive to light.  Neck: Neck supple.  Cardiovascular: Normal rate.   Pulmonary/Chest: Effort normal. No respiratory distress.  Abdominal:  Easily reducible small umbilical hernia with no surrounding erythema or swelling. Also easily reduced on supine exam  Musculoskeletal: Normal range of motion.  Neurological: He is alert and oriented to person, place, and time.  Skin: Skin is warm and dry.  Psychiatric: He has a normal mood and affect. His behavior is normal.  Nursing note and vitals reviewed.   Filed Vitals:   06/30/15 1204  BP: 127/79  Pulse: 71  Temp: 98 F (36.7 C)  TempSrc: Oral  Resp: 16  Height: 6'  2.5" (1.892 m)  Weight: 353 lb 6.4 oz (160.301 kg)  SpO2: 93%      Assessment & Plan:  Matthew Hutchinson is a 54 y.o. male Umbilical hernia without obstruction and without gangrene - Plan: Ambulatory referral to General Surgery Umbilical hernia, without concerning findings. Will refer to general surgery, hernia precautions discussed, avoid straining as able. Rtc/er precautions.   No orders of the defined types were placed in this encounter.   Patient Instructions  I will refer you for surgeon to evaluate the hernia. See precautions below. Call your  cardiologist for appointment as you may need cardiologist to clear you if surgery is planned.  Return to the clinic or go to the nearest emergency room if any of your symptoms worsen or new symptoms occur.  Hernia, Adult A hernia is the bulging of an organ or tissue through a weak spot in the muscles of the abdomen (abdominal wall). Hernias develop most often near the navel or groin. There are many kinds of hernias. Common kinds include:  Femoral hernia. This kind of hernia develops under the groin in the upper thigh area.  Inguinal hernia. This kind of hernia develops in the groin or scrotum.  Umbilical hernia. This kind of hernia develops near the navel.  Hiatal hernia. This kind of hernia causes part of the stomach to be pushed up into the chest.  Incisional hernia. This kind of hernia bulges through a scar from an abdominal surgery. CAUSES This condition may be caused by:  Heavy lifting.  Coughing over a long period of time.  Straining to have a bowel movement.  An incision made during an abdominal surgery.  A birth defect (congenital defect).  Excess weight or obesity.  Smoking.  Poor nutrition.  Cystic fibrosis.  Excess fluid in the abdomen.  Undescended testicles. SYMPTOMS Symptoms of a hernia include:  A lump on the abdomen. This is the first sign of a hernia. The lump may become more obvious with  standing, straining, or coughing. It may get bigger over time if it is not treated or if the condition causing it is not treated.  Pain. A hernia is usually painless, but it may become painful over time if treatment is delayed. The pain is usually dull and may get worse with standing or lifting heavy objects. Sometimes a hernia gets tightly squeezed in the weak spot (strangulated) or stuck there (incarcerated) and causes additional symptoms. These symptoms may include:  Vomiting.  Nausea.  Constipation.  Irritability. DIAGNOSIS A hernia may be diagnosed with:  A physical exam. During the exam your health care provider may ask you to cough or to make a specific movement, because a hernia is usually more visible when you move.  Imaging tests. These can include:  X-rays.  Ultrasound.  CT scan. TREATMENT A hernia that is small and painless may not need to be treated. A hernia that is large or painful may be treated with surgery. Inguinal hernias may be treated with surgery to prevent incarceration or strangulation. Strangulated hernias are always treated with surgery, because lack of blood to the trapped organ or tissue can cause it to die. Surgery to treat a hernia involves pushing the bulge back into place and repairing the weak part of the abdomen. HOME CARE INSTRUCTIONS  Avoid straining.  Do not lift anything heavier than 10 lb (4.5 kg).  Lift with your leg muscles, not your back muscles. This helps avoid strain.  When coughing, try to cough gently.  Prevent constipation. Constipation leads to straining with bowel movements, which can make a hernia worse or cause a hernia repair to break down. You can prevent constipation by:  Eating a high-fiber diet that includes plenty of fruits and vegetables.  Drinking enough fluids to keep your urine clear or pale yellow. Aim to drink 6-8 glasses of water per day.  Using a stool softener as directed by your health care  provider.  Lose weight, if you are overweight.  Do not use any tobacco products, including cigarettes, chewing tobacco, or electronic cigarettes. If you need  help quitting, ask your health care provider.  Keep all follow-up visits as directed by your health care provider. This is important. Your health care provider may need to monitor your condition. SEEK MEDICAL CARE IF:  You have swelling, redness, and pain in the affected area.  Your bowel habits change. SEEK IMMEDIATE MEDICAL CARE IF:  You have a fever.  You have abdominal pain that is getting worse.  You feel nauseous or you vomit.  You cannot push the hernia back in place by gently pressing on it while you are lying down.  The hernia:  Changes in shape or size.  Is stuck outside the abdomen.  Becomes discolored.  Feels hard or tender.   This information is not intended to replace advice given to you by your health care provider. Make sure you discuss any questions you have with your health care provider.   Document Released: 06/28/2005 Document Revised: 07/19/2014 Document Reviewed: 05/08/2014 Elsevier Interactive Patient Education [employer]        By signing my name below, I, Stann Ore, attest that this documentation has been prepared under the direction and in the presence of [name], MD. Electronically Signed: Stann Ore, Scribe. 06/30/2015 , 12:23 PM .  I personally performed the services described in this documentation, which was scribed in my presence. The recorded information has been reviewed and considered, and addended by me as needed.

## 2015-07-02 ENCOUNTER — Telehealth (HOSPITAL_COMMUNITY): Payer: Self-pay

## 2015-07-02 NOTE — Telephone Encounter (Signed)
Medication management - Telephone call with pt. after he left a message concerned [facility] was awaiting us to send them something so his Hydroxyzine would be shipped.  Informed called them and they received forms and mailed out meds today.   Patient to call back if does not receive Hydroxyzine over the coming week.

## 2015-07-22 ENCOUNTER — Other Ambulatory Visit: Payer: Self-pay | Admitting: Surgery

## 2015-08-12 ENCOUNTER — Ambulatory Visit (INDEPENDENT_AMBULATORY_CARE_PROVIDER_SITE_OTHER): Payer: BLUE CROSS/BLUE SHIELD | Admitting: Cardiovascular Disease

## 2015-08-12 ENCOUNTER — Encounter: Payer: Self-pay | Admitting: Cardiovascular Disease

## 2015-08-12 VITALS — BP 166/88 | HR 100 | Ht 74.0 in | Wt 354.0 lb

## 2015-08-12 DIAGNOSIS — G4733 Obstructive sleep apnea (adult) (pediatric): Secondary | ICD-10-CM | POA: Diagnosis not present

## 2015-08-12 DIAGNOSIS — I10 Essential (primary) hypertension: Secondary | ICD-10-CM | POA: Diagnosis not present

## 2015-08-12 DIAGNOSIS — I2510 Atherosclerotic heart disease of native coronary artery without angina pectoris: Secondary | ICD-10-CM | POA: Diagnosis not present

## 2015-08-12 DIAGNOSIS — I2583 Coronary atherosclerosis due to lipid rich plaque: Secondary | ICD-10-CM

## 2015-08-12 DIAGNOSIS — E789 Disorder of lipoprotein metabolism, unspecified: Secondary | ICD-10-CM | POA: Diagnosis not present

## 2015-08-12 NOTE — Assessment & Plan Note (Signed)
History of hyperlipidemia on simvastatin with his most recent lipid profile performed 02/10/15 revealed a total cholesterol 147, LDL 67 and HDL of 49.

## 2015-08-12 NOTE — Assessment & Plan Note (Signed)
History of minimal CAD cath performed by Dr. Jenne Campus 10/28/04. He had low normal LV function. He denies chest pain or shortness of breath.

## 2015-08-12 NOTE — Assessment & Plan Note (Signed)
History of hypertension with blood pressure measured today at 127/79. He is on losartan and hydrochlorothiazide. Continue current meds at current dosing

## 2015-08-12 NOTE — Progress Notes (Signed)
08/12/2015 Matthew [facility]   July 22, 1960  [id number]  Primary Physician Matthew Flood, MD Primary Cardiologist: Runell Gess MD Roseanne Reno   HPI:  [name] is a 55 year old severely overweight married Caucasian male with no children who owns several businesses as and was referred for cardiovascular clearance because of an elective umbilical hernia repair scheduled in the near future. His risk factors include hypertension, hyperlipidemia. He has obstructive sleep apnea and see. He's had a negative cath/19/06 by Dr. Jenne Campus. He denies chest pain or shortness of breath.   Current Outpatient Prescriptions  Medication Sig Dispense Refill  . Armodafinil (NUVIGIL) 150 MG tablet Take 150 mg by mouth daily. Reported on 06/30/2015    . aspirin 81 MG tablet Take 1 tablet (81 mg total) by mouth daily. 30 tablet 0  . b complex vitamins tablet Take 1 tablet by mouth daily.    . calcium gluconate 500 MG tablet Take 500 mg by mouth daily.    . Cholecalciferol (VITAMIN D) 2000 UNITS tablet Take 1 tablet (2,000 Units total) by mouth daily. 30 tablet 0  . famotidine (PEPCID) 20 MG tablet Take 20 mg by mouth 2 (two) times daily as needed for heartburn.     . gabapentin (NEURONTIN) 300 MG capsule TAKE 3 BY MOUTH THREE TIMES DAILY 810 capsule 1  . hydrOXYzine (ATARAX/VISTARIL) 50 MG tablet Take 2 tab at bed time 180 tablet 0  . losartan-hydrochlorothiazide (HYZAAR) 50-12.5 MG tablet TAKE 1 BY MOUTH DAILY 90 tablet 1  . Lutein 40 MG CAPS Take 1 capsule by mouth daily.    . magnesium gluconate (MAGONATE) 500 MG tablet Take 500 mg by mouth daily.    . Multiple Vitamin (MULTIVITAMIN) capsule Take 1 capsule by mouth daily. 30 capsule 0  . nitroGLYCERIN (NITROSTAT) 0.4 MG SL tablet Place 1 tablet (0.4 mg total) under the tongue every 5 (five) minutes as needed for chest pain. If chest pain not resolved, after 3 doses 5 minutes apart--call 911 30 tablet 0  . omeprazole (PRILOSEC)  20 MG capsule Take 20 mg by mouth daily.    . Potassium 99 MG TABS Take 1 tablet by mouth daily.    . sertraline (ZOLOFT) 100 MG tablet Take 1 tablet (100 mg total) by mouth daily. 90 tablet 0  . Shark Cartilage 740 MG CAPS Take 1 capsule by mouth 3 (three) times daily.    . simvastatin (ZOCOR) 20 MG tablet TAKE 1 BY MOUTH AT BEDTIME 90 tablet 0  . traMADol (ULTRAM) 50 MG tablet Take 50 mg by mouth every 6 (six) hours as needed for pain.    . traZODone (DESYREL) 100 MG tablet Take 1 tablet (100 mg total) by mouth at bedtime. 90 tablet 0  . triamcinolone cream (KENALOG) 0.1 % Apply 1 application topically 2 (two) times daily. 85.2 g 6   No current facility-administered medications for this visit.    Allergies  Allergen Reactions  . Penicillins     REACTION: anaphylaxis  . Valsartan     REACTION: itch/swelling    Social History   Social History  . Marital Status: Married    Spouse Name: N/A  . Number of Children: N/A  . 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 Smoking status: Former Smoker -- 2.00 packs/day for 26 years    Types: Cigarettes    Quit date: 08/13/2003  . Smokeless tobacco: Never Used  Comment: 2ppd x 26 years  . Alcohol Use: No  . Drug Use: No  . Sexual Activity: Yes   Other Topics Concern  . Not on file   Social History Narrative   Married. Education: college. Exercise: No.     Review of Systems: General: negative for chills, fever, night sweats or weight changes.  Cardiovascular: negative for chest pain, dyspnea on exertion, edema, orthopnea, palpitations, paroxysmal nocturnal dyspnea or shortness of breath Dermatological: negative for rash Respiratory: negative for cough or wheezing Urologic: negative for hematuria Abdominal: negative for nausea, vomiting, diarrhea, bright red blood per rectum, melena, or hematemesis Neurologic: negative for visual changes, syncope, or dizziness All  other systems reviewed and are otherwise negative except as noted above.    Blood pressure 166/88, pulse 100, height  (1.88 m), weight 354 lb (160.573 kg).  General appearance: alert and no distress Neck: no adenopathy, no carotid bruit, no JVD, supple, symmetrical, trachea midline and thyroid not enlarged, symmetric, no tenderness/mass/nodules Lungs: clear to auscultation bilaterally Heart: regular rate and rhythm, S1, S2 normal, no murmur, click, rub or gallop Extremities: extremities normal, atraumatic, no cyanosis or edema  EKG not performed today  ASSESSMENT AND PLAN:   Obstructive sleep apnea History of obstructive sleep apnea on C Pap which he benefits from  Essential hypertension History of hypertension with blood pressure measured today at 127/79. He is on losartan and hydrochlorothiazide. Continue current meds at current dosing  Lipid disorder History of hyperlipidemia on simvastatin with his most recent lipid profile performed 02/10/15 revealed a total cholesterol 147, LDL 67 and HDL of 49.  CAD (coronary artery disease) History of minimal CAD cath performed by Dr. Jenne Campus 10/28/04. He had low normal LV function. He denies chest pain or shortness of breath.      Runell Gess MD FACP,FACC,FAHA, [facility] 08/12/2015 9:46 AM

## 2015-08-12 NOTE — Assessment & Plan Note (Signed)
History of obstructive sleep apnea on C Pap which he benefits from 

## 2015-08-12 NOTE — Patient Instructions (Signed)
Medication Instructions:  Your physician recommends that you continue on your current medications as directed. Please refer to the Current Medication list given to you today.   Labwork: none  Testing/Procedures: none  Follow-Up: Follow up with Dr. Berry as needed.   Any Other Special Instructions Will Be Listed Below (If Applicable).     If you need a refill on your cardiac medications before your next appointment, please call your pharmacy.   

## 2015-08-14 ENCOUNTER — Telehealth: Payer: Self-pay

## 2015-08-14 NOTE — Telephone Encounter (Signed)
Matthew [facility]inson - Pt needs his handicapped placard paper work filled out.  I have left this in the nurses box.  Call him at [phone] to pick up

## 2015-08-16 NOTE — Telephone Encounter (Signed)
Dr. Neva Seat form is in your box

## 2015-08-18 NOTE — Telephone Encounter (Signed)
Completed. Ready for pick up

## 2015-08-19 ENCOUNTER — Telehealth: Payer: Self-pay | Admitting: Cardiovascular Disease

## 2015-08-19 NOTE — Telephone Encounter (Signed)
Lm paperwork ready for pick up

## 2015-08-19 NOTE — Telephone Encounter (Signed)
New message      Request for surgical clearance:  What type of surgery is being performed? Umbilical hernia repair 1. When is this surgery scheduled? Pending surgery  2. Are there any medications that need to be held prior to surgery and how long? no  3. Name of physician performing surgery? Dr Rayburn Ma  4. What is your office phone and fax number?  Fax [phone] ----ok to send note thru epic 5. Pt was seen recently but it is not indicated in the office note that pt is cleared for surgery.

## 2015-08-19 NOTE — Telephone Encounter (Signed)
Needs surgical clearance for Umbiilcal Hernia repair   What type of surgery is being performed? Umbilical hernia repair 1. When is this surgery scheduled? Pending surgery  2. Are there any medications that need to be held prior to surgery and how long? no  3. Name of physician performing surgery? Dr Rayburn Ma  4. What is your office phone and fax number? Fax [phone] ----ok to send note thru epic 5. Pt was seen recently but it is not indicated in the office note that pt is cleared for surgery.

## 2015-08-19 NOTE — Telephone Encounter (Signed)
Cleared for surg at low risk 

## 2015-08-20 NOTE — Telephone Encounter (Signed)
Routed note via epic.

## 2015-08-20 NOTE — Telephone Encounter (Signed)
Routed to NL traiage.  Not able to initiate letter to have signed at [facility]

## 2015-09-12 ENCOUNTER — Other Ambulatory Visit: Payer: Self-pay | Admitting: Family Medicine

## 2015-09-12 ENCOUNTER — Encounter (HOSPITAL_BASED_OUTPATIENT_CLINIC_OR_DEPARTMENT_OTHER): Payer: Self-pay | Admitting: *Deleted

## 2015-09-12 NOTE — Progress Notes (Signed)
Patient coming in for BMET and anesthesia consult for BMI 45.43. Has OSA and uses CPAP nightly.

## 2015-09-17 ENCOUNTER — Encounter (HOSPITAL_BASED_OUTPATIENT_CLINIC_OR_DEPARTMENT_OTHER)
Admission: RE | Admit: 2015-09-17 | Discharge: 2015-09-17 | Disposition: A | Discharge status: Home / Self Care | Payer: BLUE CROSS/BLUE SHIELD | Source: Ambulatory Visit | Attending: Surgery | Admitting: Surgery

## 2015-09-17 ENCOUNTER — Encounter (HOSPITAL_COMMUNITY): Payer: Self-pay | Admitting: Psychiatry

## 2015-09-17 ENCOUNTER — Ambulatory Visit (INDEPENDENT_AMBULATORY_CARE_PROVIDER_SITE_OTHER): Payer: BLUE CROSS/BLUE SHIELD | Admitting: Psychiatry

## 2015-09-17 VITALS — BP 126/80 | HR 88 | Ht 74.5 in | Wt 358.5 lb

## 2015-09-17 DIAGNOSIS — Z87891 Personal history of nicotine dependence: Secondary | ICD-10-CM | POA: Diagnosis not present

## 2015-09-17 DIAGNOSIS — F330 Major depressive disorder, recurrent, mild: Secondary | ICD-10-CM

## 2015-09-17 DIAGNOSIS — Z6841 Body Mass Index (BMI) 40.0 and over, adult: Secondary | ICD-10-CM | POA: Diagnosis not present

## 2015-09-17 DIAGNOSIS — I10 Essential (primary) hypertension: Secondary | ICD-10-CM | POA: Diagnosis not present

## 2015-09-17 DIAGNOSIS — J449 Chronic obstructive pulmonary disease, unspecified: Secondary | ICD-10-CM | POA: Diagnosis not present

## 2015-09-17 DIAGNOSIS — K429 Umbilical hernia without obstruction or gangrene: Secondary | ICD-10-CM | POA: Diagnosis present

## 2015-09-17 LAB — BASIC METABOLIC PANEL
Anion gap: 10 (ref 5–15)
BUN: 7 mg/dL (ref 6–20)
CALCIUM: 9.4 mg/dL (ref 8.9–10.3)
CO2: 23 mmol/L (ref 22–32)
CREATININE: 1.01 mg/dL (ref 0.61–1.24)
Chloride: 105 mmol/L (ref 101–111)
GFR calc Af Amer: >60 mL/min (ref >60)
GLUCOSE: 150 mg/dL — AB (ref 65–99)
POTASSIUM: 4.1 mmol/L (ref 3.5–5.1)
SODIUM: 138 mmol/L (ref 135–145)

## 2015-09-17 MED ORDER — TRAZODONE HCL 100 MG PO TABS: 100.0000 mg | ORAL_TABLET | Freq: Every day | ORAL | Status: DC

## 2015-09-17 MED ORDER — HYDROXYZINE HCL 50 MG PO TABS: ORAL_TABLET | ORAL | Status: DC

## 2015-09-17 MED ORDER — SERTRALINE HCL 100 MG PO TABS: 100.0000 mg | ORAL_TABLET | Freq: Every day | ORAL | Status: DC

## 2015-09-17 NOTE — Progress Notes (Signed)
Matthew Hutchinson [id number] Progress Note  Matthew Hutchinson [id number] 55 y.o.  09/17/2015 3:54 PM  Chief Complaint:  Medication management and follow-up.  History of Present Illness:  Matthew Hutchinson came for his follow-up appointment.  He is a schedule for a umbilical hernia surgery tomorrow.  He is anxious but he is also ready for the surgery.  He is taking his psychiatric medication and denies any side effects.  He get some time frustrated and irritable due to [place] situation [place].  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  He is relieved that his partner is more cooperative.  Patient denies any side effects.  He sleeping good.  He is taking hydroxyzine, trazodone and Zoloft.  He has no tremors shakes or any EPS.  His energy level is good.  He denies any major panic attack.  Denies any paranoia, hallucination or any suicidal thoughts.  His vitals are stable.  Suicidal Ideation: No Plan Formed: No Patient has means to carry out plan: No  Homicidal Ideation: No Plan Formed: No Patient has means to carry out plan: No  Medical History; Patient has hypertension, obesity, angina, coronary artery disease, hyperlipidemia and obstructive sleep apnea. His primary care physician is Dr. [name]. Patient has no history of seizures.   Past Psychiatric History/Hospitalization(s) Patient was admitted to behavioral [facility] after taken overdose on Ambien. He has history of taking overdose on Ambien 2 other times but does not require psychiatric inpatient treatment. He admitted history of bad temper mood swing severe anger rage and poor sleep. He endorse history of manic-like symptoms when he gets very impulsive and aggressive. He has history of getting speeding tickets. He denies any psychosis or any hallucination. He was never tried any psychotropic medication other than Xanax and Ambien by his primary care physician Anxiety: Yes Bipolar Disorder: No Depression:  No Mania: Yes Psychosis: No Schizophrenia: No Personality Disorder: No Hospitalization for psychiatric illness: Yes History of Electroconvulsive Shock Therapy: No Prior Suicide Attempts: Yes   Review of Systems: Psychiatric: Agitation: No Hallucination: No Depressed Mood: No Insomnia: No Hypersomnia: No Altered Concentration: No Feels Worthless: No Grandiose Ideas: No Belief In Special Powers: No New/Increased Substance Abuse: No Compulsions: No  Neurologic: Headache: No Seizure: No Paresthesias: No  Outpatient Encounter Prescriptions as of 09/17/2015  Medication Sig  . aspirin 81 MG tablet Take 1 tablet (81 mg total) by mouth daily.  Marland Kitchen b complex vitamins tablet Take 1 tablet by mouth daily.  . calcium gluconate 500 MG tablet Take 500 mg by mouth daily.  . Cholecalciferol (VITAMIN D) 2000 UNITS tablet Take 1 tablet (2,000 Units total) by mouth daily.  . famotidine (PEPCID) 20 MG tablet Take 20 mg by mouth 2 (two) times daily as needed for heartburn.   . gabapentin (NEURONTIN) 300 MG capsule TAKE 3 BY MOUTH THREE TIMES DAILY (Patient taking differently: 900 mg 3 (three) times daily. TAKE 3 BY MOUTH THREE TIMES DAILY)  . hydrOXYzine (ATARAX/VISTARIL) 50 MG tablet Take 2 tab at bed time  . losartan-hydrochlorothiazide (HYZAAR) 50-12.5 MG tablet TAKE 1 BY MOUTH DAILY  . Lutein 40 MG CAPS Take 1 capsule by mouth daily.  . magnesium gluconate (MAGONATE) 500 MG tablet Take 500 mg by mouth daily.  . Multiple Vitamin (MULTIVITAMIN) capsule Take 1 capsule by mouth daily.  . nitroGLYCERIN (NITROSTAT) 0.4 MG SL tablet Place 1 tablet (0.4 mg total) under the tongue every 5 (five) minutes as needed for chest pain. If chest pain not  resolved, after 3 doses 5 minutes apart--call 911  . omeprazole (PRILOSEC) 20 MG capsule Take 20 mg by mouth daily.  . Potassium 99 MG TABS Take 1 tablet by mouth daily.  . sertraline (ZOLOFT) 100 MG tablet Take 1 tablet (100 mg total) by mouth daily.  [name].  Shark Cartilage 740 MG CAPS Take 1 capsule by mouth 3 (three) times daily.  . simvastatin (ZOCOR) 20 MG tablet TAKE 1 BY MOUTH AT BEDTIME  . traMADol (ULTRAM) 50 MG tablet Take 50 mg by mouth every 6 (six) hours as needed for pain.  . traZODone (DESYREL) 100 MG tablet Take 1 tablet (100 mg total) by mouth at bedtime.  . [DISCONTINUED] hydrOXYzine (ATARAX/VISTARIL) 50 MG tablet Take 2 tab at bed time  . [DISCONTINUED] sertraline (ZOLOFT) 100 MG tablet Take 1 tablet (100 mg total) by mouth daily.  . [DISCONTINUED] traZODone (DESYREL) 100 MG tablet Take 1 tablet (100 mg total) by mouth at bedtime.   No facility-administered encounter medications on file as of 09/17/2015.    No results found for this or any previous visit (from the past 2160 hour(s)).  Physical Exam: Consitutional ;  BP 126/80 mmHg  Pulse 88  Ht 6' 2.5" (1.892 m)  Wt 358 lb 8 oz (162.615 kg)  BMI 45.43 kg/m2  Musculoskeletal: Strength & Muscle Tone: within normal limits Gait & Station: normal Patient leans: N/A   Psychiatric Specialty Exam: General Appearance: Well nourished and obese  Eye Contact::  Fair  Speech:  high pitch with increased tone  Volume:  Increased  Mood:  Anxious  Affect:  Congruent  Thought Process:  Goal Directed  Orientation:  Full (Time, Place, and Person)  Thought Content:  WDL  Suicidal Thoughts:  No  Homicidal Thoughts:  No  Memory:  Immediate;   Good Recent;   Good Remote;   Good  Judgement:  Good  Insight:  Good  Psychomotor Activity:  EPS  Concentration:  Fair  Recall:  Fair  Fund of Knowledge:  Good  Language:  Good  Akathisia:  No  Handed:  Right  AIMS (if indicated):     Assets:  Communication Skills Desire for Improvement Financial Resources/Insurance Housing Physical [facility] Social Support  ADL's:  Intact  Cognition:  WNL  Sleep:        Established Problem, Stable/Improving (1), Review or order clinical lab tests (1), Review of Last Therapy Session (1) and  Review of Medication Regimen & Side Effects (2)  Assessment: Major depressive disorder, recurrent  Axis III:  Past Medical History  Diagnosis Date  . Unspecified essential hypertension   . Morbid obesity (HCC)   . Depression   . Anxiety   . Allergy   . Arthritis   . COPD (chronic obstructive pulmonary disease) (HCC)   . Emphysema of lung (HCC)   . Hyperlipidemia   . OSA (obstructive sleep apnea)     uses CPAP nightly  . PONV (postoperative nausea and vomiting)     Plan:  Patient is stable on Zoloft 100 mg daily, trazodone 100 mg at bedtime and Vistaril 100 mg at bedtime.  He has no side effects.  He is anxious but also ready for his upcoming hernia surgery.  Discussed medication side effects and benefits.  Recommended to call us back if he has any question or any concern.  Follow-up in 3 months.   Brinton Brandel T., MD 09/17/2015

## 2015-09-17 NOTE — H&P (Signed)
Matthew Hutchinson. Vista Lawman  Location: [facility] Patient #: [id number] DOB: 23-Dec-1960 Married / Language: English / Race: White Male   History of Present Illness [name] Ivan MD; Patient words: hernia.  The patient is a 55 year old male who presents with an umbilical hernia. This gentleman was referred by Dr. [name] for evaluation of a symptomatic umbilical hernia. He noticed this several weeks ago. He has focal discomfort which is mild to moderate the umbilicus and described as a dull ache without nausea or vomiting or obstructive symptoms. He is otherwise without complaints.   Other Problems ([name], CMA Anxiety Disorder Back Pain Chronic Obstructive Lung Disease Depression High blood pressure Hypercholesterolemia Kidney Stone Sleep Apnea Umbilical Hernia Repair  Past Surgical History [name], CMA; Oral Surgery Shoulder Surgery Left. Tonsillectomy  Diagnostic Studies History [name], CMA; Colonoscopy 1-5 years ago  Allergies ([name], CMA;  PCN Valsartan *ANTIHYPERTENSIVES*  Medication History  Gabapentin (  Capsule, Oral) Active. HydrOXYzine Pamoate (  Capsule, Oral) Active. Losartan Potassium-HCTZ (50-12.5MG  Tablet, Oral) Active. Simvastatin (  Tablet, Oral) Active. Sertraline HCl (  Tablet, Oral) Active. TraZODone HCl (  Tablet, Oral) Active. TraMADol HCl (  Tablet, Oral) Active. Potassium (  Tablet, Oral) Active. Magnesium (  Tablet, Oral) Active. HydrOXYzine HCl (  Tablet, Oral) Active. Lutein (  Capsule, Oral) Active. Famotidine (  Tablet, Oral) Active. Armodafinil (  Tablet, Oral) Active. Medications Reconciled  Social History ([name], Alcohol use Occasional alcohol use. Caffeine use Carbonated beverages, Coffee. Illicit drug use Remotely quit drug use. Tobacco use Former smoker.  Family History [name], CMA;  Alcohol  Abuse Father, Mother. Arthritis Family Members In General, Mother. Breast Cancer Mother. Cancer Family Members In General, Father. Depression Brother. Hypertension Brother, Father. Melanoma Family Members In General, Mother. Ovarian Cancer Mother. Respiratory Condition Family Members In General.    Review of Systems [name] CMA;  Skin Present- Dryness and Rash. Not Present- Change in Wart/Mole, Hives, Jaundice, New Lesions, Non-Healing Wounds and Ulcer. HEENT Present- Nose Bleed, Ringing in the Ears and Wears glasses/contact lenses. Not Present- Earache, Hearing Loss, Hoarseness, Oral Ulcers, Seasonal Allergies, Sinus Pain, Sore Throat, Visual Disturbances and Yellow Eyes. Cardiovascular Present- Leg Cramps. Not Present- Chest Pain, Difficulty Breathing Lying Down, Palpitations, Rapid Heart Rate, Shortness of Breath and Swelling of Extremities. Gastrointestinal Not Present- Abdominal Pain, Bloating, Bloody Stool, Change in Bowel Habits, Chronic diarrhea, Constipation, Difficulty Swallowing, Excessive gas, Gets full quickly at meals, Hemorrhoids, Indigestion, Nausea, Rectal Pain and Vomiting. Male Genitourinary Not Present- Blood in Urine, Change in Urinary Stream, Frequency, Impotence, Nocturia, Painful Urination, Urgency and Urine Leakage. Musculoskeletal Present- Back Pain, Joint Stiffness, Muscle Pain and Muscle Weakness. Not Present- Joint Pain and Swelling of Extremities. Neurological Present- Numbness, Tingling and Trouble walking. Not Present- Decreased Memory, Fainting, Headaches, Seizures, Tremor and Weakness. Endocrine Present- Heat Intolerance. Not Present- Cold Intolerance, Excessive Hunger, Hair Changes, Hot flashes and New Diabetes. Hematology Not Present- Easy Bruising, Excessive bleeding, Gland problems, HIV and Persistent Infections.  Vitals   Weight: 350 lb Height: 74in Body Surface Area: 2.76 m Body Mass Index: 44.94 kg/m  Temp.: 27F(Temporal)   Pulse: 94 (Regular)  BP: 130/80 (Sitting, Left Arm, Standard)    Physical Exam (Zennie Ayars A. Magnus Ivan MD;  General Mental Status-Alert. General Appearance-Consistent with stated age. Build & Nutrition-Obese. Hydration-Well hydrated. Voice-Normal.  Head and Neck Head-normocephalic, atraumatic with no lesions or palpable masses. Trachea-midline.  Eye Eyeball - Bilateral-Extraocular movements intact. Sclera/Conjunctiva - Bilateral-No scleral icterus.  Chest and Lung Exam Chest and lung  exam reveals -quiet, even and easy respiratory effort with no use of accessory muscles and on auscultation, normal breath sounds, no adventitious sounds and normal vocal resonance. Inspection Chest Wall - Normal. Back - normal.  Cardiovascular Cardiovascular examination reveals -normal heart sounds, regular rate and rhythm with no murmurs and normal pedal pulses bilaterally.  Abdomen Inspection Skin - Scar - no surgical scars. Hernias - Umbilical hernia - Reducible. Inguinal hernia - Left - Reducible. Palpation/Percussion Palpation and Percussion of the abdomen reveal - Soft, Non Tender, No Rebound tenderness, No Rigidity (guarding) and No hepatosplenomegaly. Auscultation Auscultation of the abdomen reveals - Bowel sounds normal.  Neurologic Neurologic evaluation reveals -alert and oriented x 3 with no impairment of recent or remote memory. Mental Status-Normal.  Musculoskeletal Normal Exam - Left-Upper Extremity Strength Normal and Lower Extremity Strength Normal. Normal Exam - Right-Upper Extremity Strength Normal, Lower Extremity Weakness.    Assessment & Plan  UMBILICAL HERNIA (K42.9)  Impression: I discussed the diagnosis with him in detail and he is eager to proceed with hernia repair with mesh. I discussed this procedure with him in detail including the risks. These risks include but are not limited to bleeding, infection, injury to surrounding  structures, recurrent hernia, cardiopulmonary issues, DVT, etc. He already has an appointment on January 31 with his cardiologist to get clearance for surgery. Surgery will be scheduled after that

## 2015-09-17 NOTE — Progress Notes (Signed)
Dr. Ivin Bootyrews evaluated patient airway ok for surgery here

## 2015-09-18 ENCOUNTER — Encounter (HOSPITAL_BASED_OUTPATIENT_CLINIC_OR_DEPARTMENT_OTHER): Payer: Self-pay | Admitting: Anesthesiology

## 2015-09-18 ENCOUNTER — Ambulatory Visit (HOSPITAL_BASED_OUTPATIENT_CLINIC_OR_DEPARTMENT_OTHER)
Admission: RE | Admit: 2015-09-18 | Discharge: 2015-09-18 | Disposition: A | Discharge status: Home / Self Care | Payer: BLUE CROSS/BLUE SHIELD | Source: Ambulatory Visit | Attending: Surgery | Admitting: Surgery

## 2015-09-18 ENCOUNTER — Ambulatory Visit (HOSPITAL_BASED_OUTPATIENT_CLINIC_OR_DEPARTMENT_OTHER): Payer: BLUE CROSS/BLUE SHIELD | Admitting: Anesthesiology

## 2015-09-18 ENCOUNTER — Encounter (HOSPITAL_BASED_OUTPATIENT_CLINIC_OR_DEPARTMENT_OTHER)
Admission: RE | Disposition: A | Discharge status: Home / Self Care | Payer: Self-pay | Source: Ambulatory Visit | Attending: Surgery

## 2015-09-18 DIAGNOSIS — Z6841 Body Mass Index (BMI) 40.0 and over, adult: Secondary | ICD-10-CM | POA: Insufficient documentation

## 2015-09-18 DIAGNOSIS — K429 Umbilical hernia without obstruction or gangrene: Secondary | ICD-10-CM | POA: Insufficient documentation

## 2015-09-18 DIAGNOSIS — J449 Chronic obstructive pulmonary disease, unspecified: Secondary | ICD-10-CM | POA: Insufficient documentation

## 2015-09-18 DIAGNOSIS — Z87891 Personal history of nicotine dependence: Secondary | ICD-10-CM | POA: Insufficient documentation

## 2015-09-18 DIAGNOSIS — I10 Essential (primary) hypertension: Secondary | ICD-10-CM | POA: Insufficient documentation

## 2015-09-18 HISTORY — PX: INSERTION OF MESH: SHX5868

## 2015-09-18 HISTORY — DX: Other specified postprocedural states: Z98.890

## 2015-09-18 HISTORY — PX: UMBILICAL HERNIA REPAIR: SHX196

## 2015-09-18 HISTORY — DX: Nausea with vomiting, unspecified: R11.2

## 2015-09-18 LAB — GLUCOSE, CAPILLARY: Glucose-Capillary: 149 mg/dL — ABNORMAL HIGH (ref 65–99)

## 2015-09-18 SURGERY — REPAIR, HERNIA, UMBILICAL, ADULT
Anesthesia: General | Site: Abdomen

## 2015-09-18 MED ORDER — SUCCINYLCHOLINE CHLORIDE 20 MG/ML IJ SOLN
INTRAMUSCULAR | Status: DC | PRN
  Administered 2015-09-18: 50 mg via INTRAVENOUS

## 2015-09-18 MED ORDER — MIDAZOLAM HCL 2 MG/2ML IJ SOLN
INTRAMUSCULAR | Status: AC
  Filled 2015-09-18: qty 2

## 2015-09-18 MED ORDER — LACTATED RINGERS IV SOLN: INTRAVENOUS | Status: DC

## 2015-09-18 MED ORDER — SCOPOLAMINE 1 MG/3DAYS TD PT72
1.0000 | MEDICATED_PATCH | Freq: Once | TRANSDERMAL | Status: DC | PRN
  Administered 2015-09-18: 1.5 mg via TRANSDERMAL

## 2015-09-18 MED ORDER — MIDAZOLAM HCL 5 MG/5ML IJ SOLN
INTRAMUSCULAR | Status: DC | PRN
  Administered 2015-09-18: 2 mg via INTRAVENOUS

## 2015-09-18 MED ORDER — LIDOCAINE HCL (CARDIAC) 20 MG/ML IV SOLN
INTRAVENOUS | Status: DC | PRN
  Administered 2015-09-18: 100 mg via INTRAVENOUS

## 2015-09-18 MED ORDER — MIDAZOLAM HCL 2 MG/2ML IJ SOLN: 1.0000 mg | INTRAMUSCULAR | Status: DC | PRN

## 2015-09-18 MED ORDER — ONDANSETRON HCL 4 MG/2ML IJ SOLN
INTRAMUSCULAR | Status: AC
  Filled 2015-09-18: qty 2

## 2015-09-18 MED ORDER — SCOPOLAMINE 1 MG/3DAYS TD PT72
MEDICATED_PATCH | TRANSDERMAL | Status: AC
  Filled 2015-09-18: qty 1

## 2015-09-18 MED ORDER — GLYCOPYRROLATE 0.2 MG/ML IJ SOLN: 0.2000 mg | Freq: Once | INTRAMUSCULAR | Status: DC | PRN

## 2015-09-18 MED ORDER — CIPROFLOXACIN IN D5W 400 MG/200ML IV SOLN
INTRAVENOUS | Status: AC
  Filled 2015-09-18: qty 200

## 2015-09-18 MED ORDER — LIDOCAINE HCL (CARDIAC) 20 MG/ML IV SOLN
INTRAVENOUS | Status: AC
  Filled 2015-09-18: qty 5

## 2015-09-18 MED ORDER — DEXAMETHASONE SODIUM PHOSPHATE 10 MG/ML IJ SOLN
INTRAMUSCULAR | Status: AC
  Filled 2015-09-18: qty 1

## 2015-09-18 MED ORDER — FENTANYL CITRATE (PF) 100 MCG/2ML IJ SOLN
INTRAMUSCULAR | Status: DC | PRN
  Administered 2015-09-18: 100 ug via INTRAVENOUS

## 2015-09-18 MED ORDER — FENTANYL CITRATE (PF) 100 MCG/2ML IJ SOLN
INTRAMUSCULAR | Status: AC
  Filled 2015-09-18: qty 2

## 2015-09-18 MED ORDER — BUPIVACAINE-EPINEPHRINE 0.5% -1:200000 IJ SOLN
INTRAMUSCULAR | Status: DC | PRN
  Administered 2015-09-18 (×2): 10 mL

## 2015-09-18 MED ORDER — DEXAMETHASONE SODIUM PHOSPHATE 4 MG/ML IJ SOLN
INTRAMUSCULAR | Status: DC | PRN
  Administered 2015-09-18: 10 mg via INTRAVENOUS

## 2015-09-18 MED ORDER — FENTANYL CITRATE (PF) 100 MCG/2ML IJ SOLN
25.0000 ug | INTRAMUSCULAR | Status: DC | PRN
Start: 2015-09-18 — End: 2015-09-18

## 2015-09-18 MED ORDER — MEPERIDINE HCL 25 MG/ML IJ SOLN: 6.2500 mg | INTRAMUSCULAR | Status: DC | PRN

## 2015-09-18 MED ORDER — BUPIVACAINE-EPINEPHRINE (PF) 0.5% -1:200000 IJ SOLN
INTRAMUSCULAR | Status: AC
  Filled 2015-09-18: qty 60

## 2015-09-18 MED ORDER — CIPROFLOXACIN IN D5W 400 MG/200ML IV SOLN
400.0000 mg | INTRAVENOUS | Status: AC
  Administered 2015-09-18: 400 mg via INTRAVENOUS

## 2015-09-18 MED ORDER — OXYCODONE-ACETAMINOPHEN 5-325 MG PO TABS: 1.0000 | ORAL_TABLET | ORAL | Status: DC | PRN

## 2015-09-18 MED ORDER — PROPOFOL 10 MG/ML IV BOLUS
INTRAVENOUS | Status: AC
  Filled 2015-09-18: qty 40

## 2015-09-18 MED ORDER — PROMETHAZINE HCL 25 MG/ML IJ SOLN: 6.2500 mg | INTRAMUSCULAR | Status: DC | PRN

## 2015-09-18 MED ORDER — LACTATED RINGERS IV SOLN
INTRAVENOUS | Status: DC
  Administered 2015-09-18 (×2): via INTRAVENOUS

## 2015-09-18 MED ORDER — PROPOFOL 10 MG/ML IV BOLUS
INTRAVENOUS | Status: DC | PRN
  Administered 2015-09-18: 250 mg via INTRAVENOUS

## 2015-09-18 MED ORDER — FENTANYL CITRATE (PF) 100 MCG/2ML IJ SOLN: 50.0000 ug | INTRAMUSCULAR | Status: DC | PRN

## 2015-09-18 SURGICAL SUPPLY — 43 items
BLADE CLIPPER SURG (BLADE) IMPLANT
BLADE HEX COATED 2.75 (ELECTRODE) ×2 IMPLANT
BLADE SURG 15 STRL LF DISP TIS (BLADE) ×1 IMPLANT
BLADE SURG 15 STRL SS (BLADE) ×2
CANISTER SUCT 1200ML W/VALVE (MISCELLANEOUS) IMPLANT
CHLORAPREP W/TINT 26ML (MISCELLANEOUS) ×2 IMPLANT
COVER BACK TABLE 60X90IN (DRAPES) ×2 IMPLANT
COVER MAYO STAND STRL (DRAPES) ×2 IMPLANT
DECANTER SPIKE VIAL GLASS SM (MISCELLANEOUS) IMPLANT
DRAPE LAPAROTOMY 100X72 PEDS (DRAPES) ×2 IMPLANT
DRAPE UTILITY XL STRL (DRAPES) ×2 IMPLANT
DRSG TEGADERM 2-3/8X2-3/4 SM (GAUZE/BANDAGES/DRESSINGS) IMPLANT
ELECT REM PT RETURN 9FT ADLT (ELECTROSURGICAL) ×2
ELECTRODE REM PT RTRN 9FT ADLT (ELECTROSURGICAL) ×1 IMPLANT
GLOVE BIOGEL PI IND STRL 7.0 (GLOVE) IMPLANT
GLOVE BIOGEL PI INDICATOR 7.0 (GLOVE) ×2
GLOVE ECLIPSE 6.5 STRL STRAW (GLOVE) ×1 IMPLANT
GLOVE SURG SIGNA 7.5 PF LTX (GLOVE) ×2 IMPLANT
GOWN STRL REUS W/ TWL LRG LVL3 (GOWN DISPOSABLE) ×1 IMPLANT
GOWN STRL REUS W/ TWL XL LVL3 (GOWN DISPOSABLE) ×1 IMPLANT
GOWN STRL REUS W/TWL LRG LVL3 (GOWN DISPOSABLE) ×2
GOWN STRL REUS W/TWL XL LVL3 (GOWN DISPOSABLE) ×2
LIQUID BAND (GAUZE/BANDAGES/DRESSINGS) ×4 IMPLANT
MESH VENTRALEX ST 2.5 CRC MED (Mesh General) ×1 IMPLANT
NDL HYPO 25X1 1.5 SAFETY (NEEDLE) ×1 IMPLANT
NEEDLE HYPO 25X1 1.5 SAFETY (NEEDLE) ×2 IMPLANT
NS IRRIG 1000ML POUR BTL (IV SOLUTION) IMPLANT
PACK BASIN DAY SURGERY FS (CUSTOM PROCEDURE TRAY) ×2 IMPLANT
PENCIL BUTTON HOLSTER BLD 10FT (ELECTRODE) ×2 IMPLANT
SLEEVE SCD COMPRESS KNEE MED (MISCELLANEOUS) ×2 IMPLANT
SPONGE LAP 4X18 X RAY DECT (DISPOSABLE) ×1 IMPLANT
SUT MNCRL AB 4-0 PS2 18 (SUTURE) ×2 IMPLANT
SUT NOVA 0 T19/GS 22DT (SUTURE) IMPLANT
SUT NOVA NAB DX-16 0-1 5-0 T12 (SUTURE) ×1 IMPLANT
SUT VIC AB 2-0 SH 27 (SUTURE)
SUT VIC AB 2-0 SH 27XBRD (SUTURE) IMPLANT
SUT VIC AB 3-0 SH 27 (SUTURE) ×2
SUT VIC AB 3-0 SH 27X BRD (SUTURE) ×1 IMPLANT
SYR CONTROL 10ML LL (SYRINGE) ×2 IMPLANT
TOWEL OR 17X24 6PK STRL BLUE (TOWEL DISPOSABLE) ×2 IMPLANT
TOWEL OR NON WOVEN STRL DISP B (DISPOSABLE) ×2 IMPLANT
TUBE CONNECTING 20X1/4 (TUBING) IMPLANT
YANKAUER SUCT BULB TIP NO VENT (SUCTIONS) IMPLANT

## 2015-09-18 NOTE — Op Note (Signed)
[name], Matthew        ACCOUNT NO.:  [id number]  MEDICAL RECORD NO.:  [id number]  LOCATION:  [facility]                           FACILITY:  [facility]  PHYSICIAN:  Abigail Miyamotoouglas Akyra Bouchie, M.D. DATE OF BIRTH:  [birth year]  DATE OF PROCEDURE:  09/18/2015 DATE OF DISCHARGE:                              OPERATIVE REPORT   PREOPERATIVE DIAGNOSIS:  Umbilical hernia.  POSTOPERATIVE DIAGNOSIS:  Umbilical hernia.  PROCEDURE:  Umbilical hernia repair with mesh.  SURGEON:  Abigail Miyamotoouglas Shai Mckenzie, M.D.  ANESTHESIA:  General and 0.5% Marcaine.  ESTIMATED BLOOD LOSS:  Minimal.  FINDINGS:  The patient was found to have a small fascial defect at the umbilicus, which was about 1.5 cm in size.  It was repaired with a 6.4- cm round Ventralex ST patch from [facility].  PROCEDURE IN DETAIL:  The patient was brought to the operating room, identified as the correct patient.  He was placed supine on the operating room table and general anesthesia was induced.  His abdomen was then prepped and draped in usual sterile fashion.  I anesthetized the skin at the lower edge of the umbilicus with Marcaine and then made a small vertical incision with a scalpel.  I took this down to the hernia sac, which I separated from the overlying umbilical skin.  All contents had been reduced back into the abdominal cavity and I excised the sac in its entirety with the cautery.  The fascial defect was about 1-1.5 cm in size.  I brought a 6.4-cm round ventral patch onto the field.  I placed it through the fascia opening and then pulled it up against the perineum with stay ties.  I then sewed the mesh in circumferentially with interrupted #1 Novafil sutures.  I then cut the stay ties.  I then closed the fascia over the top of the mesh with a figure-of-eight #1 Novafil suture.  I then anesthetized the fascia further with Marcaine.  I then closed the subcutaneous tissue with interrupted 3-0 Vicryl sutures and closed the skin with a running  4-0 Monocryl.  Skin glue was then applied.  The patient tolerated the procedure well.  All the counts were correct at the end of the procedure.  The patient was then extubated in the operating room and taken in a stable condition to the recovery room.     Abigail Miyamotoouglas Soua Lenk, M.D.    DB/MEDQ  D:  09/18/2015  T:  09/18/2015  Job:  [id number]

## 2015-09-18 NOTE — Op Note (Signed)
HERNIA REPAIR UMBILICAL ADULT WITH MESH , INSERTION OF MESH  Procedure Note  Matthew [facility] 09/18/2015   Pre-op Diagnosis: Umbilical hernia      Post-op Diagnosis: same  Procedure(s): HERNIA REPAIR UMBILICAL ADULT WITH MESH  INSERTION OF MESH  Surgeon(s): Abigail Miyamotoouglas Shafer Swamy, MD  Anesthesia: General  Staff:  Circulator: Salley ScarletMary B Davidson, RN Scrub Person: Maryan RuedBrandi C Weaver, RN  Estimated Blood Loss: Minimal                         [name] A   Date: 09/18/2015  Time: 11:12 AM

## 2015-09-18 NOTE — Interval H&P Note (Signed)
History and Physical Interval Note: no change in H and P  09/18/2015 10:12 AM  [place]  has presented today for surgery, with the diagnosis of Umbilical hernia   The various methods of treatment have been discussed with the patient and family. After consideration of risks, benefits and other options for treatment, the patient has consented to  Procedure(s): HERNIA REPAIR UMBILICAL ADULT WITH MESH  (N/A) INSERTION OF MESH (N/A) as a surgical intervention .  The patient's history has been reviewed, patient examined, no change in status, stable for surgery.  I have reviewed the patient's chart and labs.  Questions were answered to the patient's satisfaction.     Wileen Duncanson A

## 2015-09-18 NOTE — Anesthesia Postprocedure Evaluation (Signed)
Anesthesia Post Note  Patient: [place]  Procedure(s) Performed: Procedure(s) (LRB): HERNIA REPAIR UMBILICAL ADULT WITH MESH  (N/A) INSERTION OF MESH (N/A)  Patient location during evaluation: PACU Anesthesia Type: General Level of consciousness: awake and alert Pain management: pain level controlled Vital Signs Assessment: post-procedure vital signs reviewed and stable Respiratory status: spontaneous breathing, nonlabored ventilation, respiratory function stable and patient connected to nasal cannula oxygen Cardiovascular status: blood pressure returned to baseline and stable Postop Assessment: no signs of nausea or vomiting Anesthetic complications: no    Last Vitals:  Filed Vitals:   09/18/15 1200 09/18/15 1242  BP: 122/74 150/86  Pulse: 79 81  Temp:  37 C  Resp: 17 18    Last Pain:  Filed Vitals:   09/18/15 1243  PainSc: 2                  Phillips Groutarignan, Marik Sedore

## 2015-09-18 NOTE — Discharge Instructions (Signed)
[facility] _______[facility] ° °UMBILICAL OR INGUINAL HERNIA REPAIR: POST OP INSTRUCTIONS ° °Always review your discharge instruction sheet given to you by the facility where your surgery was performed. °IF YOU HAVE DISABILITY OR FAMILY LEAVE FORMS, YOU MUST BRING THEM TO THE OFFICE FOR PROCESSING.   °DO NOT GIVE THEM TO YOUR DOCTOR. ° °1. A  prescription for pain medication may be given to you upon discharge.  Take your pain medication as prescribed, if needed.  If narcotic pain medicine is not needed, then you may take acetaminophen (Tylenol) or ibuprofen (Advil) as needed. °2. Take your usually prescribed medications unless otherwise directed. °3. If you need a refill on your pain medication, please contact your pharmacy.  They will contact our office to request authorization. Prescriptions will not be filled after 5 pm or on week-ends. °4. You should follow a light diet the first 24 hours after arrival home, such as soup and crackers, etc.  Be sure to include lots of fluids daily.  Resume your normal diet the day after surgery. °5. Most patients will experience some swelling and bruising around the umbilicus or in the groin and scrotum.  Ice packs and reclining will help.  Swelling and bruising can take several days to resolve.  °6. It is common to experience some constipation if taking pain medication after surgery.  Increasing fluid intake and taking a stool softener (such as Colace) will usually help or prevent this problem from occurring.  A mild laxative (Milk of Magnesia or Miralax) should be taken according to package directions if there are no bowel movements after 48 hours. °7. Unless discharge instructions indicate otherwise, you may remove your bandages 24-48 hours after surgery, and you may shower at that time.  You may have steri-strips (small skin tapes) in place directly over the incision.  These strips should be left on the skin for 7-10 days.  If your surgeon used skin glue on the  incision, you may shower in 24 hours.  The glue will flake off over the next 2-3 weeks.  Any sutures or staples will be removed at the office during your follow-up visit. °8. ACTIVITIES:  You may resume regular (light) daily activities beginning the next day--such as daily self-care, walking, climbing stairs--gradually increasing activities as tolerated.  You may have sexual intercourse when it is comfortable.  Refrain from any heavy lifting or straining until approved by your doctor. °a. You may drive when you are no longer taking prescription pain medication, you can comfortably wear a seatbelt, and you can safely maneuver your car and apply brakes. °b. RETURN TO WORK:  __________________________________________________________ °9. You should see your doctor in the office for a follow-up appointment approximately 2-3 weeks after your surgery.  Make sure that you call for this appointment within a day or two after you arrive home to insure a convenient appointment time. °10. OTHER INSTRUCTIONS: NO LIFTING MORE THAN 15 TO 20 POUNDS FOR 4 WEEKS °11. ICE PACK AND IBUPROFEN ALSO FOR PAIN °12. OK TO SHOWER TOMORROW __________________________________________________________________________________________________________________________________________________________________________________________  °WHEN TO CALL YOUR DOCTOR: °1. Fever over 101.0 °2. Inability to urinate °3. Nausea and/or vomiting °4. Extreme swelling or bruising °5. Continued bleeding from incision. °6. Increased pain, redness, or drainage from the incision ° °The clinic staff is available to answer your questions during regular business hours.  Please don’t hesitate to call and ask to speak to one of the nurses for clinical concerns.  If you have a medical emergency, go to   the nearest emergency room or call 911.  A surgeon from [facility] is always on call at the hospital ° ° °[address], [address], [place], [place]  [address]  ? ° [address] [address], [place], [place]   [address] °[phone] ? [phone] ? FAX [phone] °Web site: [email or web address] ° ° ° °Post Anesthesia Home Care Instructions ° °Activity: °Get plenty of rest for the remainder of the day. A responsible adult should stay with you for 24 hours following the procedure.  °For the next 24 hours, DO NOT: °-Drive a car °-Operate machinery °-Drink alcoholic beverages °-Take any medication unless instructed by your physician °-Make any legal decisions or sign important papers. ° °Meals: °Start with liquid foods such as gelatin or soup. Progress to regular foods as tolerated. Avoid greasy, spicy, heavy foods. If nausea and/or vomiting occur, drink only clear liquids until the nausea and/or vomiting subsides. Call your physician if vomiting continues. ° °Special Instructions/Symptoms: °Your throat may feel dry or sore from the anesthesia or the breathing tube placed in your throat during surgery. If this causes discomfort, gargle with warm salt water. The discomfort should disappear within 24 hours. ° °If you had a scopolamine patch placed behind your ear for the management of post- operative nausea and/or vomiting: ° °1. The medication in the patch is effective for 72 hours, after which it should be removed.  Wrap patch in a tissue and discard in the trash. Wash hands thoroughly with soap and water. °2. You may remove the patch earlier than 72 hours if you experience unpleasant side effects which may include dry mouth, dizziness or visual disturbances. °3. Avoid touching the patch. Wash your hands with soap and water after contact with the patch. °  ° °

## 2015-09-18 NOTE — Anesthesia Preprocedure Evaluation (Signed)
Anesthesia Evaluation  Patient identified by MRN, date of birth, ID band Patient awake    Reviewed: Allergy & Precautions, NPO status , Patient's Chart, lab work & pertinent test results  History of Anesthesia Complications (+) PONV  Airway Mallampati: III  TM Distance: >3 FB Neck ROM: Full    Dental no notable dental hx. (+) Partial Upper, Partial Lower   Pulmonary sleep apnea and Continuous Positive Airway Pressure Ventilation , COPD, former smoker,    Pulmonary exam normal breath sounds clear to auscultation       Cardiovascular hypertension, Pt. on medications Normal cardiovascular exam Rhythm:Regular Rate:Normal     Neuro/Psych negative neurological ROS  negative psych ROS   GI/Hepatic negative GI ROS, Neg liver ROS,   Endo/Other  Morbid obesity  Renal/GU negative Renal ROS  negative genitourinary   Musculoskeletal negative musculoskeletal ROS (+)   Abdominal   Peds negative pediatric ROS (+)  Hematology negative hematology ROS (+)   Anesthesia Other Findings   Reproductive/Obstetrics negative OB ROS                             Anesthesia Physical Anesthesia Plan  ASA: III  Anesthesia Plan: General   Post-op Pain Management:    Induction: Intravenous  Airway Management Planned: LMA  Additional Equipment:   Intra-op Plan:   Post-operative Plan:   Informed Consent: I have reviewed the patients History and Physical, chart, labs and discussed the procedure including the risks, benefits and alternatives for the proposed anesthesia with the patient or authorized representative who has indicated his/her understanding and acceptance.   Dental advisory given  Plan Discussed with: CRNA  Anesthesia Plan Comments:         Anesthesia Quick Evaluation

## 2015-09-18 NOTE — Transfer of Care (Signed)
Immediate Anesthesia Transfer of Care Note  Patient: [place]  Procedure(s) Performed: Procedure(s): HERNIA REPAIR UMBILICAL ADULT WITH MESH  (N/A) INSERTION OF MESH (N/A)  Patient Location: PACU  Anesthesia Type:General  Level of Consciousness: awake and patient cooperative  Airway & Oxygen Therapy: Patient Spontanous Breathing and Patient connected to face mask oxygen  Post-op Assessment: Report given to RN and Post -op Vital signs reviewed and stable  Post vital signs: Reviewed and stable  Last Vitals:  Filed Vitals:   09/18/15 0947  BP: 157/75  Pulse: 84  Temp: 36.8 C  Resp: 20    Complications: No apparent anesthesia complications

## 2015-09-18 NOTE — Anesthesia Procedure Notes (Signed)
Procedure Name: Intubation Date/Time: 09/18/2015 10:40 AM Performed by:  DesanctisLINKA, Ramses Klecka L Pre-anesthesia Checklist: Patient identified, Emergency Drugs available, Suction available, Patient being monitored and Timeout performed Patient Re-evaluated:Patient Re-evaluated prior to inductionOxygen Delivery Method: Circle System Utilized Preoxygenation: Pre-oxygenation with 100% oxygen Intubation Type: IV induction Ventilation: Mask ventilation without difficulty Laryngoscope Size: Miller and 3 Tube type: Oral Tube size: 8.0 mm Number of attempts: 1 Airway Equipment and Method: Stylet and Oral airway Placement Confirmation: ETT inserted through vocal cords under direct vision,  positive ETCO2 and breath sounds checked- equal and bilateral Secured at: 23 cm Tube secured with: Tape Dental Injury: Teeth and Oropharynx as per pre-operative assessment

## 2015-09-19 ENCOUNTER — Encounter (HOSPITAL_BASED_OUTPATIENT_CLINIC_OR_DEPARTMENT_OTHER): Payer: Self-pay | Admitting: Surgery

## 2015-12-01 ENCOUNTER — Ambulatory Visit: Payer: Self-pay | Admitting: Family Medicine

## 2015-12-03 ENCOUNTER — Ambulatory Visit (INDEPENDENT_AMBULATORY_CARE_PROVIDER_SITE_OTHER): Payer: BLUE CROSS/BLUE SHIELD | Admitting: Family Medicine

## 2015-12-03 ENCOUNTER — Telehealth: Payer: Self-pay

## 2015-12-03 ENCOUNTER — Encounter: Payer: Self-pay | Admitting: Family Medicine

## 2015-12-03 VITALS — BP 144/92 | HR 80 | Temp 98.4°F | Resp 16 | Ht 73.0 in | Wt 361.4 lb

## 2015-12-03 DIAGNOSIS — G894 Chronic pain syndrome: Secondary | ICD-10-CM

## 2015-12-03 DIAGNOSIS — I10 Essential (primary) hypertension: Secondary | ICD-10-CM | POA: Diagnosis not present

## 2015-12-03 DIAGNOSIS — G4733 Obstructive sleep apnea (adult) (pediatric): Secondary | ICD-10-CM

## 2015-12-03 DIAGNOSIS — E119 Type 2 diabetes mellitus without complications: Secondary | ICD-10-CM

## 2015-12-03 DIAGNOSIS — Z9989 Dependence on other enabling machines and devices: Secondary | ICD-10-CM

## 2015-12-03 LAB — LIPID PANEL
CHOLESTEROL: 138 mg/dL (ref 125–200)
HDL: 47 mg/dL (ref >=40)
LDL Cholesterol: 67 mg/dL (ref <130)
TRIGLYCERIDES: 120 mg/dL (ref <150)
Total CHOL/HDL Ratio: 2.9 Ratio (ref <=5.0)
VLDL: 24 mg/dL (ref <30)

## 2015-12-03 LAB — COMPLETE METABOLIC PANEL WITH GFR
ALBUMIN: 3.9 g/dL (ref 3.6–5.1)
ALK PHOS: 76 U/L (ref 40–115)
ALT: 26 U/L (ref 9–46)
AST: 23 U/L (ref 10–35)
BILIRUBIN TOTAL: 0.5 mg/dL (ref 0.2–1.2)
BUN: 11 mg/dL (ref 7–25)
CALCIUM: 9.1 mg/dL (ref 8.6–10.3)
CO2: 23 mmol/L (ref 20–31)
Chloride: 107 mmol/L (ref 98–110)
Creat: 0.95 mg/dL (ref 0.70–1.33)
GLUCOSE: 138 mg/dL — AB (ref 65–99)
Potassium: 3.9 mmol/L (ref 3.5–5.3)
Sodium: 139 mmol/L (ref 135–146)
TOTAL PROTEIN: 6.4 g/dL (ref 6.1–8.1)

## 2015-12-03 LAB — POCT GLYCOSYLATED HEMOGLOBIN (HGB A1C): HEMOGLOBIN A1C: 6.7

## 2015-12-03 MED ORDER — METFORMIN HCL 500 MG PO TABS: 500.0000 mg | ORAL_TABLET | Freq: Every day | ORAL | Status: DC

## 2015-12-03 MED ORDER — LOSARTAN POTASSIUM-HCTZ 100-12.5 MG PO TABS: 1.0000 | ORAL_TABLET | Freq: Every day | ORAL | Status: DC

## 2015-12-03 NOTE — Telephone Encounter (Signed)
Spoke with Leigh at Dr. Thomasene LotGreen's office, states that pt currently in their office stating he needs cpap supplies refill.  Pt seen by KC last on 12/2014.  Pt has pending ov with RA on 02/03/16.   RA ok to refill cpap supplies to [facility]?  Thanks!
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
## 2015-12-03 NOTE — Patient Instructions (Addendum)
     IF you received an x-ray today, you will receive an invoice from [facility]. Please contact [facility] at [phone] with questions or concerns regarding your invoice.   IF you received labwork today, you will receive an invoice from [facility]. Please contact [facility] at [phone] with questions or concerns regarding your invoice.   Our billing staff will not be able to assist you with questions regarding bills from these companies.  You will be contacted with the lab results as soon as they are available. The fastest way to get your results is to activate your My Chart account. Instructions are located on the last page of this paperwork. If you have not heard from us regarding the results in 2 weeks, please contact this office.    I will refer you to pain management for your leg to see if other options to allow more activity and weight loss.  Increased dose of blood pressure med. Watch for lightheadedness or other side effects of this dose and if so - return to discuss changes.   Continue to watch diet.   We called pulmonary - they will send in prescription for your supplies.   Recheck in 3 months for blood sugar. Start low dose metformin for now.

## 2015-12-04 NOTE — Telephone Encounter (Signed)
Ok x2 mnths until appt

## 2015-12-04 NOTE — Telephone Encounter (Signed)
[facility]  No number listed to call back Leigh

## 2015-12-12 ENCOUNTER — Other Ambulatory Visit: Payer: Self-pay | Admitting: Family Medicine

## 2015-12-18 ENCOUNTER — Ambulatory Visit (INDEPENDENT_AMBULATORY_CARE_PROVIDER_SITE_OTHER): Payer: BLUE CROSS/BLUE SHIELD | Admitting: Psychiatry

## 2015-12-18 ENCOUNTER — Encounter (HOSPITAL_COMMUNITY): Payer: Self-pay | Admitting: Psychiatry

## 2015-12-18 VITALS — BP 140/80 | HR 97 | Ht 74.5 in | Wt 359.4 lb

## 2015-12-18 DIAGNOSIS — F330 Major depressive disorder, recurrent, mild: Secondary | ICD-10-CM

## 2015-12-18 MED ORDER — HYDROXYZINE HCL 50 MG PO TABS: ORAL_TABLET | ORAL | Status: DC

## 2015-12-18 MED ORDER — TRAZODONE HCL 100 MG PO TABS: 100.0000 mg | ORAL_TABLET | Freq: Every day | ORAL | Status: DC

## 2015-12-18 MED ORDER — SERTRALINE HCL 100 MG PO TABS: 100.0000 mg | ORAL_TABLET | Freq: Every day | ORAL | Status: DC

## 2015-12-18 NOTE — Progress Notes (Signed)
[place] [phone] Progress Note  Matthew Hutchinson [id number] 54 y.o.  12/18/2015 3:23 PM  Chief Complaint:  I am feeling so-so.  I cut down Vistaril.  History of Present Illness:  Matthew Hutchinson came for his follow-up appointment.  He had a hernia surgery in March and he admitted it was okay but took some time to recover.  He's taking Vistaril once a day because he has no leftover remaining.  He has issues with her pharmacy and he was not given last refill .  He admitted sometime feeling frustrated and anxious because business is not going very well but he has no other choice just to wait.  He sleeping okay.  He denies any agitation anger or any mood swing.  He denies any paranoia or any hallucination.  He was prescribed pain medication which he stopped now.  He recently seen his physician at urgent care and his blood work which is okay.  He was found to be prediabetic as his inability A1c is 6.7.  He was recommended to think about starting metformin but he has not decided yet.  He wants to continue trazodone and Zoloft but like to have Vistaril 100 mg at bedtime because he does feel anxious and nervous.  He denies any drinking or using any illegal substances.  He has no tremors, shakes or any EPS.  His energy level is fair.  His appetite is okay.  His vital signs are stable.  Suicidal Ideation: No Plan Formed: No Patient has means to carry out plan: No  Homicidal Ideation: No Plan Formed: No Patient has means to carry out plan: No  Medical History; Patient has hypertension, obesity, angina, coronary artery disease, hyperlipidemia and obstructive sleep apnea. His primary care physician is Dr. Birdena Jubilee. Patient has no history of seizures.   Past Psychiatric History/Hospitalization(s) Patient was admitted to behavioral [facility] after taken overdose on Ambien. He has history of taking overdose on Ambien 2 other times but does not require psychiatric inpatient treatment. He admitted  history of bad temper mood swing severe anger rage and poor sleep. He endorse history of manic-like symptoms when he gets very impulsive and aggressive. He has history of getting speeding tickets. He denies any psychosis or any hallucination. He was never tried any psychotropic medication other than Xanax and Ambien by his primary care physician Anxiety: Yes Bipolar Disorder: No Depression: No Mania: Yes Psychosis: No Schizophrenia: No Personality Disorder: No Hospitalization for psychiatric illness: Yes History of Electroconvulsive Shock Therapy: No Prior Suicide Attempts: Yes   Review of Systems: Psychiatric: Agitation: No Hallucination: No Depressed Mood: No Insomnia: No Hypersomnia: No Altered Concentration: No Feels Worthless: No Grandiose Ideas: No Belief In Special Powers: No New/Increased Substance Abuse: No Compulsions: No  Neurologic: Headache: No Seizure: No Paresthesias: No  Outpatient Encounter Prescriptions as of 12/18/2015  Medication Sig  . aspirin 81 MG tablet Take 1 tablet (81 mg total) by mouth daily.  Marland Kitchen b complex vitamins tablet Take 1 tablet by mouth daily.  . calcium gluconate 500 MG tablet Take 500 mg by mouth daily.  . Cholecalciferol (VITAMIN D) 2000 UNITS tablet Take 1 tablet (2,000 Units total) by mouth daily.  . famotidine (PEPCID) 20 MG tablet Take 20 mg by mouth 2 (two) times daily as needed for heartburn.   . gabapentin (NEURONTIN) 300 MG capsule TAKE 3 BY MOUTH THREE TIMES DAILY (Patient taking differently: 900 mg 3 (three) times daily. TAKE 3 BY MOUTH THREE TIMES DAILY)  . hydrOXYzine (  ATARAX/VISTARIL) 50 MG tablet Take 2 tab at bed time  . losartan-hydrochlorothiazide (HYZAAR) 100-12.5 MG tablet Take 1 tablet by mouth daily.  . Lutein 40 MG CAPS Take 1 capsule by mouth daily.  . magnesium gluconate (MAGONATE) 500 MG tablet Take 500 mg by mouth daily.  . metFORMIN (GLUCOPHAGE) 500 MG tablet Take 1 tablet (500 mg total) by mouth daily with  breakfast.  . Multiple Vitamin (MULTIVITAMIN) capsule Take 1 capsule by mouth daily.  . nitroGLYCERIN (NITROSTAT) 0.4 MG SL tablet Place 1 tablet (0.4 mg total) under the tongue every 5 (five) minutes as needed for chest pain. If chest pain not resolved, after 3 doses 5 minutes apart--call 911  . omeprazole (PRILOSEC) 20 MG capsule Take 20 mg by mouth daily.  Marland Kitchen oxyCODONE-acetaminophen (ROXICET) 5-325 MG tablet Take 1-2 tablets by mouth every 4 (four) hours as needed for moderate pain or severe pain. (Patient not taking: Reported on 12/03/2015)  . Potassium 99 MG TABS Take 1 tablet by mouth daily.  . sertraline (ZOLOFT) 100 MG tablet Take 1 tablet (100 mg total) by mouth daily.  Berniece Pap Cartilage 740 MG CAPS Take 1 capsule by mouth 3 (three) times daily.  . simvastatin (ZOCOR) 20 MG tablet TAKE 1 BY MOUTH AT BEDTIME  . traMADol (ULTRAM) 50 MG tablet Take 50 mg by mouth every 6 (six) hours as needed for pain.  . traZODone (DESYREL) 100 MG tablet Take 1 tablet (100 mg total) by mouth at bedtime.  . [DISCONTINUED] hydrOXYzine (ATARAX/VISTARIL) 50 MG tablet Take 2 tab at bed time  . [DISCONTINUED] sertraline (ZOLOFT) 100 MG tablet Take 1 tablet (100 mg total) by mouth daily.  . [DISCONTINUED] traZODone (DESYREL) 100 MG tablet Take 1 tablet (100 mg total) by mouth at bedtime.   No facility-administered encounter medications on file as of 12/18/2015.    Recent Results (from the past 2160 hour(s))  Lipid panel     Status: None   Collection Time: 12/03/15  8:39 AM  Result Value Ref Range   Cholesterol 138 125 - 200 mg/dL   Triglycerides 120 <150 mg/dL   HDL 47 >=40 mg/dL   Total CHOL/HDL Ratio 2.9 <=5.0 Ratio   VLDL 24 <30 mg/dL   LDL Cholesterol 67 <130 mg/dL    Comment:   Total Cholesterol/HDL Ratio:CHD Risk                        Coronary Heart Disease Risk Table                                        Men       Women          1/2 Average Risk              3.4        3.3              Average  Risk              5.0        4.4           2X Average Risk              9.6        7.1           3X Average Risk  23.4       11.0 Use the calculated Patient Ratio above and the CHD Risk table  to determine the patient's CHD Risk.   COMPLETE METABOLIC PANEL WITH GFR     Status: Abnormal   Collection Time: 12/03/15  8:39 AM  Result Value Ref Range   Sodium 139 135 - 146 mmol/L   Potassium 3.9 3.5 - 5.3 mmol/L   Chloride 107 98 - 110 mmol/L   CO2 23 20 - 31 mmol/L   Glucose, Bld 138 (H) 65 - 99 mg/dL   BUN 11 7 - 25 mg/dL   Creat 0.95 0.70 - 1.33 mg/dL   Total Bilirubin 0.5 0.2 - 1.2 mg/dL   Alkaline Phosphatase 76 40 - 115 U/L   AST 23 10 - 35 U/L   ALT 26 9 - 46 U/L   Total Protein 6.4 6.1 - 8.1 g/dL   Albumin 3.9 3.6 - 5.1 g/dL   Calcium 9.1 8.6 - 10.3 mg/dL   GFR, Est African American >89 >=60 mL/min   GFR, Est Non African American >89 >=60 mL/min    Comment:   The estimated GFR is a calculation valid for adults (>=30 years old) that uses the CKD-EPI algorithm to adjust for age and sex. It is   not to be used for children, pregnant women, hospitalized patients,    patients on dialysis, or with rapidly changing kidney function. According to the NKDEP, eGFR >89 is normal, 60-89 shows mild impairment, 30-59 shows moderate impairment, 15-29 shows severe impairment and <15 is ESRD.     POCT glycosylated hemoglobin (Hb A1C)     Status: None   Collection Time: 12/03/15  8:52 AM  Result Value Ref Range   Hemoglobin A1C 6.7     Physical Exam: Consitutional ;  BP 140/80 mmHg  Pulse 97  Ht 6' 2.5" (1.892 m)  Wt 359 lb 6.4 oz (163.023 kg)  BMI 45.54 kg/m2  Musculoskeletal: Strength & Muscle Tone: within normal limits Gait & Station: normal Patient leans: N/A   Psychiatric Specialty Exam: General Appearance: Well nourished and obese  Eye Contact::  Fair  Speech:  high pitch with increased tone  Volume:  Increased  Mood:  Anxious  Affect:  Congruent   Thought Process:  Goal Directed  Orientation:  Full (Time, Place, and Person)  Thought Content:  WDL  Suicidal Thoughts:  No  Homicidal Thoughts:  No  Memory:  Immediate;   Good Recent;   Good Remote;   Good  Judgement:  Good  Insight:  Good  Psychomotor Activity:  EPS  Concentration:  Fair  Recall:  [place] of Knowledge:  Good  Language:  Good  Akathisia:  No  Handed:  Right  AIMS (if indicated):     Assets:  Communication Skills Desire for Improvement Financial Resources/Insurance Housing Physical Health Social Support  ADL's:  Intact  Cognition:  WNL  Sleep:        Established Problem, Stable/Improving (1), Review of Psycho-Social Stressors (1), Review or order clinical lab tests (1), Review and summation of old records (2), Review of Last Therapy Session (1) and Review of Medication Regimen & Side Effects (2)  Assessment: Major depressive disorder, recurrent  Axis III:  Past Medical History  Diagnosis Date  . Unspecified essential hypertension   . Morbid obesity ([place])   . Depression   . Anxiety   . Allergy   . Arthritis   . COPD (chronic obstructive pulmonary disease) ([place])   .  Emphysema of lung ([place])   . Hyperlipidemia   . OSA (obstructive sleep apnea)     uses CPAP nightly  . PONV (postoperative nausea and vomiting)     Plan:  I review his blood work results, collateral information and his current medication.  We discuss losing his weight and her exercise.  His hemoglobin A1c is 6.7.  He like to continue Vistaril 50 mg 2 tablet at night but he is taking as needed.  Continue trazodone 100 mg at bedtime and Zoloft 1 mg daily.  Discussed medication side effects and benefits.  Recommended to call us back if he is any question or any concern.  Follow-up in 3 months.  Time spent 25 minutes.  More than 50% of the time spent in psychoeducation, counseling and portion of care.   Falynn Ailey T., MD 12/18/2015

## 2015-12-19 DIAGNOSIS — G4733 Obstructive sleep apnea (adult) (pediatric): Secondary | ICD-10-CM | POA: Diagnosis not present

## 2015-12-23 ENCOUNTER — Other Ambulatory Visit: Payer: Self-pay

## 2015-12-23 MED ORDER — SIMVASTATIN 20 MG PO TABS: ORAL_TABLET | ORAL | Status: DC

## 2016-01-28 DIAGNOSIS — M79605 Pain in left leg: Secondary | ICD-10-CM | POA: Diagnosis not present

## 2016-02-03 ENCOUNTER — Encounter: Payer: Self-pay | Admitting: Pulmonary Disease

## 2016-02-03 ENCOUNTER — Ambulatory Visit (INDEPENDENT_AMBULATORY_CARE_PROVIDER_SITE_OTHER): Payer: BLUE CROSS/BLUE SHIELD | Admitting: Pulmonary Disease

## 2016-02-03 DIAGNOSIS — G4733 Obstructive sleep apnea (adult) (pediatric): Secondary | ICD-10-CM

## 2016-02-03 DIAGNOSIS — J438 Other emphysema: Secondary | ICD-10-CM

## 2016-02-03 NOTE — Assessment & Plan Note (Signed)
No evidence of airway obstruction Obesity is the main issue Do not feel that he needs bronchodilators

## 2016-02-03 NOTE — Patient Instructions (Signed)
CPAP supplies will be renewed for a year You can discuss with [facility] about new CPAP Bring by your carD so we can review  download

## 2016-02-03 NOTE — Assessment & Plan Note (Signed)
Weight loss encouraged, compliance with goal of at least 4-6 hrs every night is the expectation. Advised against medications with sedative side effects Cautioned against driving when sleepy - understanding that sleepiness will vary on a day to day basis   We will review his download and adjust CPAP settings as needed. He will investigate about a new CPAP and we will provide prescription as needed

## 2016-02-03 NOTE — Progress Notes (Signed)
   Subjective:    Patient ID: Matthew Hutchinson, male    DOB: 17-Dec-1960, 55 y.o.   MRN: [id number]  HPI  Chief Complaint  Patient presents with  . Sleep Apnea    Former [facility] patient, wears CPAP every night, did not bring SD card.  Pt has not sent out the SD card to be downloaded in years.  DME: [facility].      55 year old morbidly obese diabetic and hypertensive with moderate OSA He is compliant with his CPAP by report He lives in [place] and the loose power very often she wonders if he can have a battery operated travel CPAP. His current machine is about 55 years old. DME has been good with supplies-he uses a swift nasal pillows and is happy with this. Did not get a good seal with nasal or full face mask Pressure okay, no dryness Has been unable to lose weight His dose of gabapentin has been increased to 3600 mg per day and this does make him a little fatigued & drowsy during the day He reports occasional dyspnea and he attributes this to obesity-he was a heavy smoker about 50 pack years and quit in 2005  Spirometry/2014 showed a ratio of 77 FEV1 of 69% NPSG 2010:  AHI 15/hr Auto titration 07/2010:  Optimal pressure 8cm  Review of Systems Patient denies significant dyspnea,cough, hemoptysis,  chest pain, palpitations, pedal edema, orthopnea, paroxysmal nocturnal dyspnea, lightheadedness, nausea, vomiting, abdominal or  leg pains      Objective:   Physical Exam  Gen. Pleasant, obese, in no distress ENT - no lesions, no post nasal drip Neck: No JVD, no thyromegaly, no carotid bruits Lungs: no use of accessory muscles, no dullness to percussion, decreased without rales or rhonchi  Cardiovascular: Rhythm regular, heart sounds  normal, no murmurs or gallops, no peripheral edema Musculoskeletal: No deformities, no cyanosis or clubbing , no tremors       Assessment & Plan:

## 2016-02-03 NOTE — Addendum Note (Signed)
Addended by: York Ram on: 02/03/2016 05:13 PM   Modules accepted: Orders

## 2016-02-10 DIAGNOSIS — G4733 Obstructive sleep apnea (adult) (pediatric): Secondary | ICD-10-CM | POA: Diagnosis not present

## 2016-03-11 ENCOUNTER — Ambulatory Visit (INDEPENDENT_AMBULATORY_CARE_PROVIDER_SITE_OTHER): Payer: BLUE CROSS/BLUE SHIELD | Admitting: Family Medicine

## 2016-03-11 ENCOUNTER — Encounter: Payer: Self-pay | Admitting: Family Medicine

## 2016-03-11 VITALS — BP 166/84 | HR 85 | Temp 98.6°F | Ht 72.5 in | Wt 363.0 lb

## 2016-03-11 DIAGNOSIS — G8929 Other chronic pain: Secondary | ICD-10-CM | POA: Diagnosis not present

## 2016-03-11 DIAGNOSIS — I10 Essential (primary) hypertension: Secondary | ICD-10-CM | POA: Diagnosis not present

## 2016-03-11 DIAGNOSIS — E669 Obesity, unspecified: Secondary | ICD-10-CM

## 2016-03-11 DIAGNOSIS — M79602 Pain in left arm: Secondary | ICD-10-CM

## 2016-03-11 DIAGNOSIS — M79605 Pain in left leg: Secondary | ICD-10-CM

## 2016-03-11 DIAGNOSIS — E119 Type 2 diabetes mellitus without complications: Secondary | ICD-10-CM | POA: Diagnosis not present

## 2016-03-11 LAB — HEMOGLOBIN A1C
Hgb A1c MFr Bld: 6.5 % — ABNORMAL HIGH (ref <5.7)
MEAN PLASMA GLUCOSE: 140 mg/dL

## 2016-03-11 MED ORDER — METFORMIN HCL 500 MG PO TABS
500.0000 mg | ORAL_TABLET | Freq: Every day | ORAL | 1 refills | Status: DC
Start: 2016-03-11 — End: 2016-12-03

## 2016-03-11 MED ORDER — LOSARTAN POTASSIUM-HCTZ 100-25 MG PO TABS: 1.0000 | ORAL_TABLET | Freq: Every day | ORAL | 1 refills | Status: DC

## 2016-03-11 NOTE — Progress Notes (Signed)
By signing my name below, I, [name], attest that this documentation has been prepared under the direction and in the presence of [name].  Electronically Signed: [name], Medical Scribe. 03/11/16. 8:42 AM.  Subjective:    Patient ID: Matthew Hutchinson, male    DOB: 30-Jun-1961, 55 y.o.   MRN: [id number]  HPI Chief Complaint  Patient presents with  . Follow-up     16mo old diagnosis of DM  . Diabetes    HPI Comments: Matthew Hutchinson is a 55 y.o. male with a PMHx of HTN, OSA, CAD, DM, and HLD who presents to the [facility] for DM follow-up.  DM: Started on low dose Metformin 500 mg. Exercise has been difficulty with his chronic left leg pain. He was referred to pain management last visit. Pt went to the ophthalmologist with Dr. [name] and his eyes now have cataracts, but no diabetic retinopathy was found. Pt is interested in getting double cataracts surgery. Pt states his astigmatism has went away. Pt denies experiencing any negative side effects on Metformin. Lab Results  Component Value Date   HGBA1C 6.7 12/03/2015   No results found for: MICROALBUR, [id number]   Obesity: As above difficulty with exercise due to chronic leg pain. Diet and portion control was discussed at last visit. Pt states his diet has been great, he just doesn't exercise. Pt is trying to walk more no matter how bad it works. Pt has an elliptical at home, but has it stored in his garage and plans on getting it out to use it. Pt doesn't like the idea of exercising in a public pool because it's dirty.  Wt Readings from Last 3 Encounters:  03/11/16 (!) 363 lb (164.7 kg)  02/03/16 (!) 367 lb (166.5 kg)  12/03/15 (!) 361 lb 6.4 oz (163.9 kg)   Chronic Left Leg Pain: Pt's knee bothers him the most when the weather changes. Pt isn't sure if his leg pain is from old age or from his medications Pt went to the pain specialist and he inc his Gabapentin from 900 mg to 1200  mg. Pt was disoriented, confused, and states he was mentally "gone" for a month to "let [the inc dose] ride out". Pt went back down to 900 mg TID the past 2 weeks, and his symptoms has completely resolved since.  HTN: Elevated at last visit at 144/92. Increased Losartan to 100 mg and cont same dose of HCTZ 50/12.5 mg QD. Pt's bp has been running 140/70 while the lowest was in the 120-130, but it mainly stays in the 130s-140s.. Lab Results  Component Value Date   CREATININE 0.95 12/03/2015    Patient Active Problem List   Diagnosis Date Noted  . Chronic pain due to trauma 06/25/2013  . Depressive disorder, not elsewhere classified 11/09/2012  . CAD (coronary artery disease) 02/21/2012  . Lipid disorder 02/21/2012  . Angina pectoris 09/27/2011  . Obstructive sleep apnea 05/20/2010  . Essential hypertension 04/17/2010  . EMPHYSEMA 04/17/2010  . WEIGHT GAIN, ABNORMAL 04/17/2010   Past Medical History:  Diagnosis Date  . Allergy   . Anxiety   . Arthritis   . COPD (chronic obstructive pulmonary disease) ([facility])   . Depression   . Emphysema of lung ([facility])   . Hyperlipidemia   . Morbid obesity ([facility])   . OSA (obstructive sleep apnea)    uses CPAP nightly  . PONV (postoperative nausea and vomiting)   . Unspecified essential hypertension  Past Surgical History:  Procedure Laterality Date  . INSERTION OF MESH N/A 09/18/2015   Procedure: INSERTION OF MESH;  Surgeon: [name], MD;  Location: [facility];  Service: General;  Laterality: N/A;  . LEG SURGERY Left   . SHOULDER ARTHROSCOPY Left   . TONSILLECTOMY    . UMBILICAL HERNIA REPAIR N/A 09/18/2015   Procedure: HERNIA REPAIR UMBILICAL ADULT WITH MESH ;  Surgeon: [name], MD;  Location: [facility];  Service: General;  Laterality: N/A;   Allergies  Allergen Reactions  . Penicillins Anaphylaxis    REACTION: anaphylaxis  . Valsartan     REACTION: itch/swelling   Prior to Admission  medications   Medication Sig Start Date End Date Taking? Authorizing Provider  aspirin 81 MG tablet Take 1 tablet (81 mg total) by mouth daily. 11/13/12  Yes Charm RingsJamison Y Lord, NP  b complex vitamins tablet Take 1 tablet by mouth daily.   Yes Historical Provider, MD  calcium gluconate 500 MG tablet Take 500 mg by mouth daily.   Yes Historical Provider, MD  Cholecalciferol (VITAMIN D) 2000 UNITS tablet Take 1 tablet (2,000 Units total) by mouth daily. 11/13/12  Yes Charm RingsJamison Y Lord, NP  hydrOXYzine (ATARAX/VISTARIL) 50 MG tablet Take 2 tab at bed time 12/18/15  Yes Cleotis NipperSyed T Arfeen, MD  losartan-hydrochlorothiazide (HYZAAR) 100-12.5 MG tablet Take 1 tablet by mouth daily. 12/03/15  Yes Shade FloodJeffrey R Tysheka Fanguy, MD  Lutein 40 MG CAPS Take 1 capsule by mouth daily.   Yes Historical Provider, MD  magnesium gluconate (MAGONATE) 500 MG tablet Take 500 mg by mouth daily.   Yes Historical Provider, MD  metFORMIN (GLUCOPHAGE) 500 MG tablet Take 1 tablet (500 mg total) by mouth daily with breakfast. 12/03/15  Yes Shade FloodJeffrey R Sonnie Bias, MD  Multiple Vitamin (MULTIVITAMIN) capsule Take 1 capsule by mouth daily. 11/13/12  Yes Charm RingsJamison Y Lord, NP  nitroGLYCERIN (NITROSTAT) 0.4 MG SL tablet Place 1 tablet (0.4 mg total) under the tongue every 5 (five) minutes as needed for chest pain. If chest pain not resolved, after 3 doses 5 minutes apart--call 911 11/13/12  Yes Charm RingsJamison Y Lord, NP  Potassium 99 MG TABS Take 1 tablet by mouth daily.   Yes Historical Provider, MD  sertraline (ZOLOFT) 100 MG tablet Take 1 tablet (100 mg total) by mouth daily. 12/18/15  Yes Cleotis NipperSyed T Arfeen, MD  Shark Cartilage 740 MG CAPS Take 1 capsule by mouth 3 (three) times daily.   Yes Historical Provider, MD  simvastatin (ZOCOR) 20 MG tablet TAKE 1 BY MOUTH AT BEDTIME 12/23/15  Yes Shade FloodJeffrey R Artist Bloom, MD  traMADol (ULTRAM) 50 MG tablet Take 50 mg by mouth every 6 (six) hours as needed for pain.   Yes Historical Provider, MD  traZODone (DESYREL) 100 MG tablet Take 1 tablet (100 mg  total) by mouth at bedtime. 12/18/15  Yes Cleotis NipperSyed T Arfeen, MD  gabapentin (NEURONTIN) 300 MG capsule TAKE 3 BY MOUTH THREE TIMES DAILY Patient taking differently: 900 mg 3 (three) times daily. TAKE 3 BY MOUTH THREE TIMES DAILY 06/02/15   Shade FloodJeffrey R Bradley [place], MD  oxyCODONE-acetaminophen (ROXICET) 5-325 MG tablet Take 1-2 tablets by mouth every 4 (four) hours as needed for moderate pain or severe pain. Patient not taking: Reported on 03/11/2016 09/18/15   [name], MD   Social History   Social History  . Marital status: Married    Spouse name: N/A  . Number of children: N/A  . 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 Smoking status: Former Smoker    Packs/day: 2.00    Years: 26.00    Types: Cigarettes    Quit date: 08/13/2003  . Smokeless tobacco: Never Used     Comment: 2ppd x 26 years  . Alcohol use 0.0 oz/week     Comment: social  . Drug use: No  . Sexual activity: Yes   Other Topics Concern  . Not on file   Social History Narrative   Married. Education: college. Exercise: No.   Depression screen [facility] 2/9 03/11/2016 12/03/2015 06/30/2015 06/02/2015 03/06/2015  Decreased Interest 0 0 0 0 0  Down, Depressed, Hopeless 0 0 0 0 0  PHQ - 2 Score 0 0 0 0 0   Review of Systems  Cardiovascular: Negative for leg swelling.  Musculoskeletal: Positive for arthralgias (chronic).  Psychiatric/Behavioral: Positive for confusion (drug induced but has resolved) and decreased concentration (drug induced but has resolved).   Objective:  Physical Exam  Constitutional: He is oriented to person, place, and time. He appears well-developed and well-nourished.  HENT:  Head: Normocephalic and atraumatic.  Eyes: EOM are normal. Pupils are equal, round, and reactive to light.  Neck: No JVD present. Carotid bruit is not present.  Cardiovascular: Normal rate, regular rhythm and normal heart sounds.   No murmur heard. Pulmonary/Chest: Effort  normal and breath sounds normal. He has no rales.  Musculoskeletal: He exhibits no edema.  Neurological: He is alert and oriented to person, place, and time.  Skin: Skin is warm and dry.  Psychiatric: He has a normal mood and affect.  Vitals reviewed.  BP (!) 166/84 (BP Location: Left Arm, Patient Position: Sitting, Cuff Size: Large)   Pulse 85   Temp 98.6 F (37 C) (Oral)   Ht 6' 0.5" (1.842 m)   Wt (!) 363 lb (164.7 kg)   SpO2 (!) 89%   BMI 48.55 kg/m  Assessment & Plan:   Matthew Hutchinson is a 55 y.o. male Obesity  - continue diet and portion control. Exercise somewhat limited by chronic leg pain, but discussed various forms of exercise including water-based exercise, or recumbent bike.  He has a new piece of exercise equipment at home that he may try first.  Essential hypertension - Plan: losartan-hydrochlorothiazide (HYZAAR) 100-25 MG tablet  -Still elevated, increase HCTZ component to 25 mg.  Type 2 diabetes mellitus without complication, without long-term current use of insulin ([facility]) - Plan: metFORMIN (GLUCOPHAGE) 500 MG tablet, Hemoglobin A1C  - Tolerating metformin 500 mg daily, check A1c.  Chronic leg pain, left  - Status post pain management eval, no change in management. Tolerating 900 mg Neurontin dose as 1200 appears to have been too much.   -Plans on possible titration down on Neurontin, but decided against change now with upcoming change in weather as this tends to be the worst time for his symptoms.  Meds ordered this encounter  Medications  . losartan-hydrochlorothiazide (HYZAAR) 100-25 MG tablet    Sig: Take 1 tablet by mouth daily.    Dispense:  90 tablet    Refill:  1  . metFORMIN (GLUCOPHAGE) 500 MG tablet    Sig: Take 1 tablet (500 mg total) by mouth daily with breakfast.    Dispense:  90 tablet    Refill:  1   Patient Instructions    Blood pressure is still above goal. I will increase the diuretic component of the combination pill.  If any low  blood pressures, lightheadedness, dizziness with this dose, let me know. No other changes in medications right now, I will let you know when the A1c results have returned. Follow-up in 3 months, sooner if any new symptoms.  You can check into different forms of exercise equipment that may be tolerated by your leg pain. No change in dose of Neurontin for now. We can discuss tapering this in the future.   IF you received an x-ray today, you will receive an invoice from [facility]. Please contact [facility] at [phone] with questions or concerns regarding your invoice.   IF you received labwork today, you will receive an invoice from [employer]. Please contact [facility] at [phone] with questions or concerns regarding your invoice.   Our billing staff will not be able to assist you with questions regarding bills from these companies.  You will be contacted with the lab results as soon as they are available. The fastest way to get your results is to activate your My Chart account. Instructions are located on the last page of this paperwork. If you have not heard from [place] regarding the results in 2 weeks, please contact this office.        I personally performed the services described in this documentation, which was scribed in my presence. The recorded information has been reviewed and considered, and addended by me as needed.   Signed,   [name], MD [facility].  03/11/16 6:07 PM

## 2016-03-11 NOTE — Patient Instructions (Addendum)
  Blood pressure is still above goal. I will increase the diuretic component of the combination pill. If any low blood pressures, lightheadedness, dizziness with this dose, let me know. No other changes in medications right now, I will let you know when the A1c results have returned. Follow-up in 3 months, sooner if any new symptoms.  You can check into different forms of exercise equipment that may be tolerated by your leg pain. No change in dose of Neurontin for now. We can discuss tapering this in the future.   IF you received an x-ray today, you will receive an invoice from [facility]. Please contact [facility] at [phone] with questions or concerns regarding your invoice.   IF you received labwork today, you will receive an invoice from [facility]. Please contact [facility] at [phone] with questions or concerns regarding your invoice.   Our billing staff will not be able to assist you with questions regarding bills from these companies.  You will be contacted with the lab results as soon as they are available. The fastest way to get your results is to activate your My Chart account. Instructions are located on the last page of this paperwork. If you have not heard from us regarding the results in 2 weeks, please contact this office.

## 2016-03-18 DIAGNOSIS — G4733 Obstructive sleep apnea (adult) (pediatric): Secondary | ICD-10-CM | POA: Diagnosis not present

## 2016-03-21 ENCOUNTER — Encounter: Payer: Self-pay | Admitting: Family Medicine

## 2016-03-22 ENCOUNTER — Other Ambulatory Visit (HOSPITAL_COMMUNITY): Payer: Self-pay | Admitting: Psychiatry

## 2016-03-22 DIAGNOSIS — F330 Major depressive disorder, recurrent, mild: Secondary | ICD-10-CM

## 2016-03-23 ENCOUNTER — Ambulatory Visit (HOSPITAL_COMMUNITY): Payer: Self-pay | Admitting: Psychiatry

## 2016-05-13 ENCOUNTER — Encounter (HOSPITAL_COMMUNITY): Payer: Self-pay | Admitting: Psychiatry

## 2016-05-13 ENCOUNTER — Ambulatory Visit (INDEPENDENT_AMBULATORY_CARE_PROVIDER_SITE_OTHER): Payer: BLUE CROSS/BLUE SHIELD | Admitting: Psychiatry

## 2016-05-13 DIAGNOSIS — F330 Major depressive disorder, recurrent, mild: Secondary | ICD-10-CM

## 2016-05-13 DIAGNOSIS — Z79899 Other long term (current) drug therapy: Secondary | ICD-10-CM | POA: Diagnosis not present

## 2016-05-13 MED ORDER — HYDROXYZINE PAMOATE 50 MG PO CAPS: ORAL_CAPSULE | ORAL | 0 refills | Status: DC

## 2016-05-13 MED ORDER — TRAZODONE HCL 100 MG PO TABS: ORAL_TABLET | ORAL | 0 refills | Status: DC

## 2016-05-13 MED ORDER — SERTRALINE HCL 100 MG PO TABS
ORAL_TABLET | ORAL | 0 refills | Status: DC
Start: 2016-05-13 — End: 2016-08-24

## 2016-05-13 NOTE — Progress Notes (Signed)
[facility] [id number] Progress Note  Matthew DuttonGeorge T Benton-Elliot [id number] 55 y.o.  05/13/2016 3:35 PM  Chief Complaint:  I am in a lot of pain.  My ankle is hurting.   History of Present Illness:  Greggory StallionGeorge came for his follow-up appointment.  He's been complaining of increased pain in his ankle.  He has difficulty walking and need stick to help his balance.  He admitted lately more frustrated because of political situation .  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  He is concerned about his general health.  He recently seen his primary care physician and his hemoglobin A1c is 6.5.  He was told that he is prediabetic and he need to lose weight.  However due to pain he cannot do exercise or blocking.  He likes to continue Zoloft, trazodone and Vistaril as needed.  Overall he described his mood and depression is stable on his current psychiatric medication.  He denies any agitation, anger, mania, psychosis or any feeling of hopelessness or worthlessness.  His appetite is okay.  He has no tremors shakes or any EPS.  His vital signs are stable.  Suicidal Ideation: No Plan Formed: No Patient has means to carry out plan: No  Homicidal Ideation: No Plan Formed: No Patient has means to carry out plan: No  Medical History; Patient has hypertension, obesity, angina, coronary artery disease, hyperlipidemia and obstructive sleep apnea. His primary care physician is Dr. Jamas LavGuess. Patient has no history of seizures.   Past Psychiatric History/Hospitalization(s) [facility] after taken overdose on Ambien. He has history of taking overdose on Ambien 2 other times but does not require psychiatric inpatient treatment. He admitted history of bad temper mood swing severe anger rage and poor sleep. He endorse history of manic-like symptoms when he gets very impulsive and aggressive. He has history of getting speeding  tickets. He denies any psychosis or any hallucination. He was never tried any psychotropic medication other than Xanax and Ambien by his primary care physician Anxiety: Yes Bipolar Disorder: No Depression: No Mania: Yes Psychosis: No Schizophrenia: No Personality Disorder: No Hospitalization for psychiatric illness: Yes History of Electroconvulsive Shock Therapy: No Prior Suicide Attempts: Yes   Review of Systems: Psychiatric: Agitation: No Hallucination: No Depressed Mood: No Insomnia: No Hypersomnia: No Altered Concentration: No Feels Worthless: No Grandiose Ideas: No Belief In Special Powers: No New/Increased Substance Abuse: No Compulsions: No  Neurologic: Headache: No Seizure: No Paresthesias: No  Outpatient Encounter Prescriptions as of 05/13/2016  Medication Sig  . aspirin 81 MG tablet Take 1 tablet (81 mg total) by mouth daily.  Marland Kitchen. b complex vitamins tablet Take 1 tablet by mouth daily.  . calcium gluconate 500 MG tablet Take 500 mg by mouth daily.  . Cholecalciferol (VITAMIN D) 2000 UNITS tablet Take 1 tablet (2,000 Units total) by mouth daily.  Marland Kitchen. gabapentin (NEURONTIN) 300 MG capsule TAKE 3 BY MOUTH THREE TIMES DAILY (Patient taking differently: 900 mg 3 (three) times daily. TAKE 3 BY MOUTH THREE TIMES DAILY)  . hydrOXYzine (ATARAX/VISTARIL) 50 MG tablet Take 2 tab at bed time  . hydrOXYzine (VISTARIL) 50 MG capsule TAKE 2 BY MOUTH AT BEDTIME  . losartan-hydrochlorothiazide (HYZAAR) 100-25 MG tablet Take 1 tablet by mouth daily.  . Lutein 40 MG CAPS Take 1 capsule by mouth daily.  . magnesium gluconate (MAGONATE) 500 MG tablet Take 500 mg by mouth daily.  .Marland Kitchen  metFORMIN (GLUCOPHAGE) 500 MG tablet Take 1 tablet (500 mg total) by mouth daily with breakfast.  . Multiple Vitamin (MULTIVITAMIN) capsule Take 1 capsule by mouth daily.  . nitroGLYCERIN (NITROSTAT) 0.4 MG SL tablet Place 1 tablet (0.4 mg total) under the tongue every 5 (five) minutes as needed for chest  pain. If chest pain not resolved, after 3 doses 5 minutes apart--call 911  . Potassium 99 MG TABS Take 1 tablet by mouth daily.  . sertraline (ZOLOFT) 100 MG tablet TAKE 1 BY MOUTH DAILY  . Shark Cartilage 740 MG CAPS Take 1 capsule by mouth 3 (three) times daily.  . simvastatin (ZOCOR) 20 MG tablet TAKE 1 BY MOUTH AT BEDTIME  . traMADol (ULTRAM) 50 MG tablet Take 50 mg by mouth every 6 (six) hours as needed for pain.  . traZODone (DESYREL) 100 MG tablet TAKE 1 BY MOUTH AT BEDTIME  . [DISCONTINUED] hydrOXYzine (VISTARIL) 50 MG capsule TAKE 2 BY MOUTH AT BEDTIME  . [DISCONTINUED] sertraline (ZOLOFT) 100 MG tablet TAKE 1 BY MOUTH DAILY  . [DISCONTINUED] traZODone (DESYREL) 100 MG tablet TAKE 1 BY MOUTH AT BEDTIME   No facility-administered encounter medications on file as of 05/13/2016.     Recent Results (from the past 2160 hour(s))  Hemoglobin A1C     Status: Abnormal   Collection Time: 03/11/16  9:24 AM  Result Value Ref Range   Hgb A1c MFr Bld 6.5 (H) <5.7 %    Comment:   For someone without known diabetes, a hemoglobin A1c value of 6.5% or greater indicates that they may have diabetes and this should be confirmed with a follow-up test.   For someone with known diabetes, a value <7% indicates that their diabetes is well controlled and a value greater than or equal to 7% indicates suboptimal control. A1c targets should be individualized based on duration of diabetes, age, comorbid conditions, and other considerations.   Currently, no consensus exists for use of hemoglobin A1c for diagnosis of diabetes for children.      Mean Plasma Glucose 140 mg/dL    Physical Exam: Consitutional ;  BP 128/74   Pulse 98   Ht 6' 2.5" (1.892 m)   Wt (!) 357 lb 9.6 oz (162.2 kg)   BMI 45.30 kg/m   Musculoskeletal: Strength & Muscle Tone: within normal limits Gait & Station: Difficulty walking due to pain Patient leans: N/A   Psychiatric Specialty Exam: General Appearance: Well  nourished and obese  Eye Contact::  Fair  Speech:  high pitch with increased tone  Volume:  Increased  Mood:  Anxious  Affect:  Congruent  Thought Process:  Goal Directed  Orientation:  Full (Time, Place, and Person)  Thought Content:  WDL  Suicidal Thoughts:  No  Homicidal Thoughts:  No  Memory:  Immediate;   Good Recent;   Good Remote;   Good  Judgement:  Good  Insight:  Good  Psychomotor Activity:  EPS  Concentration:  Fair  Recall:  Fair  Fund of Knowledge:  Good  Language:  Good  Akathisia:  No  Handed:  Right  AIMS (if indicated):     Assets:  Communication Skills Desire for Improvement Financial Resources/Insurance Housing Physical Health Social Support  ADL's:  Intact  Cognition:  WNL  Sleep:        Established Problem, Stable/Improving (1), Review of Psycho-Social Stressors (1), Review or order clinical lab tests (1), Review of Last Therapy Session (1) and Review of Medication Regimen & Side Effects (  2)  Assessment: Major depressive disorder, recurrent  Axis III:  Past Medical History:  Diagnosis Date  . Allergy   . Anxiety   . Arthritis   . COPD (chronic obstructive pulmonary disease) (HCC)   . Depression   . Emphysema of lung (HCC)   . Hyperlipidemia   . Morbid obesity (HCC)   . OSA (obstructive sleep apnea)    uses CPAP nightly  . PONV (postoperative nausea and vomiting)   . Unspecified essential hypertension     Plan:  I review his blood work results, collateral information and his current medication.  Encouraged to watch his calorie intake and to discuss his primary care physician to get an additional consult .  He does not want to change his medication.  He has no side effects.  I will continue Vistaril 50 mg 1-2 tablet at night as needed, Continue trazodone 100 mg at bedtime and Zoloft 100 mg daily.  Discussed medication side effects and benefits.  Recommended to call us back if he is any question or any concern.  Follow-up in 3-4 months.      ARFEEN,SYED T., MD 05/13/2016         Patient ID: Matthew Hutchinson, male   DOB: 1961/03/08, 55 y.o.   MRN: [id number]

## 2016-06-10 ENCOUNTER — Ambulatory Visit (INDEPENDENT_AMBULATORY_CARE_PROVIDER_SITE_OTHER): Payer: BLUE CROSS/BLUE SHIELD | Admitting: Family Medicine

## 2016-06-10 ENCOUNTER — Encounter: Payer: Self-pay | Admitting: Family Medicine

## 2016-06-10 VITALS — BP 130/86 | HR 86 | Temp 98.5°F | Resp 16 | Ht 73.0 in | Wt 357.0 lb

## 2016-06-10 DIAGNOSIS — Z6841 Body Mass Index (BMI) 40.0 and over, adult: Secondary | ICD-10-CM | POA: Diagnosis not present

## 2016-06-10 DIAGNOSIS — I10 Essential (primary) hypertension: Secondary | ICD-10-CM

## 2016-06-10 DIAGNOSIS — Z23 Encounter for immunization: Secondary | ICD-10-CM

## 2016-06-10 DIAGNOSIS — E669 Obesity, unspecified: Secondary | ICD-10-CM | POA: Diagnosis not present

## 2016-06-10 DIAGNOSIS — E119 Type 2 diabetes mellitus without complications: Secondary | ICD-10-CM | POA: Diagnosis not present

## 2016-06-10 DIAGNOSIS — Z13220 Encounter for screening for lipoid disorders: Secondary | ICD-10-CM | POA: Diagnosis not present

## 2016-06-10 DIAGNOSIS — IMO0001 Reserved for inherently not codable concepts without codable children: Secondary | ICD-10-CM

## 2016-06-10 LAB — COMPLETE METABOLIC PANEL WITH GFR
ALBUMIN: 4 g/dL (ref 3.6–5.1)
ALK PHOS: 75 U/L (ref 40–115)
ALT: 33 U/L (ref 9–46)
AST: 32 U/L (ref 10–35)
BUN: 12 mg/dL (ref 7–25)
CALCIUM: 8.7 mg/dL (ref 8.6–10.3)
CO2: 25 mmol/L (ref 20–31)
Chloride: 107 mmol/L (ref 98–110)
Creat: 1 mg/dL (ref 0.70–1.33)
GFR, EST NON AFRICAN AMERICAN: 84 mL/min (ref >=60)
Glucose, Bld: 163 mg/dL — ABNORMAL HIGH (ref 65–99)
POTASSIUM: 3.8 mmol/L (ref 3.5–5.3)
Sodium: 140 mmol/L (ref 135–146)
Total Bilirubin: 0.7 mg/dL (ref 0.2–1.2)
Total Protein: 6.3 g/dL (ref 6.1–8.1)

## 2016-06-10 LAB — LIPID PANEL
CHOL/HDL RATIO: 3 ratio (ref <5.0)
CHOLESTEROL: 133 mg/dL (ref <200)
HDL: 44 mg/dL (ref >40)
LDL Cholesterol: 67 mg/dL (ref <100)
TRIGLYCERIDES: 111 mg/dL (ref <150)
VLDL: 22 mg/dL (ref <30)

## 2016-06-10 NOTE — Progress Notes (Addendum)
By signing my name below, I, [name], attest that this documentation has been prepared under the direction and in the presence of [name], MD.  Electronically Signed: [name], Medical Scribe. 06/10/16. 8:25 AM.  Subjective:    Patient ID: Matthew Hutchinson, male    DOB: [birth year], 55 y.o.   MRN: [id number]  HPI Chief Complaint  Patient presents with  . Diabetes    deprression scale during triage, score 13    HPI Comments: Matthew Hutchinson is a 55 y.o. male who presents to the [facility] for DM follow-up. Hx of HTN, obesity, T2DM, OSA, CAD, HLD. Pt is fasting.  Abdominal Discomfort: Reports feeling a "twinge on the screen door", but no abdominal pain.  DM: He was on metformin 500mg  QD at last visit, continued on same dose. Optho is Dr. [name]; no retinopathy, but does have cataracts. He's compliant with metformin. Reports occasional upset stomach but doesn't think it's from his medication.  Denies experiencing any negative side effects from metformin such as abdominal pain.  Lab Results  Component Value Date   HGBA1C 6.5 (H) 03/11/2016   No results found for: [name]   Lab Results  Component Value Date   CHOL 138 12/03/2015   HDL 47 12/03/2015   LDLCALC 67 12/03/2015   TRIG 120 12/03/2015   CHOLHDL 2.9 12/03/2015   Obesity: Difficulty exercising due to chronic leg pain. Diet and portion control has been discussed. Weight has been improving.  Wt Readings from Last 3 Encounters:  06/10/16 (!) 357 lb (161.9 kg)  03/11/16 (!) 363 lb (164.7 kg)  02/03/16 (!) 367 lb (166.5 kg)  Body mass index is 47.1 kg/m.  HTN: Persistently elevated at last visit after inc of losartan at prior visit . Inc HCTZ to 25mg , remained on 100mg  of losartan.  BP at home reads 120/80, with a few systolic 140 readings. No new side effects.   Chronic Left Leg Pain: He has been evaluated by pain management without treatment. He takes  neurontin 900mg  TID, planned on decreasing this as tolerated. Compliant with neurontin 900mg  TID and denies experiencing negative side effects from it.  Depression: States "anyone with a brain has something bothering them in this time in history. 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 Depression screen [facility] 2/9 06/10/2016 03/11/2016 12/03/2015 06/30/2015 06/02/2015  Decreased Interest 1 0 0 0 0  Down, Depressed, Hopeless 2 0 0 0 0  PHQ - 2 Score 3 0 0 0 0  Altered sleeping 3 - - - -  Tired, decreased energy 2 - - - -  Change in appetite 0 - - - -  Feeling bad or failure about yourself  0 - - - -  Trouble concentrating 3 - - - -  Moving slowly or fidgety/restless 2 - - - -  Suicidal thoughts 0 - - - -  PHQ-9 Score 13 - - - -  Difficult doing work/chores Somewhat difficult - - - -   Patient Active Problem List   Diagnosis Date Noted  . Chronic pain due to trauma 06/25/2013  . Depressive disorder, not elsewhere classified 11/09/2012  . CAD (coronary artery disease) 02/21/2012  . Lipid disorder 02/21/2012  . Angina pectoris 09/27/2011  . Obstructive sleep apnea 05/20/2010  . Essential hypertension 04/17/2010  . EMPHYSEMA 04/17/2010  . WEIGHT GAIN, ABNORMAL 04/17/2010   Past Medical History:  Diagnosis Date  .  Allergy   . Anxiety   . Arthritis   . COPD (chronic obstructive pulmonary disease) (HCC)   . Depression   . Emphysema of lung (HCC)   . Hyperlipidemia   . Morbid obesity (HCC)   . OSA (obstructive sleep apnea)    uses CPAP nightly  . PONV (postoperative nausea and vomiting)   . Unspecified essential hypertension    Past Surgical History:  Procedure Laterality Date  . INSERTION OF MESH N/A 09/18/2015   Procedure: INSERTION OF MESH;  Surgeon: [name], MD;  Location: [facility];  Service: General;  Laterality: N/A;  . LEG SURGERY Left   . SHOULDER ARTHROSCOPY Left   . TONSILLECTOMY    .  UMBILICAL HERNIA REPAIR N/A 09/18/2015   Procedure: HERNIA REPAIR UMBILICAL ADULT WITH MESH ;  Surgeon: [name], MD;  Location: [facility];  Service: General;  Laterality: N/A;   Allergies  Allergen Reactions  . Penicillins Anaphylaxis    REACTION: anaphylaxis  . Valsartan     REACTION: itch/swelling   Prior to Admission medications   Medication Sig Start Date End Date Taking? Authorizing Provider  aspirin 81 MG tablet Take 1 tablet (81 mg total) by mouth daily. 11/13/12   Charm RingsJamison Y Lord, NP  b complex vitamins tablet Take 1 tablet by mouth daily.    Historical Provider, MD  calcium gluconate 500 MG tablet Take 500 mg by mouth daily.    Historical Provider, MD  Cholecalciferol (VITAMIN D) 2000 UNITS tablet Take 1 tablet (2,000 Units total) by mouth daily. 11/13/12   Charm RingsJamison Y Lord, NP  gabapentin (NEURONTIN) 300 MG capsule TAKE 3 BY MOUTH THREE TIMES DAILY Patient taking differently: 900 mg 3 (three) times daily. TAKE 3 BY MOUTH THREE TIMES DAILY 06/02/15   Shade FloodJeffrey R Luciano Cinquemani, MD  hydrOXYzine (ATARAX/VISTARIL) 50 MG tablet Take 2 tab at bed time 12/18/15   Cleotis NipperSyed T Arfeen, MD  hydrOXYzine (VISTARIL) 50 MG capsule TAKE 2 BY MOUTH AT BEDTIME 05/13/16   Cleotis NipperSyed T Arfeen, MD  losartan-hydrochlorothiazide (HYZAAR) 100-25 MG tablet Take 1 tablet by mouth daily. 03/11/16   Shade FloodJeffrey R Amiri Tritch, MD  Lutein 40 MG CAPS Take 1 capsule by mouth daily.    Historical Provider, MD  magnesium gluconate (MAGONATE) 500 MG tablet Take 500 mg by mouth daily.    Historical Provider, MD  metFORMIN (GLUCOPHAGE) 500 MG tablet Take 1 tablet (500 mg total) by mouth daily with breakfast. 03/11/16   Shade FloodJeffrey R Myreon Wimer, MD  Multiple Vitamin (MULTIVITAMIN) capsule Take 1 capsule by mouth daily. 11/13/12   Charm RingsJamison Y Lord, NP  nitroGLYCERIN (NITROSTAT) 0.4 MG SL tablet Place 1 tablet (0.4 mg total) under the tongue every 5 (five) minutes as needed for chest pain. If chest pain not resolved, after 3 doses 5 minutes  apart--call 911 11/13/12   Charm RingsJamison Y Lord, NP  Potassium 99 MG TABS Take 1 tablet by mouth daily.    Historical Provider, MD  sertraline (ZOLOFT) 100 MG tablet TAKE 1 BY MOUTH DAILY 05/13/16   Cleotis NipperSyed T Arfeen, MD  Shark Cartilage 740 MG CAPS Take 1 capsule by mouth 3 (three) times daily.    Historical Provider, MD  simvastatin (ZOCOR) 20 MG tablet TAKE 1 BY MOUTH AT BEDTIME 12/23/15   Shade FloodJeffrey R Sava Proby, MD  traMADol (ULTRAM) 50 MG tablet Take 50 mg by mouth every 6 (six) hours as needed for pain.    Historical Provider, MD  traZODone (DESYREL) 100 MG tablet TAKE  1 BY MOUTH AT BEDTIME 05/13/16   Cleotis Nipper, MD   Social History   Social History  . Marital status: Married    Spouse name: N/A  . Number of children: N/A  . 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 Smoking status: Former Smoker    Packs/day: 2.00    Years: 26.00    Types: Cigarettes    Quit date: 08/13/2003  . Smokeless tobacco: Never Used     Comment: 2ppd x 26 years  . Alcohol use 0.0 oz/week     Comment: social  . Drug use: No  . Sexual activity: Yes   Other Topics Concern  . Not on file   Social History Narrative   Married. Education: college. Exercise: No.   Review of Systems  Constitutional: Negative for unexpected weight change.  Gastrointestinal: Negative for abdominal pain.  Musculoskeletal: Positive for myalgias (chronic).  Psychiatric/Behavioral: Negative for dysphoric mood (managed with therapy).   Objective:  Physical Exam  Constitutional: He appears well-developed and well-nourished. No distress.  HENT:  Head: Normocephalic and atraumatic.  Eyes: Conjunctivae are normal.  Neck: Neck supple.  Cardiovascular: Normal rate, regular rhythm and normal heart sounds.  Exam reveals no gallop and no friction rub.   No murmur heard. Pulmonary/Chest: Effort normal and breath sounds normal. No respiratory distress. He has no wheezes. He has no rales.    Abdominal: There is no tenderness (focal).  Neurological: He is alert.  Skin: Skin is warm, dry and intact.  See diabetic foot exam in [facility]. No wounds on his feet  Psychiatric: He has a normal mood and affect. His behavior is normal.  Nursing note and vitals reviewed.  BP 130/86   Pulse 86   Temp 98.5 F (36.9 C) (Oral)   Resp 16   Ht 6\' 1"  (1.854 m)   Wt (!) 357 lb (161.9 kg)   SpO2 94%   BMI 47.10 kg/m  Assessment & Plan:  FINNIS COLEE is a 54 y.o. male Type 2 diabetes mellitus without complication, without long-term current use of insulin (HCC) - Plan: Microalbumin, urine, Hemoglobin A1C, HM Diabetes Foot Exam  - Previously controlled. Continue metformin 500 mg daily, A1c pending. Urine microalbumin pending.  Essential hypertension - Plan: COMPLETE METABOLIC PANEL WITH GFR  -Improved control on current regimen, continue losartan HCTZ 100/25 mg daily.  Class 3 obesity with body mass index (BMI) of 45.0 to 49.9 in adult, unspecified obesity type, unspecified whether serious comorbidity present (HCC)  - Improving, commended on decreased weight. Continue diet/portion control has difficulty with exercise and chronic leg pain.  Screening for hyperlipidemia - Plan: Lipid panel  Needs flu shot - Plan: Flu Vaccine QUAD 36+ mos IM  -Flu vaccine given.  Depression, followed by psychiatry and therapist. Denies suicidal ideation, symptoms overall similar to previous. Continue follow-up with mental health providers.   No orders of the defined types were placed in this encounter.  Patient Instructions    Blood pressure overall stable level today. No change in medication for now. I will let you know about the labs through my chart. Recheck 3 months. Sooner if needed.      IF you received an x-ray today, you will receive an invoice from [facility]. Please contact [facility] at [phone] with questions or concerns regarding your invoice.   IF you  received labwork today, you will receive an invoice from [facility]. Please contact [facility] at [phone] with questions or concerns regarding your invoice.   Our billing staff will not be able to assist you with questions regarding bills from these companies.  You will be contacted with the lab results as soon as they are available. The fastest way to get your results is to activate your My Chart account. Instructions are located on the last page of this paperwork. If you have not heard from us regarding the results in 2 weeks, please contact this office.        I personally performed the services described in this documentation, which was scribed in my presence. The recorded information has been reviewed and considered, and addended by me as needed.   Signed,   Meredith StaggersJeffrey Bucky Grigg, MD [facility].  06/10/16 8:41 AM

## 2016-06-10 NOTE — Patient Instructions (Signed)
  Blood pressure overall stable level today. No change in medication for now. I will let you know about the labs through my chart. Recheck 3 months. Sooner if needed.      IF you received an x-ray today, you will receive an invoice from [facility]. Please contact [facility] at [phone] with questions or concerns regarding your invoice.   IF you received labwork today, you will receive an invoice from [facility]. Please contact [facility] at [phone] with questions or concerns regarding your invoice.   Our billing staff will not be able to assist you with questions regarding bills from these companies.  You will be contacted with the lab results as soon as they are available. The fastest way to get your results is to activate your My Chart account. Instructions are located on the last page of this paperwork. If you have not heard from us regarding the results in 2 weeks, please contact this office.

## 2016-06-11 LAB — MICROALBUMIN, URINE: Microalb, Ur: 0.6 mg/dL

## 2016-06-11 LAB — HEMOGLOBIN A1C
HEMOGLOBIN A1C: 6.6 % — AB (ref <5.7)
MEAN PLASMA GLUCOSE: 143 mg/dL

## 2016-06-12 ENCOUNTER — Telehealth: Payer: Self-pay

## 2016-06-12 ENCOUNTER — Other Ambulatory Visit: Payer: Self-pay

## 2016-06-12 MED ORDER — SIMVASTATIN 20 MG PO TABS: ORAL_TABLET | ORAL | 0 refills | Status: DC

## 2016-06-12 MED ORDER — GABAPENTIN 300 MG PO CAPS: ORAL_CAPSULE | ORAL | 1 refills | Status: DC

## 2016-06-17 ENCOUNTER — Other Ambulatory Visit: Payer: Self-pay

## 2016-06-17 MED ORDER — SIMVASTATIN 20 MG PO TABS: ORAL_TABLET | ORAL | 0 refills | Status: DC

## 2016-08-24 ENCOUNTER — Ambulatory Visit (INDEPENDENT_AMBULATORY_CARE_PROVIDER_SITE_OTHER): Payer: BLUE CROSS/BLUE SHIELD | Admitting: Psychiatry

## 2016-08-24 ENCOUNTER — Encounter (HOSPITAL_COMMUNITY): Payer: Self-pay | Admitting: Psychiatry

## 2016-08-24 VITALS — BP 132/76 | HR 95 | Ht 74.0 in | Wt 356.0 lb

## 2016-08-24 DIAGNOSIS — Z79899 Other long term (current) drug therapy: Secondary | ICD-10-CM

## 2016-08-24 DIAGNOSIS — Z808 Family history of malignant neoplasm of other organs or systems: Secondary | ICD-10-CM

## 2016-08-24 DIAGNOSIS — Z888 Allergy status to other drugs, medicaments and biological substances status: Secondary | ICD-10-CM

## 2016-08-24 DIAGNOSIS — Z8249 Family history of ischemic heart disease and other diseases of the circulatory system: Secondary | ICD-10-CM

## 2016-08-24 DIAGNOSIS — Z87891 Personal history of nicotine dependence: Secondary | ICD-10-CM

## 2016-08-24 DIAGNOSIS — Z8261 Family history of arthritis: Secondary | ICD-10-CM

## 2016-08-24 DIAGNOSIS — Z7982 Long term (current) use of aspirin: Secondary | ICD-10-CM

## 2016-08-24 DIAGNOSIS — F330 Major depressive disorder, recurrent, mild: Secondary | ICD-10-CM

## 2016-08-24 DIAGNOSIS — Z880 Allergy status to penicillin: Secondary | ICD-10-CM

## 2016-08-24 DIAGNOSIS — Z803 Family history of malignant neoplasm of breast: Secondary | ICD-10-CM

## 2016-08-24 DIAGNOSIS — Z811 Family history of alcohol abuse and dependence: Secondary | ICD-10-CM

## 2016-08-24 DIAGNOSIS — Z8041 Family history of malignant neoplasm of ovary: Secondary | ICD-10-CM | POA: Diagnosis not present

## 2016-08-24 DIAGNOSIS — Z801 Family history of malignant neoplasm of trachea, bronchus and lung: Secondary | ICD-10-CM

## 2016-08-24 DIAGNOSIS — Z9889 Other specified postprocedural states: Secondary | ICD-10-CM

## 2016-08-24 MED ORDER — SERTRALINE HCL 100 MG PO TABS: ORAL_TABLET | ORAL | 0 refills | Status: DC

## 2016-08-24 MED ORDER — HYDROXYZINE PAMOATE 50 MG PO CAPS: ORAL_CAPSULE | ORAL | 0 refills | Status: DC

## 2016-08-24 MED ORDER — TRAZODONE HCL 100 MG PO TABS: ORAL_TABLET | ORAL | 0 refills | Status: DC

## 2016-08-24 NOTE — Progress Notes (Signed)
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  HPI: Matthew Hutchinson came for his follow-up appointment.  He is taking his psychiatric medication and reported no side effects.  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  He sleeping okay.  He denies any irritability or any frustration.  Recently he seen his primary care physician but there were no changes.  He has ankle pain which is slowly gradually getting better.  His last hemoglobin A1c was 6.6.  He has no tremors or shakes.  He like to continue Zoloft trazodone and Vistaril.  He mentioned his depression and mood is a stable but he gets sometimes frustrated with situational but denies any feeling of hopelessness or worthlessness.  His appetite is okay.  His vital signs are stable.  Visit Diagnosis:    ICD-9-CM ICD-10-CM   1. Major depressive disorder, recurrent episode, mild (HCC) 296.31 F33.0     Past Psychiatric History: Reviewed. [facility] taken overdose on Ambien. He has history of taking overdose on Ambien 2 other times but does not require psychiatric inpatient treatment. He admitted history of bad temper mood swing severe anger rage and poor sleep. He endorse history of manic-like symptoms when he gets very impulsive and aggressive. He has history of getting speeding tickets. He denies any psychosis or any hallucination. He was never tried any psychotropic medication other than Xanax and Ambien by his primary care physician.  Past Medical History:  Past Medical History:  Diagnosis Date  . Allergy   . Anxiety   . Arthritis   . COPD (chronic obstructive pulmonary disease) (HCC)   . Depression   . Emphysema of lung (HCC)   . Hyperlipidemia   . Morbid obesity (HCC)   . OSA (obstructive sleep apnea)    uses  CPAP nightly  . PONV (postoperative nausea and vomiting)   . Unspecified essential hypertension     Past Surgical History:  Procedure Laterality Date  . INSERTION OF MESH N/A 09/18/2015   Procedure: INSERTION OF MESH;  Surgeon: Abigail Miyamoto, MD;  Location: [facility];  Service: General;  Laterality: N/A;  . LEG SURGERY Left   . SHOULDER ARTHROSCOPY Left   . TONSILLECTOMY    . UMBILICAL HERNIA REPAIR N/A 09/18/2015   Procedure: HERNIA REPAIR UMBILICAL ADULT WITH MESH ;  Surgeon: Abigail Miyamoto, MD;  Location: [facility];  Service: General;  Laterality: N/A;    Family Psychiatric History: Reviewed.  Family History:  Family History  Problem Relation Age of Onset  . Ovarian cancer Mother   . Breast cancer Mother   . Macular degeneration Mother   . Alcohol abuse Mother   . Arthritis Mother   . Emphysema Paternal Grandfather   . Lung cancer Paternal Uncle   . Cancer - Other Father     gallbladder cancer  . Alcohol abuse Father   . Cancer Maternal Grandmother   . Heart disease Maternal Grandfather   . Emphysema Paternal Grandmother   . Hyperlipidemia Brother   . Hypertension Brother     Social History:  Social History   Social History  . Marital status: Married    Spouse name: N/A  . Number of children: N/A  . 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 Smoking status: Former Smoker    Packs/day: 2.00    Years: 26.00    Types: Cigarettes    Quit date: 08/13/2003  . Smokeless tobacco: Never Used     Comment: 2ppd x 26 years  . Alcohol use No     Comment: social  . Drug use: No  . Sexual activity: Yes    Partners: Male    Birth control/ protection: None   Other Topics Concern  . None   Social History Narrative   Married. Education: college. Exercise: No.    Allergies:  Allergies  Allergen Reactions  . Penicillins Anaphylaxis    REACTION: anaphylaxis  . Valsartan      REACTION: itch/swelling    Metabolic Disorder Labs: Lab Results  Component Value Date   HGBA1C 6.6 (H) 06/10/2016   MPG 143 06/10/2016   MPG 140 03/11/2016   No results found for: PROLACTIN Lab Results  Component Value Date   CHOL 133 06/10/2016   TRIG 111 06/10/2016   HDL 44 06/10/2016   CHOLHDL 3.0 06/10/2016   VLDL 22 06/10/2016   LDLCALC 67 06/10/2016   LDLCALC 67 12/03/2015     Current Medications: Current Outpatient Prescriptions  Medication Sig Dispense Refill  . aspirin 81 MG tablet Take 1 tablet (81 mg total) by mouth daily. 30 tablet 0  . b complex vitamins tablet Take 1 tablet by mouth daily.    . calcium gluconate 500 MG tablet Take 500 mg by mouth daily.    . Cholecalciferol (VITAMIN D) 2000 UNITS tablet Take 1 tablet (2,000 Units total) by mouth daily. 30 tablet 0  . gabapentin (NEURONTIN) 300 MG capsule TAKE 3 BY MOUTH THREE TIMES DAILY 810 capsule 1  . hydrOXYzine (VISTARIL) 50 MG capsule TAKE 2 BY MOUTH AT BEDTIME 180 capsule 0  . losartan-hydrochlorothiazide (HYZAAR) 100-25 MG tablet Take 1 tablet by mouth daily. 90 tablet 1  . Lutein 40 MG CAPS Take 1 capsule by mouth daily.    . magnesium gluconate (MAGONATE) 500 MG tablet Take 500 mg by mouth daily.    . metFORMIN (GLUCOPHAGE) 500 MG tablet Take 1 tablet (500 mg total) by mouth daily with breakfast. 90 tablet 1  . Multiple Vitamin (MULTIVITAMIN) capsule Take 1 capsule by mouth daily. 30 capsule 0  . nitroGLYCERIN (NITROSTAT) 0.4 MG SL tablet Place 1 tablet (0.4 mg total) under the tongue every 5 (five) minutes as needed for chest pain. If chest pain not resolved, after 3 doses 5 minutes apart--call 911 30 tablet 0  . Potassium 99 MG TABS Take 1 tablet by mouth daily.    . sertraline (ZOLOFT) 100 MG tablet TAKE 1 BY MOUTH DAILY 90 tablet 0  . Shark Cartilage 740 MG CAPS Take 1 capsule by mouth 3 (three) times daily.    . simvastatin (ZOCOR) 20 MG tablet TAKE 1 BY MOUTH AT BEDTIME 90 tablet 0  . traMADol  (ULTRAM) 50 MG tablet Take 50 mg by mouth every 6 (six) hours as needed for pain.    . traZODone (DESYREL) 100 MG tablet TAKE 1 BY MOUTH AT BEDTIME 90 tablet 0   No current facility-administered medications for this visit.     Neurologic: Headache: No Seizure: No Paresthesias: neuropathy  Musculoskeletal: Strength & Muscle Tone: within normal limits Gait & Station: unsteady Patient leans: N/A  Psychiatric Specialty Exam: Review of Systems  Constitutional: Negative.   HENT:  Negative.   Musculoskeletal: Positive for joint pain.  Skin: Negative.  Negative for itching and rash.  Neurological: Positive for tingling.    Blood pressure 132/76, pulse 95, height 6\' 2"  (1.88 m), weight (!) 356 lb (161.5 kg), SpO2 97 %.Body mass index is 45.71 kg/m.  General Appearance: obese  Eye Contact:  Good  Speech:  high pitch  Volume:  Increased  Mood:  Anxious  Affect:  Appropriate and Congruent  Thought Process:  Goal Directed  Orientation:  Full (Time, Place, and Person)  Thought Content: WDL and Logical   Suicidal Thoughts:  No  Homicidal Thoughts:  No  Memory:  Immediate;   Good Recent;   Good Remote;   Good  Judgement:  Good  Insight:  Good  Psychomotor Activity:  Normal  Concentration:  Concentration: Good and Attention Span: Good  Recall:  Good  Fund of Knowledge: Good  Language: Good  Akathisia:  No  Handed:  Right  AIMS (if indicated):  0  Assets:  Communication Skills Desire for Improvement Financial Resources/Insurance Housing Resilience  ADL's:  Intact  Cognition: WNL  Sleep:  fair    Assessment: Major depressive disorder, recurrent  Plan: Patient is a stable on his current psychiatric medication.  He does not want to change his current psychiatric medication.  Continue Vistaril 50 mg 1-2 tablet at bedtime as needed, continue trazodone 100 mg at bedtime and Zoloft 100 mg daily.  Discussed medication side effects and benefits.  Recommended to call us back if he  has any question, concern or worsening of the symptom.  Follow-up in 3 months.  Janazia Schreier T., MD 08/24/2016, 2:36 PM

## 2016-09-11 ENCOUNTER — Telehealth: Payer: Self-pay | Admitting: Family Medicine

## 2016-09-11 DIAGNOSIS — I10 Essential (primary) hypertension: Secondary | ICD-10-CM

## 2016-09-11 MED ORDER — LOSARTAN POTASSIUM-HCTZ 100-25 MG PO TABS: 1.0000 | ORAL_TABLET | Freq: Every day | ORAL | 0 refills | Status: DC

## 2016-09-11 NOTE — Telephone Encounter (Signed)
Pt advised sent today needs ov before next refill

## 2016-09-11 NOTE — Telephone Encounter (Signed)
Pt is checking on status of his refill request for losartan hctz 100/25 mg from mail order at [facility] he stated that this has been over a week now   Best number [phone]

## 2016-11-23 ENCOUNTER — Encounter (HOSPITAL_COMMUNITY): Payer: Self-pay | Admitting: Psychiatry

## 2016-11-23 ENCOUNTER — Ambulatory Visit (INDEPENDENT_AMBULATORY_CARE_PROVIDER_SITE_OTHER): Payer: BLUE CROSS/BLUE SHIELD | Admitting: Psychiatry

## 2016-11-23 DIAGNOSIS — Z87891 Personal history of nicotine dependence: Secondary | ICD-10-CM

## 2016-11-23 DIAGNOSIS — Z811 Family history of alcohol abuse and dependence: Secondary | ICD-10-CM

## 2016-11-23 DIAGNOSIS — F330 Major depressive disorder, recurrent, mild: Secondary | ICD-10-CM

## 2016-11-23 MED ORDER — SERTRALINE HCL 100 MG PO TABS: ORAL_TABLET | ORAL | 0 refills | Status: DC

## 2016-11-23 MED ORDER — TRAZODONE HCL 100 MG PO TABS
ORAL_TABLET | ORAL | 0 refills | Status: DC
Start: 2016-11-23 — End: 2017-02-18

## 2016-11-23 MED ORDER — HYDROXYZINE PAMOATE 50 MG PO CAPS: ORAL_CAPSULE | ORAL | 0 refills | Status: DC

## 2016-11-23 NOTE — Progress Notes (Signed)
BH MD/PA/NP OP Progress Note  11/23/2016 3:25 PM Matthew Hutchinson  MRN:  [id number]  Chief Complaint:  Subjective:  I am worried about my business.  It is very slow.  HPI: Terryn came for his follow-up appointment.  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  He has difficulty finding people and hiding people.  However he is handing situation better than he thought.  He has been compliant with medication.  He endorsed that his partner is supportive.  He denies any irritability, anger, mania, psychosis.  He sleeping good.  He denies any suicidal thoughts or homicidal thought.  Sometime he is hopeless about his business but denies any anhedonia or any crying spells.  His energy level is good.  His appetite is okay.  His vital signs are stable.  Visit Diagnosis:    ICD-9-CM ICD-10-CM   1. Major depressive disorder, recurrent episode, mild (HCC) 296.31 F33.0 traZODone (DESYREL) 100 MG tablet     sertraline (ZOLOFT) 100 MG tablet     hydrOXYzine (VISTARIL) 50 MG capsule    Past Psychiatric History: Reviewed. [facility] taken overdose on Ambien. He has history of taking overdose on Ambien 2 other times but does not require psychiatric inpatient treatment. He admitted history of bad temper mood swing severe anger rage and poor sleep. He endorse history of manic-like symptoms when he gets very impulsive and aggressive. He has history of getting speeding tickets. He denies any psychosis or any hallucination. He was never tried any psychotropic medication other than Xanax and Ambien by his primary care physician.  Past Medical History:  Past Medical History:  Diagnosis Date  . Allergy   . Anxiety   . Arthritis   . COPD (chronic obstructive pulmonary disease) (HCC)   . Depression   . Emphysema of lung (HCC)   . Hyperlipidemia   . Morbid obesity (HCC)   . OSA (obstructive sleep apnea)    uses CPAP nightly  . PONV  (postoperative nausea and vomiting)   . Unspecified essential hypertension     Past Surgical History:  Procedure Laterality Date  . INSERTION OF MESH N/A 09/18/2015   Procedure: INSERTION OF MESH;  Surgeon: Abigail Miyamoto, MD;  Location: [facility];  Service: General;  Laterality: N/A;  . LEG SURGERY Left   . SHOULDER ARTHROSCOPY Left   . TONSILLECTOMY    . UMBILICAL HERNIA REPAIR N/A 09/18/2015   Procedure: HERNIA REPAIR UMBILICAL ADULT WITH MESH ;  Surgeon: Abigail Miyamoto, MD;  [facility];  Service: General;  Laterality: N/A;    Family Psychiatric History: Reviewed.  Family History:  Family History  Problem Relation Age of Onset  . Ovarian cancer Mother   . Breast cancer Mother   . Macular degeneration Mother   . Alcohol abuse Mother   . Arthritis Mother   . Emphysema Paternal Grandfather   . Lung cancer Paternal Uncle   . Cancer - Other Father        gallbladder cancer  . Alcohol abuse Father   . Cancer Maternal Grandmother   . Heart disease Maternal Grandfather   . Emphysema Paternal Grandmother   . Hyperlipidemia Brother   . Hypertension Brother     Social History:  Social History   Social History  . Marital status: Married    Spouse name: N/A  . Number of children: N/A  . 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 Smoking status: Former Smoker    Packs/day: 2.00    Years: 26.00    Types: Cigarettes    Quit date: 08/13/2003  . Smokeless tobacco: Never Used     Comment: 2ppd x 26 years  . Alcohol use No     Comment: social  . Drug use: No  . Sexual activity: Yes    Partners: Male    Birth control/ protection: None   Other Topics Concern  . Not on file   Social History Narrative   Married. Education: college. Exercise: No.    Allergies:  Allergies  Allergen Reactions  . Penicillins Anaphylaxis    REACTION: anaphylaxis  . Valsartan     REACTION:  itch/swelling    Metabolic Disorder Labs: Lab Results  Component Value Date   HGBA1C 6.6 (H) 06/10/2016   MPG 143 06/10/2016   MPG 140 03/11/2016   No results found for: PROLACTIN Lab Results  Component Value Date   CHOL 133 06/10/2016   TRIG 111 06/10/2016   HDL 44 06/10/2016   CHOLHDL 3.0 06/10/2016   VLDL 22 06/10/2016   LDLCALC 67 06/10/2016   LDLCALC 67 12/03/2015     Current Medications: Current Outpatient Prescriptions  Medication Sig Dispense Refill  . aspirin 81 MG tablet Take 1 tablet (81 mg total) by mouth daily. 30 tablet 0  . b complex vitamins tablet Take 1 tablet by mouth daily.    . calcium gluconate 500 MG tablet Take 500 mg by mouth daily.    . Cholecalciferol (VITAMIN D) 2000 UNITS tablet Take 1 tablet (2,000 Units total) by mouth daily. 30 tablet 0  . gabapentin (NEURONTIN) 300 MG capsule TAKE 3 BY MOUTH THREE TIMES DAILY 810 capsule 1  . hydrOXYzine (VISTARIL) 50 MG capsule TAKE 2 BY MOUTH AT BEDTIME 180 capsule 0  . losartan-hydrochlorothiazide (HYZAAR) 100-25 MG tablet Take 1 tablet by mouth daily. 90 tablet 0  . Lutein 40 MG CAPS Take 1 capsule by mouth daily.    . magnesium gluconate (MAGONATE) 500 MG tablet Take 500 mg by mouth daily.    . metFORMIN (GLUCOPHAGE) 500 MG tablet Take 1 tablet (500 mg total) by mouth daily with breakfast. 90 tablet 1  . Multiple Vitamin (MULTIVITAMIN) capsule Take 1 capsule by mouth daily. 30 capsule 0  . nitroGLYCERIN (NITROSTAT) 0.4 MG SL tablet Place 1 tablet (0.4 mg total) under the tongue every 5 (five) minutes as needed for chest pain. If chest pain not resolved, after 3 doses 5 minutes apart--call 911 30 tablet 0  . Potassium 99 MG TABS Take 1 tablet by mouth daily.    . sertraline (ZOLOFT) 100 MG tablet TAKE 1 BY MOUTH DAILY 90 tablet 0  . Shark Cartilage 740 MG CAPS Take 1 capsule by mouth 3 (three) times daily.    . simvastatin (ZOCOR) 20 MG tablet TAKE 1 BY MOUTH AT BEDTIME 90 tablet 0  . traMADol (ULTRAM) 50  MG tablet Take 50 mg by mouth every 6 (six) hours as needed for pain.    . traZODone (DESYREL) 100 MG tablet TAKE 1 BY MOUTH AT BEDTIME 90 tablet 0   No current facility-administered medications for this visit.     Neurologic: Headache: No Seizure: No Paresthesias: No  Musculoskeletal: Strength & Muscle Tone: within normal limits Gait & Station: normal Patient leans: N/A  Psychiatric Specialty Exam: ROS  Blood pressure 140/90, pulse 100, height 6' 2.5" (1.892  m), weight (!) 355 lb 6.4 oz (161.2 kg).Body mass index is 45.02 kg/m.  General Appearance: Casual  Eye Contact:  Good  Speech:  Clear and Coherent and high pitch  Volume:  Normal  Mood:  Anxious  Affect:  Congruent  Thought Process:  Goal Directed  Orientation:  Full (Time, Place, and Person)  Thought Content: WDL and Logical   Suicidal Thoughts:  No  Homicidal Thoughts:  No  Memory:  Immediate;   Good Recent;   Good Remote;   Good  Judgement:  Good  Insight:  Good  Psychomotor Activity:  Normal  Concentration:  Concentration: Good and Attention Span: Good  Recall:  Good  Fund of Knowledge: Good  Language: Good  Akathisia:  No  Handed:  Right  AIMS (if indicated):  0  Assets:  Communication Skills Desire for Improvement Housing Social Support  ADL's:  Intact  Cognition: WNL  Sleep:  fair    Assessment: Major depressive disorder, recurrent.  Plan: Reassurance given.  Vision does not want to change his medication.  Continue Vistaril 50 mg 1-2 tablet at bedtime as needed, trazodone 100 mg at bedtime and Zoloft 1 mg daily.  Patient has no tremors shakes or any EPS.  Discussed meditation side effects and benefits.  Recommended to call us back if he has any question, concern or if he feel worsening of the symptom.  Follow-up in 3 months.   Lashannon Bresnan T., MD 11/23/2016, 3:25 PM

## 2016-12-02 ENCOUNTER — Encounter: Payer: Self-pay | Admitting: Pulmonary Disease

## 2016-12-03 ENCOUNTER — Other Ambulatory Visit: Payer: Self-pay | Admitting: Family Medicine

## 2016-12-03 DIAGNOSIS — E119 Type 2 diabetes mellitus without complications: Secondary | ICD-10-CM

## 2016-12-09 DIAGNOSIS — G4733 Obstructive sleep apnea (adult) (pediatric): Secondary | ICD-10-CM | POA: Diagnosis not present

## 2016-12-17 ENCOUNTER — Other Ambulatory Visit: Payer: Self-pay | Admitting: Family Medicine

## 2016-12-17 ENCOUNTER — Other Ambulatory Visit: Payer: Self-pay | Admitting: Physician Assistant

## 2016-12-17 DIAGNOSIS — I10 Essential (primary) hypertension: Secondary | ICD-10-CM

## 2016-12-18 ENCOUNTER — Encounter: Payer: Self-pay | Admitting: Family Medicine

## 2016-12-18 ENCOUNTER — Ambulatory Visit (INDEPENDENT_AMBULATORY_CARE_PROVIDER_SITE_OTHER): Payer: BLUE CROSS/BLUE SHIELD | Admitting: Family Medicine

## 2016-12-18 VITALS — BP 132/77 | HR 75 | Temp 98.6°F | Resp 16 | Ht 73.03 in | Wt 354.6 lb

## 2016-12-18 DIAGNOSIS — E669 Obesity, unspecified: Secondary | ICD-10-CM

## 2016-12-18 DIAGNOSIS — E119 Type 2 diabetes mellitus without complications: Secondary | ICD-10-CM | POA: Diagnosis not present

## 2016-12-18 DIAGNOSIS — E785 Hyperlipidemia, unspecified: Secondary | ICD-10-CM

## 2016-12-18 DIAGNOSIS — M79605 Pain in left leg: Secondary | ICD-10-CM | POA: Diagnosis not present

## 2016-12-18 DIAGNOSIS — Z6841 Body Mass Index (BMI) 40.0 and over, adult: Secondary | ICD-10-CM

## 2016-12-18 DIAGNOSIS — I10 Essential (primary) hypertension: Secondary | ICD-10-CM

## 2016-12-18 DIAGNOSIS — F330 Major depressive disorder, recurrent, mild: Secondary | ICD-10-CM | POA: Diagnosis not present

## 2016-12-18 DIAGNOSIS — IMO0001 Reserved for inherently not codable concepts without codable children: Secondary | ICD-10-CM

## 2016-12-18 DIAGNOSIS — G8929 Other chronic pain: Secondary | ICD-10-CM

## 2016-12-18 MED ORDER — LOSARTAN POTASSIUM-HCTZ 100-25 MG PO TABS: 1.0000 | ORAL_TABLET | Freq: Every day | ORAL | 2 refills | Status: DC

## 2016-12-18 MED ORDER — METFORMIN HCL 500 MG PO TABS: 500.0000 mg | ORAL_TABLET | Freq: Every day | ORAL | 2 refills | Status: DC

## 2016-12-18 MED ORDER — GABAPENTIN 300 MG PO CAPS: ORAL_CAPSULE | ORAL | 2 refills | Status: DC

## 2016-12-18 MED ORDER — SIMVASTATIN 20 MG PO TABS: ORAL_TABLET | ORAL | 2 refills | Status: DC

## 2016-12-18 NOTE — Patient Instructions (Addendum)
No change in medications for now. Follow-up in 6 months for diabetes, and refill medications. Let me know if you have questions in the meantime.  Try Aveeno lotion or Sarna over the counter to see if that will help at bedtime to lessen itching of your leg.  Return to the clinic or go to the nearest emergency room if any of your symptoms worsen or new symptoms occur.   IF you received an x-ray today, you will [facility]. Please contact [facility] at [phone] with questions or concerns regarding your invoice.   IF you received labwork today, you will receive an invoice from [facility]. Please contact [facility] at [phone] with questions or concerns regarding your invoice.   Our billing staff will not be able to assist you with questions regarding bills from these companies.  You will be contacted with the lab results as soon as they are available. The fastest way to get your results is to activate your My Chart account. Instructions are located on the last page of this paperwork. If you have not heard from us regarding the results in 2 weeks, please contact this office.

## 2016-12-18 NOTE — Progress Notes (Signed)
By signing my name below, I, [name], attest that this documentation has been prepared under the direction and in the presence of [name], MD.  Electronically Signed: [name], Medical Scribe. 12/18/16. 8:16 AM.  Subjective:    Patient ID: Matthew Hutchinson, male    DOB: [birth year], 56 y.o.   MRN: [id number]  HPI Chief Complaint  Patient presents with  . Diabetes    med refill for all medication.   . Hyperlipidemia  . Hypertension  . Depression    HPI Comments: Matthew Hutchinson is a 56 y.o. male who presents to Primary Care at [facility] for medication refill. Denies experiencing any changes.  DM: Continued on metformin 500 mg QD. He is on ASA 81 mg QD, and zocor. Pt is compliant with metformin. Pt last saw his ophthalmologist, Dr.Shapiro, 3 weeks ago. No retinopathy or diabetic changes, but early onset cataracts from last year. Pt doesn't have any natural teeth, so he does see a dentist. Pt is not getting Denies experiencing negative side effects with metformin. Lab Results  Component Value Date   HGBA1C 6.6 (H) 06/10/2016   Lab Results  Component Value Date   MICROALBUR 0.6 06/10/2016   Obesity: He has a history of chronic left leg pain that limits exercise but has made some diet changes in the past with some weight loss. Pt takes gabapentin 900 mg TID for his left leg pain and he was considering getting off of it completely in the next 1.5 years for potential weight loss. Pt has already cut back on gabapentin and he hasn't noticed any diaphoresis, dizziness, or other potential side effects from getting off of it. Pt is still on his diet and would like to get a less invasive procedure for weight loss - waiting to see what comes out with time. Reports his family has always struggled with weight loss. Wt Readings from Last 3 Encounters:  12/18/16 (!) 354 lb 9.6 oz (160.8 kg)  06/10/16 (!) 357 lb (161.9 kg)  03/11/16 (!) 363 lb (164.7 kg)  Body mass  index is 46.74 kg/m.  HLD: He was tolerating zocor 20 mg QHS. Compliant with zocor. Denies myalgias, arthralgias, or other negative side effects.  Lab Results  Component Value Date   CHOL 133 06/10/2016   HDL 44 06/10/2016   LDLCALC 67 06/10/2016   TRIG 111 06/10/2016   CHOLHDL 3.0 06/10/2016   Lab Results  Component Value Date   ALT 33 06/10/2016   AST 32 06/10/2016   ALKPHOS 75 06/10/2016   BILITOT 0.7 06/10/2016   HTN: He takes losartan-HCTZ 100/25 mg QD. Pt is complaint with losartan-HCTZ. Pt's bp is normally around 140/70. Denies experiencing chest pain, SOB, light-headedness negative side effects. Lab Results  Component Value Date   CREATININE 1.00 06/10/2016   BP Readings from Last 3 Encounters:  12/18/16 132/77  06/10/16 130/86  03/11/16 (!) 166/84   Depression: He is followed by psychiatry and a therapist. zoloft 100 mg QD, trazodone 100 mg QHS Depression screen [facility] 2/9 12/18/2016 06/10/2016 03/11/2016 12/03/2015 06/30/2015  Decreased Interest 0 1 0 0 0  Down, Depressed, Hopeless 0 2 0 0 0  PHQ - 2 Score 0 3 0 0 0  Altered sleeping - 3 - - -  Tired, decreased energy - 2 - - -  Change in appetite - 0 - - -  Feeling bad or failure about yourself  - 0 - - -  Trouble concentrating - 3 - - -  Moving slowly or fidgety/restless - 2 - - -  Suicidal thoughts - 0 - - -  PHQ-9 Score - 13 - - -  Difficult doing work/chores - Somewhat difficult - - -   Immunizations: Pt would like to get his flu shot this upcoming fall.  Immunization History  Administered Date(s) Administered  . Hepatitis B 10/02/2010, 11/02/2010, 03/08/2011  . Influenza Split 04/11/2012  . Influenza Whole 05/12/2010  . Influenza,inj,Quad PF,36+ Mos 04/30/2013, 05/23/2014, 06/02/2015, 06/10/2016  . Pneumococcal Conjugate-13 06/02/2015  . Pneumococcal Polysaccharide-23 02/17/2009, 05/12/2010  . Td 08/27/2004  . Tdap 03/06/2015   Itchy Rash: Located on his right lower leg onset a year. Reports  scratching at it when he sleeps. He uses a older rx'ed cream on it every other day for little relief of his sxs. Cream was rx'ed here.  Patient Active Problem List   Diagnosis Date Noted  . Chronic pain due to trauma 06/25/2013  . Depressive disorder, not elsewhere classified 11/09/2012  . CAD (coronary artery disease) 02/21/2012  . Lipid disorder 02/21/2012  . Angina pectoris 09/27/2011  . Obstructive sleep apnea 05/20/2010  . Essential hypertension 04/17/2010  . EMPHYSEMA 04/17/2010  . WEIGHT GAIN, ABNORMAL 04/17/2010   Past Medical History:  Diagnosis Date  . Allergy   . Anxiety   . Arthritis   . COPD (chronic obstructive pulmonary disease) (HCC)   . Depression   . Emphysema of lung (HCC)   . Hyperlipidemia   . Morbid obesity (HCC)   . OSA (obstructive sleep apnea)    uses CPAP nightly  . PONV (postoperative nausea and vomiting)   . Unspecified essential hypertension    Past Surgical History:  Procedure Laterality Date  . INSERTION OF MESH N/A 09/18/2015   Procedure: INSERTION OF MESH;  Surgeon: Abigail Miyamoto, MD;  Location: [facility];  Service: General;  Laterality: N/A;  . LEG SURGERY Left   . SHOULDER ARTHROSCOPY Left   . TONSILLECTOMY    . UMBILICAL HERNIA REPAIR N/A 09/18/2015   Procedure: HERNIA REPAIR UMBILICAL ADULT WITH MESH ;  Surgeon: Abigail Miyamoto, MD;  Location: [facility];  Service: General;  Laterality: N/A;   Allergies  Allergen Reactions  . Penicillins Anaphylaxis    REACTION: anaphylaxis  . Valsartan     REACTION: itch/swelling   Prior to Admission medications   Medication Sig Start Date End Date Taking? Authorizing Provider  aspirin 81 MG tablet Take 1 tablet (81 mg total) by mouth daily. 11/13/12  Yes [name], NP  b complex vitamins tablet Take 1 tablet by mouth daily.   Yes [provider]  calcium gluconate 500 MG tablet Take 500 mg by mouth daily.   Yes [provider]    Cholecalciferol (VITAMIN D) 2000 UNITS tablet Take 1 tablet (2,000 Units total) by mouth daily. 11/13/12  Yes [name], NP  gabapentin (NEURONTIN) 300 MG capsule TAKE 3 BY MOUTH THREE TIMES DAILY 06/12/16  Yes Shade Flood, MD  hydrOXYzine (VISTARIL) 50 MG capsule TAKE 2 BY MOUTH AT BEDTIME 11/23/16  Yes Arfeen, Phillips Grout, MD  losartan-hydrochlorothiazide (HYZAAR) 100-25 MG tablet Take 1 tablet by mouth daily. 09/11/16  Yes Jeffery, Chelle, PA-C  Lutein 40 MG CAPS Take 1 capsule by mouth daily.   Yes [provider]  magnesium gluconate (MAGONATE) 500 MG tablet Take 500 mg by mouth daily.   Yes [provider]  metFORMIN (GLUCOPHAGE) 500 MG tablet TAKE 1 TABLET BY MOUTH DAILY  WITH BREAKFAST 12/06/16  Yes Shade Flood, MD  Multiple Vitamin (MULTIVITAMIN) capsule Take 1 capsule by mouth daily. 11/13/12  Yes Lord, Herminio Heads, NP  nitroGLYCERIN (NITROSTAT) 0.4 MG SL tablet Place 1 tablet (0.4 mg total) under the tongue every 5 (five) minutes as needed for chest pain. If chest pain not resolved, after 3 doses 5 minutes apart--call 911 11/13/12  Yes [name], NP  Potassium 99 MG TABS Take 1 tablet by mouth daily.   Yes [provider]  sertraline (ZOLOFT) 100 MG tablet TAKE 1 BY MOUTH DAILY 11/23/16  Yes Arfeen, Phillips Grout, MD  Shark Cartilage 740 MG CAPS Take 1 capsule by mouth 3 (three) times daily.   Yes [provider]  simvastatin (ZOCOR) 20 MG tablet TAKE 1 BY MOUTH AT BEDTIME 06/17/16  Yes Shade Flood, MD  traMADol (ULTRAM) 50 MG tablet Take 50 mg by mouth every 6 (six) hours as needed for pain.   Yes [provider]  traZODone (DESYREL) 100 MG tablet TAKE 1 BY MOUTH AT BEDTIME 11/23/16  Yes Arfeen, Phillips Grout, MD   Social History   Social History  . Marital status: Married    Spouse name: N/A  . Number of children: N/A  . 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 Smoking status:  Former Smoker    Packs/day: 2.00    Years: 26.00    Types: Cigarettes    Quit date: 08/13/2003  . Smokeless tobacco: Never Used     Comment: 2ppd x 26 years  . Alcohol use No     Comment: social  . Drug use: No  . Sexual activity: Yes    Partners: Male    Birth control/ protection: None   Other Topics Concern  . Not on file   Social History Narrative   Married. Education: college. Exercise: No.   Review of Systems  Constitutional: Negative for diaphoresis and unexpected weight change.  Respiratory: Negative for shortness of breath.   Cardiovascular: Negative for chest pain.  Musculoskeletal: Negative for arthralgias and myalgias.  Skin: Positive for rash.  Neurological: Negative for dizziness and light-headedness.   Objective:  Physical Exam  Constitutional: He appears well-developed and well-nourished. No distress.  HENT:  Head: Normocephalic and atraumatic.  Eyes: Conjunctivae are normal.  Neck: Neck supple.  Cardiovascular: Normal rate, regular rhythm and normal heart sounds.  Exam reveals no gallop and no friction rub.   No murmur heard. Pulmonary/Chest: Effort normal and breath sounds normal. No respiratory distress. He has no wheezes. He has no rales.  Neurological: He is alert.  Skin: Skin is warm and dry.  Excoriation right lower extremity.  Psychiatric: He has a normal mood and affect. His behavior is normal.  Nursing note and vitals reviewed.   Vitals:   12/18/16 0807  BP: 132/77  Pulse: 75  Resp: 16  Temp: 98.6 F (37 C)  TempSrc: Oral  SpO2: 94%  Weight: (!) 354 lb 9.6 oz (160.8 kg)  Height: 6' 1.03" (1.855 m)   Body mass index is 46.74 kg/m. Assessment & Plan:   ANIS DEGIDIO is a 56 y.o. male Type 2 diabetes mellitus without complication, without long-term current use of insulin (HCC) - Plan: metFORMIN (GLUCOPHAGE) 500 MG tablet  - Controlled prior. Tolerating metformin. Continue same dose, continue to watch diet work on weight  loss.  Major depressive disorder, recurrent episode, mild (HCC)  -  Followed by psychiatry.  Essential hypertension - Plan: losartan-hydrochlorothiazide (HYZAAR) 100-25 MG tablet, Comprehensive metabolic panel  - Stable, near goal of 130/80 or below. RTC precautions if higher readings. Continue same dose of losartan HCTZ.   Hyperlipidemia, unspecified hyperlipidemia type - Plan: simvastatin (ZOCOR) 20 MG tablet, Comprehensive metabolic panel, Lipid panel  - Stable prior with goal LDL less than 70. Continue Zocor 20 mg daily, labs pending  Class 3 obesity with serious comorbidity and body mass index (BMI) of 45.0 to 49.9 in adult, unspecified obesity type (HCC)  - Difficulty with exercise due to chronic lower sternal pain. Briefly discussed surgical options, deferred at this time until less invasive procedures. Commended on weight loss and continue to watch diet.  Chronic pain of left lower extremity - Plan: gabapentin (NEURONTIN) 300 MG capsule  - Stable. Self titrating down on Neurontin dose.  Meds ordered this encounter  Medications  . gabapentin (NEURONTIN) 300 MG capsule    Sig: TAKE 3 BY MOUTH THREE TIMES DAILY    Dispense:  810 capsule    Refill:  2  . losartan-hydrochlorothiazide (HYZAAR) 100-25 MG tablet    Sig: Take 1 tablet by mouth daily.    Dispense:  90 tablet    Refill:  2  . metFORMIN (GLUCOPHAGE) 500 MG tablet    Sig: Take 1 tablet (500 mg total) by mouth daily with breakfast.    Dispense:  90 tablet    Refill:  2  . simvastatin (ZOCOR) 20 MG tablet    Sig: TAKE 1 BY MOUTH AT BEDTIME    Dispense:  90 tablet    Refill:  2   Patient Instructions   No change in medications for now. Follow-up in 6 months for diabetes, and refill medications. Let me know if you have questions in the meantime.  Try Aveeno lotion or Sarna over the counter to see if that will help at bedtime to lessen itching of your leg.  Return to the clinic or go to the nearest emergency room if  any of your symptoms worsen or new symptoms occur.   IF you received an x-ray today, you will receive an invoice from [facility]. Please contact [facility] at [phone] with questions or concerns regarding your invoice.   IF you received labwork today, you will receive an invoice from [facility]. Please contact [facility] at [phone] with questions or concerns regarding your invoice.   Our billing staff will not be able to assist you with questions regarding bills from these companies.  You will be contacted with the lab results as soon as they are available. The fastest way to get your results is to activate your My Chart account. Instructions are located on the last page of this paperwork. If you have not heard from us regarding the results in 2 weeks, please contact this office.       I personally performed the services described in this documentation, which was scribed in my presence. The recorded information has been reviewed and considered for accuracy and completeness, addended by me as needed, and agree with information above.  Signed,   Meredith StaggersJeffrey Delani Kohli, MD Primary Care at [facility].  12/18/16 8:37 AM

## 2016-12-19 LAB — SPECIMEN STATUS

## 2016-12-20 LAB — COMPREHENSIVE METABOLIC PANEL
A/G RATIO: 1.7 (ref 1.2–2.2)
ALBUMIN: 4.2 g/dL (ref 3.5–5.5)
ALT: 33 IU/L (ref 0–44)
AST: 30 IU/L (ref 0–40)
Alkaline Phosphatase: 87 IU/L (ref 39–117)
BILIRUBIN TOTAL: 0.6 mg/dL (ref 0.0–1.2)
BUN / CREAT RATIO: 9 (ref 9–20)
BUN: 10 mg/dL (ref 6–24)
CHLORIDE: 103 mmol/L (ref 96–106)
CO2: 21 mmol/L (ref 18–29)
Calcium: 9.3 mg/dL (ref 8.7–10.2)
Creatinine, Ser: 1.08 mg/dL (ref 0.76–1.27)
GFR calc non Af Amer: 77 mL/min/{1.73_m2} (ref >59)
GFR, EST AFRICAN AMERICAN: 89 mL/min/{1.73_m2} (ref >59)
Globulin, Total: 2.5 g/dL (ref 1.5–4.5)
Glucose: 138 mg/dL — ABNORMAL HIGH (ref 65–99)
POTASSIUM: 4.1 mmol/L (ref 3.5–5.2)
Sodium: 139 mmol/L (ref 134–144)
TOTAL PROTEIN: 6.7 g/dL (ref 6.0–8.5)

## 2016-12-20 LAB — LIPID PANEL
Chol/HDL Ratio: 3.2 ratio (ref 0.0–5.0)
Cholesterol, Total: 134 mg/dL (ref 100–199)
HDL: 42 mg/dL (ref >39)
LDL Calculated: 71 mg/dL (ref 0–99)
Triglycerides: 106 mg/dL (ref 0–149)
VLDL Cholesterol Cal: 21 mg/dL (ref 5–40)

## 2016-12-21 ENCOUNTER — Telehealth: Payer: Self-pay | Admitting: Pulmonary Disease

## 2016-12-21 NOTE — Telephone Encounter (Signed)
Per RA, D/L shows good control on 8cm and good usage.

## 2017-02-02 ENCOUNTER — Encounter: Payer: Self-pay | Admitting: Adult Health

## 2017-02-02 ENCOUNTER — Ambulatory Visit (INDEPENDENT_AMBULATORY_CARE_PROVIDER_SITE_OTHER): Payer: BLUE CROSS/BLUE SHIELD | Admitting: Adult Health

## 2017-02-02 DIAGNOSIS — G4733 Obstructive sleep apnea (adult) (pediatric): Secondary | ICD-10-CM

## 2017-02-02 NOTE — Assessment & Plan Note (Signed)
Wt loss  

## 2017-02-02 NOTE — Patient Instructions (Signed)
Continue on C Pap at bedtime. Keep up the good work. Work on weight loss Do not drive if sleepy Follow-up in one year with Dr. Vassie LollAlva as needed

## 2017-02-02 NOTE — Assessment & Plan Note (Signed)
Well controlled on CPAP   Plan  Patient Instructions  Continue on C Pap at bedtime. Keep up the good work. Work on weight loss Do not drive if sleepy Follow-up in one year with Dr. Vassie LollAlva as needed

## 2017-02-02 NOTE — Progress Notes (Signed)
 @Patient  ID: Matthew Hutchinson, male    DOB: 03/29/1961, 56 y.o.   MRN: [id number]  Chief Complaint  Patient presents with  . Follow-up    OSA    Referring provider: Shade FloodGreene, Matthew R, MD  HPI: 56 year old male followed for moderate sleep apnea  TEST  Spirometry/2014 showed a ratio of 77 FEV1 of 69% NPSG 2010:  AHI 15/hr Auto titration 07/2010:  Optimal pressure 8cm  02/02/2017 Follow up : OSA  Patient returns for one-year follow-up. Patient says he's doing well on C Pap at bedtime. Patient feels rested with no significant daytime sleepiness. He never misses a night .  Download in May 2018 showed good compliance and control on 8 cm H20 Discussed weight loss.   Allergies  Allergen Reactions  . Penicillins Anaphylaxis    REACTION: anaphylaxis  . Valsartan     REACTION: itch/swelling    Immunization History  Administered Date(s) Administered  . Hepatitis B 10/02/2010, 11/02/2010, 03/08/2011  . Influenza Split 04/11/2012  . Influenza Whole 05/12/2010  . Influenza,inj,Quad PF,36+ Mos 04/30/2013, 05/23/2014, 06/02/2015, 06/10/2016  . Pneumococcal Conjugate-13 06/02/2015  . Pneumococcal Polysaccharide-23 02/17/2009, 05/12/2010  . Td 08/27/2004  . Tdap 03/06/2015    Past Medical History:  Diagnosis Date  . Allergy   . Anxiety   . Arthritis   . COPD (chronic obstructive pulmonary disease) (HCC)   . Depression   . Emphysema of lung (HCC)   . Hyperlipidemia   . Morbid obesity (HCC)   . OSA (obstructive sleep apnea)    uses CPAP nightly  . PONV (postoperative nausea and vomiting)   . Unspecified essential hypertension     Tobacco History: History  Smoking Status  . Former Smoker  . Packs/day: 2.00  . Years: 26.00  . Types: Cigarettes  . Quit date: 08/13/2003  Smokeless Tobacco  . Never Used    Comment: 2ppd x 26 years   Counseling given: Not Answered   Outpatient Encounter Prescriptions as of 02/02/2017  Medication Sig  . aspirin 81 MG tablet Take 1  tablet (81 mg total) by mouth daily.  Marland Kitchen. b complex vitamins tablet Take 1 tablet by mouth daily.  . calcium gluconate 500 MG tablet Take 500 mg by mouth daily.  . Cholecalciferol (VITAMIN D) 2000 UNITS tablet Take 1 tablet (2,000 Units total) by mouth daily.  Marland Kitchen. gabapentin (NEURONTIN) 300 MG capsule TAKE 3 BY MOUTH THREE TIMES DAILY  . hydrOXYzine (VISTARIL) 50 MG capsule TAKE 2 BY MOUTH AT BEDTIME  . losartan-hydrochlorothiazide (HYZAAR) 100-25 MG tablet Take 1 tablet by mouth daily.  . Lutein 40 MG CAPS Take 1 capsule by mouth daily.  . magnesium gluconate (MAGONATE) 500 MG tablet Take 500 mg by mouth daily.  . metFORMIN (GLUCOPHAGE) 500 MG tablet Take 1 tablet (500 mg total) by mouth daily with breakfast.  . Multiple Vitamin (MULTIVITAMIN) capsule Take 1 capsule by mouth daily.  . nitroGLYCERIN (NITROSTAT) 0.4 MG SL tablet Place 1 tablet (0.4 mg total) under the tongue every 5 (five) minutes as needed for chest pain. If chest pain not resolved, after 3 doses 5 minutes apart--call 911  . Potassium 99 MG TABS Take 1 tablet by mouth daily.  . sertraline (ZOLOFT) 100 MG tablet TAKE 1 BY MOUTH DAILY  . Shark Cartilage 740 MG CAPS Take 1 capsule by mouth 3 (three) times daily.  . simvastatin (ZOCOR) 20 MG tablet TAKE 1 BY MOUTH AT BEDTIME  . traMADol (ULTRAM) 50 MG tablet Take 50 mg by  mouth every 6 (six) hours as needed for pain.  . traZODone (DESYREL) 100 MG tablet TAKE 1 BY MOUTH AT BEDTIME   No facility-administered encounter medications on file as of 02/02/2017.      Review of Systems  Constitutional:   No  weight loss, night sweats,  Fevers, chills, fatigue, or  lassitude.  HEENT:   No headaches,  Difficulty swallowing,  Tooth/dental problems, or  Sore throat,                No sneezing, itching, ear ache, nasal congestion, post nasal drip,   CV:  No chest pain,  Orthopnea, PND, swelling in lower extremities, anasarca, dizziness, palpitations, syncope.   GI  No heartburn, indigestion,  abdominal pain, nausea, vomiting, diarrhea, change in bowel habits, loss of appetite, bloody stools.   Resp: No shortness of breath with exertion or at rest.  No excess mucus, no productive cough,  No non-productive cough,  No coughing up of blood.  No change in color of mucus.  No wheezing.  No chest wall deformity  Skin: no rash or lesions.  GU: no dysuria, change in color of urine, no urgency or frequency.  No flank pain, no hematuria   MS:  No joint pain or swelling.  No decreased range of motion.  No back pain.    Physical Exam  BP 132/84 (BP Location: Left Arm, Cuff Size: Large)   Pulse 91   Ht 6\' 2"  (1.88 m)   Wt (!) 354 lb 12.8 oz (160.9 kg)   SpO2 96%   BMI 45.55 kg/m   GEN: A/Ox3; pleasant , NAD, obese    HEENT:  Matthew Hutchinson/AT,  EACs-clear, TMs-wnl, NOSE-clear, THROAT-clear, no lesions, no postnasal drip or exudate noted. Class 3 MP airway   NECK:  Supple w/ fair ROM; no JVD; normal carotid impulses w/o bruits; no thyromegaly or nodules palpated; no lymphadenopathy.    RESP  Clear  P & A; w/o, wheezes/ rales/ or rhonchi. no accessory muscle use, no dullness to percussion  CARD:  RRR, no m/Hutchinson/g, no peripheral edema, pulses intact, no cyanosis or clubbing.  GI:   Soft & nt; nml bowel sounds; no organomegaly or masses detected.   Musco: Warm bil, no deformities or joint swelling noted.   Neuro: alert, no focal deficits noted.    Skin: Warm, no lesions or rashes   Lab Results:  CBC    BNP No results found for: BNP  ProBNP No results found for: PROBNP  Imaging: No results found.   Assessment & Plan:   Obstructive sleep apnea Well controlled on CPAP   Plan  Patient Instructions  Continue on C Pap at bedtime. Keep up the good work. Work on weight loss Do not drive if sleepy Follow-up in one year with Dr. Vassie LollAlva as needed    Morbid obesity [facility](HCC) Wt loss      Matthew Oaksammy Faith Branan, NP 02/02/2017

## 2017-02-14 ENCOUNTER — Other Ambulatory Visit (HOSPITAL_COMMUNITY): Payer: Self-pay | Admitting: Psychiatry

## 2017-02-14 DIAGNOSIS — F330 Major depressive disorder, recurrent, mild: Secondary | ICD-10-CM

## 2017-02-17 ENCOUNTER — Telehealth (HOSPITAL_COMMUNITY): Payer: Self-pay

## 2017-02-17 DIAGNOSIS — F330 Major depressive disorder, recurrent, mild: Secondary | ICD-10-CM

## 2017-02-17 NOTE — Telephone Encounter (Signed)
Patient will run out of his prescriptions a few days before his appointment - he needs 90 day supplies sent to [facility] (mail order) please review and advise, thank you

## 2017-02-18 MED ORDER — TRAZODONE HCL 100 MG PO TABS: ORAL_TABLET | ORAL | 0 refills | Status: DC

## 2017-02-18 MED ORDER — SERTRALINE HCL 100 MG PO TABS: ORAL_TABLET | ORAL | 0 refills | Status: DC

## 2017-02-18 MED ORDER — HYDROXYZINE PAMOATE 50 MG PO CAPS: ORAL_CAPSULE | ORAL | 0 refills | Status: DC

## 2017-02-18 NOTE — Telephone Encounter (Signed)
Okay to refill? 

## 2017-02-18 NOTE — Telephone Encounter (Signed)
I sent the refills in and called patient to let him know

## 2017-02-24 ENCOUNTER — Encounter (HOSPITAL_COMMUNITY): Payer: Self-pay | Admitting: Psychiatry

## 2017-02-24 ENCOUNTER — Ambulatory Visit (INDEPENDENT_AMBULATORY_CARE_PROVIDER_SITE_OTHER): Payer: BLUE CROSS/BLUE SHIELD | Admitting: Psychiatry

## 2017-02-24 DIAGNOSIS — F330 Major depressive disorder, recurrent, mild: Secondary | ICD-10-CM | POA: Diagnosis not present

## 2017-02-24 DIAGNOSIS — Z811 Family history of alcohol abuse and dependence: Secondary | ICD-10-CM | POA: Diagnosis not present

## 2017-02-24 DIAGNOSIS — Z87891 Personal history of nicotine dependence: Secondary | ICD-10-CM | POA: Diagnosis not present

## 2017-02-24 DIAGNOSIS — Z5689 Other problems related to employment: Secondary | ICD-10-CM | POA: Diagnosis not present

## 2017-02-24 DIAGNOSIS — F419 Anxiety disorder, unspecified: Secondary | ICD-10-CM

## 2017-02-24 NOTE — Progress Notes (Addendum)
BH MD/PA/NP OP Progress Note  02/24/2017 3:27 PM Matthew Hutchinson  MRN:  [id number]  Chief Complaint:  Subjective:  I am more anxious.  HPI: Matthew Hutchinson came for his follow-up appointment.  He's been feeling more anxious nervous and having poor sleep.  He is more scared and nervous at public places.  He is not happy with the political situation and even thinking to move [place] situation get worse with his partner.  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  He feels regret and guilty letting her go but he had no other choice.  He is afraid that business is shrinking.  He continues to have issues with a coworker and sometime he gets very upset irritable but denies any aggressive behavior, mania or psychosis.  He denies any suicidal thoughts.  He is taking Zoloft trazodone and Vistaril.  He cut down his, pending hoping to lose weight.  He takes gabapentin for chronic pain.  He's taking Vistaril 50 mg to 150 mg depending how tired he is.  His partner is supportive.  He has no rash, tremors itching or any EPS.  He denies any crying spells.  Energy level is fair.  His appetite is okay.  Recently he seen his primary care physician.  Patient denies drinking alcohol or using any illegal substances.  Visit Diagnosis:    ICD-10-CM   1. Major depressive disorder, recurrent episode, mild (HCC) F33.0     Past Psychiatric History: Reviewed. [facility] taken overdose on Ambien. He has history of taking overdose on Ambien 2 other times but does not require psychiatric inpatient treatment. He had history of bad temper mood swing severe anger rage and poor sleep. He endorse history of manic-like symptoms when he gets very impulsive and aggressive. He has history of getting speeding tickets. He denies any psychosis or any hallucination. He was never tried any psychotropic medication other than Xanax and Ambien by his  primary care physician.  Past Medical History:  Past Medical History:  Diagnosis Date  . Allergy   . Anxiety   . Arthritis   . COPD (chronic obstructive pulmonary disease) (HCC)   . Depression   . Emphysema of lung (HCC)   . Hyperlipidemia   . Morbid obesity (HCC)   . OSA (obstructive sleep apnea)    uses CPAP nightly  . PONV (postoperative nausea and vomiting)   . Unspecified essential hypertension     Past Surgical History:  Procedure Laterality Date  . INSERTION OF MESH N/A 09/18/2015   Procedure: INSERTION OF MESH;  Surgeon: Abigail Miyamoto, MD;  Location: [facility];  Service: General;  Laterality: N/A;  . LEG SURGERY Left   . SHOULDER ARTHROSCOPY Left   . TONSILLECTOMY    . UMBILICAL HERNIA REPAIR N/A 09/18/2015   Procedure: HERNIA REPAIR UMBILICAL ADULT WITH MESH ;  Surgeon: Abigail Miyamoto, MD;  Location: [facility];  Service: General;  Laterality: N/A;    Family Psychiatric History: Reviewed.  Family History:  Family History  Problem Relation Age of Onset  . Ovarian cancer Mother   . Breast cancer Mother   . Macular degeneration Mother   . Alcohol abuse Mother   . Arthritis Mother   . Emphysema Paternal Grandfather   . Cancer - Other Father        gallbladder cancer  . Alcohol abuse Father   .  Cancer Maternal Grandmother   . Heart disease Maternal Grandfather   . Emphysema Paternal Grandmother   . Hyperlipidemia Brother   . Hypertension Brother   . Lung cancer Paternal Uncle     Social History:  Social History   Social History  . Marital status: Married    Spouse name: N/A  . Number of children: N/A  . 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 Smoking status: Former Smoker    Packs/day: 2.00    Years: 26.00    Types: Cigarettes    Quit date: 08/13/2003  . Smokeless tobacco: Never Used     Comment: 2ppd x 26 years  . Alcohol use No     Comment: social  .  Drug use: No  . Sexual activity: Yes    Partners: Male    Birth control/ protection: None   Other Topics Concern  . Not on file   Social History Narrative   Married. Education: college. Exercise: No.    Allergies:  Allergies  Allergen Reactions  . Penicillins Anaphylaxis    REACTION: anaphylaxis  . Valsartan     REACTION: itch/swelling    Metabolic Disorder Labs: Lab Results  Component Value Date   HGBA1C 6.6 (H) 06/10/2016   MPG 143 06/10/2016   MPG 140 03/11/2016   No results found for: PROLACTIN Lab Results  Component Value Date   CHOL 134 12/18/2016   TRIG 106 12/18/2016   HDL 42 12/18/2016   CHOLHDL 3.2 12/18/2016   VLDL 22 06/10/2016   LDLCALC 71 12/18/2016   LDLCALC 67 06/10/2016     Current Medications: Current Outpatient Prescriptions  Medication Sig Dispense Refill  . aspirin 81 MG tablet Take 1 tablet (81 mg total) by mouth daily. 30 tablet 0  . b complex vitamins tablet Take 1 tablet by mouth daily.    . calcium gluconate 500 MG tablet Take 500 mg by mouth daily.    . Cholecalciferol (VITAMIN D) 2000 UNITS tablet Take 1 tablet (2,000 Units total) by mouth daily. 30 tablet 0  . gabapentin (NEURONTIN) 300 MG capsule TAKE 3 BY MOUTH THREE TIMES DAILY 810 capsule 2  . hydrOXYzine (VISTARIL) 50 MG capsule TAKE 2 BY MOUTH AT BEDTIME 180 capsule 0  . losartan-hydrochlorothiazide (HYZAAR) 100-25 MG tablet Take 1 tablet by mouth daily. 90 tablet 2  . Lutein 40 MG CAPS Take 1 capsule by mouth daily.    . magnesium gluconate (MAGONATE) 500 MG tablet Take 500 mg by mouth daily.    . metFORMIN (GLUCOPHAGE) 500 MG tablet Take 1 tablet (500 mg total) by mouth daily with breakfast. 90 tablet 2  . Multiple Vitamin (MULTIVITAMIN) capsule Take 1 capsule by mouth daily. 30 capsule 0  . nitroGLYCERIN (NITROSTAT) 0.4 MG SL tablet Place 1 tablet (0.4 mg total) under the tongue every 5 (five) minutes as needed for chest pain. If chest pain not resolved, after 3 doses 5  minutes apart--call 911 30 tablet 0  . Potassium 99 MG TABS Take 1 tablet by mouth daily.    . sertraline (ZOLOFT) 100 MG tablet TAKE 1 BY MOUTH DAILY 90 tablet 0  . Shark Cartilage 740 MG CAPS Take 1 capsule by mouth 3 (three) times daily.    . simvastatin (ZOCOR) 20 MG tablet TAKE 1 BY MOUTH AT BEDTIME 90 tablet 2  . traMADol (ULTRAM) 50 MG tablet Take 50 mg by mouth every 6 (  six) hours as needed for pain.    . traZODone (DESYREL) 100 MG tablet TAKE 1 BY MOUTH AT BEDTIME 90 tablet 0   No current facility-administered medications for this visit.     Neurologic: Headache: No Seizure: No Paresthesias: No  Musculoskeletal: Strength & Muscle Tone: within normal limits Gait & Station: normal Patient leans: N/A  Psychiatric Specialty Exam: ROS  Blood pressure 140/78, pulse 96, height 6' 2.5" (1.892 m), weight (!) 360 lb (163.3 kg).Body mass index is 45.6 kg/m.  General Appearance: Casual  Eye Contact:  Good  Speech:  Clear and Coherent and increase tone  Volume:  Increased  Mood:  Anxious  Affect:  Congruent  Thought Process:  Goal Directed  Orientation:  Full (Time, Place, and Person)  Thought Content: Logical and Rumination   Suicidal Thoughts:  No  Homicidal Thoughts:  No  Memory:  Immediate;   Good Recent;   Good Remote;   Good  Judgement:  Good  Insight:  Good  Psychomotor Activity:  Normal  Concentration:  Concentration: Good and Attention Span: Good  Recall:  Good  Fund of Knowledge: Good  Language: Good  Akathisia:  No  Handed:  Right  AIMS (if indicated):  0  Assets:  Communication Skills Desire for Improvement Housing Resilience Social Support  ADL's:  Intact  Cognition: WNL  Sleep:  fair    Assessment: Major depressive disorder, recurrent.  Anxiety disorder NOS.  Plan: Discuss psychosocial stressors that causing anxiety for the patient.  Recommended to take Vistaril 150 mg every night to help sleep and anxiety symptoms.  Patient does not want to  change his Zoloft and trazodone dose.  I will continue Zoloft 100 mg and Trazodone 100 mg at bedtime. He does not have time for counseling.  If patient do not improve we will consider adding low-dose Abilify or Lamictal.  Recommended to call us back if he has any question, concern or if he feel worsening of the symptom.  Discuss safety concerns that anytime having active suicidal thoughts or homicidal thoughts and he need to call 911 or go to the local emergency room.  Follow-up in 2 months.  Chrisangel Eskenazi T., MD 02/24/2017, 3:27 PM

## 2017-03-06 ENCOUNTER — Other Ambulatory Visit (HOSPITAL_COMMUNITY): Payer: Self-pay | Admitting: Psychiatry

## 2017-03-06 DIAGNOSIS — F330 Major depressive disorder, recurrent, mild: Secondary | ICD-10-CM

## 2017-03-08 ENCOUNTER — Other Ambulatory Visit (HOSPITAL_COMMUNITY): Payer: Self-pay | Admitting: Psychiatry

## 2017-03-08 ENCOUNTER — Telehealth (HOSPITAL_COMMUNITY): Payer: Self-pay

## 2017-03-08 NOTE — Telephone Encounter (Signed)
Patient called and said that you were going to increase the Zoloft to 150 mg, and it was not sent in. Could you please verify if this and let me know? Thank you!

## 2017-03-08 NOTE — Telephone Encounter (Signed)
He does not want to increase Zoloft but we discussed adding low-dose Abilify and Lamictal if he agreed weekend at either Abilify 2 mg or Lamictal 50 mg daily.

## 2017-03-09 NOTE — Telephone Encounter (Signed)
I called patient and left a voicemail letting him know what Dr. Lolly Mustache said and gave my number for him to call back and let me know.

## 2017-04-28 ENCOUNTER — Ambulatory Visit (HOSPITAL_COMMUNITY): Payer: Self-pay | Admitting: Psychiatry

## 2017-06-10 ENCOUNTER — Other Ambulatory Visit (HOSPITAL_COMMUNITY): Payer: Self-pay | Admitting: Psychiatry

## 2017-06-10 DIAGNOSIS — F330 Major depressive disorder, recurrent, mild: Secondary | ICD-10-CM

## 2017-06-13 ENCOUNTER — Ambulatory Visit (HOSPITAL_COMMUNITY): Payer: Self-pay | Admitting: Psychiatry

## 2017-06-21 ENCOUNTER — Other Ambulatory Visit (HOSPITAL_COMMUNITY): Payer: Self-pay | Admitting: Psychiatry

## 2017-06-21 DIAGNOSIS — F330 Major depressive disorder, recurrent, mild: Secondary | ICD-10-CM

## 2017-06-21 MED ORDER — TRAZODONE HCL 100 MG PO TABS: ORAL_TABLET | ORAL | 0 refills | Status: DC

## 2017-06-21 MED ORDER — HYDROXYZINE PAMOATE 50 MG PO CAPS: ORAL_CAPSULE | ORAL | 0 refills | Status: DC

## 2017-06-21 MED ORDER — SERTRALINE HCL 100 MG PO TABS: ORAL_TABLET | ORAL | 0 refills | Status: DC

## 2017-06-30 ENCOUNTER — Encounter (HOSPITAL_COMMUNITY): Payer: Self-pay | Admitting: Psychiatry

## 2017-06-30 ENCOUNTER — Ambulatory Visit (INDEPENDENT_AMBULATORY_CARE_PROVIDER_SITE_OTHER): Payer: BLUE CROSS/BLUE SHIELD | Admitting: Psychiatry

## 2017-06-30 VITALS — BP 140/82 | HR 98 | Ht 73.0 in | Wt 356.6 lb

## 2017-06-30 DIAGNOSIS — Z79899 Other long term (current) drug therapy: Secondary | ICD-10-CM

## 2017-06-30 DIAGNOSIS — Z87891 Personal history of nicotine dependence: Secondary | ICD-10-CM

## 2017-06-30 DIAGNOSIS — F419 Anxiety disorder, unspecified: Secondary | ICD-10-CM | POA: Diagnosis not present

## 2017-06-30 DIAGNOSIS — Z811 Family history of alcohol abuse and dependence: Secondary | ICD-10-CM

## 2017-06-30 DIAGNOSIS — F330 Major depressive disorder, recurrent, mild: Secondary | ICD-10-CM | POA: Diagnosis not present

## 2017-06-30 NOTE — Progress Notes (Addendum)
BH MD/PA/NP OP Progress Note  06/30/2017 1:58 PM Matthew DuttonGeorge T Hutchinson  MRN:  [id number]  Chief Complaint: I am doing better.  Businesses picking up.  HPI: Matthew Hutchinson came for his follow-up appointment.  He is feeling better.  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  He still have some time emotions and irritability but he feels current medicine working very well.  He does not feel that he need to add any mood stabilizer.  He is sleeping good.  He denies any crying spells or any agitation.  He is concerned about his partner who may have back surgery and need some help.  Overall patient describes his mood is good.  Denies any paranoia, hallucination, anxiety or any panic attack.  His niece is getting married and he is excited to attend that wedding.  His energy level is good.  Patient denies drinking alcohol or using any illegal substances.  He denies any panic attack.  He is taking Vistaril 150 mg every night which is working very well for his sleep.  He has cut down his gabapentin and only taking 300 mg in the morning 600 mg in the afternoon and 600 mg at bedtime.  His gabapentin is given by his primary care physician.  Relationship is going well.  His partner is very supportive.  Patient denies any tremors, shakes or any EPS.  Visit Diagnosis:    ICD-10-CM   1. Major depressive disorder, recurrent episode, mild (HCC) F33.0     Past Psychiatric History: Reviewed. [facility] taken overdose on Ambien. He has history of taking overdose on Ambien 2 other times but does not require psychiatric inpatient treatment. He had history of bad temper mood swing severe anger rage and poor sleep. He endorse history of manic-like symptoms when he gets very impulsive and aggressive. He has history of getting speeding tickets. He denies any psychosis or any hallucination. He was never tried any psychotropic medication other than Xanax and Ambien by his primary care  physician.  Past Medical History:  Past Medical History:  Diagnosis Date  . Allergy   . Anxiety   . Arthritis   . COPD (chronic obstructive pulmonary disease) (HCC)   . Depression   . Emphysema of lung (HCC)   . Hyperlipidemia   . Morbid obesity (HCC)   . OSA (obstructive sleep apnea)    uses CPAP nightly  . PONV (postoperative nausea and vomiting)   . Unspecified essential hypertension     Past Surgical History:  Procedure Laterality Date  . INSERTION OF MESH N/A 09/18/2015   Procedure: INSERTION OF MESH;  Surgeon: [name], MD;  Location: [facility];  Service: General;  Laterality: N/A;  . LEG SURGERY Left   . SHOULDER ARTHROSCOPY Left   . TONSILLECTOMY    . UMBILICAL HERNIA REPAIR N/A 09/18/2015   Procedure: HERNIA REPAIR UMBILICAL ADULT WITH MESH ;  Surgeon: [name], MD;  Location: [facility];  Service: General;  Laterality: N/A;    Family Psychiatric History: Reviewed.  Family History:  Family History  Problem Relation Age of Onset  . Ovarian cancer Mother   . Breast cancer Mother   . Macular degeneration Mother   . Alcohol abuse Mother   . Arthritis Mother   . Emphysema Paternal Grandfather   . Cancer - Other Father        gallbladder cancer  . Alcohol abuse Father   . Cancer Maternal  Grandmother   . Heart disease Maternal Grandfather   . Emphysema Paternal Grandmother   . Hyperlipidemia Brother   . Hypertension Brother   . Lung cancer Paternal Uncle     Social History:  Social History   Socioeconomic History  . Marital status: Married    Spouse name: Not on file  . Number of children: Not on file  . Years of education: Not on file  . Highest education level: Not on file  Social Needs  . Financial resource strain: Not on file  . Food insecurity - worry: Not on file  . Food insecurity - inability: Not on file  . Transportation needs - medical: Not on file  . Transportation needs - non-medical: Not on file   Occupational History  . Occupation: Catering manager firm  Tobacco Use  . Smoking status: Former Smoker    Packs/day: 2.00    Years: 26.00    Pack years: 52.00    Types: Cigarettes    Last attempt to quit: 08/13/2003    Years since quitting: 13.8  . Smokeless tobacco: Never Used  . Tobacco comment: 2ppd x 26 years  Substance and Sexual Activity  . Alcohol use: No    Alcohol/week: 0.0 oz    Comment: social  . Drug use: No  . Sexual activity: Yes    Partners: Male    Birth control/protection: None  Other Topics Concern  . Not on file  Social History Narrative   Married. Education: college. Exercise: No.    Allergies:  Allergies  Allergen Reactions  . Penicillins Anaphylaxis    REACTION: anaphylaxis  . Valsartan     REACTION: itch/swelling    Metabolic Disorder Labs: Lab Results  Component Value Date   HGBA1C 6.6 (H) 06/10/2016   MPG 143 06/10/2016   MPG 140 03/11/2016   No results found for: PROLACTIN Lab Results  Component Value Date   CHOL 134 12/18/2016   TRIG 106 12/18/2016   HDL 42 12/18/2016   CHOLHDL 3.2 12/18/2016   VLDL 22 06/10/2016   LDLCALC 71 12/18/2016   LDLCALC 67 06/10/2016   Lab Results  Component Value Date   TSH 0.697 05/23/2014   TSH 0.819 10/16/2012    Therapeutic Level Labs: No results found for: LITHIUM No results found for: VALPROATE No components found for:  CBMZ  Current Medications: Current Outpatient Medications  Medication Sig Dispense Refill  . aspirin 81 MG tablet Take 1 tablet (81 mg total) by mouth daily. 30 tablet 0  . b complex vitamins tablet Take 1 tablet by mouth daily.    . calcium gluconate 500 MG tablet Take 500 mg by mouth daily.    . Cholecalciferol (VITAMIN D) 2000 UNITS tablet Take 1 tablet (2,000 Units total) by mouth daily. 30 tablet 0  . gabapentin (NEURONTIN) 300 MG capsule TAKE 3 BY MOUTH THREE TIMES DAILY 810 capsule 2  . hydrOXYzine (VISTARIL) 50 MG capsule TAKE 2 BY MOUTH AT BEDTIME 180 capsule 0  .  losartan-hydrochlorothiazide (HYZAAR) 100-25 MG tablet Take 1 tablet by mouth daily. 90 tablet 2  . Lutein 40 MG CAPS Take 1 capsule by mouth daily.    . magnesium gluconate (MAGONATE) 500 MG tablet Take 500 mg by mouth daily.    . metFORMIN (GLUCOPHAGE) 500 MG tablet Take 1 tablet (500 mg total) by mouth daily with breakfast. 90 tablet 2  . Multiple Vitamin (MULTIVITAMIN) capsule Take 1 capsule by mouth daily. 30 capsule 0  . nitroGLYCERIN (NITROSTAT) 0.4  MG SL tablet Place 1 tablet (0.4 mg total) under the tongue every 5 (five) minutes as needed for chest pain. If chest pain not resolved, after 3 doses 5 minutes apart--call 911 30 tablet 0  . Potassium 99 MG TABS Take 1 tablet by mouth daily.    . sertraline (ZOLOFT) 100 MG tablet TAKE 1 BY MOUTH DAILY 90 tablet 0  . Shark Cartilage 740 MG CAPS Take 1 capsule by mouth 3 (three) times daily.    . simvastatin (ZOCOR) 20 MG tablet TAKE 1 BY MOUTH AT BEDTIME 90 tablet 2  . traMADol (ULTRAM) 50 MG tablet Take 50 mg by mouth every 6 (six) hours as needed for pain.    . traZODone (DESYREL) 100 MG tablet TAKE 1 BY MOUTH AT BEDTIME 90 tablet 0   No current facility-administered medications for this visit.      Musculoskeletal: Strength & Muscle Tone: within normal limits Gait & Station: normal Patient leans: N/A  Psychiatric Specialty Exam: ROS  Blood pressure 140/82, pulse 98, height 6\' 1"  (1.854 m), weight (!) 356 lb 9.6 oz (161.8 kg).There is no height or weight on file to calculate BMI.  General Appearance: Casual and overweight  Eye Contact:  Good  Speech:  Clear and Coherent and increase tone  Volume:  Increased  Mood:  Anxious  Affect:  Appropriate  Thought Process:  Goal Directed  Orientation:  Full (Time, Place, and Person)  Thought Content: Logical and Rumination   Suicidal Thoughts:  No  Homicidal Thoughts:  No  Memory:  Immediate;   Good Recent;   Good Remote;   Good  Judgement:  Good  Insight:  Good  Psychomotor  Activity:  Normal  Concentration:  Concentration: Good and Attention Span: Good  Recall:  Good  Fund of Knowledge: Good  Language: Good  Akathisia:  No  Handed:  Right  AIMS (if indicated): not done  Assets:  Communication Skills Desire for Improvement Housing Resilience Transportation  ADL's:  Intact  Cognition: WNL  Sleep:  Good   Screenings: AUDIT     [facility]  Alcohol Use Disorder Identification Test Final Score (AUDIT)  1    PHQ2-9     [facility]  PHQ-2 Total Score  0  3  0  0  0  PHQ-9 Total Score  No data  13  No data  No data  No data       Assessment and Plan: Major depressive disorder, recurrent.  Anxiety disorder NOS.  Patient doing better on his current medication.  Since his business improved he does not feel very anxious.  Continue Zoloft 100 mg daily, trazodone 5 mg at bedtime and Vistaril 150 mg at bedtime.  Discussed medication side effects and benefits.  Recommended to call us back if is any question or any concern.  Patient is not interested in counseling.  Follow-up in 3 months.   Cleotis NipperSyed T Karmela Bram, MD 06/30/2017, 1:58 PM

## 2017-09-07 ENCOUNTER — Other Ambulatory Visit: Payer: Self-pay | Admitting: Family Medicine

## 2017-09-07 DIAGNOSIS — I10 Essential (primary) hypertension: Secondary | ICD-10-CM

## 2017-09-07 DIAGNOSIS — E785 Hyperlipidemia, unspecified: Secondary | ICD-10-CM

## 2017-09-28 ENCOUNTER — Ambulatory Visit (HOSPITAL_COMMUNITY): Payer: Self-pay | Admitting: Psychiatry

## 2017-10-27 ENCOUNTER — Ambulatory Visit (HOSPITAL_COMMUNITY): Payer: Self-pay | Admitting: Psychiatry

## 2017-10-28 ENCOUNTER — Other Ambulatory Visit (HOSPITAL_COMMUNITY): Payer: Self-pay | Admitting: Psychiatry

## 2017-10-28 ENCOUNTER — Other Ambulatory Visit: Payer: Self-pay | Admitting: Family Medicine

## 2017-10-28 DIAGNOSIS — E785 Hyperlipidemia, unspecified: Secondary | ICD-10-CM

## 2017-10-28 DIAGNOSIS — F330 Major depressive disorder, recurrent, mild: Secondary | ICD-10-CM

## 2017-10-28 NOTE — Telephone Encounter (Signed)
simvastatin refill Last OV: 12/18/16 Last Refill:09/08/17 #30 tab no RF Pharmacy:[facility] [place]

## 2017-11-01 ENCOUNTER — Other Ambulatory Visit (HOSPITAL_COMMUNITY): Payer: Self-pay | Admitting: Psychiatry

## 2017-11-01 DIAGNOSIS — F330 Major depressive disorder, recurrent, mild: Secondary | ICD-10-CM

## 2017-11-04 ENCOUNTER — Encounter: Payer: Self-pay | Admitting: Family Medicine

## 2017-11-04 ENCOUNTER — Ambulatory Visit (INDEPENDENT_AMBULATORY_CARE_PROVIDER_SITE_OTHER): Payer: BLUE CROSS/BLUE SHIELD | Admitting: Family Medicine

## 2017-11-04 VITALS — BP 144/78 | HR 85 | Temp 97.8°F | Ht 74.0 in | Wt 341.4 lb

## 2017-11-04 DIAGNOSIS — Z125 Encounter for screening for malignant neoplasm of prostate: Secondary | ICD-10-CM

## 2017-11-04 DIAGNOSIS — Z0000 Encounter for general adult medical examination without abnormal findings: Secondary | ICD-10-CM

## 2017-11-04 DIAGNOSIS — Z1159 Encounter for screening for other viral diseases: Secondary | ICD-10-CM | POA: Diagnosis not present

## 2017-11-04 DIAGNOSIS — E785 Hyperlipidemia, unspecified: Secondary | ICD-10-CM

## 2017-11-04 DIAGNOSIS — I10 Essential (primary) hypertension: Secondary | ICD-10-CM

## 2017-11-04 DIAGNOSIS — Z114 Encounter for screening for human immunodeficiency virus [HIV]: Secondary | ICD-10-CM

## 2017-11-04 DIAGNOSIS — M79605 Pain in left leg: Secondary | ICD-10-CM

## 2017-11-04 DIAGNOSIS — G8929 Other chronic pain: Secondary | ICD-10-CM

## 2017-11-04 DIAGNOSIS — Z23 Encounter for immunization: Secondary | ICD-10-CM | POA: Diagnosis not present

## 2017-11-04 DIAGNOSIS — E119 Type 2 diabetes mellitus without complications: Secondary | ICD-10-CM

## 2017-11-04 MED ORDER — METFORMIN HCL 500 MG PO TABS: 500.0000 mg | ORAL_TABLET | Freq: Every day | ORAL | 2 refills | Status: DC

## 2017-11-04 MED ORDER — SIMVASTATIN 20 MG PO TABS: ORAL_TABLET | ORAL | 2 refills | Status: DC

## 2017-11-04 MED ORDER — LOSARTAN POTASSIUM-HCTZ 100-25 MG PO TABS: 1.0000 | ORAL_TABLET | Freq: Every day | ORAL | 2 refills | Status: DC

## 2017-11-04 MED ORDER — GABAPENTIN 300 MG PO CAPS: 600.0000 mg | ORAL_CAPSULE | Freq: Two times a day (BID) | ORAL | 2 refills | Status: DC

## 2017-11-04 MED ORDER — AMLODIPINE BESYLATE 5 MG PO TABS: 5.0000 mg | ORAL_TABLET | Freq: Every day | ORAL | 2 refills | Status: DC

## 2017-11-04 NOTE — Patient Instructions (Addendum)
Keep follow up with Dr. Nile RiggsShapiro.  If cholesterol is elevated, will need repeat testing when you are on meds for 6 weeks.  Add amlodipine 5mg  per day for blood pressure. Keep a record of your blood pressures outside of the office on occasion and bring them to the next office visit.  Thank you for coming in today.   Keeping you healthy  Get these tests  Blood pressure- Have your blood pressure checked once a year by your healthcare provider.  Normal blood pressure is 120/80  Weight- Have your body mass index (BMI) calculated to screen for obesity.  BMI is a measure of body fat based on height and weight. You can also calculate your own BMI at [email or web address]  Cholesterol- Have your cholesterol checked every year.  Diabetes- Have your blood sugar checked regularly if you have high blood pressure, high cholesterol, have a family history of diabetes or if you are overweight.  Screening for Colon Cancer- Colonoscopy starting at age 57.  Screening may begin sooner depending on your family history and other health conditions. Follow up colonoscopy as directed by your Gastroenterologist.  Screening for Prostate Cancer- Both blood work (PSA) and a rectal exam help screen for Prostate Cancer.  Screening begins at age 57 with African-American men and at age 57 with Caucasian men.  Screening may begin sooner depending on your family history.  Take these medicines  Aspirin- One aspirin daily can help prevent Heart disease and Stroke.  Flu shot- Every fall.  Tetanus- Every 10 years.  Zostavax- Once after the age of 57 to prevent Shingles.  Pneumonia shot- Once after the age of 57; if you are younger than 3065, ask your healthcare provider if you need a Pneumonia shot.  Take these steps  Don't smoke- If you do smoke, talk to your doctor about quitting.  For tips on how to quit, go to www.smokefree.gov or call 1-800-QUIT-NOW.  Be physically active- Exercise 5 days a week for at least 30  minutes.  If you are not already physically active start slow and gradually work up to 30 minutes of moderate physical activity.  Examples of moderate activity include walking briskly, mowing the yard, dancing, swimming, bicycling, etc.  Eat a healthy diet- Eat a variety of healthy food such as fruits, vegetables, low fat milk, low fat cheese, yogurt, lean meant, poultry, fish, beans, tofu, etc. For more information go to www.thenutritionsource.org  Drink alcohol in moderation- Limit alcohol intake to less than two drinks a day. Never drink and drive.  Dentist- Brush and floss twice daily; visit your dentist twice a year.  Depression- Your emotional health is as important as your physical health. If you're feeling down, or losing interest in things you would normally enjoy please talk to your healthcare provider.  Eye exam- Visit your eye doctor every year.  Safe sex- If you may be exposed to a sexually transmitted infection, use a condom.  Seat belts- Seat belts can save your life; always wear one.  Smoke/Carbon Monoxide detectors- These detectors need to be installed on the appropriate level of your home.  Replace batteries at least once a year.  Skin cancer- When out in the sun, cover up and use sunscreen 15 SPF or higher.  Violence- If anyone is threatening you, please tell your healthcare provider.  Living Will/ Health care power of attorney- Speak with your healthcare provider and family.   IF you received an x-ray today, you will receive an invoice from [facility]. Please  contact [facility] at [phone] with questions or concerns regarding your invoice.   IF you received labwork today, you will receive an invoice from [facility]. Please contact [facility] at [phone] with questions or concerns regarding your invoice.   Our billing staff will not be able to assist you with questions regarding bills from these companies.  You will be contacted with the lab  results as soon as they are available. The fastest way to get your results is to activate your My Chart account. Instructions are located on the last page of this paperwork. If you have not heard from [place] regarding the results in 2 weeks, please contact this office.

## 2017-11-04 NOTE — Progress Notes (Signed)
Subjective:  By signing my name below, I, [name], attest that this documentation has been prepared under the direction and in the presence of Shade Flood, MD Electronically Signed: Charline Bills, ED Scribe 11/04/2017 at 9:11 AM.   Patient ID: Matthew Hutchinson, male    DOB: 1961-03-07, 57 y.o.   MRN: [id number]  Chief Complaint  Patient presents with  . Annual Exam    CPE and med Refill    HPI Matthew Hutchinson is a 57 y.o. male who presents to Primary Care at [facility] for an annual exam. H/o HTN, depression, DM, hyperlipidemia.  DM Metformin 500 mg qd. Optho: Dr. [name] 11/2016. Due for pneumovax, last in 2011. Foot exam: today. - Denies new side-effects of meds. Lab Results  Component Value Date   HGBA1C 6.6 (H) 06/10/2016   Diabetic Foot Exam - Simple   Simple Foot Form Diabetic Foot exam was performed with the following findings:  Yes 11/04/2017  9:11 AM  Visual Inspection No deformities, no ulcerations, no other skin breakdown bilaterally:  Yes Sensation Testing Intact to touch and monofilament testing bilaterally:  Yes Pulse Check Posterior Tibialis and Dorsalis pulse intact bilaterally:  Yes Comments Pt states has had numbness and tingling with calf down to toes (since 1984) This is normal for him    Obesity Wt Readings from Last 3 Encounters:  11/04/17 (!) 341 lb 6.4 oz (154.9 kg)  02/02/17 (!) 354 lb 12.8 oz (160.9 kg)  12/18/16 (!) 354 lb 9.6 oz (160.8 kg)  Body mass index is 43.83 kg/m. Had been losing weight with diet changes when discussed last visit. Had taken gabapentin for chronic L leg pain with plan on decreasing dosing with weight lost. - Pt is now taking gabapentin 600 mg bid.  Hyperlipidemia Lab Results  Component Value Date   CHOL 134 12/18/2016   HDL 42 12/18/2016   LDLCALC 71 12/18/2016   TRIG 106 12/18/2016   CHOLHDL 3.2 12/18/2016   Lab Results  Component Value Date   ALT 33 12/18/2016   AST 30 12/18/2016   ALKPHOS  87 12/18/2016   BILITOT 0.6 12/18/2016  Continued on simvastatin 20 mg qd. - Has been off of meds for ~2 wks. Denies new side-effects.  Depression Followed by psychiatry. Zoloft 100 mg qd, hydroxyzine 100 mg qhs and trazodone 100 mg qhs prescribed by Dr. Lolly Mustache. Last saw psychiatry in Dec. - Has been off of meds for ~2 wks.  HTN Lab Results  Component Value Date   CREATININE 1.08 12/18/2016   BP Readings from Last 3 Encounters:  11/04/17 (!) 142/90  02/02/17 132/84  12/18/16 132/77  Hyzaar 100-25 mg qd. - Pt states that his systolic BP has been in the 140s at psychiatry appointments as well. Denies cp, lightheadedness, dizziness, HAs, new side-effects.  Hernia Pt had umbilical hernia repair in 09/2015 by Dr. Magnus Ivan. States he occasionally has twinges, but nothing persistent.  CA Screening Colonoscopy: 11/2010, plan for rpt in 10 yrs. Prostate CA Screening: agrees to DRE and PSA today Lab Results  Component Value Date   PSA 0.47 06/02/2015   PSA 0.35 11/29/2013   PSA 0.44 10/16/2012   Immunizations Immunization History  Administered Date(s) Administered  . Hepatitis B 10/02/2010, 11/02/2010, 03/08/2011  . Influenza Split 04/11/2012  . Influenza Whole 05/12/2010  . Influenza,inj,Quad PF,6+ Mos 04/30/2013, 05/23/2014, 06/02/2015, 06/10/2016  . Pneumococcal Conjugate-13 06/02/2015  . Pneumococcal Polysaccharide-23 02/17/2009, 05/12/2010  . Td 08/27/2004  . Tdap 03/06/2015  Shingles: sent  to pharmacy Prevnar: updated today  Hep C screening: today HIV Screening: today  Depression Screening Depression screen [facility] 2/9 11/04/2017 12/18/2016 06/10/2016 03/11/2016 12/03/2015  Decreased Interest 0 0 1 0 0  Down, Depressed, Hopeless 0 0 2 0 0  PHQ - 2 Score 0 0 3 0 0  Altered sleeping - - 3 - -  Tired, decreased energy - - 2 - -  Change in appetite - - 0 - -  Feeling bad or failure about yourself  - - 0 - -  Trouble concentrating - - 3 - -  Moving slowly or fidgety/restless - -  2 - -  Suicidal thoughts - - 0 - -  PHQ-9 Score - - 13 - -  Difficult doing work/chores - - Somewhat difficult - -     Visual Acuity Screening   Right eye Left eye Both eyes  Without correction:     With correction: 20/50 20/70 20/40-1   Vision: Followed by Dr. [name], last visit in 11/2016. Plans to do cataract surgery in both eyes Dentist: Does not have natural teeth so does not see dentist Exercise: some swimming and some walking, plans on buying a recumbent bike  Patient Active Problem List   Diagnosis Date Noted  . Morbid obesity (HCC) 02/02/2017  . Chronic pain due to trauma 06/25/2013  . Depressive disorder, not elsewhere classified 11/09/2012  . CAD (coronary artery disease) 02/21/2012  . Lipid disorder 02/21/2012  . Angina pectoris 09/27/2011  . Obstructive sleep apnea 05/20/2010  . Essential hypertension 04/17/2010  . EMPHYSEMA 04/17/2010  . WEIGHT GAIN, ABNORMAL 04/17/2010   Past Medical History:  Diagnosis Date  . Allergy   . Anxiety   . Arthritis   . COPD (chronic obstructive pulmonary disease) (HCC)   . Depression   . Emphysema of lung (HCC)   . Hyperlipidemia   . Morbid obesity (HCC)   . OSA (obstructive sleep apnea)    uses CPAP nightly  . PONV (postoperative nausea and vomiting)   . Unspecified essential hypertension    Past Surgical History:  Procedure Laterality Date  . INSERTION OF MESH N/A 09/18/2015   Procedure: INSERTION OF MESH;  Surgeon: Abigail Miyamoto, MD;  Location: [facility];  Service: General;  Laterality: N/A;  . LEG SURGERY Left   . SHOULDER ARTHROSCOPY Left   . TONSILLECTOMY    . UMBILICAL HERNIA REPAIR N/A 09/18/2015   Procedure: HERNIA REPAIR UMBILICAL ADULT WITH MESH ;  Surgeon: Abigail Miyamoto, MD;  Location: [facility];  Service: General;  Laterality: N/A;   Allergies  Allergen Reactions  . Penicillins Anaphylaxis    REACTION: anaphylaxis  . Valsartan     REACTION: itch/swelling   Prior to  Admission medications   Medication Sig Start Date End Date Taking? Authorizing Provider  aspirin 81 MG tablet Take 1 tablet (81 mg total) by mouth daily. 11/13/12   [name], NP  b complex vitamins tablet Take 1 tablet by mouth daily.    [provider]  calcium gluconate 500 MG tablet Take 500 mg by mouth daily.    [provider]  Cholecalciferol (VITAMIN D) 2000 UNITS tablet Take 1 tablet (2,000 Units total) by mouth daily. 11/13/12   [name], NP  gabapentin (NEURONTIN) 300 MG capsule TAKE 3 BY MOUTH THREE TIMES DAILY 12/18/16   Shade Flood, MD  hydrOXYzine (VISTARIL) 50 MG capsule TAKE 2 BY MOUTH AT BEDTIME 06/21/17   Arfeen, Phillips Grout,  MD  losartan-hydrochlorothiazide (HYZAAR) 100-25 MG tablet TAKE 1 TABLET BY MOUTH DAILY 09/09/17   Shade FloodGreene, Sahaj Bona R, MD  Lutein 40 MG CAPS Take 1 capsule by mouth daily.    [provider]  magnesium gluconate (MAGONATE) 500 MG tablet Take 500 mg by mouth daily.    [provider]  metFORMIN (GLUCOPHAGE) 500 MG tablet Take 1 tablet (500 mg total) by mouth daily with breakfast. 12/18/16   Shade FloodGreene, Benny Deutschman R, MD  Multiple Vitamin (MULTIVITAMIN) capsule Take 1 capsule by mouth daily. 11/13/12   Charm RingsLord, Jamison Y, NP  nitroGLYCERIN (NITROSTAT) 0.4 MG SL tablet Place 1 tablet (0.4 mg total) under the tongue every 5 (five) minutes as needed for chest pain. If chest pain not resolved, after 3 doses 5 minutes apart--call 911 11/13/12   Charm RingsLord, Jamison Y, NP  Potassium 99 MG TABS Take 1 tablet by mouth daily.    [provider]  sertraline (ZOLOFT) 100 MG tablet TAKE 1 TABLET BY MOUTH DAILY 11/01/17   Arfeen, Phillips GroutSyed T, MD  Shark Cartilage 740 MG CAPS Take 1 capsule by mouth 3 (three) times daily.    [provider]  simvastatin (ZOCOR) 20 MG tablet TAKE 1 TABLET BY MOUTH AT BEDTIME, OFFICE VISIT NEEDED FOR REFILLS 10/28/17   Shade FloodGreene, Kingston Shawgo R, MD  traMADol (ULTRAM) 50 MG tablet Take 50 mg by mouth every 6 (six) hours as  needed for pain.    [provider]  traZODone (DESYREL) 100 MG tablet TAKE 1 BY MOUTH AT BEDTIME 06/21/17   Arfeen, Phillips GroutSyed T, MD   Social History   Socioeconomic History  . Marital status: Married    Spouse name: Not on file  . Number of children: Not on file  . Years of education: Not on file  . Highest education level: Not on file  Occupational History  . Occupation: [employer]  Social Needs  . Financial resource strain: Not on file  . Food insecurity:    Worry: Not on file    Inability: Not on file  . Transportation needs:    Medical: Not on file    Non-medical: Not on file  Tobacco Use  . Smoking status: Former Smoker    Packs/day: 2.00    Years: 26.00    Pack years: 52.00    Types: Cigarettes    Last attempt to quit: 08/13/2003    Years since quitting: 14.2  . Smokeless tobacco: Never Used  . Tobacco comment: 2ppd x 26 years  Substance and Sexual Activity  . Alcohol use: No    Alcohol/week: 0.0 oz    Comment: social  . Drug use: No  . Sexual activity: Yes    Partners: Male    Birth control/protection: None  Lifestyle  . Physical activity:    Days per week: Not on file    Minutes per session: Not on file  . Stress: Not on file  Relationships  . Social connections:    Talks on phone: Not on file    Gets together: Not on file    Attends religious service: Not on file    Active member of club or organization: Not on file    Attends meetings of clubs or organizations: Not on file    Relationship status: Not on file  . Intimate partner violence:    Fear of current or ex partner: Not on file    Emotionally abused: Not on file    Physically abused: Not on file  Forced sexual activity: Not on file  Other Topics Concern  . Not on file  Social History Narrative   Married. Education: college. Exercise: No.   Review of Systems  Cardiovascular: Negative for chest pain.  Musculoskeletal: Positive for myalgias (chronic leg pain).  Neurological:  Negative for dizziness, light-headedness and headaches.      Objective:   Physical Exam  Constitutional: He is oriented to person, place, and time. He appears well-developed and well-nourished.  HENT:  Head: Normocephalic and atraumatic.  Right Ear: External ear normal.  Left Ear: External ear normal.  Mouth/Throat: Oropharynx is clear and moist.  Eyes: Pupils are equal, round, and reactive to light. Conjunctivae and EOM are normal.  Neck: Normal range of motion. Neck supple. No thyromegaly present.  Cardiovascular: Normal rate, regular rhythm, normal heart sounds and intact distal pulses.  Pulmonary/Chest: Effort normal and breath sounds normal. No respiratory distress. He has no wheezes.  Abdominal: Soft. He exhibits no distension. There is no tenderness. Hernia confirmed negative in the right inguinal area and confirmed negative in the left inguinal area.  Genitourinary: Prostate normal.  Musculoskeletal: Normal range of motion. He exhibits no edema or tenderness.  Lymphadenopathy:    He has no cervical adenopathy.  Neurological: He is alert and oriented to person, place, and time. He has normal reflexes.  Skin: Skin is warm and dry.  Psychiatric: He has a normal mood and affect. His behavior is normal.  Vitals reviewed.    Vitals:   11/04/17 0837 11/04/17 0932  BP: (!) 142/90 (!) 144/78  Pulse: 85   Temp: 97.8 F (36.6 C)   TempSrc: Oral   SpO2: 92%   Weight: (!) 341 lb 6.4 oz (154.9 kg)   Height: 6\' 2"  (1.88 m)       Assessment & Plan:   ILEY BREEDEN is a 57 y.o. male Annual physical exam  - -anticipatory guidance as below in AVS, screening labs above. Health maintenance items as above in HPI discussed/recommended as applicable.   Type 2 diabetes mellitus without complication, without long-term current use of insulin (HCC) - Plan: metFORMIN (GLUCOPHAGE) 500 MG tablet, Hemoglobin A1c, Microalbumin, urine  - controlled prior. Check A1c, microalbumin, continue  diet/exercise changes - commended on efforts.   Essential hypertension - Plan: losartan-hydrochlorothiazide (HYZAAR) 100-25 MG tablet, Comprehensive metabolic panel Decreased control - add 5mg  norvasc  Chronic pain of left lower extremity - Plan: gabapentin (NEURONTIN) 300 MG capsule  -decreasing need but still on gabapentin as above, continue 600mg  BID.   Hyperlipidemia, unspecified hyperlipidemia type - Plan: simvastatin (ZOCOR) 20 MG tablet, Comprehensive metabolic panel, Lipid panel  - check labs. With DM, may need stronger statin, but will first repeat testing after continued use of meds for 6 weeks if needed.   Screening for HIV (human immunodeficiency virus) - Plan: HIV antibody  Encounter for hepatitis C screening test for low risk patient - Plan: Hepatitis C antibody  Screening for prostate cancer - Plan: PSA  - We discussed pros and cons of prostate cancer screening, and after this discussion, he chose to have screening done. PSA obtained, and no concerning findings on DRE.   Need for prophylactic vaccination against Streptococcus pneumoniae (pneumococcus) - Plan: Pneumococcal polysaccharide vaccine 23-valent greater than or equal to 2yo subcutaneous/IM given   Meds ordered this encounter  Medications  . metFORMIN (GLUCOPHAGE) 500 MG tablet    Sig: Take 1 tablet (500 mg total) by mouth daily with breakfast.    Dispense:  90  tablet    Refill:  2  . losartan-hydrochlorothiazide (HYZAAR) 100-25 MG tablet    Sig: Take 1 tablet by mouth daily.    Dispense:  90 tablet    Refill:  2  . gabapentin (NEURONTIN) 300 MG capsule    Sig: Take 2 capsules (600 mg total) by mouth 2 (two) times daily.    Dispense:  360 capsule    Refill:  2  . simvastatin (ZOCOR) 20 MG tablet    Sig: TAKE 1 TABLET BY MOUTH AT BEDTIME,    Dispense:  90 tablet    Refill:  2  . amLODipine (NORVASC) 5 MG tablet    Sig: Take 1 tablet (5 mg total) by mouth daily.    Dispense:  90 tablet    Refill:  2    Patient Instructions   Keep follow up with Dr. [name].  If cholesterol is elevated, will need repeat testing when you are on meds for 6 weeks.  Add amlodipine 5mg  per day for blood pressure. Keep a record of your blood pressures outside of the office on occasion and bring them to the next office visit.  Thank you for coming in today.   Keeping you healthy  Get these tests  Blood pressure- Have your blood pressure checked once a year by your healthcare provider.  Normal blood pressure is 120/80  Weight- Have your body mass index (BMI) calculated to screen for obesity.  BMI is a measure of body fat based on height and weight. You can also calculate your own BMI at [email or web address].  Cholesterol- Have your cholesterol checked every year.  Diabetes- Have your blood sugar checked regularly if you have high blood pressure, high cholesterol, have a family history of diabetes or if you are overweight.  Screening for Colon Cancer- Colonoscopy starting at age 65.  Screening may begin sooner depending on your family history and other health conditions. Follow up colonoscopy as directed by your Gastroenterologist.  Screening for Prostate Cancer- Both blood work (PSA) and a rectal exam help screen for Prostate Cancer.  Screening begins at age 41 with African-American men and at age 61 with Caucasian men.  Screening may begin sooner depending on your family history.  Take these medicines  Aspirin- One aspirin daily can help prevent Heart disease and Stroke.  Flu shot- Every fall.  Tetanus- Every 10 years.  Zostavax- Once after the age of 75 to prevent Shingles.  Pneumonia shot- Once after the age of 68; if you are younger than 63, ask your healthcare provider if you need a Pneumonia shot.  Take these steps  Don't smoke- If you do smoke, talk to your doctor about quitting.  For tips on how to quit, go to www.smokefree.gov or call 1-800-QUIT-NOW.  Be physically active- Exercise 5  days a week for at least 30 minutes.  If you are not already physically active start slow and gradually work up to 30 minutes of moderate physical activity.  Examples of moderate activity include walking briskly, mowing the yard, dancing, swimming, bicycling, etc.  Eat a healthy diet- Eat a variety of healthy food such as fruits, vegetables, low fat milk, low fat cheese, yogurt, lean meant, poultry, fish, beans, tofu, etc. For more information go to www.thenutritionsource.org  Drink alcohol in moderation- Limit alcohol intake to less than two drinks a day. Never drink and drive.  Dentist- Brush and floss twice daily; visit your dentist twice a year.  Depression- Your emotional health is as important as your  physical health. If you're feeling down, or losing interest in things you would normally enjoy please talk to your healthcare provider.  Eye exam- Visit your eye doctor every year.  Safe sex- If you may be exposed to a sexually transmitted infection, use a condom.  Seat belts- Seat belts can save your life; always wear one.  Smoke/Carbon Monoxide detectors- These detectors need to be installed on the appropriate level of your home.  Replace batteries at least once a year.  Skin cancer- When out in the sun, cover up and use sunscreen 15 SPF or higher.  Violence- If anyone is threatening you, please tell your healthcare provider.  Living Will/ Health care power of attorney- Speak with your healthcare provider and family.   IF you received an x-ray today, you will receive an invoice from [facility]. Please contact [facility] at [phone] with questions or concerns regarding your invoice.   IF you received labwork today, you will receive an invoice from [facility]. Please contact [facility] at [phone] with questions or concerns regarding your invoice.   Our billing staff will not be able to assist you with questions regarding bills from these companies.  You will  be contacted with the lab results as soon as they are available. The fastest way to get your results is to activate your My Chart account. Instructions are located on the last page of this paperwork. If you have not heard from [place] regarding the results in 2 weeks, please contact this office.       I personally performed the services described in this documentation, which was scribed in my presence. The recorded information has been reviewed and considered for accuracy and completeness, addended by me as needed, and agree with information above.  Signed,   [name], MD Primary Care at [facility].  11/06/17 9:42 AM

## 2017-11-05 LAB — COMPREHENSIVE METABOLIC PANEL
ALK PHOS: 123 IU/L — AB (ref 39–117)
ALT: 35 IU/L (ref 0–44)
AST: 29 IU/L (ref 0–40)
Albumin/Globulin Ratio: 2 (ref 1.2–2.2)
Albumin: 4.3 g/dL (ref 3.5–5.5)
BILIRUBIN TOTAL: 0.7 mg/dL (ref 0.0–1.2)
BUN/Creatinine Ratio: 10 (ref 9–20)
BUN: 9 mg/dL (ref 6–24)
CHLORIDE: 99 mmol/L (ref 96–106)
CO2: 20 mmol/L (ref 20–29)
Calcium: 9.4 mg/dL (ref 8.7–10.2)
Creatinine, Ser: 0.9 mg/dL (ref 0.76–1.27)
GFR calc non Af Amer: 95 mL/min/{1.73_m2} (ref >59)
GFR, EST AFRICAN AMERICAN: 110 mL/min/{1.73_m2} (ref >59)
GLUCOSE: 299 mg/dL — AB (ref 65–99)
Globulin, Total: 2.2 g/dL (ref 1.5–4.5)
Potassium: 4 mmol/L (ref 3.5–5.2)
Sodium: 134 mmol/L (ref 134–144)
TOTAL PROTEIN: 6.5 g/dL (ref 6.0–8.5)

## 2017-11-05 LAB — HEMOGLOBIN A1C
Est. average glucose Bld gHb Est-mCnc: 278 mg/dL
Hgb A1c MFr Bld: 11.3 % — ABNORMAL HIGH (ref 4.8–5.6)

## 2017-11-05 LAB — PSA: Prostate Specific Ag, Serum: 0.3 ng/mL (ref 0.0–4.0)

## 2017-11-05 LAB — HEPATITIS C ANTIBODY: Hep C Virus Ab: <0.1 s/co ratio (ref 0.0–0.9)

## 2017-11-05 LAB — LIPID PANEL
Chol/HDL Ratio: 6.1 ratio — ABNORMAL HIGH (ref 0.0–5.0)
Cholesterol, Total: 212 mg/dL — ABNORMAL HIGH (ref 100–199)
HDL: 35 mg/dL — AB (ref >39)
LDL Calculated: 119 mg/dL — ABNORMAL HIGH (ref 0–99)
TRIGLYCERIDES: 291 mg/dL — AB (ref 0–149)
VLDL CHOLESTEROL CAL: 58 mg/dL — AB (ref 5–40)

## 2017-11-05 LAB — MICROALBUMIN, URINE: Microalbumin, Urine: 35.1 ug/mL

## 2017-11-05 LAB — HIV ANTIBODY (ROUTINE TESTING W REFLEX): HIV Screen 4th Generation wRfx: NONREACTIVE

## 2017-11-28 ENCOUNTER — Encounter

## 2017-11-28 ENCOUNTER — Ambulatory Visit (HOSPITAL_COMMUNITY): Payer: Self-pay | Admitting: Psychiatry

## 2017-11-30 ENCOUNTER — Ambulatory Visit (HOSPITAL_COMMUNITY): Payer: Self-pay | Admitting: Psychiatry

## 2018-01-04 ENCOUNTER — Ambulatory Visit (HOSPITAL_COMMUNITY): Payer: BLUE CROSS/BLUE SHIELD | Admitting: Psychiatry

## 2018-01-04 ENCOUNTER — Encounter (HOSPITAL_COMMUNITY): Payer: Self-pay | Admitting: Psychiatry

## 2018-01-04 VITALS — BP 116/68 | HR 85 | Ht 74.0 in | Wt 321.0 lb

## 2018-01-04 DIAGNOSIS — F330 Major depressive disorder, recurrent, mild: Secondary | ICD-10-CM

## 2018-01-04 DIAGNOSIS — F411 Generalized anxiety disorder: Secondary | ICD-10-CM | POA: Diagnosis not present

## 2018-01-04 MED ORDER — TRAZODONE HCL 100 MG PO TABS: ORAL_TABLET | ORAL | 0 refills | Status: DC

## 2018-01-04 MED ORDER — SERTRALINE HCL 100 MG PO TABS: ORAL_TABLET | ORAL | 0 refills | Status: DC

## 2018-01-04 MED ORDER — HYDROXYZINE PAMOATE 50 MG PO CAPS: ORAL_CAPSULE | ORAL | 0 refills | Status: DC

## 2018-01-04 NOTE — Progress Notes (Signed)
BH MD/PA/NP OP Progress Note  01/04/2018 12:12 PM JAKIM DRAPEAU  MRN:  [id number]  Chief Complaint: I am doing fine but my blood sugar is very high.  I am not sure what I am doing wrong.  HPI: Patient came for his follow-up appointment.  He is taking his medication as prescribed.  Recently he seen his primary care physician and he had blood work which shows hemoglobin A1c 11 and his cholesterol was also high.  He is not sure why his blood sugar is high because he is watching his calories and try not to eat sweet food.  He will again have blood work in October.  Overall he describes his mood is a stable.  He denies any irritability, anger, mania, psychosis or any hallucination.  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  His relationship is also stable.  He denies any hallucination or any suicidal thoughts.  His energy level is fair.  He has no tremors, shakes or any EPS.  Is not drinking or using any illegal substances.  He lost weight from the past.  Visit Diagnosis:    ICD-10-CM   1. GAD (generalized anxiety disorder) F41.1   2. Major depressive disorder, recurrent episode, mild (HCC) F33.0 traZODone (DESYREL) 100 MG tablet    sertraline (ZOLOFT) 100 MG tablet    hydrOXYzine (VISTARIL) 50 MG capsule    Past Psychiatric History: Reviewed [facility] taken overdose on Ambien. He has history of taking overdose on Ambien 2 other times but does not require psychiatric inpatient treatment. He had history of bad temper mood swing severe anger rage and poor sleep. He endorse history of manic-like symptoms when he gets very impulsive and aggressive. He has history of getting speeding tickets. He denies any psychosis or any hallucination. He was never tried any psychotropic medication other than Xanax and Ambien by his primary care physician.  Past Medical History:  Past Medical History:  Diagnosis Date  . Allergy   . Anxiety   . Arthritis   . COPD (chronic  obstructive pulmonary disease) (HCC)   . Depression   . Emphysema of lung (HCC)   . Hyperlipidemia   . Morbid obesity (HCC)   . OSA (obstructive sleep apnea)    uses CPAP nightly  . PONV (postoperative nausea and vomiting)   . Unspecified essential hypertension     Past Surgical History:  Procedure Laterality Date  . INSERTION OF MESH N/A 09/18/2015   Procedure: INSERTION OF MESH;  Surgeon: Abigail Miyamoto, MD;  Location: [facility];  Service: General;  Laterality: N/A;  . LEG SURGERY Left   . SHOULDER ARTHROSCOPY Left   . TONSILLECTOMY    . UMBILICAL HERNIA REPAIR N/A 09/18/2015   Procedure: HERNIA REPAIR UMBILICAL ADULT WITH MESH ;  Surgeon: Abigail Miyamoto, MD;  Location: [facility];  Service: General;  Laterality: N/A;    Family Psychiatric History: Reviewed.  Family History:  Family History  Problem Relation Age of Onset  . Ovarian cancer Mother   . Breast cancer Mother   . Macular degeneration Mother   . Alcohol abuse Mother   . Arthritis Mother   . Emphysema Paternal Grandfather   . Cancer - Other Father        gallbladder cancer  . Alcohol abuse Father   . Cancer Maternal Grandmother   . Heart disease Maternal Grandfather   . Emphysema Paternal Grandmother   . Hyperlipidemia Brother   . Hypertension  Brother   . Lung cancer Paternal Uncle     Social History:  Social History   Socioeconomic History  . Marital status: Married    Spouse name: Not on file  . Number of children: Not on file  . Years of education: Not on file  . Highest education level: Not on file  Occupational History  . Occupation: Air cabin crew  Social Needs  . Financial resource strain: Not on file  . Food insecurity:    Worry: Not on file    Inability: Not on file  . Transportation needs:    Medical: Not on file    Non-medical: Not on file  Tobacco Use  . Smoking status: Former Smoker    Packs/day: 2.00    Years: 26.00    Pack years: 52.00     Types: Cigarettes    Last attempt to quit: 08/13/2003    Years since quitting: 14.4  . Smokeless tobacco: Never Used  . Tobacco comment: 2ppd x 26 years  Substance and Sexual Activity  . Alcohol use: No    Alcohol/week: 0.0 oz    Comment: social  . Drug use: No  . Sexual activity: Yes    Partners: Male    Birth control/protection: None  Lifestyle  . Physical activity:    Days per week: Not on file    Minutes per session: Not on file  . Stress: Not on file  Relationships  . Social connections:    Talks on phone: Not on file    Gets together: Not on file    Attends religious service: Not on file    Active member of club or organization: Not on file    Attends meetings of clubs or organizations: Not on file    Relationship status: Not on file  Other Topics Concern  . Not on file  Social History Narrative   Married. Education: college. Exercise: No.    Allergies:  Allergies  Allergen Reactions  . Penicillins Anaphylaxis    REACTION: anaphylaxis  . Valsartan     REACTION: itch/swelling    Metabolic Disorder Labs: Lab Results  Component Value Date   HGBA1C 11.3 (H) 11/04/2017   MPG 143 06/10/2016   MPG 140 03/11/2016   No results found for: PROLACTIN Lab Results  Component Value Date   CHOL 212 (H) 11/04/2017   TRIG 291 (H) 11/04/2017   HDL 35 (L) 11/04/2017   CHOLHDL 6.1 (H) 11/04/2017   VLDL 22 06/10/2016   LDLCALC 119 (H) 11/04/2017   LDLCALC 71 12/18/2016   Lab Results  Component Value Date   TSH 0.697 05/23/2014   TSH 0.819 10/16/2012    Therapeutic Level Labs: No results found for: LITHIUM No results found for: VALPROATE No components found for:  CBMZ  Current Medications: Current Outpatient Medications  Medication Sig Dispense Refill  . amLODipine (NORVASC) 5 MG tablet Take 1 tablet (5 mg total) by mouth daily. 90 tablet 2  . aspirin 81 MG tablet Take 1 tablet (81 mg total) by mouth daily. 30 tablet 0  . b complex vitamins tablet Take 1 tablet  by mouth daily.    . calcium gluconate 500 MG tablet Take 500 mg by mouth daily.    . Cholecalciferol (VITAMIN D) 2000 UNITS tablet Take 1 tablet (2,000 Units total) by mouth daily. 30 tablet 0  . gabapentin (NEURONTIN) 300 MG capsule Take 2 capsules (600 mg total) by mouth 2 (two) times daily. 360 capsule 2  . hydrOXYzine (  VISTARIL) 50 MG capsule TAKE 2 BY MOUTH AT BEDTIME 180 capsule 0  . losartan-hydrochlorothiazide (HYZAAR) 100-25 MG tablet Take 1 tablet by mouth daily. 90 tablet 2  . Lutein 40 MG CAPS Take 1 capsule by mouth daily.    . magnesium gluconate (MAGONATE) 500 MG tablet Take 500 mg by mouth daily.    . metFORMIN (GLUCOPHAGE) 500 MG tablet Take 1 tablet (500 mg total) by mouth daily with breakfast. 90 tablet 2  . Multiple Vitamin (MULTIVITAMIN) capsule Take 1 capsule by mouth daily. 30 capsule 0  . nitroGLYCERIN (NITROSTAT) 0.4 MG SL tablet Place 1 tablet (0.4 mg total) under the tongue every 5 (five) minutes as needed for chest pain. If chest pain not resolved, after 3 doses 5 minutes apart--call 911 30 tablet 0  . Potassium 99 MG TABS Take 1 tablet by mouth daily.    . sertraline (ZOLOFT) 100 MG tablet TAKE 1 TABLET BY MOUTH DAILY 90 tablet 0  . Shark Cartilage 740 MG CAPS Take 1 capsule by mouth 3 (three) times daily.    . simvastatin (ZOCOR) 20 MG tablet TAKE 1 TABLET BY MOUTH AT BEDTIME, 90 tablet 2  . traMADol (ULTRAM) 50 MG tablet Take 50 mg by mouth every 6 (six) hours as needed for pain.    . traZODone (DESYREL) 100 MG tablet TAKE 1 BY MOUTH AT BEDTIME 90 tablet 0   No current facility-administered medications for this visit.      Musculoskeletal: Strength & Muscle Tone: within normal limits Gait & Station: normal Patient leans: N/A  Psychiatric Specialty Exam: ROS  Blood pressure 116/68, pulse 85, height 6\' 2"  (1.88 m), weight (!) 321 lb (145.6 kg).There is no height or weight on file to calculate BMI.  General Appearance: Casual  Eye Contact:  Good  Speech:   increase tone  Volume:  Normal  Mood:  Anxious  Affect:  Appropriate  Thought Process:  Goal Directed  Orientation:  Full (Time, Place, and Person)  Thought Content: Logical   Suicidal Thoughts:  No  Homicidal Thoughts:  No  Memory:  Immediate;   Good Recent;   Good Remote;   Good  Judgement:  Good  Insight:  Good  Psychomotor Activity:  Normal  Concentration:  Concentration: Good and Attention Span: Good  Recall:  Good  Fund of Knowledge: Good  Language: Good  Akathisia:  No  Handed:  Right  AIMS (if indicated): not done  Assets:  Communication Skills Desire for Improvement Housing Resilience Social Support Talents/Skills  ADL's:  Intact  Cognition: WNL  Sleep:  Good   Screenings: AUDIT     [facility]  Alcohol Use Disorder Identification Test Final Score (AUDIT)  1    PHQ2-9     Office Visit from 11/04/2017 in [facility] Care at [facility] Visit from 12/18/2016 in Primary Care at [facility] Visit from 06/10/2016 in Primary Care at [facility] Visit from 03/11/2016 in Primary Care at [facility] Visit from 12/03/2015 in Primary Care at [facility] Total Score  0  0  3  0  0  PHQ-9 Total Score  -  -  13  -  -       Assessment and Plan: Major depressive disorder, recurrent.  Generalized anxiety disorder.  Patient taking his medication as prescribed.  His mood is a stable but he is very concerned and nervous about his general health.  His hemoglobin A1c and  cholesterol is high.  He does not want to change his current psychiatric medication.  I will continue Vistaril 150 mg daily.  Zoloft 100 mg daily and trazodone 10 mg at bedtime.  Discussed medication side effects and benefits.  He has lost 15 pounds since the last visit.  Recommended to call us back if he has any question or any concern.  Encouraged to continue healthy lifestyle.  Follow-up in 3 months.   Cleotis NipperSyed T Markey Deady, MD 01/04/2018,  12:12 PM

## 2018-04-06 ENCOUNTER — Ambulatory Visit (HOSPITAL_COMMUNITY): Payer: Self-pay | Admitting: Psychiatry

## 2018-04-13 ENCOUNTER — Encounter (HOSPITAL_COMMUNITY): Payer: Self-pay | Admitting: Psychiatry

## 2018-04-13 ENCOUNTER — Other Ambulatory Visit: Payer: Self-pay

## 2018-04-13 ENCOUNTER — Ambulatory Visit (INDEPENDENT_AMBULATORY_CARE_PROVIDER_SITE_OTHER): Payer: BLUE CROSS/BLUE SHIELD | Admitting: Psychiatry

## 2018-04-13 DIAGNOSIS — Z87891 Personal history of nicotine dependence: Secondary | ICD-10-CM | POA: Diagnosis not present

## 2018-04-13 DIAGNOSIS — F330 Major depressive disorder, recurrent, mild: Secondary | ICD-10-CM

## 2018-04-13 DIAGNOSIS — F411 Generalized anxiety disorder: Secondary | ICD-10-CM

## 2018-04-13 MED ORDER — TRAZODONE HCL 100 MG PO TABS: ORAL_TABLET | ORAL | 0 refills | Status: DC

## 2018-04-13 MED ORDER — SERTRALINE HCL 100 MG PO TABS: ORAL_TABLET | ORAL | 0 refills | Status: DC

## 2018-04-13 MED ORDER — HYDROXYZINE PAMOATE 50 MG PO CAPS: ORAL_CAPSULE | ORAL | 0 refills | Status: DC

## 2018-04-13 NOTE — Progress Notes (Signed)
BH MD/PA/NP OP Progress Note  04/13/2018 2:42 PM Matthew Hutchinson  MRN:  [id number]  Chief Complaint: I am feeling better.  I lost weight and my blood sugar is better.  HPI: Matthew Hutchinson came for his follow-up appointment.  He is taking his medication as prescribed.  He has lost more than 20 pounds in past 3 months.  He is happy about it.  He is taking metformin which is prescribed by his physician.  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  He also endorsed relationship with his partner is better.  Overall he describes his mood is good but he has noticed few times get anxious and irritable.  He talked about current political situation and is not happy and get frustrated.  He denies any violence or anger but gets some time irritable.  He is sleeping better.  He uses CPAP machine which helps his sleep.  He sometimes remember talking to himself and deceased people while sleeping.  Patient has sleep apnea.  He has no tremors, shakes or any EPS.  He is not drinking or using any illegal substances.  Denies any paranoia, hallucination or any suicidal thoughts.  Visit Diagnosis:    ICD-10-CM   1. Major depressive disorder, recurrent episode, mild (HCC) F33.0 hydrOXYzine (VISTARIL) 50 MG capsule    sertraline (ZOLOFT) 100 MG tablet    traZODone (DESYREL) 100 MG tablet  2. GAD (generalized anxiety disorder) F41.1 sertraline (ZOLOFT) 100 MG tablet    Past Psychiatric History: Viewed [facility] taken overdose on Ambien. He has history of taking overdose on Ambien 2 other times but does not require psychiatric inpatient treatment. He had history of bad temper mood swing severe anger rage and poor sleep. He endorse history of manic-like symptoms when he gets very impulsive and aggressive. He has history of getting speeding tickets. He denies any psychosis or any hallucination. He was never tried any psychotropic medication other than Xanax and  Ambien by his primary care physician.  Past Medical History:   Past Medical History:  Diagnosis Date  . Allergy   . Anxiety   . Arthritis   . COPD (chronic obstructive pulmonary disease) (HCC)   . Depression   . Emphysema of lung (HCC)   . Hyperlipidemia   . Morbid obesity (HCC)   . OSA (obstructive sleep apnea)    uses CPAP nightly  . PONV (postoperative nausea and vomiting)   . Unspecified essential hypertension     Past Surgical History:  Procedure Laterality Date  . INSERTION OF MESH N/A 09/18/2015   Procedure: INSERTION OF MESH;  Surgeon: Abigail Miyamoto, MD;  Location: [facility];  Service: General;  Laterality: N/A;  . LEG SURGERY Left   . SHOULDER ARTHROSCOPY Left   . TONSILLECTOMY    . UMBILICAL HERNIA REPAIR N/A 09/18/2015   Procedure: HERNIA REPAIR UMBILICAL ADULT WITH MESH ;  Surgeon: Abigail Miyamoto, MD;  Location:  [facility];  Service: General;  Laterality: N/A;    Family Psychiatric History: Viewed  Family History:  Family History  Problem Relation Age of Onset  . Ovarian cancer Mother   . Breast cancer Mother   . Macular degeneration Mother   . Alcohol abuse Mother   . Arthritis Mother   . Emphysema Paternal Grandfather   . Cancer - Other Father        gallbladder cancer  . Alcohol abuse Father   . Cancer Maternal  Grandmother   . Heart disease Maternal Grandfather   . Emphysema Paternal Grandmother   . Hyperlipidemia Brother   . Hypertension Brother   . Lung cancer Paternal Uncle     Social History:  Social History   Socioeconomic History  . Marital status: Married    Spouse name: Not on file  . Number of children: Not on file  . Years of education: Not on file  . Highest education level: Not on file  Occupational History  . Occupation: Air cabin crew  Social Needs  . Financial resource strain: Not on file  . Food insecurity:    Worry: Not on file    Inability: Not on file  . Transportation needs:     Medical: Not on file    Non-medical: Not on file  Tobacco Use  . Smoking status: Former Smoker    Packs/day: 2.00    Years: 26.00    Pack years: 52.00    Types: Cigarettes    Last attempt to quit: 08/13/2003    Years since quitting: 14.6  . Smokeless tobacco: Never Used  . Tobacco comment: 2ppd x 26 years  Substance and Sexual Activity  . Alcohol use: No    Alcohol/week: 0.0 standard drinks    Comment: social  . Drug use: No  . Sexual activity: Yes    Partners: Male    Birth control/protection: None  Lifestyle  . Physical activity:    Days per week: Not on file    Minutes per session: Not on file  . Stress: Not on file  Relationships  . Social connections:    Talks on phone: Not on file    Gets together: Not on file    Attends religious service: Not on file    Active member of club or organization: Not on file    Attends meetings of clubs or organizations: Not on file    Relationship status: Not on file  Other Topics Concern  . Not on file  Social History Narrative   Married. Education: college. Exercise: No.    Allergies:  Allergies  Allergen Reactions  . Penicillins Anaphylaxis    REACTION: anaphylaxis  . Valsartan     REACTION: itch/swelling    Metabolic Disorder Labs: Lab Results  Component Value Date   HGBA1C 11.3 (H) 11/04/2017   MPG 143 06/10/2016   MPG 140 03/11/2016   No results found for: PROLACTIN Lab Results  Component Value Date   CHOL 212 (H) 11/04/2017   TRIG 291 (H) 11/04/2017   HDL 35 (L) 11/04/2017   CHOLHDL 6.1 (H) 11/04/2017   VLDL 22 06/10/2016   LDLCALC 119 (H) 11/04/2017   LDLCALC 71 12/18/2016   Lab Results  Component Value Date   TSH 0.697 05/23/2014   TSH 0.819 10/16/2012    Therapeutic Level Labs: No results found for: LITHIUM No results found for: VALPROATE No components found for:  CBMZ  Current Medications: Current Outpatient Medications  Medication Sig Dispense Refill  . amLODipine (NORVASC) 5 MG tablet Take  1 tablet (5 mg total) by mouth daily. 90 tablet 2  . aspirin 81 MG tablet Take 1 tablet (81 mg total) by mouth daily. 30 tablet 0  . b complex vitamins tablet Take 1 tablet by mouth daily.    . calcium gluconate 500 MG tablet Take 500 mg by mouth daily.    . Cholecalciferol (VITAMIN D) 2000 UNITS tablet Take 1 tablet (2,000 Units total) by mouth daily. 30 tablet 0  .  gabapentin (NEURONTIN) 300 MG capsule Take 2 capsules (600 mg total) by mouth 2 (two) times daily. 360 capsule 2  . hydrOXYzine (VISTARIL) 50 MG capsule TAKE 2 BY MOUTH AT BEDTIME 180 capsule 0  . losartan-hydrochlorothiazide (HYZAAR) 100-25 MG tablet Take 1 tablet by mouth daily. 90 tablet 2  . Lutein 40 MG CAPS Take 1 capsule by mouth daily.    . magnesium gluconate (MAGONATE) 500 MG tablet Take 500 mg by mouth daily.    . metFORMIN (GLUCOPHAGE) 500 MG tablet Take 1 tablet (500 mg total) by mouth daily with breakfast. 90 tablet 2  . Multiple Vitamin (MULTIVITAMIN) capsule Take 1 capsule by mouth daily. 30 capsule 0  . nitroGLYCERIN (NITROSTAT) 0.4 MG SL tablet Place 1 tablet (0.4 mg total) under the tongue every 5 (five) minutes as needed for chest pain. If chest pain not resolved, after 3 doses 5 minutes apart--call 911 30 tablet 0  . Potassium 99 MG TABS Take 1 tablet by mouth daily.    . sertraline (ZOLOFT) 100 MG tablet TAKE 1 TABLET BY MOUTH DAILY 90 tablet 0  . Shark Cartilage 740 MG CAPS Take 1 capsule by mouth 3 (three) times daily.    . simvastatin (ZOCOR) 20 MG tablet TAKE 1 TABLET BY MOUTH AT BEDTIME, 90 tablet 2  . traMADol (ULTRAM) 50 MG tablet Take 50 mg by mouth every 6 (six) hours as needed for pain.    . traZODone (DESYREL) 100 MG tablet TAKE 1 BY MOUTH AT BEDTIME 90 tablet 0   No current facility-administered medications for this visit.      Musculoskeletal: Strength & Muscle Tone: within normal limits Gait & Station: normal Patient leans: N/A  Psychiatric Specialty Exam: ROS  Blood pressure (!) 146/88,  pulse (!) 103, resp. rate 20, weight (!) 307 lb 3.2 oz (139.3 kg), SpO2 95 %.Body mass index is 39.44 kg/m.  General Appearance: Casual  Eye Contact:  Good  Speech:  Clear and Coherent  Volume:  Normal  Mood:  Anxious  Affect:  Appropriate  Thought Process:  Goal Directed  Orientation:  Full (Time, Place, and Person)  Thought Content: Logical   Suicidal Thoughts:  No  Homicidal Thoughts:  No  Memory:  Immediate;   Good Recent;   Good Remote;   Good  Judgement:  Good  Insight:  Good  Psychomotor Activity:  Normal  Concentration:  Concentration: Good and Attention Span: Good  Recall:  Good  Fund of Knowledge: Good  Language: Good  Akathisia:  No  Handed:  Right  AIMS (if indicated): not done  Assets:  Communication Skills Desire for Improvement Housing Resilience Social Support  ADL's:  Intact  Cognition: WNL  Sleep:  Fair   Screenings: AUDIT     [facility]  Alcohol Use Disorder Identification Test Final Score (AUDIT)  1    PHQ2-9     Office Visit from 11/04/2017 in Primary Care at [facility] Visit from 12/18/2016 in Primary Care at [facility] Visit from 06/10/2016 in Primary Care at [facility] Visit from 03/11/2016 in Primary Care at [facility] Visit from 12/03/2015 in Primary Care at [facility] Total Score  0  0  3  0  0  PHQ-9 Total Score  -  -  13  -  -       Assessment and Plan: Major depressive disorder, recurrent.  Generalized anxiety disorder.  Patient is a stable on  his current medication.  Encourage healthy lifestyle and watch his calorie intake and continue to take medication prescribed by his primary care physician for weight loss and low blood sugar.  Patient does not want to change his current psychotropic medication.  He is not interested in counseling.  Continue Vistaril 150 mg daily, Zoloft 100 mg daily and trazodone 100 mg at bedtime.  Recommended to call us back if  is any question or any concern.  Follow-up in 3 months.   Cleotis Nipper, MD 04/13/2018, 2:42 PM

## 2018-05-09 ENCOUNTER — Other Ambulatory Visit: Payer: Self-pay

## 2018-05-09 ENCOUNTER — Encounter: Payer: Self-pay | Admitting: Family Medicine

## 2018-05-09 ENCOUNTER — Ambulatory Visit: Payer: BLUE CROSS/BLUE SHIELD | Admitting: Family Medicine

## 2018-05-09 VITALS — BP 137/76 | HR 86 | Temp 98.1°F | Ht 74.0 in | Wt 306.0 lb

## 2018-05-09 DIAGNOSIS — I10 Essential (primary) hypertension: Secondary | ICD-10-CM

## 2018-05-09 DIAGNOSIS — E785 Hyperlipidemia, unspecified: Secondary | ICD-10-CM | POA: Diagnosis not present

## 2018-05-09 DIAGNOSIS — E119 Type 2 diabetes mellitus without complications: Secondary | ICD-10-CM

## 2018-05-09 DIAGNOSIS — Z23 Encounter for immunization: Secondary | ICD-10-CM

## 2018-05-09 DIAGNOSIS — E1165 Type 2 diabetes mellitus with hyperglycemia: Secondary | ICD-10-CM

## 2018-05-09 DIAGNOSIS — Z9181 History of falling: Secondary | ICD-10-CM

## 2018-05-09 LAB — POCT GLYCOSYLATED HEMOGLOBIN (HGB A1C): Hemoglobin A1C: 14 % — AB (ref 4.0–5.6)

## 2018-05-09 LAB — GLUCOSE, POCT (MANUAL RESULT ENTRY): POC GLUCOSE: 347 mg/dL — AB (ref 70–99)

## 2018-05-09 MED ORDER — BLOOD GLUCOSE METER KIT: PACK | 0 refills | Status: DC

## 2018-05-09 MED ORDER — METFORMIN HCL 500 MG PO TABS: 500.0000 mg | ORAL_TABLET | Freq: Two times a day (BID) | ORAL | 1 refills | Status: DC

## 2018-05-09 MED ORDER — LOSARTAN POTASSIUM-HCTZ 100-25 MG PO TABS: 1.0000 | ORAL_TABLET | Freq: Every day | ORAL | 2 refills | Status: DC

## 2018-05-09 MED ORDER — PEN NEEDLES 32G X 4 MM MISC: 1.0000 "application " | Freq: Every day | 3 refills | Status: DC

## 2018-05-09 MED ORDER — SIMVASTATIN 20 MG PO TABS: ORAL_TABLET | ORAL | 2 refills | Status: DC

## 2018-05-09 MED ORDER — INSULIN GLARGINE 100 UNIT/ML SOLOSTAR PEN: 10.0000 [IU] | PEN_INJECTOR | Freq: Every day | SUBCUTANEOUS | 3 refills | Status: DC

## 2018-05-09 MED ORDER — AMLODIPINE BESYLATE 5 MG PO TABS: 5.0000 mg | ORAL_TABLET | Freq: Every day | ORAL | 2 refills | Status: DC

## 2018-05-09 NOTE — Progress Notes (Signed)
Subjective:  By signing my name below, I, Moises Blood, attest that this documentation has been prepared under the direction and in the presence of Merri Ray, MD. Electronically Signed: Moises Blood, [place]. 05/09/2018 , 8:58 AM .  Patient was seen in Room 14 .   Patient ID: Matthew Hutchinson, male    DOB: Aug 26, 1960, 57 y.o.   MRN: [id number] Chief Complaint  Patient presents with  . Diabetes    6 m f/u   . Hypertension  . positive fall    6 or more due to old left leg/ankle injury   HPI Matthew Hutchinson is a 57 y.o. male  Here for follow up. Patient was last seen in April.   Diabetes Lab Results  Component Value Date   HGBA1C 11.3 (H) 11/04/2017   He had been on metformin 500 mg qd in April. We had discussed, there was need to change medications based on significant change in control; changed to metformin 500 mg bid. He was advised to follow up in 1 week; patient hasn't been seen since last visit in April.   Patient denies having nausea, vomiting or diarrhea since last visit. He has noticed some increased urination about 3-4 times a day. He denies increased thirst. He's been taking metformin 500 mg qd.   He notes having informed early advanced cataracts and planned having surgery with his eye doctor, Dr. Gershon Crane; last see in 9-10 months and due for appointment in 6-7 weeks. He is also due for an appointment with Dr. Benson Norway for colonoscopy in Dec.   Optho: 11/17/16 Pneumovax: 11/04/17 Foot: 11/04/17 Urine micro albumin: 35 on 11/04/17  He's on a statin; off on previous visit; plan for 6 week recheck while on medication. He hasn't been taking his statin recently.   HTN He's currently on Losartan-HCTZ 100-25 mg qd and amlodipine 5 mg qd. He's been losing weight, but denies any changes in diet.   BP Readings from Last 3 Encounters:  05/09/18 137/76  11/04/17 (!) 144/78  02/02/17 132/84   Lab Results  Component Value Date   CREATININE 0.90 11/04/2017    Falls screening Patient states he's been having about 10-12 falls in the past year. He uses a cane and a walker most of the time. He reports due to left leg giving way.   Patient Active Problem List   Diagnosis Date Noted  . Morbid obesity ([place]) 02/02/2017  . Chronic pain due to trauma 06/25/2013  . Depressive disorder, not elsewhere classified 11/09/2012  . CAD (coronary artery disease) 02/21/2012  . Lipid disorder 02/21/2012  . Angina pectoris 09/27/2011  . Obstructive sleep apnea 05/20/2010  . Essential hypertension 04/17/2010  . EMPHYSEMA 04/17/2010  . WEIGHT GAIN, ABNORMAL 04/17/2010   Past Medical History:  Diagnosis Date  . Allergy   . Anxiety   . Arthritis   . COPD (chronic obstructive pulmonary disease) ([place])   . Depression   . Emphysema of lung ([place])   . Hyperlipidemia   . Morbid obesity ([place])   . OSA (obstructive sleep apnea)    uses CPAP nightly  . PONV (postoperative nausea and vomiting)   . Unspecified essential hypertension    Past Surgical History:  Procedure Laterality Date  . INSERTION OF MESH N/A 09/18/2015   Procedure: INSERTION OF MESH;  Surgeon: Coralie Keens, MD;  Location: [facility];  Service: General;  Laterality: N/A;  . LEG SURGERY Left   . SHOULDER ARTHROSCOPY Left   . TONSILLECTOMY    .  UMBILICAL HERNIA REPAIR N/A 09/18/2015   Procedure: HERNIA REPAIR UMBILICAL ADULT WITH MESH ;  Surgeon: Coralie Keens, MD;  Location: [facility];  Service: General;  Laterality: N/A;   Allergies  Allergen Reactions  . Penicillins Anaphylaxis    REACTION: anaphylaxis  . Valsartan     REACTION: itch/swelling   Prior to Admission medications   Medication Sig Start Date End Date Taking? Authorizing Provider  amLODipine (NORVASC) 5 MG tablet Take 1 tablet (5 mg total) by mouth daily. 11/04/17   Wendie Agreste, MD  aspirin 81 MG tablet Take 1 tablet (81 mg total) by mouth daily. 11/13/12   Patrecia Pour, NP  b complex  vitamins tablet Take 1 tablet by mouth daily.    [provider]  calcium gluconate 500 MG tablet Take 500 mg by mouth daily.    [provider]  Cholecalciferol (VITAMIN D) 2000 UNITS tablet Take 1 tablet (2,000 Units total) by mouth daily. 11/13/12   Patrecia Pour, NP  gabapentin (NEURONTIN) 300 MG capsule Take 2 capsules (600 mg total) by mouth 2 (two) times daily. 11/04/17   Wendie Agreste, MD  hydrOXYzine (VISTARIL) 50 MG capsule TAKE 2 BY MOUTH AT BEDTIME 04/13/18   Arfeen, Arlyce Harman, MD  losartan-hydrochlorothiazide (HYZAAR) 100-25 MG tablet Take 1 tablet by mouth daily. 11/04/17   Wendie Agreste, MD  Lutein 40 MG CAPS Take 1 capsule by mouth daily.    [provider]  magnesium gluconate (MAGONATE) 500 MG tablet Take 500 mg by mouth daily.    [provider]  metFORMIN (GLUCOPHAGE) 500 MG tablet Take 1 tablet (500 mg total) by mouth daily with breakfast. 11/04/17   Wendie Agreste, MD  Multiple Vitamin (MULTIVITAMIN) capsule Take 1 capsule by mouth daily. 11/13/12   Patrecia Pour, NP  nitroGLYCERIN (NITROSTAT) 0.4 MG SL tablet Place 1 tablet (0.4 mg total) under the tongue every 5 (five) minutes as needed for chest pain. If chest pain not resolved, after 3 doses 5 minutes apart--call 911 11/13/12   Patrecia Pour, NP  Potassium 99 MG TABS Take 1 tablet by mouth daily.    [provider]  sertraline (ZOLOFT) 100 MG tablet TAKE 1 TABLET BY MOUTH DAILY 04/13/18   Arfeen, Arlyce Harman, MD  Shark Cartilage 740 MG CAPS Take 1 capsule by mouth 3 (three) times daily.    [provider]  simvastatin (ZOCOR) 20 MG tablet TAKE 1 TABLET BY MOUTH AT BEDTIME, 11/04/17   Wendie Agreste, MD  traMADol (ULTRAM) 50 MG tablet Take 50 mg by mouth every 6 (six) hours as needed for pain.    [provider]  traZODone (DESYREL) 100 MG tablet TAKE 1 BY MOUTH AT BEDTIME 04/13/18   Arfeen, Arlyce Harman, MD   Social History   Socioeconomic History  . Marital  status: Married    Spouse name: Not on file  . Number of children: Not on file  . Years of education: Not on file  . Highest education level: Not on file  Occupational History  . Occupation: Psychologist, prison and probation services  Social Needs  . Financial resource strain: Not on file  . Food insecurity:    Worry: Not on file    Inability: Not on file  . Transportation needs:    Medical: Not on file    Non-medical: Not on file  Tobacco Use  . Smoking status: Former Smoker    Packs/day: 2.00    Years:  26.00    Pack years: 52.00    Types: Cigarettes    Last attempt to quit: 08/13/2003    Years since quitting: 14.7  . Smokeless tobacco: Never Used  . Tobacco comment: 2ppd x 26 years  Substance and Sexual Activity  . Alcohol use: No    Alcohol/week: 0.0 standard drinks    Comment: social  . Drug use: No  . Sexual activity: Yes    Partners: Male    Birth control/protection: None  Lifestyle  . Physical activity:    Days per week: Not on file    Minutes per session: Not on file  . Stress: Not on file  Relationships  . Social connections:    Talks on phone: Not on file    Gets together: Not on file    Attends religious service: Not on file    Active member of club or organization: Not on file    Attends meetings of clubs or organizations: Not on file    Relationship status: Not on file  . Intimate partner violence:    Fear of current or ex partner: Not on file    Emotionally abused: Not on file    Physically abused: Not on file    Forced sexual activity: Not on file  Other Topics Concern  . Not on file  Social History Narrative   Married. Education: college. Exercise: No.    Review of Systems  Constitutional: Negative for fatigue and unexpected weight change.  Eyes: Negative for visual disturbance.  Respiratory: Negative for cough, chest tightness and shortness of breath.   Cardiovascular: Negative for chest pain, palpitations and leg swelling.  Gastrointestinal: Negative for abdominal  pain and blood in stool.  Endocrine: Negative for polydipsia.  Genitourinary: Positive for frequency (increased to 3-4 daily).  Neurological: Negative for dizziness, light-headedness and headaches.       Objective:   Physical Exam  Constitutional: He is oriented to person, place, and time. He appears well-developed and well-nourished.  HENT:  Head: Normocephalic and atraumatic.  Eyes: Pupils are equal, round, and reactive to light. EOM are normal.  Neck: No JVD present. Carotid bruit is not present.  Cardiovascular: Normal rate, regular rhythm and normal heart sounds.  No murmur heard. Pulmonary/Chest: Effort normal and breath sounds normal. He has no rales.  Musculoskeletal: He exhibits no edema.  Neurological: He is alert and oriented to person, place, and time.  Skin: Skin is warm and dry.  Psychiatric: He has a normal mood and affect.  Vitals reviewed.   Vitals:   05/09/18 0837  BP: 137/76  Pulse: 86  Temp: 98.1 F (36.7 C)  TempSrc: Oral  SpO2: 94%  Weight: (!) 306 lb (138.8 kg)  Height: _0  (1.88 m)   Results for orders placed or performed in visit on 05/09/18  POCT glucose (manual entry)  Result Value Ref Range   POC Glucose 347 (A) 70 - 99 mg/dl  POCT glycosylated hemoglobin (Hb A1C)  Result Value Ref Range   Hemoglobin A1C 14.0 (A) 4.0 - 5.6 %   HbA1c POC (<> result, manual entry)     HbA1c, POC (prediabetic range)     HbA1c, POC (controlled diabetic range)         Assessment & Plan:    Matthew Hutchinson is a 57 y.o. male Type 2 diabetes mellitus with hyperglycemia, without long-term current use of insulin ([place]) - Plan: POCT glucose (manual entry), POCT glycosylated hemoglobin (Hb A1C), blood glucose meter kit  and supplies, Insulin Pen Needle (PEN NEEDLES) 32G X 4 MM MISC, DISCONTINUED: Insulin Glargine (LANTUS SOLOSTAR) 100 UNIT/ML Solostar Pen Type 2 diabetes mellitus without complication, without long-term current use of insulin ([place]) - Plan:  metFORMIN (GLUCOPHAGE) 500 MG table  -Unfortunately worse control, but at current level do not believe oral medications will be sufficient.    -Okay to continue metformin 500 mg twice daily, but add Lantus 10 units per night initially with increased dose by 2 units every 3 days until blood sugars below 200.  Potential side effects, hypoglycemic precautions discussed  -Recheck in 2 weeks to discuss further changes at that time.  ER/RTC precautions if new symptoms.  Need for influenza vaccination - Plan: Flu Vaccine QUAD 36+ mos IM  Hyperlipidemia, unspecified hyperlipidemia type - Plan: Comprehensive metabolic panel, Lipid panel, simvastatin (ZOCOR) 20 MG tablet  -Tolerating current statin, check labs, no change in dose for now  Essential hypertension - Plan: Comprehensive metabolic panel, losartan-hydrochlorothiazide (HYZAAR) 100-25 MG tablet  -Stable, no med changes at present, check labs, plan to discuss losartan alternatives at next visit.  History of fall  -Reportedly due to leg symptoms.  Uses cane, recommended continue cane or walker use for stability and handout given on fall precautions at home.  Meds ordered this encounter  Medications  . amLODipine (NORVASC) 5 MG tablet    Sig: Take 1 tablet (5 mg total) by mouth daily.    Dispense:  90 tablet    Refill:  2  . losartan-hydrochlorothiazide (HYZAAR) 100-25 MG tablet    Sig: Take 1 tablet by mouth daily.    Dispense:  90 tablet    Refill:  2  . simvastatin (ZOCOR) 20 MG tablet    Sig: TAKE 1 TABLET BY MOUTH AT BEDTIME,    Dispense:  90 tablet    Refill:  2  . DISCONTD: Insulin Glargine (LANTUS SOLOSTAR) 100 UNIT/ML Solostar Pen    Sig: Inject 10 Units into the skin daily.    Dispense:  5 pen    Refill:  3  . blood glucose meter kit and supplies    Sig: Dispense based on patient and insurance preference. Use 2-3 times per day.    Dispense:  1 each    Refill:  0    Order Specific Question:   Number of strips    Answer:    100    Order Specific Question:   Number of lancets    Answer:   100  . metFORMIN (GLUCOPHAGE) 500 MG tablet    Sig: Take 1 tablet (500 mg total) by mouth 2 (two) times daily with a meal.    Dispense:  180 tablet    Refill:  1  . Insulin Pen Needle (PEN NEEDLES) 32G X 4 MM MISC    Sig: 1 application by Does not apply route daily.    Dispense:  100 each    Refill:  3   Patient Instructions   Blood sugars are unfortunately very high.  Start Lantus 10 units/day for now, increase metformin to twice per day.  Check blood sugars 2-3 times per day, either fasting or 2 hours after meals.  See information below on ideal ranges.  For now plan to increase Lantus by 2 units every 3 days until your blood sugars are under 200.  Once you obtain readings below 200, stay at that dose.  Recheck in 2 weeks.  Use a cane or walker at all times if possible to lessen risk  of fall. See other prevention techniques below at home.   We can discuss other medication options for losartan next visit.  Follow-up with me in 2 weeks   Insulin Injection Instructions, Using Insulin Pens, Adult A subcutaneous injection is a shot of medicine that is injected into the layer of fat between skin and muscle. People with type 1 diabetes must take insulin because their bodies do not make it. People with type 2 diabetes may need to take insulin. There are many different types of insulin. The type of insulin that you take may determine how many injections you give yourself and when you need to take the injections. Choosing a site for injection Insulin absorption varies from site to site. It is best to inject insulin within the same body area, using a different spot in that area for each injection. Do not inject the insulin in the same spot for each injection. There are five main areas that can be used for injecting. These areas include:  Abdomen. This is the preferred area.  Front of thigh.  Upper, outer side of thigh.  Back of  upper arm.  Buttocks.  Using an insulin pen First, follow the steps for Getting Ready, then continue with the steps for Injecting the Insulin. Getting Ready 1. Wash your hands with soap and water. If soap and water are not available, use hand sanitizer. 2. Check the expiration date and type of insulin in the pen. 3. If you are using CLEAR insulin, check to see that it is clear and free of clumps. 4. If you are using CLOUDY insulin, gently roll the pen between your palms several times, or tip the pen up and down several times to mix up the medicine. Do not shake the pen. 5. Remove the cap from the insulin pen. 6. Use an alcohol wipe to clean the rubber stopper of the pen cartridge. 7. Remove the protective paper tab from the disposable needle. Do not let the needle touch anything. 8. Screw the needle onto the pen. 9. Remove the outer and inner plastic covers from the needle. Do not throw away the outer plastic cover yet. 10. Prime the insulin pen by turning the button (dial) to 2 units. Hold the pen with the needle pointing up, and push the button on the opposite end of the pen until a drop of insulin appears at the needle tip. If no insulin appears, repeat this step. 11. Dial the number of units of insulin that you will be injecting. Injecting the Insulin  1. Use an alcohol wipe to clean the site where you will be injecting the needle. Let the site air-dry. 2. Hold the pen in the palm of your writing hand with your thumb on the top. 3. If directed by your health care provider, use your other hand to pinch and hold about an inch of skin at the injection site. Do not directly touch the cleaned part of the skin. 4. Gently but quickly, put the needle straight into the skin. The needle should be at a 90-degree angle (perpendicular) to the skin, as if to form the letter "L." ? For example, if you are giving an injection in the abdomen, the abdomen forms one "leg" of the "L" and the needle forms the  other "leg" of the "L." 5. For adults who have a small amount of body fat, the needle may need to be injected at a 45-degree angle instead. Your health care provider will tell you if this is  necessary. ? A 45-degree angle looks like the letter "V." 6. When the needle is completely inserted into the skin, use the thumb of your writing hand to push the top button of the pen down all the way to inject the insulin. 7. Let go of the skin that you are pinching. Continue to hold the pen in place with your writing hand. 8. Wait five seconds, then pull the needle straight out of the skin. 9. Carefully put the larger (outer) plastic cover of the needle back over the needle, then unscrew the capped needle and discard it in a sharps container, such as an empty plastic bottle with a cover. 10. Put the plastic cap back on the insulin pen. Throwing away supplies  Discard all used needles in a puncture-proof sharps disposal container. You can ask your local pharmacy about where you can get this kind of disposal container, or you can use an empty liquid laundry detergent bottle that has a cover.  Follow the disposal regulations for the area where you live. Do not use any needle more than one time.  Throw away empty disposable pens in the regular trash. What questions should I ask my health care provider?  How often should I be taking insulin?  How often should I check my blood glucose?  What amount of insulin should I be taking at each time?  What are the side effects?  What should I do if my blood glucose is too high?  What should I do if my blood glucose is too low?  What should I do if I forget to take my insulin?  What number should I call if I have questions? Where can I get more information?  American Diabetes Association (ADA): www.diabetes.org  American Association of Diabetes Educators (AADE) Patient Resources: https://www.diabeteseducator.org/patient-resources This information is not  intended to replace advice given to you by your health care provider. Make sure you discuss any questions you have with your health care provider. Document Released: 08/01/2015 Document Revised: 12/04/2015 Document Reviewed: 08/01/2015 Elsevier Interactive Patient Education  2018 [employer].  Type 2 Diabetes Mellitus, Self Care, Adult When you have type 2 diabetes (type 2 diabetes mellitus), you must keep your blood sugar (glucose) under control. You can do this with:  Nutrition.  Exercise.  Lifestyle changes.  Medicines or insulin, if needed.  Support from your doctors and others.  How do I manage my blood sugar?  Check your blood sugar level every day, as often as told.  Call your doctor if your blood sugar is above your goal numbers for 2 tests in a row.  Have your A1c (hemoglobin A1c) level checked at least two times a year. Have it checked more often if your doctor tells you to. Your doctor will set treatment goals for you. Generally, you should have these blood sugar levels:  Before meals (preprandial): 80-130 mg/dL (4.4-7.2 mmol/L).  After meals (postprandial): lower than 180 mg/dL (10 mmol/L).  A1c level: less than 7%.  What do I need to know about high blood sugar? High blood sugar is called hyperglycemia. Know the signs of high blood sugar. Signs may include:  Feeling: ? Thirsty. ? Hungry. ? Very tired.  Needing to pee (urinate) more than usual.  Blurry vision.  What do I need to know about low blood sugar? Low blood sugar is called hypoglycemia. This is when blood sugar is at or below 70 mg/dL (3.9 mmol/L). Symptoms may include:  Feeling: ? Hungry. ? Worried or nervous (  anxious). ? Sweaty and clammy. ? Confused. ? Dizzy. ? Sleepy. ? Sick to your stomach (nauseous).  Having: ? A fast heartbeat (palpitations). ? A headache. ? A change in your vision. ? Jerky movements that you cannot control (seizure). ? Nightmares. ? Tingling or no feeling  (numbness) around the mouth, lips, or tongue.  Having trouble with: ? Talking. ? Paying attention (concentrating). ? Moving (coordination). ? Sleeping.  Shaking.  Passing out (fainting).  Getting upset easily (irritability).  Treating low blood sugar  To treat low blood sugar, eat or drink something sugary right away. If you can think clearly and swallow safely, follow the 15:15 rule:  Take 15 grams of a fast-acting carb (carbohydrate). Some fast-acting carbs are: ? 1 tube of glucose gel. ? 3 sugar tablets (glucose pills). ? 6-8 pieces of hard candy. ? 4 oz (120 mL) of fruit juice. ? 4 oz (120 mL) regular (not diet) soda.  Check your blood sugar 15 minutes after you take the carb.  If your blood sugar is still at or below 70 mg/dL (3.9 mmol/L), take 15 grams of a carb again.  If your blood sugar does not go above 70 mg/dL (3.9 mmol/L) after 3 tries, get help right away.  After your blood sugar goes back to normal, eat a meal or a snack within 1 hour.  Treating very low blood sugar If your blood sugar is at or below 54 mg/dL (3 mmol/L), you have very low blood sugar (severe hypoglycemia). This is an emergency. Do not wait to see if the symptoms will go away. Get medical help right away. Call your local emergency services (911 in the [place] Do not drive yourself to the hospital. If you have very low blood sugar and you cannot eat or drink, you may need a glucagon shot (injection). A family member or friend should learn how to check your blood sugar and how to give you a glucagon shot. Ask your doctor if you need to have a glucagon shot kit at home. What else is important to manage my diabetes? Medicine Follow these instructions about insulin and diabetes medicines:  Take them as told by your doctor.  Adjust them as told by your doctor.  Do not run out of them.  Having diabetes can raise your risk for other long-term conditions. These include heart or kidney disease. Your  doctor may prescribe medicines to help prevent problems from diabetes. Food   Make healthy food choices. These include: ? Chicken, fish, egg whites, and beans. ? Oats, whole wheat, bulgur, brown rice, quinoa, and millet. ? Fresh fruits and vegetables. ? Low-fat dairy products. ? Nuts, avocado, olive oil, and canola oil.  Make a food plan with a specialist (dietitian).  Follow instructions from your doctor about what you cannot eat or drink.  Drink enough fluid to keep your pee (urine) clear or pale yellow.  Eat healthy snacks between healthy meals.  Keep track of carbs that you eat. Read food labels. Learn food serving sizes.  Follow your sick day plan when you cannot eat or drink normally. Make this plan with your doctor so it is ready to use. Activity  Exercise at least 3 times a week.  Do not go more than 2 days without exercising.  Talk with your doctor before you start a new exercise. Your doctor may need to adjust your insulin, medicines, or food. Lifestyle   Do not use any tobacco products. These include cigarettes, chewing tobacco, and e-cigarettes.If you  need help quitting, ask your doctor.  Ask your doctor how much alcohol is safe for you.  Learn to deal with stress. If you need help with this, ask your doctor. Body care  Stay up to date with your shots (immunizations).  Have your eyes and feet checked by a doctor as often as told.  Check your skin and feet every day. Check for cuts, bruises, redness, blisters, or sores.  Brush your teeth and gums two times a day.  Floss at least one time a day.  Go to the dentist least one time every 6 months.  Stay at a healthy weight. General instructions   Take over-the-counter and prescription medicines only as told by your doctor.  Share your diabetes care plan with: ? Your work or school. ? People you live with.  Check your pee (urine) for ketones: ? When you are sick. ? As told by your doctor.  Carry  a card or wear jewelry that says that you have diabetes.  Ask your doctor: ? Do I need to meet with a diabetes educator? ? Where can I find a support group for people with diabetes?  Keep all follow-up visits as told by your doctor. This is important. Where to find more information: To learn more about diabetes, visit:  American Diabetes Association: www.diabetes.org  American Association of Diabetes Educators: www.diabeteseducator.org/patient-resources  This information is not intended to replace advice given to you by your health care provider. Make sure you discuss any questions you have with your health care provider. Document Released: 10/20/2015 Document Revised: 12/04/2015 Document Reviewed: 08/01/2015 Elsevier Interactive Patient Education  2018 [employer] Prevention in the [place] can cause injuries and can affect people from all age groups. There are many simple things that you can do to make your home safe and to help prevent falls. What can I do on the outside of my home?  Regularly repair the edges of walkways and driveways and fix any cracks.  Remove high doorway thresholds.  Trim any shrubbery on the main path into your home.  Use bright outdoor lighting.  Clear walkways of debris and clutter, including tools and rocks.  Regularly check that handrails are securely fastened and in good repair. Both sides of any steps should have handrails.  Install guardrails along the edges of any raised decks or porches.  Have leaves, snow, and ice cleared regularly.  Use sand or salt on walkways during winter months.  In the garage, clean up any spills right away, including grease or oil spills. What can I do in the bathroom?  Use night lights.  Install grab bars by the toilet and in the tub and shower. Do not use towel bars as grab bars.  Use non-skid mats or decals on the floor of the tub or shower.  If you need to sit down while you are in the  shower, use a plastic, non-slip stool.  Keep the floor dry. Immediately clean up any water that spills on the floor.  Remove soap buildup in the tub or shower on a regular basis.  Attach bath mats securely with double-sided non-slip rug tape.  Remove throw rugs and other tripping hazards from the floor. What can I do in the bedroom?  Use night lights.  Make sure that a bedside light is easy to reach.  Do not use oversized bedding that drapes onto the floor.  Have a firm chair that has side arms to use for getting  dressed.  Remove throw rugs and other tripping hazards from the floor. What can I do in the kitchen?  Clean up any spills right away.  Avoid walking on wet floors.  Place frequently used items in easy-to-reach places.  If you need to reach for something above you, use a sturdy step stool that has a grab bar.  Keep electrical cables out of the way.  Do not use floor polish or wax that makes floors slippery. If you have to use wax, make sure that it is non-skid floor wax.  Remove throw rugs and other tripping hazards from the floor. What can I do in the stairways?  Do not leave any items on the stairs.  Make sure that there are handrails on both sides of the stairs. Fix handrails that are broken or loose. Make sure that handrails are as long as the stairways.  Check any carpeting to make sure that it is firmly attached to the stairs. Fix any carpet that is loose or worn.  Avoid having throw rugs at the top or bottom of stairways, or secure the rugs with carpet tape to prevent them from moving.  Make sure that you have a light switch at the top of the stairs and the bottom of the stairs. If you do not have them, have them installed. What are some other fall prevention tips?  Wear closed-toe shoes that fit well and support your feet. Wear shoes that have rubber soles or low heels.  When you use a stepladder, make sure that it is completely opened and that the  sides are firmly locked. Have someone hold the ladder while you are using it. Do not climb a closed stepladder.  Add color or contrast paint or tape to grab bars and handrails in your home. Place contrasting color strips on the first and last steps.  Use mobility aids as needed, such as canes, walkers, scooters, and crutches.  Turn on lights if it is dark. Replace any light bulbs that burn out.  Set up furniture so that there are clear paths. Keep the furniture in the same spot.  Fix any uneven floor surfaces.  Choose a carpet design that does not hide the edge of steps of a stairway.  Be aware of any and all pets.  Review your medicines with your healthcare provider. Some medicines can cause dizziness or changes in blood pressure, which increase your risk of falling. Talk with your health care provider about other ways that you can decrease your risk of falls. This may include working with a physical therapist or trainer to improve your strength, balance, and endurance. This information is not intended to replace advice given to you by your health care provider. Make sure you discuss any questions you have with your health care provider. Document Released: 06/18/2002 Document Revised: 11/25/2015 Document Reviewed: 08/02/2014 Elsevier Interactive Patient Education  [employer].    If you have lab work done today you will be contacted with your lab results within the next 2 weeks.  If you have not heard from [place] then please contact us. The fastest way to get your results is to register for My Chart.   IF you received an x-ray today, you will receive an invoice from [facility]. Please contact [facility] at [phone] with questions or concerns regarding your invoice.   IF you received labwork today, you will receive an invoice from [place]. Please contact [facility] at [phone] with questions or concerns regarding your invoice.  Our billing staff will not  be able to assist you with questions regarding bills from these companies.  You will be contacted with the lab results as soon as they are available. The fastest way to get your results is to activate your My Chart account. Instructions are located on the last page of this paperwork. If you have not heard from [place] regarding the results in 2 weeks, please contact this office.       I personally performed the services described in this documentation, which was scribed in my presence. The recorded information has been reviewed and considered for accuracy and completeness, addended by me as needed, and agree with information above.  Signed,   Merri Ray, MD [facility].  05/11/18 9:11 PM

## 2018-05-09 NOTE — Patient Instructions (Addendum)
Blood sugars are unfortunately very high.  Start Lantus 10 units/day for now, increase metformin to twice per day.  Check blood sugars 2-3 times per day, either fasting or 2 hours after meals.  See information below on ideal ranges.  For now plan to increase Lantus by 2 units every 3 days until your blood sugars are under 200.  Once you obtain readings below 200, stay at that dose.  Recheck in 2 weeks.  Use a cane or walker at all times if possible to lessen risk of fall. See other prevention techniques below at home.   We can discuss other medication options for losartan next visit.  Follow-up with me in 2 weeks   Insulin Injection Instructions, Using Insulin Pens, Adult A subcutaneous injection is a shot of medicine that is injected into the layer of fat between skin and muscle. People with type 1 diabetes must take insulin because their bodies do not make it. People with type 2 diabetes may need to take insulin. There are many different types of insulin. The type of insulin that you take may determine how many injections you give yourself and when you need to take the injections. Choosing a site for injection Insulin absorption varies from site to site. It is best to inject insulin within the same body area, using a different spot in that area for each injection. Do not inject the insulin in the same spot for each injection. There are five main areas that can be used for injecting. These areas include:  Abdomen. This is the preferred area.  Front of thigh.  Upper, outer side of thigh.  Back of upper arm.  Buttocks.  Using an insulin pen First, follow the steps for Getting Ready, then continue with the steps for Injecting the Insulin. Getting Ready 1. Wash your hands with soap and water. If soap and water are not available, use hand sanitizer. 2. Check the expiration date and type of insulin in the pen. 3. If you are using CLEAR insulin, check to see that it is clear and free of  clumps. 4. If you are using CLOUDY insulin, gently roll the pen between your palms several times, or tip the pen up and down several times to mix up the medicine. Do not shake the pen. 5. Remove the cap from the insulin pen. 6. Use an alcohol wipe to clean the rubber stopper of the pen cartridge. 7. Remove the protective paper tab from the disposable needle. Do not let the needle touch anything. 8. Screw the needle onto the pen. 9. Remove the outer and inner plastic covers from the needle. Do not throw away the outer plastic cover yet. 10. Prime the insulin pen by turning the button (dial) to 2 units. Hold the pen with the needle pointing up, and push the button on the opposite end of the pen until a drop of insulin appears at the needle tip. If no insulin appears, repeat this step. 11. Dial the number of units of insulin that you will be injecting. Injecting the Insulin  1. Use an alcohol wipe to clean the site where you will be injecting the needle. Let the site air-dry. 2. Hold the pen in the palm of your writing hand with your thumb on the top. 3. If directed by your health care provider, use your other hand to pinch and hold about an inch of skin at the injection site. Do not directly touch the cleaned part of the skin. 4. Gently but  quickly, put the needle straight into the skin. The needle should be at a 90-degree angle (perpendicular) to the skin, as if to form the letter "L." ? For example, if you are giving an injection in the abdomen, the abdomen forms one "leg" of the "L" and the needle forms the other "leg" of the "L." 5. For adults who have a small amount of body fat, the needle may need to be injected at a 45-degree angle instead. Your health care provider will tell you if this is necessary. ? A 45-degree angle looks like the letter "V." 6. When the needle is completely inserted into the skin, use the thumb of your writing hand to push the top button of the pen down all the way to  inject the insulin. 7. Let go of the skin that you are pinching. Continue to hold the pen in place with your writing hand. 8. Wait five seconds, then pull the needle straight out of the skin. 9. Carefully put the larger (outer) plastic cover of the needle back over the needle, then unscrew the capped needle and discard it in a sharps container, such as an empty plastic bottle with a cover. 10. Put the plastic cap back on the insulin pen. Throwing away supplies  Discard all used needles in a puncture-proof sharps disposal container. You can ask your local pharmacy about where you can get this kind of disposal container, or you can use an empty liquid laundry detergent bottle that has a cover.  Follow the disposal regulations for the area where you live. Do not use any needle more than one time.  Throw away empty disposable pens in the regular trash. What questions should I ask my health care provider?  How often should I be taking insulin?  How often should I check my blood glucose?  What amount of insulin should I be taking at each time?  What are the side effects?  What should I do if my blood glucose is too high?  What should I do if my blood glucose is too low?  What should I do if I forget to take my insulin?  What number should I call if I have questions? Where can I get more information?  American Diabetes Association (ADA): www.diabetes.org  American Association of Diabetes Educators (AADE) Patient Resources: https://www.diabeteseducator.org/patient-resources This information is not intended to replace advice given to you by your health care provider. Make sure you discuss any questions you have with your health care provider. Document Released: 08/01/2015 Document Revised: 12/04/2015 Document Reviewed: 08/01/2015 Elsevier Interactive Patient Education  2018 [employer].  Type 2 Diabetes Mellitus, Self Care, Adult When you have type 2 diabetes (type 2 diabetes  mellitus), you must keep your blood sugar (glucose) under control. You can do this with:  Nutrition.  Exercise.  Lifestyle changes.  Medicines or insulin, if needed.  Support from your doctors and others.  How do I manage my blood sugar?  Check your blood sugar level every day, as often as told.  Call your doctor if your blood sugar is above your goal numbers for 2 tests in a row.  Have your A1c (hemoglobin A1c) level checked at least two times a year. Have it checked more often if your doctor tells you to. Your doctor will set treatment goals for you. Generally, you should have these blood sugar levels:  Before meals (preprandial): 80-130 mg/dL (4.4-7.2 mmol/L).  After meals (postprandial): lower than 180 mg/dL (10 mmol/L).  A1c level:  less than 7%.  What do I need to know about high blood sugar? High blood sugar is called hyperglycemia. Know the signs of high blood sugar. Signs may include:  Feeling: ? Thirsty. ? Hungry. ? Very tired.  Needing to pee (urinate) more than usual.  Blurry vision.  What do I need to know about low blood sugar? Low blood sugar is called hypoglycemia. This is when blood sugar is at or below 70 mg/dL (3.9 mmol/L). Symptoms may include:  Feeling: ? Hungry. ? Worried or nervous (anxious). ? Sweaty and clammy. ? Confused. ? Dizzy. ? Sleepy. ? Sick to your stomach (nauseous).  Having: ? A fast heartbeat (palpitations). ? A headache. ? A change in your vision. ? Jerky movements that you cannot control (seizure). ? Nightmares. ? Tingling or no feeling (numbness) around the mouth, lips, or tongue.  Having trouble with: ? Talking. ? Paying attention (concentrating). ? Moving (coordination). ? Sleeping.  Shaking.  Passing out (fainting).  Getting upset easily (irritability).  Treating low blood sugar  To treat low blood sugar, eat or drink something sugary right away. If you can think clearly and swallow safely, follow the  15:15 rule:  Take 15 grams of a fast-acting carb (carbohydrate). Some fast-acting carbs are: ? 1 tube of glucose gel. ? 3 sugar tablets (glucose pills). ? 6-8 pieces of hard candy. ? 4 oz (120 mL) of fruit juice. ? 4 oz (120 mL) regular (not diet) soda.  Check your blood sugar 15 minutes after you take the carb.  If your blood sugar is still at or below 70 mg/dL (3.9 mmol/L), take 15 grams of a carb again.  If your blood sugar does not go above 70 mg/dL (3.9 mmol/L) after 3 tries, get help right away.  After your blood sugar goes back to normal, eat a meal or a snack within 1 hour.  Treating very low blood sugar If your blood sugar is at or below 54 mg/dL (3 mmol/L), you have very low blood sugar (severe hypoglycemia). This is an emergency. Do not wait to see if the symptoms will go away. Get medical help right away. Call your local emergency services (911 in the [place] Do not drive yourself to the hospital. If you have very low blood sugar and you cannot eat or drink, you may need a glucagon shot (injection). A family member or friend should learn how to check your blood sugar and how to give you a glucagon shot. Ask your doctor if you need to have a glucagon shot kit at home. What else is important to manage my diabetes? Medicine Follow these instructions about insulin and diabetes medicines:  Take them as told by your doctor.  Adjust them as told by your doctor.  Do not run out of them.  Having diabetes can raise your risk for other long-term conditions. These include heart or kidney disease. Your doctor may prescribe medicines to help prevent problems from diabetes. Food   Make healthy food choices. These include: ? Chicken, fish, egg whites, and beans. ? Oats, whole wheat, bulgur, brown rice, quinoa, and millet. ? Fresh fruits and vegetables. ? Low-fat dairy products. ? Nuts, avocado, olive oil, and canola oil.  Make a food plan with a specialist (dietitian).  Follow  instructions from your doctor about what you cannot eat or drink.  Drink enough fluid to keep your pee (urine) clear or pale yellow.  Eat healthy snacks between healthy meals.  Keep track of carbs that you  eat. Read food labels. Learn food serving sizes.  Follow your sick day plan when you cannot eat or drink normally. Make this plan with your doctor so it is ready to use. Activity  Exercise at least 3 times a week.  Do not go more than 2 days without exercising.  Talk with your doctor before you start a new exercise. Your doctor may need to adjust your insulin, medicines, or food. Lifestyle   Do not use any tobacco products. These include cigarettes, chewing tobacco, and e-cigarettes.If you need help quitting, ask your doctor.  Ask your doctor how much alcohol is safe for you.  Learn to deal with stress. If you need help with this, ask your doctor. Body care  Stay up to date with your shots (immunizations).  Have your eyes and feet checked by a doctor as often as told.  Check your skin and feet every day. Check for cuts, bruises, redness, blisters, or sores.  Brush your teeth and gums two times a day.  Floss at least one time a day.  Go to the dentist least one time every 6 months.  Stay at a healthy weight. General instructions   Take over-the-counter and prescription medicines only as told by your doctor.  Share your diabetes care plan with: ? Your work or school. ? People you live with.  Check your pee (urine) for ketones: ? When you are sick. ? As told by your doctor.  Carry a card or wear jewelry that says that you have diabetes.  Ask your doctor: ? Do I need to meet with a diabetes educator? ? Where can I find a support group for people with diabetes?  Keep all follow-up visits as told by your doctor. This is important. Where to find more information: To learn more about diabetes, visit:  American Diabetes Association: www.diabetes.org  American  Association of Diabetes Educators: www.diabeteseducator.org/patient-resources  This information is not intended to replace advice given to you by your health care provider. Make sure you discuss any questions you have with your health care provider. Document Released: 10/20/2015 Document Revised: 12/04/2015 Document Reviewed: 08/01/2015 Elsevier Interactive Patient Education  2018 [employer] Prevention in the [place] can cause injuries and can affect people from all age groups. There are many simple things that you can do to make your home safe and to help prevent falls. What can I do on the outside of my home?  Regularly repair the edges of walkways and driveways and fix any cracks.  Remove high doorway thresholds.  Trim any shrubbery on the main path into your home.  Use bright outdoor lighting.  Clear walkways of debris and clutter, including tools and rocks.  Regularly check that handrails are securely fastened and in good repair. Both sides of any steps should have handrails.  Install guardrails along the edges of any raised decks or porches.  Have leaves, snow, and ice cleared regularly.  Use sand or salt on walkways during winter months.  In the garage, clean up any spills right away, including grease or oil spills. What can I do in the bathroom?  Use night lights.  Install grab bars by the toilet and in the tub and shower. Do not use towel bars as grab bars.  Use non-skid mats or decals on the floor of the tub or shower.  If you need to sit down while you are in the shower, use a plastic, non-slip stool.  Keep the floor dry.  Immediately clean up any water that spills on the floor.  Remove soap buildup in the tub or shower on a regular basis.  Attach bath mats securely with double-sided non-slip rug tape.  Remove throw rugs and other tripping hazards from the floor. What can I do in the bedroom?  Use night lights.  Make sure that a bedside  light is easy to reach.  Do not use oversized bedding that drapes onto the floor.  Have a firm chair that has side arms to use for getting dressed.  Remove throw rugs and other tripping hazards from the floor. What can I do in the kitchen?  Clean up any spills right away.  Avoid walking on wet floors.  Place frequently used items in easy-to-reach places.  If you need to reach for something above you, use a sturdy step stool that has a grab bar.  Keep electrical cables out of the way.  Do not use floor polish or wax that makes floors slippery. If you have to use wax, make sure that it is non-skid floor wax.  Remove throw rugs and other tripping hazards from the floor. What can I do in the stairways?  Do not leave any items on the stairs.  Make sure that there are handrails on both sides of the stairs. Fix handrails that are broken or loose. Make sure that handrails are as long as the stairways.  Check any carpeting to make sure that it is firmly attached to the stairs. Fix any carpet that is loose or worn.  Avoid having throw rugs at the top or bottom of stairways, or secure the rugs with carpet tape to prevent them from moving.  Make sure that you have a light switch at the top of the stairs and the bottom of the stairs. If you do not have them, have them installed. What are some other fall prevention tips?  Wear closed-toe shoes that fit well and support your feet. Wear shoes that have rubber soles or low heels.  When you use a stepladder, make sure that it is completely opened and that the sides are firmly locked. Have someone hold the ladder while you are using it. Do not climb a closed stepladder.  Add color or contrast paint or tape to grab bars and handrails in your home. Place contrasting color strips on the first and last steps.  Use mobility aids as needed, such as canes, walkers, scooters, and crutches.  Turn on lights if it is dark. Replace any light bulbs that  burn out.  Set up furniture so that there are clear paths. Keep the furniture in the same spot.  Fix any uneven floor surfaces.  Choose a carpet design that does not hide the edge of steps of a stairway.  Be aware of any and all pets.  Review your medicines with your healthcare provider. Some medicines can cause dizziness or changes in blood pressure, which increase your risk of falling. Talk with your health care provider about other ways that you can decrease your risk of falls. This may include working with a physical therapist or trainer to improve your strength, balance, and endurance. This information is not intended to replace advice given to you by your health care provider. Make sure you discuss any questions you have with your health care provider. Document Released: 06/18/2002 Document Revised: 11/25/2015 Document Reviewed: 08/02/2014 Elsevier Interactive Patient Education  [employer].    If you have lab work done today you will  be contacted with your lab results within the next 2 weeks.  If you have not heard from [place] then please contact us. The fastest way to get your results is to register for My Chart.   IF you received an x-ray today, you will receive an invoice from [facility]. Please contact [facility] at [phone] with questions or concerns regarding your invoice.   IF you received labwork today, you will receive an invoice from [place]. Please contact [facility] at [phone] with questions or concerns regarding your invoice.   Our billing staff will not be able to assist you with questions regarding bills from these companies.  You will be contacted with the lab results as soon as they are available. The fastest way to get your results is to activate your My Chart account. Instructions are located on the last page of this paperwork. If you have not heard from [place] regarding the results in 2 weeks, please contact this office.

## 2018-05-10 ENCOUNTER — Telehealth: Payer: Self-pay | Admitting: Family Medicine

## 2018-05-10 ENCOUNTER — Other Ambulatory Visit: Payer: Self-pay

## 2018-05-10 DIAGNOSIS — E1165 Type 2 diabetes mellitus with hyperglycemia: Secondary | ICD-10-CM

## 2018-05-10 LAB — LIPID PANEL
Chol/HDL Ratio: 4.6 ratio (ref 0.0–5.0)
Cholesterol, Total: 179 mg/dL (ref 100–199)
HDL: 39 mg/dL — AB (ref >39)
LDL Calculated: 99 mg/dL (ref 0–99)
Triglycerides: 206 mg/dL — ABNORMAL HIGH (ref 0–149)
VLDL Cholesterol Cal: 41 mg/dL — ABNORMAL HIGH (ref 5–40)

## 2018-05-10 LAB — COMPREHENSIVE METABOLIC PANEL
ALBUMIN: 4.4 g/dL (ref 3.5–5.5)
ALK PHOS: 111 IU/L (ref 39–117)
ALT: 22 IU/L (ref 0–44)
AST: 19 IU/L (ref 0–40)
Albumin/Globulin Ratio: 1.8 (ref 1.2–2.2)
BUN/Creatinine Ratio: 11 (ref 9–20)
BUN: 9 mg/dL (ref 6–24)
Bilirubin Total: 0.9 mg/dL (ref 0.0–1.2)
CHLORIDE: 99 mmol/L (ref 96–106)
CO2: 20 mmol/L (ref 20–29)
Calcium: 9.3 mg/dL (ref 8.7–10.2)
Creatinine, Ser: 0.85 mg/dL (ref 0.76–1.27)
GFR calc non Af Amer: 97 mL/min/{1.73_m2} (ref >59)
GFR, EST AFRICAN AMERICAN: 112 mL/min/{1.73_m2} (ref >59)
GLUCOSE: 349 mg/dL — AB (ref 65–99)
Globulin, Total: 2.4 g/dL (ref 1.5–4.5)
POTASSIUM: 3.7 mmol/L (ref 3.5–5.2)
Sodium: 136 mmol/L (ref 134–144)
TOTAL PROTEIN: 6.8 g/dL (ref 6.0–8.5)

## 2018-05-10 MED ORDER — INSULIN GLARGINE 100 UNIT/ML SOLOSTAR PEN: 10.0000 [IU] | PEN_INJECTOR | Freq: Every day | SUBCUTANEOUS | 5 refills | Status: DC

## 2018-05-10 NOTE — Telephone Encounter (Signed)
Copied from CRM [id number]. Topic: Quick Communication - See Telephone Encounter >> May 10, 2018  2:42 PM Lorrine Kin, [place] wrote: CRM for notification. See Telephone encounter for: 05/10/18. Denise with [facility] calling and states that the Insulin Glargine (LANTUS SOLOSTAR) 100 UNIT/ML Solostar Pen was written for 90 day supply. Copay discount card will only allow for insurance to process as a 30 day supply. Patient would like this sent to the [id number] - [place], [place] - [address]#:[phone]

## 2018-05-11 ENCOUNTER — Encounter: Payer: Self-pay | Admitting: Family Medicine

## 2018-05-11 ENCOUNTER — Telehealth: Payer: Self-pay | Admitting: Family Medicine

## 2018-05-11 DIAGNOSIS — E1165 Type 2 diabetes mellitus with hyperglycemia: Secondary | ICD-10-CM

## 2018-05-11 NOTE — Telephone Encounter (Signed)
Copied from CRM [id number]. Topic: Quick Communication - See Telephone Encounter >> May 11, 2018  1:08 PM Trula Slade wrote: CRM for notification. See Telephone encounter for: 05/11/18.  Patient's Insulin Glargine (LANTUS SOLOSTAR) 100 UNIT/ML Solostar Pen 90day supply medication was sent to his mail order pharmacy, but it costs to much for a 90day supply through them.  So he will need a new prescription, written for a 30 day supply, [facility] [place], [place]

## 2018-05-16 NOTE — Telephone Encounter (Signed)
Lantus Solostar100 unit/ML solostar pen too expensive. Pt would like something less expensive.

## 2018-05-16 NOTE — Telephone Encounter (Signed)
Patient called back to get information on Insulin Glargine (LANTUS SOLOSTAR) 100 UNIT/ML Solostar Pen.   Rx was sent to [facility] but the medication is too expensive Patient request that an new Rx is sent to the  [id number], [place] - [address] [phone] (Phone) [phone] (Fax)   So that he can get and start the medicine. Patient is asking that the Rx is written for 30 day supply so he can use a savings coupon. Please advise Ph# [phone]

## 2018-05-17 NOTE — Telephone Encounter (Signed)
Did we check for alternatives? basaglar or toujeo may be more cost effective - please verify with pharmacy, and I can send in new Rx.

## 2018-05-18 NOTE — Telephone Encounter (Signed)
Pt calling back: HE spoke to an actual [facility] representative and basaglar will be the most expensive option. He would like Dr. Neva Seat to please disregard the last message and return to the original [place] [facility] at local [facility]. Please advise.  CB# [phone]   [id number], [place] - [address] [phone] (Phone) [phone] (Fax)

## 2018-05-18 NOTE — Telephone Encounter (Signed)
Pt calling back:  Insurance will cover basaglar at $0 copay for pt.

## 2018-05-18 NOTE — Telephone Encounter (Signed)
Left detailed message on voicemail advising pt per Brittany(pharmacist) [facility] that he will need to contact his insurance to see if basaglar or toujeo will be more cost effective for him and to call our office and let dr Neva Seat know what brand insurance will pay for and he will send it in.  Advised to call office with any further questions or concerns. Dgaddy, CMA

## 2018-05-19 NOTE — Telephone Encounter (Signed)
Message sent to Dr. Neva Seat re Lantus

## 2018-05-20 MED ORDER — INSULIN GLARGINE 100 UNIT/ML SOLOSTAR PEN: 10.0000 [IU] | PEN_INJECTOR | Freq: Every day | SUBCUTANEOUS | 5 refills | Status: DC

## 2018-05-20 NOTE — Telephone Encounter (Signed)
Noted.  Prescription sent to local pharmacy

## 2018-05-20 NOTE — Addendum Note (Signed)
Addended by: [name] R on: 05/20/2018 10:06 AM   Modules accepted: Orders

## 2018-05-23 ENCOUNTER — Encounter: Payer: Self-pay | Admitting: Family Medicine

## 2018-05-23 ENCOUNTER — Ambulatory Visit: Payer: Self-pay | Admitting: Family Medicine

## 2018-05-26 NOTE — Telephone Encounter (Signed)
Patient is calling and would like to know if this can be resent as he has called his pharmacy multiple times and they do not have it. Please advise.

## 2018-05-28 ENCOUNTER — Other Ambulatory Visit: Payer: Self-pay

## 2018-05-28 DIAGNOSIS — E1165 Type 2 diabetes mellitus with hyperglycemia: Secondary | ICD-10-CM

## 2018-05-28 MED ORDER — INSULIN GLARGINE 100 UNIT/ML SOLOSTAR PEN: 10.0000 [IU] | PEN_INJECTOR | Freq: Every day | SUBCUTANEOUS | 5 refills | Status: DC

## 2018-05-28 NOTE — Telephone Encounter (Signed)
Resent today while pharmacist was on the phone with me.  Glitch in pharmacy system and rx was not delivered at prior sending

## 2018-06-06 ENCOUNTER — Encounter: Payer: Self-pay | Admitting: Family Medicine

## 2018-06-14 DIAGNOSIS — J438 Other emphysema: Secondary | ICD-10-CM | POA: Diagnosis not present

## 2018-06-14 DIAGNOSIS — Z1211 Encounter for screening for malignant neoplasm of colon: Secondary | ICD-10-CM | POA: Diagnosis not present

## 2018-06-14 DIAGNOSIS — R0683 Snoring: Secondary | ICD-10-CM | POA: Diagnosis not present

## 2018-06-16 ENCOUNTER — Encounter: Payer: Self-pay | Admitting: Family Medicine

## 2018-06-16 ENCOUNTER — Ambulatory Visit: Payer: BLUE CROSS/BLUE SHIELD | Admitting: Family Medicine

## 2018-06-16 ENCOUNTER — Other Ambulatory Visit: Payer: Self-pay

## 2018-06-16 VITALS — BP 115/76 | HR 80 | Temp 98.0°F | Resp 20 | Ht 72.84 in | Wt 313.0 lb

## 2018-06-16 DIAGNOSIS — E1165 Type 2 diabetes mellitus with hyperglycemia: Secondary | ICD-10-CM

## 2018-06-16 LAB — GLUCOSE, POCT (MANUAL RESULT ENTRY): POC Glucose: 202 mg/dl — AB (ref 70–99)

## 2018-06-16 NOTE — Progress Notes (Signed)
Subjective:    Patient ID: Matthew Hutchinson, male    DOB: 12-10-1960, 57 y.o.   MRN: [id number]  HPI Matthew Hutchinson is a 57 y.o. male Presents today for: Chief Complaint  Patient presents with  . Diabetes    f/u   Diabetes: Diabetes, previously on medications.  Did have increased A1c back in April, plan for change in medications at that time with follow-up.  Ultimately was seen in follow-up October 29.  A1c 14 at that time.  Started on basal insulin 10 units/day initially with dosing increase every few days until readings below 200.  He was continued on metformin 500 mg twice daily. He is on angiotensin receptor blocker and statin. Due for ophthalmology exam (delayed cataract)  up-to-date on foot exam and pneumonia vaccination, urine microalbumin 35 in April.  Trouble getting meds initially.  Ultimately filled lantus, but has not yet started - some fear with starting insulin. Had some readings in 140 low to 240 as a high.  Home meter: 7 day avg:230, 14 day 208, 30 day 208.  All readings are fasting (only 1 after meal). Doing ok on metformin BID, no diarrhea, but BM after salads.  Has cut back on making slushies with sugar and fruits  - has cut back.  Eating primarily salads- 5 days per week, with one meal. No breakfast.  Last nutritionist eval over 1 year ago - she moved away.  No new n/v/abd pain, rare nocturia. No increased thirst.   Colonoscopy in January.   Wt Readings from Last 3 Encounters:  06/16/18 (!) 313 lb (142 kg)  05/09/18 (!) 306 lb (138.8 kg)  11/04/17 (!) 341 lb 6.4 oz (154.9 kg)      Lab Results  Component Value Date   HGBA1C 14.0 (A) 05/09/2018   HGBA1C 11.3 (H) 11/04/2017   HGBA1C 6.6 (H) 06/10/2016   Lab Results  Component Value Date   MICROALBUR 0.6 06/10/2016   LDLCALC 99 05/09/2018   CREATININE 0.85 05/09/2018     Patient Active Problem List   Diagnosis Date Noted  . Morbid obesity ([place]) 02/02/2017  . Chronic pain due to trauma  06/25/2013  . Depressive disorder, not elsewhere classified 11/09/2012  . CAD (coronary artery disease) 02/21/2012  . Lipid disorder 02/21/2012  . Angina pectoris 09/27/2011  . Obstructive sleep apnea 05/20/2010  . Essential hypertension 04/17/2010  . EMPHYSEMA 04/17/2010  . WEIGHT GAIN, ABNORMAL 04/17/2010   Past Medical History:  Diagnosis Date  . Allergy   . Anxiety   . Arthritis   . COPD (chronic obstructive pulmonary disease) ([place])   . Depression   . Emphysema of lung ([place])   . Hyperlipidemia   . Morbid obesity ([place])   . OSA (obstructive sleep apnea)    uses CPAP nightly  . PONV (postoperative nausea and vomiting)   . Unspecified essential hypertension    Past Surgical History:  Procedure Laterality Date  . INSERTION OF MESH N/A 09/18/2015   Procedure: INSERTION OF MESH;  Surgeon: Coralie Keens, MD;  Location: [facility];  Service: General;  Laterality: N/A;  . LEG SURGERY Left   . SHOULDER ARTHROSCOPY Left   . TONSILLECTOMY    . UMBILICAL HERNIA REPAIR N/A 09/18/2015   Procedure: HERNIA REPAIR UMBILICAL ADULT WITH MESH ;  Surgeon: Coralie Keens, MD;  Location: [facility];  Service: General;  Laterality: N/A;   Allergies  Allergen Reactions  . Penicillins Anaphylaxis    REACTION: anaphylaxis  .  Valsartan     REACTION: itch/swelling   Prior to Admission medications   Medication Sig Start Date End Date Taking? Authorizing Provider  amLODipine (NORVASC) 5 MG tablet Take 1 tablet (5 mg total) by mouth daily. 05/09/18  Yes Wendie Agreste, MD  aspirin 81 MG tablet Take 1 tablet (81 mg total) by mouth daily. 11/13/12  Yes Patrecia Pour, NP  b complex vitamins tablet Take 1 tablet by mouth daily.   Yes [provider]  blood glucose meter kit and supplies Dispense based on patient and insurance preference. Use 2-3 times per day. 05/09/18  Yes Wendie Agreste, MD  calcium gluconate 500 MG tablet Take 500 mg by mouth daily.    Yes [provider]  Cholecalciferol (VITAMIN D) 2000 UNITS tablet Take 1 tablet (2,000 Units total) by mouth daily. 11/13/12  Yes Patrecia Pour, NP  gabapentin (NEURONTIN) 300 MG capsule Take 2 capsules (600 mg total) by mouth 2 (two) times daily. 11/04/17  Yes Wendie Agreste, MD  hydrOXYzine (VISTARIL) 50 MG capsule TAKE 2 BY MOUTH AT BEDTIME 04/13/18  Yes Arfeen, Arlyce Harman, MD  Insulin Glargine (LANTUS SOLOSTAR) 100 UNIT/ML Solostar Pen Inject 10 Units into the skin daily. Pharm req 30 day supply instead of 90. 05/28/18  Yes Wendie Agreste, MD  Insulin Pen Needle (PEN NEEDLES) 32G X 4 MM MISC 1 application by Does not apply route daily. 05/09/18  Yes Wendie Agreste, MD  losartan-hydrochlorothiazide (HYZAAR) 100-25 MG tablet Take 1 tablet by mouth daily. 05/09/18  Yes Wendie Agreste, MD  Lutein 40 MG CAPS Take 1 capsule by mouth daily.   Yes [provider]  magnesium gluconate (MAGONATE) 500 MG tablet Take 500 mg by mouth daily.   Yes [provider]  metFORMIN (GLUCOPHAGE) 500 MG tablet Take 1 tablet (500 mg total) by mouth 2 (two) times daily with a meal. 05/09/18  Yes Wendie Agreste, MD  Multiple Vitamin (MULTIVITAMIN) capsule Take 1 capsule by mouth daily. 11/13/12  Yes Lord, Asa Saunas, NP  nitroGLYCERIN (NITROSTAT) 0.4 MG SL tablet Place 1 tablet (0.4 mg total) under the tongue every 5 (five) minutes as needed for chest pain. If chest pain not resolved, after 3 doses 5 minutes apart--call 911 11/13/12  Yes Patrecia Pour, NP  Potassium 99 MG TABS Take 1 tablet by mouth daily.   Yes [provider]  sertraline (ZOLOFT) 100 MG tablet TAKE 1 TABLET BY MOUTH DAILY 04/13/18  Yes Arfeen, Arlyce Harman, MD  Shark Cartilage 740 MG CAPS Take 1 capsule by mouth 3 (three) times daily.   Yes [provider]  simvastatin (ZOCOR) 20 MG tablet TAKE 1 TABLET BY MOUTH AT BEDTIME, 05/09/18  Yes Wendie Agreste, MD  traMADol (ULTRAM) 50 MG tablet Take 50 mg by mouth  every 6 (six) hours as needed for pain.   Yes [provider]  traZODone (DESYREL) 100 MG tablet TAKE 1 BY MOUTH AT BEDTIME 04/13/18  Yes Arfeen, Arlyce Harman, MD   Social History   Socioeconomic History  . Marital status: Married    Spouse name: Not on file  . Number of children: 0  . Years of education: Not on file  . Highest education level: Not on file  Occupational History  . Occupation: Psychologist, prison and probation services  Social Needs  . Financial resource strain: Not on file  . Food insecurity:    Worry: Not on file    Inability: Not on file  .  Transportation needs:    Medical: Not on file    Non-medical: Not on file  Tobacco Use  . Smoking status: Former Smoker    Packs/day: 2.00    Years: 26.00    Pack years: 52.00    Types: Cigarettes    Last attempt to quit: 08/13/2003    Years since quitting: 14.8  . Smokeless tobacco: Never Used  . Tobacco comment: 2ppd x 26 years  Substance and Sexual Activity  . Alcohol use: No    Alcohol/week: 0.0 standard drinks    Comment: social  . Drug use: No  . Sexual activity: Yes    Partners: Male    Birth control/protection: None  Lifestyle  . Physical activity:    Days per week: Not on file    Minutes per session: Not on file  . Stress: Not on file  Relationships  . Social connections:    Talks on phone: Not on file    Gets together: Not on file    Attends religious service: Not on file    Active member of club or organization: Not on file    Attends meetings of clubs or organizations: Not on file    Relationship status: Not on file  . Intimate partner violence:    Fear of current or ex partner: Not on file    Emotionally abused: Not on file    Physically abused: Not on file    Forced sexual activity: Not on file  Other Topics Concern  . Not on file  Social History Narrative   Married. Education: college. Exercise: No.    Review of Systems Per HPI.     Objective:   Physical Exam  Constitutional: He is oriented to person,  place, and time. He appears well-developed and well-nourished.  HENT:  Head: Normocephalic and atraumatic.  Eyes: Pupils are equal, round, and reactive to light. EOM are normal.  Neck: No JVD present. Carotid bruit is not present.  Cardiovascular: Normal rate, regular rhythm and normal heart sounds.  No murmur heard. Pulmonary/Chest: Effort normal and breath sounds normal. He has no rales.  Musculoskeletal: He exhibits no edema.  Neurological: He is alert and oriented to person, place, and time.  Skin: Skin is warm and dry.  Psychiatric: He has a normal mood and affect.  Vitals reviewed.  Vitals:   06/16/18 1458  BP: 115/76  Pulse: 80  Resp: 20  Temp: 98 F (36.7 C)  TempSrc: Oral  SpO2: 95%  Weight: (!) 313 lb (142 kg)  Height: 6' 0.84" (1.85 m)     Results for orders placed or performed in visit on 06/16/18  POCT glucose (manual entry)  Result Value Ref Range   POC Glucose 202 (A) 70 - 99 mg/dl        Assessment & Plan:   LEMAN MARTINEK is a 57 y.o. male Type 2 diabetes mellitus with hyperglycemia, without long-term current use of insulin ([place]) - Plan: POCT glucose (manual entry), Amb ref to Medical Nutrition Therapy-MNT  -Discussed previous A1c, current glucose reading, reasoning for basal insulin in addition to previous regimen.  Also discussed long-acting nature of basal insulin and less likely to cause hypoglycemia.  Commended on diet changes and avoidance of slushies which may have been contributing to his hyperglycemia.  -Continue metformin, start Lantus 10 units/day with hypoglycemic precautions discussed.  -Importance of regular meals was discussed, but will also meet with nutritionist.  -Plan on update through my chart in the next few  weeks with repeat office visit in 6 weeks  No orders of the defined types were placed in this encounter.  Patient Instructions   Continue metformin twice per day, start Lantus 10 units/day.  Watch for low blood sugar  symptoms as as listed below.  Meeting with nutritionist may help as well.  See other information on diabetes and self-care along with goal blood sugar readings.  Give me an update by MyChart in the next few weeks, follow-up office visit 6 weeks. Return to the clinic or go to the nearest emergency room if any of your symptoms worsen or new symptoms occur.   Type 2 Diabetes Mellitus, Self Care, Adult When you have type 2 diabetes (type 2 diabetes mellitus), you must keep your blood sugar (glucose) under control. You can do this with:  Nutrition.  Exercise.  Lifestyle changes.  Medicines or insulin, if needed.  Support from your doctors and others.  How do I manage my blood sugar?  Check your blood sugar level every day, as often as told.  Call your doctor if your blood sugar is above your goal numbers for 2 tests in a row.  Have your A1c (hemoglobin A1c) level checked at least two times a year. Have it checked more often if your doctor tells you to. Your doctor will set treatment goals for you. Generally, you should have these blood sugar levels:  Before meals (preprandial): 80-130 mg/dL (4.4-7.2 mmol/L).  After meals (postprandial): lower than 180 mg/dL (10 mmol/L).  A1c level: less than 7%.  What do I need to know about high blood sugar? High blood sugar is called hyperglycemia. Know the signs of high blood sugar. Signs may include:  Feeling: ? Thirsty. ? Hungry. ? Very tired.  Needing to pee (urinate) more than usual.  Blurry vision.  What do I need to know about low blood sugar? Low blood sugar is called hypoglycemia. This is when blood sugar is at or below 70 mg/dL (3.9 mmol/L). Symptoms may include:  Feeling: ? Hungry. ? Worried or nervous (anxious). ? Sweaty and clammy. ? Confused. ? Dizzy. ? Sleepy. ? Sick to your stomach (nauseous).  Having: ? A fast heartbeat (palpitations). ? A headache. ? A change in your vision. ? Jerky movements that you cannot  control (seizure). ? Nightmares. ? Tingling or no feeling (numbness) around the mouth, lips, or tongue.  Having trouble with: ? Talking. ? Paying attention (concentrating). ? Moving (coordination). ? Sleeping.  Shaking.  Passing out (fainting).  Getting upset easily (irritability).  Treating low blood sugar  To treat low blood sugar, eat or drink something sugary right away. If you can think clearly and swallow safely, follow the 15:15 rule:  Take 15 grams of a fast-acting carb (carbohydrate). Some fast-acting carbs are: ? 1 tube of glucose gel. ? 3 sugar tablets (glucose pills). ? 6-8 pieces of hard candy. ? 4 oz (120 mL) of fruit juice. ? 4 oz (120 mL) regular (not diet) soda.  Check your blood sugar 15 minutes after you take the carb.  If your blood sugar is still at or below 70 mg/dL (3.9 mmol/L), take 15 grams of a carb again.  If your blood sugar does not go above 70 mg/dL (3.9 mmol/L) after 3 tries, get help right away.  After your blood sugar goes back to normal, eat a meal or a snack within 1 hour.  Treating very low blood sugar If your blood sugar is at or below 54 mg/dL (3 mmol/L),  you have very low blood sugar (severe hypoglycemia). This is an emergency. Do not wait to see if the symptoms will go away. Get medical help right away. Call your local emergency services (911 in the [place] Do not drive yourself to the hospital. If you have very low blood sugar and you cannot eat or drink, you may need a glucagon shot (injection). A family member or friend should learn how to check your blood sugar and how to give you a glucagon shot. Ask your doctor if you need to have a glucagon shot kit at home. What else is important to manage my diabetes? Medicine Follow these instructions about insulin and diabetes medicines:  Take them as told by your doctor.  Adjust them as told by your doctor.  Do not run out of them.  Having diabetes can raise your risk for other  long-term conditions. These include heart or kidney disease. Your doctor may prescribe medicines to help prevent problems from diabetes. Food   Make healthy food choices. These include: ? Chicken, fish, egg whites, and beans. ? Oats, whole wheat, bulgur, brown rice, quinoa, and millet. ? Fresh fruits and vegetables. ? Low-fat dairy products. ? Nuts, avocado, olive oil, and canola oil.  Make a food plan with a specialist (dietitian).  Follow instructions from your doctor about what you cannot eat or drink.  Drink enough fluid to keep your pee (urine) clear or pale yellow.  Eat healthy snacks between healthy meals.  Keep track of carbs that you eat. Read food labels. Learn food serving sizes.  Follow your sick day plan when you cannot eat or drink normally. Make this plan with your doctor so it is ready to use. Activity  Exercise at least 3 times a week.  Do not go more than 2 days without exercising.  Talk with your doctor before you start a new exercise. Your doctor may need to adjust your insulin, medicines, or food. Lifestyle   Do not use any tobacco products. These include cigarettes, chewing tobacco, and e-cigarettes.If you need help quitting, ask your doctor.  Ask your doctor how much alcohol is safe for you.  Learn to deal with stress. If you need help with this, ask your doctor. Body care  Stay up to date with your shots (immunizations).  Have your eyes and feet checked by a doctor as often as told.  Check your skin and feet every day. Check for cuts, bruises, redness, blisters, or sores.  Brush your teeth and gums two times a day.  Floss at least one time a day.  Go to the dentist least one time every 6 months.  Stay at a healthy weight. General instructions   Take over-the-counter and prescription medicines only as told by your doctor.  Share your diabetes care plan with: ? Your work or school. ? People you live with.  Check your pee (urine) for  ketones: ? When you are sick. ? As told by your doctor.  Carry a card or wear jewelry that says that you have diabetes.  Ask your doctor: ? Do I need to meet with a diabetes educator? ? Where can I find a support group for people with diabetes?  Keep all follow-up visits as told by your doctor. This is important. Where to find more information: To learn more about diabetes, visit:  American Diabetes Association: www.diabetes.org  American Association of Diabetes Educators: www.diabeteseducator.org/patient-resources  This information is not intended to replace advice given to you by your health  care provider. Make sure you discuss any questions you have with your health care provider. Document Released: 10/20/2015 Document Revised: 12/04/2015 Document Reviewed: 08/01/2015 Elsevier Interactive Patient Education  2018 [employer].  Hypoglycemia Hypoglycemia occurs when the level of sugar (glucose) in the blood is too low. Glucose is a type of sugar that provides the body's main source of energy. Certain hormones (insulin and glucagon) control the level of glucose in the blood. Insulin lowers blood glucose, and glucagon increases blood glucose. Hypoglycemia can result from having too much insulin in the bloodstream, or from not eating enough food that contains glucose. Hypoglycemia can happen in people who do or do not have diabetes. It can develop quickly, and it can be a medical emergency. What are the causes? Hypoglycemia occurs most often in people who have diabetes. If you have diabetes, hypoglycemia may be caused by:  Diabetes medicine.  Not eating enough, or not eating often enough.  Increased physical activity.  Drinking alcohol, especially when you have not eaten recently.  If you do not have diabetes, hypoglycemia may be caused by:  A tumor in the pancreas. The pancreas is the organ that makes insulin.  Not eating enough, or not eating for long periods at a time  (fasting).  Severe infection or illness that affects the liver, heart, or kidneys.  Certain medicines.  You may also have reactive hypoglycemia. This condition causes hypoglycemia within 4 hours of eating a meal. This may occur after having stomach surgery. Sometimes, the cause of reactive hypoglycemia is not known. What increases the risk? Hypoglycemia is more likely to develop in:  People who have diabetes and take medicines to lower blood glucose.  People who abuse alcohol.  People who have a severe illness.  What are the signs or symptoms? Hypoglycemia may not cause any symptoms. If you have symptoms, they may include:  Hunger.  Anxiety.  Sweating and feeling clammy.  Confusion.  Dizziness or feeling light-headed.  Sleepiness.  Nausea.  Increased heart rate.  Headache.  Blurry vision.  Seizure.  Nightmares.  Tingling or numbness around the mouth, lips, or tongue.  A change in speech.  Decreased ability to concentrate.  A change in coordination.  Restless sleep.  Tremors or shakes.  Fainting.  Irritability.  How is this diagnosed? Hypoglycemia is diagnosed with a blood test to measure your blood glucose level. This blood test is done while you are having symptoms. Your health care provider may also do a physical exam and review your medical history. If you do not have diabetes, other tests may be done to find the cause of your hypoglycemia. How is this treated? This condition can often be treated by immediately eating or drinking something that contains glucose, such as:  3-4 sugar tablets (glucose pills).  Glucose gel, 15-gram tube.  Fruit juice, 4 oz (120 mL).  Regular soda (not diet soda), 4 oz (120 mL).  Low-fat milk, 4 oz (120 mL).  Several pieces of hard candy.  Sugar or honey, 1 Tbsp.  Treating Hypoglycemia If You Have Diabetes  If you are alert and able to swallow safely, follow the 15:15 rule:  Take 15 grams of a  rapid-acting carbohydrate. Rapid-acting options include: ? 1 tube of glucose gel. ? 3 glucose pills. ? 6-8 pieces of hard candy. ? 4 oz (120 mL) of fruit juice. ? 4 oz (120 ml) of regular (not diet) soda.  Check your blood glucose 15 minutes after you take the carbohydrate.  If the  repeat blood glucose level is still at or below 70 mg/dL (3.9 mmol/L), take 15 grams of a carbohydrate again.  If your blood glucose level does not increase above 70 mg/dL (3.9 mmol/L) after 3 tries, seek emergency medical care.  After your blood glucose level returns to normal, eat a meal or a snack within 1 hour.  Treating Severe Hypoglycemia Severe hypoglycemia is when your blood glucose level is at or below 54 mg/dL (3 mmol/L). Severe hypoglycemia is an emergency. Do not wait to see if the symptoms will go away. Get medical help right away. Call your local emergency services (911 in the [place] Do not drive yourself to the hospital. If you have severe hypoglycemia and you cannot eat or drink, you may need an injection of glucagon. A family member or close friend should learn how to check your blood glucose and how to give you a glucagon injection. Ask your health care provider if you need to have an emergency glucagon injection kit available. Severe hypoglycemia may need to be treated in a hospital. The treatment may include getting glucose through an IV tube. You may also need treatment for the cause of your hypoglycemia. Follow these instructions at home: General instructions  Avoid any diets that cause you to not eat enough food. Talk with your health care provider before you start any new diet.  Take over-the-counter and prescription medicines only as told by your health care provider.  Limit alcohol intake to no more than 1 drink per day for nonpregnant women and 2 drinks per day for men. One drink equals 12 oz of beer, 5 oz of wine, or 1 oz of hard liquor.  Keep all follow-up visits as told by your  health care provider. This is important. If You Have Diabetes:   Make sure you know the symptoms of hypoglycemia.  Always have a rapid-acting carbohydrate snack with you to treat low blood sugar.  Follow your diabetes management plan, as told by your health care provider. Make sure you: ? Take your medicines as directed. ? Follow your exercise plan. ? Follow your meal plan. Eat on time, and do not skip meals. ? Check your blood glucose as often as directed. Make sure to check your blood glucose before and after exercise. If you exercise longer or in a different way than usual, check your blood glucose more often. ? Follow your sick day plan whenever you cannot eat or drink normally. Make this plan in advance with your health care provider.  Share your diabetes management plan with people in your workplace, school, and household.  Check your urine for ketones when you are ill and as told by your health care provider.  Carry a medical alert card or wear medical alert jewelry. If You Have Reactive Hypoglycemia or Low Blood Sugar From Other Causes:  Monitor your blood glucose as told by your health care provider.  Follow instructions from your health care provider about eating or drinking restrictions. Contact a health care provider if:  You have problems keeping your blood glucose in your target range.  You have frequent episodes of hypoglycemia. Get help right away if:  You continue to have hypoglycemia symptoms after eating or drinking something containing glucose.  Your blood glucose is at or below 54 mg/dL (3 mmol/L).  You have a seizure.  You faint. These symptoms may represent a serious problem that is an emergency. Do not wait to see if the symptoms will go away. Get medical help  right away. Call your local emergency services (911 in the [place] Do not drive yourself to the hospital. This information is not intended to replace advice given to you by your health care  provider. Make sure you discuss any questions you have with your health care provider. Document Released: 06/28/2005 Document Revised: 12/10/2015 Document Reviewed: 08/01/2015 Elsevier Interactive Patient Education  [employer].    If you have lab work done today you will be contacted with your lab results within the next 2 weeks.  If you have not heard from [place] then please contact us. The fastest way to get your results is to register for My Chart.   IF you received an x-ray today, you will receive an invoice from [facility]. Please contact [facility] at [phone] with questions or concerns regarding your invoice.   IF you received labwork today, you will receive an invoice from [facility]. Please contact [facility] at [phone] with questions or concerns regarding your invoice.   Our billing staff will not be able to assist you with questions regarding bills from these companies.  You will be contacted with the lab results as soon as they are available. The fastest way to get your results is to activate your My Chart account. Instructions are located on the last page of this paperwork. If you have not heard from [place] regarding the results in 2 weeks, please contact this office.      Signed,   Merri Ray, MD [facility].  06/18/18 2:08 PM

## 2018-06-16 NOTE — Patient Instructions (Addendum)
Continue metformin twice per day, start Lantus 10 units/day.  Watch for low blood sugar symptoms as as listed below.  Meeting with nutritionist may help as well.  See other information on diabetes and self-care along with goal blood sugar readings.  Give me an update by MyChart in the next few weeks, follow-up office visit 6 weeks. Return to the clinic or go to the nearest emergency room if any of your symptoms worsen or new symptoms occur.   Type 2 Diabetes Mellitus, Self Care, Adult When you have type 2 diabetes (type 2 diabetes mellitus), you must keep your blood sugar (glucose) under control. You can do this with:  Nutrition.  Exercise.  Lifestyle changes.  Medicines or insulin, if needed.  Support from your doctors and others.  How do I manage my blood sugar?  Check your blood sugar level every day, as often as told.  Call your doctor if your blood sugar is above your goal numbers for 2 tests in a row.  Have your A1c (hemoglobin A1c) level checked at least two times a year. Have it checked more often if your doctor tells you to. Your doctor will set treatment goals for you. Generally, you should have these blood sugar levels:  Before meals (preprandial): 80-130 mg/dL (4.4-7.2 mmol/L).  After meals (postprandial): lower than 180 mg/dL (10 mmol/L).  A1c level: less than 7%.  What do I need to know about high blood sugar? High blood sugar is called hyperglycemia. Know the signs of high blood sugar. Signs may include:  Feeling: ? Thirsty. ? Hungry. ? Very tired.  Needing to pee (urinate) more than usual.  Blurry vision.  What do I need to know about low blood sugar? Low blood sugar is called hypoglycemia. This is when blood sugar is at or below 70 mg/dL (3.9 mmol/L). Symptoms may include:  Feeling: ? Hungry. ? Worried or nervous (anxious). ? Sweaty and clammy. ? Confused. ? Dizzy. ? Sleepy. ? Sick to your stomach (nauseous).  Having: ? A fast heartbeat  (palpitations). ? A headache. ? A change in your vision. ? Jerky movements that you cannot control (seizure). ? Nightmares. ? Tingling or no feeling (numbness) around the mouth, lips, or tongue.  Having trouble with: ? Talking. ? Paying attention (concentrating). ? Moving (coordination). ? Sleeping.  Shaking.  Passing out (fainting).  Getting upset easily (irritability).  Treating low blood sugar  To treat low blood sugar, eat or drink something sugary right away. If you can think clearly and swallow safely, follow the 15:15 rule:  Take 15 grams of a fast-acting carb (carbohydrate). Some fast-acting carbs are: ? 1 tube of glucose gel. ? 3 sugar tablets (glucose pills). ? 6-8 pieces of hard candy. ? 4 oz (120 mL) of fruit juice. ? 4 oz (120 mL) regular (not diet) soda.  Check your blood sugar 15 minutes after you take the carb.  If your blood sugar is still at or below 70 mg/dL (3.9 mmol/L), take 15 grams of a carb again.  If your blood sugar does not go above 70 mg/dL (3.9 mmol/L) after 3 tries, get help right away.  After your blood sugar goes back to normal, eat a meal or a snack within 1 hour.  Treating very low blood sugar If your blood sugar is at or below 54 mg/dL (3 mmol/L), you have very low blood sugar (severe hypoglycemia). This is an emergency. Do not wait to see if the symptoms will go away. Get medical help  right away. Call your local emergency services (911 in the [place] Do not drive yourself to the hospital. If you have very low blood sugar and you cannot eat or drink, you may need a glucagon shot (injection). A family member or friend should learn how to check your blood sugar and how to give you a glucagon shot. Ask your doctor if you need to have a glucagon shot kit at home. What else is important to manage my diabetes? Medicine Follow these instructions about insulin and diabetes medicines:  Take them as told by your doctor.  Adjust them as told by  your doctor.  Do not run out of them.  Having diabetes can raise your risk for other long-term conditions. These include heart or kidney disease. Your doctor may prescribe medicines to help prevent problems from diabetes. Food   Make healthy food choices. These include: ? Chicken, fish, egg whites, and beans. ? Oats, whole wheat, bulgur, brown rice, quinoa, and millet. ? Fresh fruits and vegetables. ? Low-fat dairy products. ? Nuts, avocado, olive oil, and canola oil.  Make a food plan with a specialist (dietitian).  Follow instructions from your doctor about what you cannot eat or drink.  Drink enough fluid to keep your pee (urine) clear or pale yellow.  Eat healthy snacks between healthy meals.  Keep track of carbs that you eat. Read food labels. Learn food serving sizes.  Follow your sick day plan when you cannot eat or drink normally. Make this plan with your doctor so it is ready to use. Activity  Exercise at least 3 times a week.  Do not go more than 2 days without exercising.  Talk with your doctor before you start a new exercise. Your doctor may need to adjust your insulin, medicines, or food. Lifestyle   Do not use any tobacco products. These include cigarettes, chewing tobacco, and e-cigarettes.If you need help quitting, ask your doctor.  Ask your doctor how much alcohol is safe for you.  Learn to deal with stress. If you need help with this, ask your doctor. Body care  Stay up to date with your shots (immunizations).  Have your eyes and feet checked by a doctor as often as told.  Check your skin and feet every day. Check for cuts, bruises, redness, blisters, or sores.  Brush your teeth and gums two times a day.  Floss at least one time a day.  Go to the dentist least one time every 6 months.  Stay at a healthy weight. General instructions   Take over-the-counter and prescription medicines only as told by your doctor.  Share your diabetes care  plan with: ? Your work or school. ? People you live with.  Check your pee (urine) for ketones: ? When you are sick. ? As told by your doctor.  Carry a card or wear jewelry that says that you have diabetes.  Ask your doctor: ? Do I need to meet with a diabetes educator? ? Where can I find a support group for people with diabetes?  Keep all follow-up visits as told by your doctor. This is important. Where to find more information: To learn more about diabetes, visit:  American Diabetes Association: www.diabetes.org  American Association of Diabetes Educators: www.diabeteseducator.org/patient-resources  This information is not intended to replace advice given to you by your health care provider. Make sure you discuss any questions you have with your health care provider. Document Released: 10/20/2015 Document Revised: 12/04/2015 Document Reviewed: 08/01/2015 Elsevier Interactive  Patient Education  2018 [employer].  Hypoglycemia Hypoglycemia occurs when the level of sugar (glucose) in the blood is too low. Glucose is a type of sugar that provides the body's main source of energy. Certain hormones (insulin and glucagon) control the level of glucose in the blood. Insulin lowers blood glucose, and glucagon increases blood glucose. Hypoglycemia can result from having too much insulin in the bloodstream, or from not eating enough food that contains glucose. Hypoglycemia can happen in people who do or do not have diabetes. It can develop quickly, and it can be a medical emergency. What are the causes? Hypoglycemia occurs most often in people who have diabetes. If you have diabetes, hypoglycemia may be caused by:  Diabetes medicine.  Not eating enough, or not eating often enough.  Increased physical activity.  Drinking alcohol, especially when you have not eaten recently.  If you do not have diabetes, hypoglycemia may be caused by:  A tumor in the pancreas. The pancreas is the organ  that makes insulin.  Not eating enough, or not eating for long periods at a time (fasting).  Severe infection or illness that affects the liver, heart, or kidneys.  Certain medicines.  You may also have reactive hypoglycemia. This condition causes hypoglycemia within 4 hours of eating a meal. This may occur after having stomach surgery. Sometimes, the cause of reactive hypoglycemia is not known. What increases the risk? Hypoglycemia is more likely to develop in:  People who have diabetes and take medicines to lower blood glucose.  People who abuse alcohol.  People who have a severe illness.  What are the signs or symptoms? Hypoglycemia may not cause any symptoms. If you have symptoms, they may include:  Hunger.  Anxiety.  Sweating and feeling clammy.  Confusion.  Dizziness or feeling light-headed.  Sleepiness.  Nausea.  Increased heart rate.  Headache.  Blurry vision.  Seizure.  Nightmares.  Tingling or numbness around the mouth, lips, or tongue.  A change in speech.  Decreased ability to concentrate.  A change in coordination.  Restless sleep.  Tremors or shakes.  Fainting.  Irritability.  How is this diagnosed? Hypoglycemia is diagnosed with a blood test to measure your blood glucose level. This blood test is done while you are having symptoms. Your health care provider may also do a physical exam and review your medical history. If you do not have diabetes, other tests may be done to find the cause of your hypoglycemia. How is this treated? This condition can often be treated by immediately eating or drinking something that contains glucose, such as:  3-4 sugar tablets (glucose pills).  Glucose gel, 15-gram tube.  Fruit juice, 4 oz (120 mL).  Regular soda (not diet soda), 4 oz (120 mL).  Low-fat milk, 4 oz (120 mL).  Several pieces of hard candy.  Sugar or honey, 1 Tbsp.  Treating Hypoglycemia If You Have Diabetes  If you are alert  and able to swallow safely, follow the 15:15 rule:  Take 15 grams of a rapid-acting carbohydrate. Rapid-acting options include: ? 1 tube of glucose gel. ? 3 glucose pills. ? 6-8 pieces of hard candy. ? 4 oz (120 mL) of fruit juice. ? 4 oz (120 ml) of regular (not diet) soda.  Check your blood glucose 15 minutes after you take the carbohydrate.  If the repeat blood glucose level is still at or below 70 mg/dL (3.9 mmol/L), take 15 grams of a carbohydrate again.  If your blood glucose level  does not increase above 70 mg/dL (3.9 mmol/L) after 3 tries, seek emergency medical care.  After your blood glucose level returns to normal, eat a meal or a snack within 1 hour.  Treating Severe Hypoglycemia Severe hypoglycemia is when your blood glucose level is at or below 54 mg/dL (3 mmol/L). Severe hypoglycemia is an emergency. Do not wait to see if the symptoms will go away. Get medical help right away. Call your local emergency services (911 in the [place] Do not drive yourself to the hospital. If you have severe hypoglycemia and you cannot eat or drink, you may need an injection of glucagon. A family member or close friend should learn how to check your blood glucose and how to give you a glucagon injection. Ask your health care provider if you need to have an emergency glucagon injection kit available. Severe hypoglycemia may need to be treated in a hospital. The treatment may include getting glucose through an IV tube. You may also need treatment for the cause of your hypoglycemia. Follow these instructions at home: General instructions  Avoid any diets that cause you to not eat enough food. Talk with your health care provider before you start any new diet.  Take over-the-counter and prescription medicines only as told by your health care provider.  Limit alcohol intake to no more than 1 drink per day for nonpregnant women and 2 drinks per day for men. One drink equals 12 oz of beer, 5 oz of wine,  or 1 oz of hard liquor.  Keep all follow-up visits as told by your health care provider. This is important. If You Have Diabetes:   Make sure you know the symptoms of hypoglycemia.  Always have a rapid-acting carbohydrate snack with you to treat low blood sugar.  Follow your diabetes management plan, as told by your health care provider. Make sure you: ? Take your medicines as directed. ? Follow your exercise plan. ? Follow your meal plan. Eat on time, and do not skip meals. ? Check your blood glucose as often as directed. Make sure to check your blood glucose before and after exercise. If you exercise longer or in a different way than usual, check your blood glucose more often. ? Follow your sick day plan whenever you cannot eat or drink normally. Make this plan in advance with your health care provider.  Share your diabetes management plan with people in your workplace, school, and household.  Check your urine for ketones when you are ill and as told by your health care provider.  Carry a medical alert card or wear medical alert jewelry. If You Have Reactive Hypoglycemia or Low Blood Sugar From Other Causes:  Monitor your blood glucose as told by your health care provider.  Follow instructions from your health care provider about eating or drinking restrictions. Contact a health care provider if:  You have problems keeping your blood glucose in your target range.  You have frequent episodes of hypoglycemia. Get help right away if:  You continue to have hypoglycemia symptoms after eating or drinking something containing glucose.  Your blood glucose is at or below 54 mg/dL (3 mmol/L).  You have a seizure.  You faint. These symptoms may represent a serious problem that is an emergency. Do not wait to see if the symptoms will go away. Get medical help right away. Call your local emergency services (911 in the [place] Do not drive yourself to the hospital. This information is  not intended to replace  advice given to you by your health care provider. Make sure you discuss any questions you have with your health care provider. Document Released: 06/28/2005 Document Revised: 12/10/2015 Document Reviewed: 08/01/2015 Elsevier Interactive Patient Education  [employer].    If you have lab work done today you will be contacted with your lab results within the next 2 weeks.  If you have not heard from [place] then please contact us. The fastest way to get your results is to register for My Chart.   IF you received an x-ray today, you will receive an invoice from [facility]. Please contact [facility] at [phone] with questions or concerns regarding your invoice.   IF you received labwork today, you will receive an invoice from [place]. Please contact [facility] at [phone] with questions or concerns regarding your invoice.   Our billing staff will not be able to assist you with questions regarding bills from these companies.  You will be contacted with the lab results as soon as they are available. The fastest way to get your results is to activate your My Chart account. Instructions are located on the last page of this paperwork. If you have not heard from [place] regarding the results in 2 weeks, please contact this office.

## 2018-06-28 DIAGNOSIS — E118 Type 2 diabetes mellitus with unspecified complications: Secondary | ICD-10-CM | POA: Diagnosis not present

## 2018-06-28 DIAGNOSIS — Z794 Long term (current) use of insulin: Secondary | ICD-10-CM | POA: Diagnosis not present

## 2018-06-29 DIAGNOSIS — J069 Acute upper respiratory infection, unspecified: Secondary | ICD-10-CM | POA: Diagnosis not present

## 2018-07-01 ENCOUNTER — Encounter: Payer: Self-pay | Admitting: Family Medicine

## 2018-07-18 ENCOUNTER — Ambulatory Visit (INDEPENDENT_AMBULATORY_CARE_PROVIDER_SITE_OTHER): Payer: BLUE CROSS/BLUE SHIELD | Admitting: Psychiatry

## 2018-07-18 DIAGNOSIS — F330 Major depressive disorder, recurrent, mild: Secondary | ICD-10-CM | POA: Diagnosis not present

## 2018-07-18 DIAGNOSIS — F411 Generalized anxiety disorder: Secondary | ICD-10-CM | POA: Diagnosis not present

## 2018-07-18 MED ORDER — SERTRALINE HCL 100 MG PO TABS: ORAL_TABLET | ORAL | 0 refills | Status: DC

## 2018-07-18 MED ORDER — HYDROXYZINE PAMOATE 50 MG PO CAPS: ORAL_CAPSULE | ORAL | 0 refills | Status: DC

## 2018-07-18 MED ORDER — TRAZODONE HCL 100 MG PO TABS: ORAL_TABLET | ORAL | 0 refills | Status: DC

## 2018-07-18 NOTE — Progress Notes (Signed)
BH MD/PA/NP OP Progress Note  07/18/2018 3:09 PM Matthew Hutchinson  MRN:  [id number]  Chief Complaint: I am doing fine.  I am taking my medication.  HPI: Matthew Hutchinson came for his follow appointment.  He is taking his medication as prescribed.  He gained weight since last seen.  Now he is taking insulin to help his blood sugar.  His last hemoglobin A1c was 14.  He admitted going on insulin was a difficult decision but he need to take it for his health.  Overall he describes his mood is good.  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  He visited his mother on Christmas who lives in [place].  He endorsed better relationship with his partner.  He is sleeping good.  He is using CPAP machine which is helping his sleep.  He denies any major anger issues or any severe mood swings.  Though he is still not happy with the political situation but he does not see anything in public.  Patient denies any paranoia, hallucination or any suicidal thoughts.  He is not drinking or using any illegal substances.  He likes to continue his current medication.  Visit Diagnosis:    ICD-10-CM   1. Major depressive disorder, recurrent episode, mild (HCC) F33.0 traZODone (DESYREL) 100 MG tablet    sertraline (ZOLOFT) 100 MG tablet  2. GAD (generalized anxiety disorder) F41.1 sertraline (ZOLOFT) 100 MG tablet    Past Psychiatric History: Reviewed. History of inpatient at behavioral health center after taking overdose on Ambien.  History of taking overdose on Ambien 2 other times but did not require inpatient treatment.  History of mood swing, impulsive behavior, speeding ticket, anger, road rage.  No history of psychosis or hallucination.  Past Medical History:  Past Medical History:  Diagnosis Date  . Allergy   . Anxiety   . Arthritis   . COPD (chronic obstructive pulmonary disease) ([place])   . Depression   . Emphysema of lung ([place])   . Hyperlipidemia   . Morbid obesity ([place])   . OSA (obstructive sleep apnea)    uses CPAP  nightly  . PONV (postoperative nausea and vomiting)   . Unspecified essential hypertension     Past Surgical History:  Procedure Laterality Date  . INSERTION OF MESH N/A 09/18/2015   Procedure: INSERTION OF MESH;  Surgeon: Coralie Keens, MD;  Location: [facility];  Service: General;  Laterality: N/A;  . LEG SURGERY Left   . SHOULDER ARTHROSCOPY Left   . TONSILLECTOMY    . UMBILICAL HERNIA REPAIR N/A 09/18/2015   Procedure: HERNIA REPAIR UMBILICAL ADULT WITH MESH ;  Surgeon: Coralie Keens, MD;  Location: [facility];  Service: General;  Laterality: N/A;    Family Psychiatric History: Viewed.  Family History:  Family History  Problem Relation Age of Onset  . Ovarian cancer Mother   . Breast cancer Mother   . Macular degeneration Mother   . Alcohol abuse Mother   . Arthritis Mother   . Emphysema Paternal Grandfather   . Cancer - Other Father        gallbladder cancer  . Alcohol abuse Father   . Cancer Maternal Grandmother   . Heart disease Maternal Grandfather   . Emphysema Paternal Grandmother   . Hyperlipidemia Brother   . Hypertension Brother   . Lung cancer Paternal Uncle     Social History:  Social History   Socioeconomic History  . Marital status: Married    Spouse name: Not on  file  . Number of children: 0  . Years of education: Not on file  . Highest education level: Not on file  Occupational History  . Occupation: Psychologist, prison and probation services  Social Needs  . Financial resource strain: Not on file  . Food insecurity:    Worry: Not on file    Inability: Not on file  . Transportation needs:    Medical: Not on file    Non-medical: Not on file  Tobacco Use  . Smoking status: Former Smoker    Packs/day: 2.00    Years: 26.00    Pack years: 52.00    Types: Cigarettes    Last attempt to quit: 08/13/2003    Years since quitting: 14.9  . Smokeless tobacco: Never Used  . Tobacco comment: 2ppd x 26 years  Substance and Sexual Activity  .  Alcohol use: No    Alcohol/week: 0.0 standard drinks    Comment: social  . Drug use: No  . Sexual activity: Yes    Partners: Male    Birth control/protection: None  Lifestyle  . Physical activity:    Days per week: Not on file    Minutes per session: Not on file  . Stress: Not on file  Relationships  . Social connections:    Talks on phone: Not on file    Gets together: Not on file    Attends religious service: Not on file    Active member of club or organization: Not on file    Attends meetings of clubs or organizations: Not on file    Relationship status: Not on file  Other Topics Concern  . Not on file  Social History Narrative   Married. Education: college. Exercise: No.    Allergies:  Allergies  Allergen Reactions  . Penicillins Anaphylaxis    REACTION: anaphylaxis  . Valsartan     REACTION: itch/swelling    Metabolic Disorder Labs: Lab Results  Component Value Date   HGBA1C 14.0 (A) 05/09/2018   MPG 143 06/10/2016   MPG 140 03/11/2016   No results found for: PROLACTIN Lab Results  Component Value Date   CHOL 179 05/09/2018   TRIG 206 (H) 05/09/2018   HDL 39 (L) 05/09/2018   CHOLHDL 4.6 05/09/2018   VLDL 22 06/10/2016   LDLCALC 99 05/09/2018   LDLCALC 119 (H) 11/04/2017   Lab Results  Component Value Date   TSH 0.697 05/23/2014   TSH 0.819 10/16/2012    Therapeutic Level Labs: No results found for: LITHIUM No results found for: VALPROATE No components found for:  CBMZ  Current Medications: Current Outpatient Medications  Medication Sig Dispense Refill  . amLODipine (NORVASC) 5 MG tablet Take 1 tablet (5 mg total) by mouth daily. 90 tablet 2  . aspirin 81 MG tablet Take 1 tablet (81 mg total) by mouth daily. 30 tablet 0  . b complex vitamins tablet Take 1 tablet by mouth daily.    . blood glucose meter kit and supplies Dispense based on patient and insurance preference. Use 2-3 times per day. 1 each 0  . calcium gluconate 500 MG tablet Take  500 mg by mouth daily.    . Cholecalciferol (VITAMIN D) 2000 UNITS tablet Take 1 tablet (2,000 Units total) by mouth daily. 30 tablet 0  . gabapentin (NEURONTIN) 300 MG capsule Take 2 capsules (600 mg total) by mouth 2 (two) times daily. 360 capsule 2  . hydrOXYzine (VISTARIL) 50 MG capsule TAKE 2 BY MOUTH AT BEDTIME 180 capsule 0  .  Insulin Glargine (LANTUS SOLOSTAR) 100 UNIT/ML Solostar Pen Inject 10 Units into the skin daily. Pharm req 30 day supply instead of 90. 1 pen 5  . Insulin Pen Needle (PEN NEEDLES) 32G X 4 MM MISC 1 application by Does not apply route daily. 100 each 3  . losartan-hydrochlorothiazide (HYZAAR) 100-25 MG tablet Take 1 tablet by mouth daily. 90 tablet 2  . Lutein 40 MG CAPS Take 1 capsule by mouth daily.    . magnesium gluconate (MAGONATE) 500 MG tablet Take 500 mg by mouth daily.    . metFORMIN (GLUCOPHAGE) 500 MG tablet Take 1 tablet (500 mg total) by mouth 2 (two) times daily with a meal. 180 tablet 1  . Multiple Vitamin (MULTIVITAMIN) capsule Take 1 capsule by mouth daily. 30 capsule 0  . nitroGLYCERIN (NITROSTAT) 0.4 MG SL tablet Place 1 tablet (0.4 mg total) under the tongue every 5 (five) minutes as needed for chest pain. If chest pain not resolved, after 3 doses 5 minutes apart--call 911 30 tablet 0  . Potassium 99 MG TABS Take 1 tablet by mouth daily.    . sertraline (ZOLOFT) 100 MG tablet TAKE 1 TABLET BY MOUTH DAILY 90 tablet 0  . Shark Cartilage 740 MG CAPS Take 1 capsule by mouth 3 (three) times daily.    . simvastatin (ZOCOR) 20 MG tablet TAKE 1 TABLET BY MOUTH AT BEDTIME, 90 tablet 2  . traMADol (ULTRAM) 50 MG tablet Take 50 mg by mouth every 6 (six) hours as needed for pain.    . traZODone (DESYREL) 100 MG tablet TAKE 1 BY MOUTH AT BEDTIME 90 tablet 0   No current facility-administered medications for this visit.      Musculoskeletal: Strength & Muscle Tone: within normal limits Gait & Station: normal Patient leans: N/A  Psychiatric Specialty  Exam: ROS  Blood pressure 124/83, pulse 84, height '6\' 2"'$  (1.88 m), weight (!) 328 lb (148.8 kg).Body mass index is 42.11 kg/m.  General Appearance: Casual  Eye Contact:  Good  Speech:  Clear and Coherent  Volume:  Normal  Mood:  Anxious  Affect:  Congruent  Thought Process:  Goal Directed  Orientation:  Full (Time, Place, and Person)  Thought Content: Logical   Suicidal Thoughts:  No  Homicidal Thoughts:  No  Memory:  Immediate;   Good Recent;   Good Remote;   Good  Judgement:  Good  Insight:  Good  Psychomotor Activity:  Normal  Concentration:  Concentration: Good and Attention Span: Good  Recall:  Good  Fund of Knowledge: Good  Language: Good  Akathisia:  No  Handed:  Right  AIMS (if indicated): not done  Assets:  Communication Skills [facility] for [place] Talents/Skills  ADL's:  Intact  Cognition: WNL  Sleep:  Good   Screenings: AUDIT     Admission (Discharged) from 11/08/2012 in [facility]  Alcohol Use Disorder Identification Test Final Score (AUDIT)  1    PHQ2-9     Office Visit from 06/16/2018 in Primary Care at [facility] from 05/09/2018 in Primary Care at [facility] from 11/04/2017 in Primary Care at [facility] from 12/18/2016 in Primary Care at [facility] from 06/10/2016 in Primary Care at [facility] Total Score  0  0  0  0  3  PHQ-9 Total Score  -  -  -  -  13       Assessment and Plan: Major  depressive disorder, recurrent.  Generalized anxiety disorder.  Patient is a stable on his current psychiatric medication.  Encouraged to keep appointment with his physician for better management of diabetes.  Discussed healthy lifestyle and watch his calorie intake.  Patient is not interested in counseling.  Continue Vistaril 100 mg daily and Zoloft 1 mg daily and trazodone 100 g at bedtime.  Recommended to call us back if he has any question or any concern.   Follow-up in 3 months.   Kathlee Nations, MD 07/18/2018, 3:09 PM

## 2018-07-19 ENCOUNTER — Encounter: Payer: Self-pay | Admitting: Family Medicine

## 2018-07-19 ENCOUNTER — Ambulatory Visit: Payer: BLUE CROSS/BLUE SHIELD | Admitting: Family Medicine

## 2018-07-19 ENCOUNTER — Other Ambulatory Visit: Payer: Self-pay

## 2018-07-19 ENCOUNTER — Telehealth: Payer: Self-pay

## 2018-07-19 VITALS — BP 135/86 | HR 101 | Temp 98.7°F | Resp 16 | Ht 74.5 in | Wt 330.2 lb

## 2018-07-19 DIAGNOSIS — E1165 Type 2 diabetes mellitus with hyperglycemia: Secondary | ICD-10-CM

## 2018-07-19 LAB — GLUCOSE, POCT (MANUAL RESULT ENTRY): POC Glucose: 198 mg/dl — AB (ref 70–99)

## 2018-07-19 MED ORDER — INSULIN GLARGINE 100 UNIT/ML SOLOSTAR PEN: 46.0000 [IU] | PEN_INJECTOR | Freq: Every day | SUBCUTANEOUS | 5 refills | Status: DC

## 2018-07-19 NOTE — Progress Notes (Signed)
Subjective:    Patient ID: Matthew Hutchinson, male    DOB: 20-Mar-1961, 58 y.o.   MRN: [id number]  HPI JOREN REHM is a 58 y.o. male Presents today for: Chief Complaint  Patient presents with  . Diabetes    here today to recheck some diabetic numbers, has increased insulin to 46 units a day, at this time blood sugar this am was 133   Diabetes:  Increase in A1c with reading of 14 October.  Recommend starting insulin at that time.  Last seen December 6.  Some difficulty with filling medications initially, but started after December visit.  Had also cut back on slushy's with sugar and fruit.  Was tolerating metformin twice daily.  Plan on meeting with nutritionist.  Update through my chart on December 21.  Up to 26 units of insulin at that time.  Now on 46 units per day as of yesterday Blood sugar 133 fasting today (fasting 7day avg 177, 14 d 161, 30d 183). 2hr PP 150-170.  No reading over 200 in weeks.  No symptomatic lows. Lowest 121.  Still on metformin BID. No new GI intolerance.  Optho visit planned in March. Plans on cataract surgery later this year.  nutritionist appt next month.   Lab Results  Component Value Date   HGBA1C 14.0 (A) 05/09/2018   HGBA1C 11.3 (H) 11/04/2017   HGBA1C 6.6 (H) 06/10/2016   Lab Results  Component Value Date   MICROALBUR 0.6 06/10/2016   [place] 99 05/09/2018   CREATININE 0.85 05/09/2018    Patient Active Problem List   Diagnosis Date Noted  . Morbid obesity ([place]) 02/02/2017  . Chronic pain due to trauma 06/25/2013  . Depressive disorder, not elsewhere classified 11/09/2012  . CAD (coronary artery disease) 02/21/2012  . Lipid disorder 02/21/2012  . Angina pectoris 09/27/2011  . Obstructive sleep apnea 05/20/2010  . Essential hypertension 04/17/2010  . EMPHYSEMA 04/17/2010  . WEIGHT GAIN, ABNORMAL 04/17/2010   Past Medical History:  Diagnosis Date  . Allergy   . Anxiety   . Arthritis   . COPD (chronic obstructive  pulmonary disease) ([place])   . Depression   . Emphysema of lung ([place])   . Hyperlipidemia   . Morbid obesity ([place])   . OSA (obstructive sleep apnea)    uses CPAP nightly  . PONV (postoperative nausea and vomiting)   . Unspecified essential hypertension    Past Surgical History:  Procedure Laterality Date  . INSERTION OF MESH N/A 09/18/2015   Procedure: INSERTION OF MESH;  Surgeon: Coralie Keens, MD;  Location: [facility];  Service: General;  Laterality: N/A;  . LEG SURGERY Left   . SHOULDER ARTHROSCOPY Left   . TONSILLECTOMY    . UMBILICAL HERNIA REPAIR N/A 09/18/2015   Procedure: HERNIA REPAIR UMBILICAL ADULT WITH MESH ;  Surgeon: Coralie Keens, MD;  Location: [facility];  Service: General;  Laterality: N/A;   Allergies  Allergen Reactions  . Penicillins Anaphylaxis    REACTION: anaphylaxis  . Valsartan     REACTION: itch/swelling   Prior to Admission medications   Medication Sig Start Date End Date Taking? Authorizing Provider  amLODipine (NORVASC) 5 MG tablet Take 1 tablet (5 mg total) by mouth daily. 05/09/18  Yes Wendie Agreste, MD  aspirin 81 MG tablet Take 1 tablet (81 mg total) by mouth daily. 11/13/12  Yes Patrecia Pour, NP  b complex vitamins tablet Take 1 tablet by mouth daily.  Yes [provider]  blood glucose meter kit and supplies Dispense based on patient and insurance preference. Use 2-3 times per day. 05/09/18  Yes Wendie Agreste, MD  calcium gluconate 500 MG tablet Take 500 mg by mouth daily.   Yes [provider]  Cholecalciferol (VITAMIN D) 2000 UNITS tablet Take 1 tablet (2,000 Units total) by mouth daily. 11/13/12  Yes Patrecia Pour, NP  gabapentin (NEURONTIN) 300 MG capsule Take 2 capsules (600 mg total) by mouth 2 (two) times daily. 11/04/17  Yes Wendie Agreste, MD  hydrOXYzine (VISTARIL) 50 MG capsule TAKE 2 BY MOUTH AT BEDTIME 07/18/18  Yes Arfeen, Arlyce Harman, MD  Insulin Glargine (LANTUS SOLOSTAR) 100  UNIT/ML Solostar Pen Inject 10 Units into the skin daily. Pharm req 30 day supply instead of 90. 05/28/18  Yes Wendie Agreste, MD  Insulin Pen Needle (PEN NEEDLES) 32G X 4 MM MISC 1 application by Does not apply route daily. 05/09/18  Yes Wendie Agreste, MD  losartan-hydrochlorothiazide (HYZAAR) 100-25 MG tablet Take 1 tablet by mouth daily. 05/09/18  Yes Wendie Agreste, MD  Lutein 40 MG CAPS Take 1 capsule by mouth daily.   Yes [provider]  magnesium gluconate (MAGONATE) 500 MG tablet Take 500 mg by mouth daily.   Yes [provider]  metFORMIN (GLUCOPHAGE) 500 MG tablet Take 1 tablet (500 mg total) by mouth 2 (two) times daily with a meal. 05/09/18  Yes Wendie Agreste, MD  Multiple Vitamin (MULTIVITAMIN) capsule Take 1 capsule by mouth daily. 11/13/12  Yes Lord, Asa Saunas, NP  nitroGLYCERIN (NITROSTAT) 0.4 MG SL tablet Place 1 tablet (0.4 mg total) under the tongue every 5 (five) minutes as needed for chest pain. If chest pain not resolved, after 3 doses 5 minutes apart--call 911 11/13/12  Yes Patrecia Pour, NP  Potassium 99 MG TABS Take 1 tablet by mouth daily.   Yes [provider]  sertraline (ZOLOFT) 100 MG tablet TAKE 1 TABLET BY MOUTH DAILY 07/18/18  Yes Arfeen, Arlyce Harman, MD  Shark Cartilage 740 MG CAPS Take 1 capsule by mouth 3 (three) times daily.   Yes [provider]  simvastatin (ZOCOR) 20 MG tablet TAKE 1 TABLET BY MOUTH AT BEDTIME, 05/09/18  Yes Wendie Agreste, MD  traMADol (ULTRAM) 50 MG tablet Take 50 mg by mouth every 6 (six) hours as needed for pain.   Yes [provider]  traZODone (DESYREL) 100 MG tablet TAKE 1 BY MOUTH AT BEDTIME 07/18/18  Yes Arfeen, Arlyce Harman, MD   Social History   Socioeconomic History  . Marital status: Married    Spouse name: Not on file  . Number of children: 0  . Years of education: Not on file  . Highest education level: Not on file  Occupational History  . Occupation: Psychologist, prison and probation services  Social  Needs  . Financial resource strain: Not on file  . Food insecurity:    Worry: Not on file    Inability: Not on file  . Transportation needs:    Medical: Not on file    Non-medical: Not on file  Tobacco Use  . Smoking status: Former Smoker    Packs/day: 2.00    Years: 26.00    Pack years: 52.00    Types: Cigarettes    Last attempt to quit: 08/13/2003    Years since quitting: 14.9  . Smokeless tobacco: Never Used  . Tobacco comment: 2ppd x 26 years  Substance and  Sexual Activity  . Alcohol use: No    Alcohol/week: 0.0 standard drinks    Comment: social  . Drug use: No  . Sexual activity: Yes    Partners: Male    Birth control/protection: None  Lifestyle  . Physical activity:    Days per week: Not on file    Minutes per session: Not on file  . Stress: Not on file  Relationships  . Social connections:    Talks on phone: Not on file    Gets together: Not on file    Attends religious service: Not on file    Active member of club or organization: Not on file    Attends meetings of clubs or organizations: Not on file    Relationship status: Not on file  . Intimate partner violence:    Fear of current or ex partner: Not on file    Emotionally abused: Not on file    Physically abused: Not on file    Forced sexual activity: Not on file  Other Topics Concern  . Not on file  Social History Narrative   Married. Education: college. Exercise: No.    Review of Systems  Constitutional: Negative for fatigue and unexpected weight change.  Eyes: Negative for visual disturbance.  Respiratory: Negative for cough, chest tightness and shortness of breath.   Cardiovascular: Negative for chest pain, palpitations and leg swelling.  Gastrointestinal: Negative for abdominal pain and blood in stool.  Neurological: Negative for dizziness, light-headedness and headaches.       Objective:   Physical Exam Vitals signs reviewed.  Constitutional:      Appearance: He is well-developed.    HENT:     Head: Normocephalic and atraumatic.  Eyes:     Pupils: Pupils are equal, round, and reactive to light.  Neck:     Vascular: No carotid bruit or JVD.  Cardiovascular:     Rate and Rhythm: Normal rate and regular rhythm.     Heart sounds: Normal heart sounds. No murmur.  Pulmonary:     Effort: Pulmonary effort is normal.     Breath sounds: Normal breath sounds. No rales.  Skin:    General: Skin is warm and dry.  Neurological:     Mental Status: He is alert and oriented to person, place, and time.    Vitals:   07/19/18 1416  BP: 135/86  Pulse: (!) 101  Resp: 16  Temp: 98.7 F (37.1 C)  TempSrc: Oral  SpO2: 93%  Weight: (!) 330 lb 3.2 oz (149.8 kg)  Height: 6' 2.5" (1.892 m)      Assessment & Plan:   Matthew Hutchinson is a 58 y.o. male Type 2 diabetes mellitus with hyperglycemia, without long-term current use of insulin (HCC) - Plan: POCT glucose (manual entry), Insulin Glargine (LANTUS SOLOSTAR) 100 UNIT/ML Solostar Pen, Hemoglobin A1c, Comprehensive metabolic panel  -Improving, tolerating Lantus at higher dose as well as metformin without hypoglycemia.  Commended on diet changes.  Return in 1 month for A1c only/CMP only, and office visit in 3 months.  Refills of Lantus sent to pharmacy.  Meds ordered this encounter  Medications  . Insulin Glargine (LANTUS SOLOSTAR) 100 UNIT/ML Solostar Pen    Sig: Inject 46 Units into the skin daily. Pharm req 30 day supply instead of 90.    Dispense:  5 pen    Refill:  5   Patient Instructions   Glad to see the numbers are improving.  Continue same dose of Lantus  as well as metformin for now.  Return in 1 month for lab only visit.  Thank you for coming in today.  Follow-up with me in 3 months.  If you have lab work done today you will be contacted with your lab results within the next 2 weeks.  If you have not heard from [place] then please contact us. The fastest way to get your results is to register for My Chart.   IF  you received an x-ray today, you will receive an invoice from [facility]. Please contact [facility] at [phone] with questions or concerns regarding your invoice.   IF you received labwork today, you will receive an invoice from [place]. Please contact [facility] at [phone] with questions or concerns regarding your invoice.   Our billing staff will not be able to assist you with questions regarding bills from these companies.  You will be contacted with the lab results as soon as they are available. The fastest way to get your results is to activate your My Chart account. Instructions are located on the last page of this paperwork. If you have not heard from [place] regarding the results in 2 weeks, please contact this office.       Signed,   Merri Ray, MD [facility] at [facility].  07/19/18 3:25 PM

## 2018-07-19 NOTE — Patient Instructions (Addendum)
Glad to see the numbers are improving.  Continue same dose of Lantus as well as metformin for now.  Return in 1 month for lab only visit.  Thank you for coming in today.  Follow-up with me in 3 months.  If you have lab work done today you will be contacted with your lab results within the next 2 weeks.  If you have not heard from [place] then please contact us. The fastest way to get your results is to register for My Chart.   IF you received an x-ray today, you will receive an invoice from [facility]. Please contact [facility] at [phone] with questions or concerns regarding your invoice.   IF you received labwork today, you will receive an invoice from [place]. Please contact [facility] at [phone] with questions or concerns regarding your invoice.   Our billing staff will not be able to assist you with questions regarding bills from these companies.  You will be contacted with the lab results as soon as they are available. The fastest way to get your results is to activate your My Chart account. Instructions are located on the last page of this paperwork. If you have not heard from [place] regarding the results in 2 weeks, please contact this office.

## 2018-07-19 NOTE — Telephone Encounter (Signed)
1st attempt to reach pt. No answer. Lvm for pt to call back and schedule appt for clearance.

## 2018-07-19 NOTE — Telephone Encounter (Signed)
   [facility] Pre-operative Risk Assessment    Request for surgical clearance:  1. What type of surgery is being performed? Colonoscopy   2. When is this surgery scheduled? 08/03/18   3. What type of clearance is required (medical clearance vs. Pharmacy clearance to hold med vs. Both)? Both  4. Are there any medications that need to be held prior to surgery and how long? ASA  5. Practice name and name of physician performing surgery?  [facility]- Dr. Benson Norway  6. What is your office phone number [phone]    7.   What is your office fax number [phone]  8.   Anesthesia type (None, local, MAC, general) ? Propofol   Meryl Crutch 07/19/2018, 3:48 PM  _________________________________________________________________   (provider comments below)

## 2018-07-19 NOTE — Telephone Encounter (Signed)
Pt was only seen once in our office for preop eval in 07/2015. If he needs clearance beyond what his PCP can do, he will need to be seen in our office.      Primary Cardiologist: Dr Allyson Sabal  Chart reviewed as part of pre-operative protocol coverage. Because of Matthew Hutchinson's past medical history and time since last visit, he/she will require a follow-up visit in order to better assess preoperative cardiovascular risk.  Pre-op covering staff: - Please schedule appointment and call patient to inform them. - Please contact requesting surgeon's office via preferred method (i.e, phone, fax) to inform them of need for appointment prior to surgery.  If applicable, this message will also be routed to pharmacy pool and/or primary cardiologist for input on holding anticoagulant/antiplatelet agent as requested below so that this information is available at time of patient's appointment.   Matthew Bon, NP  07/19/2018, 4:13 PM

## 2018-07-20 NOTE — Telephone Encounter (Signed)
Follow up    Patient returned call I did schedule him an appointment with Dr. Allyson Sabal on 1/10 at 10:15am.

## 2018-07-21 ENCOUNTER — Encounter: Payer: Self-pay | Admitting: Cardiovascular Disease

## 2018-07-21 ENCOUNTER — Ambulatory Visit: Payer: BLUE CROSS/BLUE SHIELD | Admitting: Cardiovascular Disease

## 2018-07-21 DIAGNOSIS — E789 Disorder of lipoprotein metabolism, unspecified: Secondary | ICD-10-CM

## 2018-07-21 DIAGNOSIS — I2510 Atherosclerotic heart disease of native coronary artery without angina pectoris: Secondary | ICD-10-CM | POA: Diagnosis not present

## 2018-07-21 DIAGNOSIS — I10 Essential (primary) hypertension: Secondary | ICD-10-CM | POA: Diagnosis not present

## 2018-07-21 DIAGNOSIS — I2583 Coronary atherosclerosis due to lipid rich plaque: Secondary | ICD-10-CM

## 2018-07-21 DIAGNOSIS — G4733 Obstructive sleep apnea (adult) (pediatric): Secondary | ICD-10-CM

## 2018-07-21 NOTE — Assessment & Plan Note (Signed)
History of a normal cardiac catheterization performed by Dr. Jenne CampusMcQueen 10/28/2004.

## 2018-07-21 NOTE — Progress Notes (Signed)
07/21/2018 Sheriff Rodenberg Hagin   Feb 04, 1961  [id number]  Primary Physician Wendie Agreste, MD Primary Cardiologist: Lorretta Harp MD Lupe Carney, [place]  HPI:  Matthew Hutchinson is a 58 y.o.  severely overweight married Caucasian male with no children who owns several businesses as and was referred for cardiovascular clearance because of an elective umbilical hernia repair scheduled in the near future after his appointment..  He had this done soon thereafter uneventfully.  His risk factors include hypertension, hyperlipidemia. He has obstructive sleep apnea and wears CPAP religiously.  He's had a negative cath 10/28/2004  by Dr. Tami Ribas. He denies chest pain or shortness of breath.  Since I saw him 3 years ago he is remained stable.  He is coming up on his 10-year anniversary of a colonoscopy which needs to be repeated electively and is here for clearance.  Has been completely asymptomatic since I saw him.   Current Meds  Medication Sig  . amLODipine (NORVASC) 5 MG tablet Take 1 tablet (5 mg total) by mouth daily.  Marland Kitchen aspirin 81 MG tablet Take 1 tablet (81 mg total) by mouth daily.  Marland Kitchen b complex vitamins tablet Take 1 tablet by mouth daily.  . blood glucose meter kit and supplies Dispense based on patient and insurance preference. Use 2-3 times per day.  . calcium gluconate 500 MG tablet Take 500 mg by mouth daily.  . Cholecalciferol (VITAMIN D) 2000 UNITS tablet Take 1 tablet (2,000 Units total) by mouth daily.  Marland Kitchen gabapentin (NEURONTIN) 300 MG capsule Take 2 capsules (600 mg total) by mouth 2 (two) times daily.  . hydrOXYzine (VISTARIL) 50 MG capsule TAKE 2 BY MOUTH AT BEDTIME  . Insulin Glargine (LANTUS SOLOSTAR) 100 UNIT/ML Solostar Pen Inject 46 Units into the skin daily. Pharm req 30 day supply instead of 90.  . Insulin Pen Needle (PEN NEEDLES) 32G X 4 MM MISC 1 application by Does not apply route daily.  Marland Kitchen losartan-hydrochlorothiazide (HYZAAR) 100-25 MG tablet Take 1  tablet by mouth daily.  . Lutein 40 MG CAPS Take 1 capsule by mouth daily.  . magnesium gluconate (MAGONATE) 500 MG tablet Take 500 mg by mouth daily.  . metFORMIN (GLUCOPHAGE) 500 MG tablet Take 1 tablet (500 mg total) by mouth 2 (two) times daily with a meal.  . Multiple Vitamin (MULTIVITAMIN) capsule Take 1 capsule by mouth daily.  . nitroGLYCERIN (NITROSTAT) 0.4 MG SL tablet Place 1 tablet (0.4 mg total) under the tongue every 5 (five) minutes as needed for chest pain. If chest pain not resolved, after 3 doses 5 minutes apart--call 911  . Potassium 99 MG TABS Take 1 tablet by mouth daily.  . sertraline (ZOLOFT) 100 MG tablet TAKE 1 TABLET BY MOUTH DAILY  . Shark Cartilage 740 MG CAPS Take 1 capsule by mouth 3 (three) times daily.  . simvastatin (ZOCOR) 20 MG tablet TAKE 1 TABLET BY MOUTH AT BEDTIME,  . traMADol (ULTRAM) 50 MG tablet Take 50 mg by mouth every 6 (six) hours as needed for pain.  . traZODone (DESYREL) 100 MG tablet TAKE 1 BY MOUTH AT BEDTIME     Allergies  Allergen Reactions  . Penicillins Anaphylaxis    REACTION: anaphylaxis  . Valsartan     REACTION: itch/swelling    Social History   Socioeconomic History  . Marital status: Married    Spouse name: Not on file  . Number of children: 0  . Years of education: Not on file  .  Highest education level: Not on file  Occupational History  . Occupation: Psychologist, prison and probation services  Social Needs  . Financial resource strain: Not on file  . Food insecurity:    Worry: Not on file    Inability: Not on file  . Transportation needs:    Medical: Not on file    Non-medical: Not on file  Tobacco Use  . Smoking status: Former Smoker    Packs/day: 2.00    Years: 26.00    Pack years: 52.00    Types: Cigarettes    Last attempt to quit: 08/13/2003    Years since quitting: 14.9  . Smokeless tobacco: Never Used  . Tobacco comment: 2ppd x 26 years  Substance and Sexual Activity  . Alcohol use: No    Alcohol/week: 0.0 standard drinks     Comment: social  . Drug use: No  . Sexual activity: Yes    Partners: Male    Birth control/protection: None  Lifestyle  . Physical activity:    Days per week: Not on file    Minutes per session: Not on file  . Stress: Not on file  Relationships  . Social connections:    Talks on phone: Not on file    Gets together: Not on file    Attends religious service: Not on file    Active member of club or organization: Not on file    Attends meetings of clubs or organizations: Not on file    Relationship status: Not on file  . Intimate partner violence:    Fear of current or ex partner: Not on file    Emotionally abused: Not on file    Physically abused: Not on file    Forced sexual activity: Not on file  Other Topics Concern  . Not on file  Social History Narrative   Married. Education: college. Exercise: No.     Review of Systems: General: negative for chills, fever, night sweats or weight changes.  Cardiovascular: negative for chest pain, dyspnea on exertion, edema, orthopnea, palpitations, paroxysmal nocturnal dyspnea or shortness of breath Dermatological: negative for rash Respiratory: negative for cough or wheezing Urologic: negative for hematuria Abdominal: negative for nausea, vomiting, diarrhea, bright red blood per rectum, melena, or hematemesis Neurologic: negative for visual changes, syncope, or dizziness All other systems reviewed and are otherwise negative except as noted above.    Blood pressure 111/71, pulse 75, height 6' 2" (1.88 m), weight (!) 333 lb 9.6 oz (151.3 kg).  General appearance: alert and no distress Neck: no adenopathy, no carotid bruit, no JVD, supple, symmetrical, trachea midline and thyroid not enlarged, symmetric, no tenderness/mass/nodules Lungs: clear to auscultation bilaterally Heart: regular rate and rhythm, S1, S2 normal, no murmur, click, rub or gallop Extremities: extremities normal, atraumatic, no cyanosis or edema Pulses: 2+ and  symmetric Skin: Skin color, texture, turgor normal. No rashes or lesions Neurologic: Alert and oriented X 3, normal strength and tone. Normal symmetric reflexes. Normal coordination and gait  EKG sinus rhythm at 75 without ST or T wave changes.  There was poor R wave progression.  I personally reviewed this EKG.  ASSESSMENT AND PLAN:   Obstructive sleep apnea History of obstructive sleep apnea on CPAP which he benefits from  Essential hypertension History of essential hypertension her blood pressure measured today 111/71.  He is on amlodipine, losartan and hydrochlorothiazide.  Continue current medications.  Lipid disorder History of hyperlipidemia on statin therapy with lipid profile performed 05/09/2018 revealing total cholesterol 179, LDL of  99 and HDL of 39.  CAD (coronary artery disease) History of a normal cardiac catheterization performed by Dr. Tami Ribas 10/28/2004.      Lorretta Harp MD FACP,FACC,FAHA, [facility] 07/21/2018 10:43 AM

## 2018-07-21 NOTE — Assessment & Plan Note (Signed)
History of hyperlipidemia on statin therapy with lipid profile performed 05/09/2018 revealing total cholesterol 179, LDL of 99 and HDL of 39.

## 2018-07-21 NOTE — Patient Instructions (Signed)
Medication Instructions:  NONE If you need a refill on your cardiac medications before your next appointment, please call your pharmacy.   Lab work: NONE If you have labs (blood work) drawn today and your tests are completely normal, you will receive your results only by: Marland Kitchen MyChart Message (if you have MyChart) OR . A paper copy in the mail If you have any lab test that is abnormal or we need to change your treatment, we will call you to review the results.  Testing/Procedures: NONE  Follow-Up: At [facility], you and your health needs are our priority.  As part of our continuing mission to provide you with exceptional heart care, we have created designated Provider Care Teams.  These Care Teams include your primary Cardiologist (physician) and Advanced Practice Providers (APPs -  Physician Assistants and Nurse Practitioners) who all work together to provide you with the care you need, when you need it. . You may schedule a follow up appointment as needed. You may see Dr. Allyson Sabal or one of the following Advanced Practice Providers on your designated Care Team:   . Corine Shelter, [place] . Azalee Course, PA-C . Micah Flesher, PA-C . Joni Reining, DNP . Theodore Demark, PA-C . Judy Pimple, PA-C . Marjie Skiff, PA-C  Any Other Special Instructions Will Be Listed Below (If Applicable). YOU HAVE BEEN CLEARED AT LOW RISK FROM A CARDIAC STANDPOINT FOR YOUR UPCOMING PROCEDURE/SURGERY.

## 2018-07-21 NOTE — Assessment & Plan Note (Signed)
History of essential hypertension her blood pressure measured today 111/71.  He is on amlodipine, losartan and hydrochlorothiazide.  Continue current medications.

## 2018-07-21 NOTE — Assessment & Plan Note (Signed)
History of obstructive sleep apnea on CPAP which he benefits from 

## 2018-07-22 ENCOUNTER — Encounter: Payer: Self-pay | Admitting: Family Medicine

## 2018-07-26 ENCOUNTER — Encounter: Payer: BLUE CROSS/BLUE SHIELD | Attending: Family Medicine | Admitting: Registered"

## 2018-07-26 DIAGNOSIS — E119 Type 2 diabetes mellitus without complications: Secondary | ICD-10-CM | POA: Diagnosis not present

## 2018-07-26 NOTE — Patient Instructions (Addendum)
For Nausea - look into aromatherapy in more detail Try protein drinks for lunch Consider including protein with every dinner. If you cannot tolerate food during the day maybe talk to your doctor about changing medication management, may want to ask about meal time insulin and less basal. Continue prioritizing stress management and doing things you enjoy.

## 2018-07-26 NOTE — Progress Notes (Signed)
Diabetes Self-Management Education  Visit Type: First/Initial  Appt. Start Time: 1450 Appt. End Time: 1600  07/27/2018  Mr. Matthew Hutchinson, identified by name and date of birth, is a 58 y.o. male with a diagnosis of Diabetes: Type 2.   ASSESSMENT Pt states he is here because his doctor set this up. Pt states when his husband was diagnosed in 1998 patient attended DSME classes with him.  Pt states his FBG ranges 130-150, after meals 160-180. Pt states meter has an avg ~170. Pt reported current A1c at 5.4%, but RD suspects that is not accurate based on stated current readings and 2 months ago A1c was 14%. RD did not educate patient on A1c, appeared he was not interested in this kind of information.  Pt states he lost some weight in a short amount of time and states he did not believe his doctor when told that happens when diabetes is out of control. When RD confirmed this is likely to explain the weight loss, patient states he doesn't care how he lost the weight, that the main goal is to just lose weight. Pt describes his father's weight as yo-yo and states he takes after him. Pt provided a specific history of weight loss followed by weight gain, believing that eliminating red meat helped him lose weight and medications caused weight gain. Pt reports not eating red meat 20+ years. PT states it was hard at first to give up because [place], when started eating red meat would gain weight back.  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 Pt states sometimes his leg gives out on him and causes falls and has started using a cane. Pt states he has been told to consider a wheelchair, but doesn't want to give up and appreciates being able to walk on his 2 legs. Pt states stress and exertion that he had finding our office and walking down the long hall had him at a 14/10 pain level, but after resting for about ~45 min his level came down to  ~6/10.  Pt states he is type A personality and has a lot of stress. Pt states recently he has drastically cut back work hours and it has really helped. Pt states he has many hobbies that he enjoys and does them regularly, often daily.  Pt states he avoided addressing mental health for many years but now sees a counselor and states zoloft is working well.  Pt states he eats 1 meal per day and finds it hards to even snack mid-day, may be hungry but doesn't eat due to nausea and gets really tired after eating. Pt states if he eats too soon after waking he will throw up. Pt believes this is due to pain, but Tramadol exacerbates the nausea. Pt states he has tried ginger but dislikes it. Pt states he has scented oils in his office, but not sure if they are specific for nausea aromatherapy.  Pt reports that one of his doctors has told him to get more protein in his diet. PT states he loves salads and will add protein if it is handy and doesn't cause too much effort/pain to move around the kitchen to get the tuna or other protein to put in salad. 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 Pt states it has been 20 years since he has tried a nutritional  supplement and remembers them tasting nasty, but is willing to give it another shot.  RD provided some protein drink coupons and samples: Premier Protein, vanilla Exp: March 2020  Protein20 Lot #: [id number] CP 9252 08:37 Exp: 09/12/19  Pt states his last experience with an RD follow-up visits just ended up being trading recipes and didn't find it that useful for improving his health, not interested in follow-up visit.  Diabetes Self-Management Education - 07/26/18 1525      Visit Information   Visit Type  First/Initial      Initial Visit   Diabetes Type  Type 2    Are you currently following a meal plan?  No    Are you taking your medications as prescribed?  Yes    Date Diagnosed  2019       Health Coping   How would you rate your overall health?  Fair      Psychosocial Assessment   Patient Belief/Attitude about Diabetes  Defeat/Burnout    Learning Readiness  Contemplating    How often do you need to have someone help you when you read instructions, pamphlets, or other written materials from your doctor or pharmacy?  1 - Never    What is the last grade level you completed in school?  BA      Complications   Last HgB A1C per patient/outside source  14 %    How often do you check your blood sugar?  1-2 times/day    Fasting Blood glucose range (mg/dL)  [id number]    Postprandial Blood glucose range (mg/dL)  [phone]    Number of hypoglycemic episodes per month  0    Number of hyperglycemic episodes per week  0    Have you had a dilated eye exam in the past 12 months?  No    Have you had a dental exam in the past 12 months?  No    Are you checking your feet?  Yes    How many days per week are you checking your feet?  7      Dietary Intake   Breakfast  none    Snack (morning)  none    Lunch  none    Snack (afternoon)  none    Dinner  3x week head of iceburg lettuce, onion, sweet red pepper, cheese, sometimes tuna 1-2x/ mo OR [place]urkey burger no bun, mashed potatoes     Beverage(s)  water splash of mio, lemonade, diet soda. coffee       Exercise   Exercise Type  ADL's    How many days per week to you exercise?  0    How many minutes per day do you exercise?  0    Total minutes per week of exercise  0      Patient Education   Previous Diabetes Education  Yes (please comment)   1998   Nutrition management   Role of diet in the treatment of diabetes and the relationship between the three main macronutrients and blood glucose level    Medications  Reviewed patients medication for diabetes, action, purpose, timing of dose and side effects.      Outcomes   Expected Outcomes  Demonstrated interest in learning. Expect positive outcomes    Future DMSE  PRN    Program Status  Not  Completed      Individualized Plan for Diabetes Self-Management Training:   Learning Objective:  Patient will have a greater understanding of diabetes self-management. Patient  education plan is to attend individual and/or group sessions per assessed needs and concerns.    Patient Instructions  For Nausea - look into aromatherapy in more detail Try protein drinks for lunch Consider including protein with every dinner. If you cannot tolerate food during the day maybe talk to your doctor about changing medication management, may want to ask about meal time insulin and less basal. Continue prioritizing stress management and doing things you enjoy.  Expected Outcomes:  Demonstrated interest in learning. Expect positive outcomes  Education material provided: none  If problems or questions, patient to contact team via:  Phone  Future DSME appointment: PRN

## 2018-07-27 ENCOUNTER — Encounter: Payer: Self-pay | Admitting: Registered"

## 2018-07-27 DIAGNOSIS — E119 Type 2 diabetes mellitus without complications: Secondary | ICD-10-CM | POA: Insufficient documentation

## 2018-07-31 DIAGNOSIS — E118 Type 2 diabetes mellitus with unspecified complications: Secondary | ICD-10-CM | POA: Diagnosis not present

## 2018-07-31 DIAGNOSIS — Z794 Long term (current) use of insulin: Secondary | ICD-10-CM | POA: Diagnosis not present

## 2018-08-03 DIAGNOSIS — K5730 Diverticulosis of large intestine without perforation or abscess without bleeding: Secondary | ICD-10-CM | POA: Diagnosis not present

## 2018-08-03 DIAGNOSIS — Z1211 Encounter for screening for malignant neoplasm of colon: Secondary | ICD-10-CM | POA: Diagnosis not present

## 2018-08-03 LAB — HM COLONOSCOPY

## 2018-08-08 ENCOUNTER — Telehealth: Payer: Self-pay | Admitting: Family Medicine

## 2018-08-08 NOTE — Telephone Encounter (Signed)
Called and spoke with pt regarding their appt scheduled with Dr. Neva SeatGreene on 10/18/18. Due to Dr. Neva SeatGreene being out of the office, pt was needing to be rescheduled. I was able to get pt rescheduled to 10/24/18 at 2:20. I advised of time, building number and late policy. Pt acknowledged.

## 2018-08-10 ENCOUNTER — Telehealth: Payer: Self-pay

## 2018-08-10 NOTE — Telephone Encounter (Signed)
Noted and I have placed the forms in provider box to be completed.

## 2018-08-10 NOTE — Telephone Encounter (Signed)
Called pt and left a message asking if uses [facility] and did he request the sharp safty auto drop?

## 2018-08-10 NOTE — Telephone Encounter (Signed)
Pt states that his insurance required him to use a DME supplier and is using [facility] - he said he got an order 2 weeks ago and is using them for sharps containers & diabetic testing supplies.

## 2018-08-11 ENCOUNTER — Encounter: Payer: Self-pay | Admitting: Pediatrics

## 2018-08-11 NOTE — Progress Notes (Signed)
Colonoscopy done at [facility] by Alanson Aly  Impression: Diverticulosis in the sigmoid colon and in the descending colon. Repeat colonoscopy in 10 yrs

## 2018-08-12 NOTE — Telephone Encounter (Signed)
done

## 2018-08-16 ENCOUNTER — Ambulatory Visit (INDEPENDENT_AMBULATORY_CARE_PROVIDER_SITE_OTHER): Payer: BLUE CROSS/BLUE SHIELD | Admitting: Family Medicine

## 2018-08-16 DIAGNOSIS — E1165 Type 2 diabetes mellitus with hyperglycemia: Secondary | ICD-10-CM | POA: Diagnosis not present

## 2018-08-16 NOTE — Progress Notes (Signed)
Lab only visit 

## 2018-08-17 LAB — COMPREHENSIVE METABOLIC PANEL
ALT: 20 IU/L (ref 0–44)
AST: 27 IU/L (ref 0–40)
Albumin/Globulin Ratio: 1.7 (ref 1.2–2.2)
Albumin: 4.2 g/dL (ref 3.8–4.9)
Alkaline Phosphatase: 96 IU/L (ref 39–117)
BUN/Creatinine Ratio: 12 (ref 9–20)
BUN: 10 mg/dL (ref 6–24)
Bilirubin Total: 0.4 mg/dL (ref 0.0–1.2)
CO2: 19 mmol/L — ABNORMAL LOW (ref 20–29)
Calcium: 9.2 mg/dL (ref 8.7–10.2)
Chloride: 103 mmol/L (ref 96–106)
Creatinine, Ser: 0.84 mg/dL (ref 0.76–1.27)
GFR calc Af Amer: 112 mL/min/{1.73_m2} (ref >59)
GFR calc non Af Amer: 97 mL/min/{1.73_m2} (ref >59)
Globulin, Total: 2.5 g/dL (ref 1.5–4.5)
Glucose: 151 mg/dL — ABNORMAL HIGH (ref 65–99)
Potassium: 4 mmol/L (ref 3.5–5.2)
Sodium: 138 mmol/L (ref 134–144)
Total Protein: 6.7 g/dL (ref 6.0–8.5)

## 2018-08-17 LAB — HEMOGLOBIN A1C
Est. average glucose Bld gHb Est-mCnc: 183 mg/dL
Hgb A1c MFr Bld: 8 % — ABNORMAL HIGH (ref 4.8–5.6)

## 2018-09-24 ENCOUNTER — Encounter: Payer: Self-pay | Admitting: Family Medicine

## 2018-09-24 ENCOUNTER — Other Ambulatory Visit: Payer: Self-pay | Admitting: Family Medicine

## 2018-09-24 DIAGNOSIS — I10 Essential (primary) hypertension: Secondary | ICD-10-CM

## 2018-09-25 ENCOUNTER — Other Ambulatory Visit: Payer: Self-pay | Admitting: Family Medicine

## 2018-09-25 MED ORDER — HYDROCHLOROTHIAZIDE 25 MG PO TABS: 25.0000 mg | ORAL_TABLET | Freq: Every day | ORAL | 1 refills | Status: DC

## 2018-09-25 MED ORDER — LOSARTAN POTASSIUM 100 MG PO TABS: 100.0000 mg | ORAL_TABLET | Freq: Every day | ORAL | 1 refills | Status: DC

## 2018-09-25 NOTE — Progress Notes (Signed)
See mychart email question re: losartan/hct.  Difficulty obtaining combination pill.  It was sent to mail order pharmacy today, but will send individual components to local pharmacy in the meantime.

## 2018-10-06 DIAGNOSIS — E118 Type 2 diabetes mellitus with unspecified complications: Secondary | ICD-10-CM | POA: Diagnosis not present

## 2018-10-06 DIAGNOSIS — Z794 Long term (current) use of insulin: Secondary | ICD-10-CM | POA: Diagnosis not present

## 2018-10-17 ENCOUNTER — Other Ambulatory Visit: Payer: Self-pay

## 2018-10-17 ENCOUNTER — Ambulatory Visit (INDEPENDENT_AMBULATORY_CARE_PROVIDER_SITE_OTHER): Payer: BLUE CROSS/BLUE SHIELD | Admitting: Psychiatry

## 2018-10-17 DIAGNOSIS — F330 Major depressive disorder, recurrent, mild: Secondary | ICD-10-CM | POA: Diagnosis not present

## 2018-10-17 DIAGNOSIS — F411 Generalized anxiety disorder: Secondary | ICD-10-CM

## 2018-10-17 MED ORDER — HYDROXYZINE PAMOATE 50 MG PO CAPS: ORAL_CAPSULE | ORAL | 0 refills | Status: DC

## 2018-10-17 MED ORDER — SERTRALINE HCL 100 MG PO TABS: ORAL_TABLET | ORAL | 0 refills | Status: DC

## 2018-10-17 MED ORDER — LAMOTRIGINE 25 MG PO TABS: ORAL_TABLET | ORAL | 1 refills | Status: DC

## 2018-10-17 NOTE — Progress Notes (Signed)
Virtual Visit via Telephone Note  I connected with Matthew Hutchinson on 10/17/18 at  3:00 PM EDT by telephone and verified that I am speaking with the correct person using two identifiers.   I discussed the limitations, risks, security and privacy concerns of performing an evaluation and management service by telephone and the availability of in person appointments. I also discussed with the patient that there may be a patient responsible charge related to this service. The patient expressed understanding and agreed to proceed.   History of Present Illness: Patient was evaluated through phone session.  He appears very angry irritable due to current situation.  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  He is worried about his future.  Though he denies any suicidal thoughts or any homicidal thought but admitted getting easily irritable, frustrated and angry.  He is upset that current sedation can be preventable.  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  He admitted poor sleep, racing thought, and mood swings.  However he denies any paranoia or any hallucination.  He likes to get better but he is not sure how.  Recently he seen his physician and his hemoglobin A1c is improved but he reported lately he is not following his diet and eating carbs because he unable to get groceries on time.  Though he is trying to walk and relax himself but keep thinking about his business and financial responsibilities.  He is not drinking or using any illegal substances.  He takes trazodone only on and off.  He did not like hydroxyzine which helps his anxiety and sleep.  He is taking Zoloft.  He denies any tremors shakes or any EPS.    Past Psychiatric History: Reviewed. H/[facility] at behavioral [facility] for overdose on Ambien.  History of taking overdose on Ambien 2 other times but did not require inpatient treatment.  History of  mood swing, impulsive behavior, speeding ticket, anger, road rage.  No history of psychosis or hallucination.  Recent Results (from the past 2160 hour(s))  HM COLONOSCOPY     Status: None   Collection Time: 08/03/18 12:00 AM  Result Value Ref Range   HM Colonoscopy See Report (in chart) See Report (in chart), Patient Reported  Comprehensive metabolic panel     Status: Abnormal   Collection Time: 08/16/18 10:54 AM  Result Value Ref Range   Glucose 151 (H) 65 - 99 mg/dL   BUN 10 6 - 24 mg/dL   Creatinine, Ser 1.24 0.76 - 1.27 mg/dL   GFR calc non Af Amer 97 >59 mL/min/1.73   GFR calc Af Amer 112 >59 mL/min/1.73   BUN/Creatinine Ratio 12 9 - 20   Sodium 138 134 - 144 mmol/L   Potassium 4.0 3.5 - 5.2 mmol/L   Chloride 103 96 - 106 mmol/L   CO2 19 (L) 20 - 29 mmol/L   Calcium 9.2 8.7 - 10.2 mg/dL   Total Protein 6.7 6.0 - 8.5 g/dL   Albumin 4.2 3.8 - 4.9 g/dL    Comment:               **Please note reference interval change**   Globulin, Total 2.5 1.5 - 4.5 g/dL   Albumin/Globulin Ratio 1.7 1.2 - 2.2   Bilirubin Total 0.4 0.0 - 1.2 mg/dL   Alkaline Phosphatase 96 39 - 117 IU/L   AST 27 0 - 40 IU/L  ALT 20 0 - 44 IU/L  Hemoglobin A1c     Status: Abnormal   Collection Time: 08/16/18 10:54 AM  Result Value Ref Range   Hgb A1c MFr Bld 8.0 (H) 4.8 - 5.6 %    Comment:          Prediabetes: 5.7 - 6.4          Diabetes: >6.4          Glycemic control for adults with diabetes: <7.0    Est. average glucose Bld gHb Est-mCnc 183 mg/dL     Observations/Objective: Limited mental status examination done on the phone.  Patient appears irritable and frustrated on the current situation.  His speech is fast and his tone is increased.  He denies any auditory or visual hallucination.  Denies any active or passive suicidal thoughts or homicidal thought.  His attention and concentration is okay.  He does not appear to be distracted on the phone.  There were no delusions.  He is alert and oriented x3.   His thought process goal-directed.  He does not have flight of ideas.  His cognition is intact.  His fund of knowledge is adequate.  His insight judgment is fair.  Assessment and Plan: Major depressive disorder, recurrent.  Generalized anxiety disorder.  Discussed current situation and reassurance given.  He admitted it is a situational anxiety and is trying to calm himself but certain things are not in his control.  I encourage to try Lamictal to help his mood irritability and anger.  After some discussion he agreed with the plan.  He is not interested in therapy.  I will continue hydroxyzine 100 mg at bedtime, Zoloft 100 mg daily and we will start Lamictal 25 mg daily for 1 week and then 50 mg daily.  He has trazodone which he takes only if he cannot sleep.  I discussed medication side effects and benefits especially if he has a rash with the Lamictal then he need to stop the medication immediately.  Is also taking gabapentin for neuropathy.  I reviewed his blood work results.  Encourage to walk regularly and do exercise and try to watch his calorie intake.  Discussed safety concern that anytime having active suicidal thoughts or homicidal thought that he need to call 911 or go to local emergency room.  Follow-up in 4 to 6 weeks.  Follow Up Instructions:    I discussed the assessment and treatment plan with the patient. The patient was provided an opportunity to ask questions and all were answered. The patient agreed with the plan and demonstrated an understanding of the instructions.   The patient was advised to call back or seek an in-person evaluation if the symptoms worsen or if the condition fails to improve as anticipated.  I provided 30 minutes of non-face-to-face time during this encounter.   Cleotis NipperSyed T Dola Lunsford, MD

## 2018-10-18 ENCOUNTER — Ambulatory Visit: Payer: Self-pay | Admitting: Family Medicine

## 2018-10-24 ENCOUNTER — Telehealth (INDEPENDENT_AMBULATORY_CARE_PROVIDER_SITE_OTHER): Payer: BLUE CROSS/BLUE SHIELD | Admitting: Family Medicine

## 2018-10-24 ENCOUNTER — Encounter: Payer: Self-pay | Admitting: Family Medicine

## 2018-10-24 ENCOUNTER — Other Ambulatory Visit: Payer: Self-pay

## 2018-10-24 DIAGNOSIS — R51 Headache: Secondary | ICD-10-CM

## 2018-10-24 DIAGNOSIS — E119 Type 2 diabetes mellitus without complications: Secondary | ICD-10-CM | POA: Diagnosis not present

## 2018-10-24 DIAGNOSIS — I10 Essential (primary) hypertension: Secondary | ICD-10-CM

## 2018-10-24 DIAGNOSIS — E1165 Type 2 diabetes mellitus with hyperglycemia: Secondary | ICD-10-CM | POA: Diagnosis not present

## 2018-10-24 DIAGNOSIS — E785 Hyperlipidemia, unspecified: Secondary | ICD-10-CM

## 2018-10-24 DIAGNOSIS — R519 Headache, unspecified: Secondary | ICD-10-CM

## 2018-10-24 MED ORDER — METFORMIN HCL 500 MG PO TABS: 500.0000 mg | ORAL_TABLET | Freq: Two times a day (BID) | ORAL | 1 refills | Status: DC

## 2018-10-24 MED ORDER — INSULIN GLARGINE 100 UNIT/ML SOLOSTAR PEN: 50.0000 [IU] | PEN_INJECTOR | Freq: Every day | SUBCUTANEOUS | 5 refills | Status: DC

## 2018-10-24 NOTE — Patient Instructions (Addendum)
Headache may be due to tension/stress. Can try continued relaxation/stress techniques. advil if helpful short term, but if headache is not improving in next few days or any worsening - be seen in emergency room.   Continue Lantus at 50 units for now, continue to try to monitor diet but to do the best you can during these times.  Lab only visit in the next 6 weeks, then office visit in the next 2 to 3 months.  Return to the clinic or go to the nearest emergency room if any of your symptoms worsen or new symptoms occur.    Tension Headache, Adult A tension headache is a feeling of pain, pressure, or aching in the head that is often felt over the front and sides of the head. The pain can be dull, or it can feel tight (constricting). There are two types of tension headache:  Episodic tension headache. This is when the headaches happen fewer than 15 days a month.  Chronic tension headache. This is when the headaches happen more than 15 days a month during a 5427-month period. A tension headache can last from 30 minutes to several days. It is the most common kind of headache. Tension headaches are not normally associated with nausea or vomiting, and they do not get worse with physical activity. What are the causes? The exact cause of this condition is not known. Tension headaches are often triggered by stress, anxiety, or depression. Other triggers include:  Alcohol.  Too much caffeine or caffeine withdrawal.  Respiratory infections, such as colds, flu, or sinus infections.  Dental problems or teeth clenching.  Tiredness (fatigue).  Holding your head and neck in the same position for a long period of time, such as while using a computer.  Smoking.  Arthritis of the neck. What are the signs or symptoms? Symptoms of this condition include:  A feeling of pressure or tightness around the head.  Dull, aching head pain.  Pain over the front and sides of the head.  Tenderness in the  muscles of the head, neck, and shoulders. How is this diagnosed? This condition may be diagnosed based on your symptoms, your medical history, and a physical exam. If your symptoms are severe or unusual, you may have imaging tests, such as a CT scan or an MRI of your head. Your vision may also be checked. How is this treated? This condition may be treated with lifestyle changes and with medicines that help relieve symptoms. Follow these instructions at home: Managing pain  Take over-the-counter and prescription medicines only as told by your health care provider.  When you have a headache, lie down in a dark, quiet room.  If directed, apply ice to the head and neck: ? Put ice in a plastic bag. ? Place a towel between your skin and the bag. ? Leave the ice on for 20 minutes, 2-3 times a day.  If directed, apply heat to the back of your neck as often as told by your health care provider. Use the heat source that your health care provider recommends, such as a moist heat pack or a heating pad. ? Place a towel between your skin and the heat source. ? Leave the heat on for 20-30 minutes. ? Remove the heat if your skin turns bright red. This is especially important if you are unable to feel pain, heat, or cold. You may have a greater risk of getting burned. Eating and drinking  Eat meals on a regular schedule.  Limit alcohol intake to no more than 1 drink a day for nonpregnant women and 2 drinks a day for men. One drink equals 12 oz of beer, 5 oz of wine, or 1 oz of hard liquor.  Drink enough fluid to keep your urine pale yellow.  Decrease your caffeine intake, or stop using caffeine. Lifestyle  Get 7-9 hours of sleep each night, or get the amount of sleep recommended by your health care provider.  At bedtime, remove all electronic devices from your room. Electronic devices include computers, phones, and tablets.  Find ways to manage your stress. Some things that can help relieve  stress include: ? Exercise. ? Deep breathing exercises. ? Yoga. ? Listening to music. ? Positive mental imagery.  Try to sit up straight and avoid tensing your muscles.  Do not use any products that contain nicotine or tobacco, such as cigarettes and e-cigarettes. If you need help quitting, ask your health care provider. General instructions   Keep all follow-up visits as told by your health care provider. This is important.  Avoid any headache triggers. Keep a headache journal to help find out what may trigger your headaches. For example, write down: ? What you eat and drink. ? How much sleep you get. ? Any change to your diet or medicines. Contact a health care provider if:  Your headache does not get better.  Your headache comes back.  You are sensitive to sounds, light, or smells because of a headache.  You have nausea or you vomit.  Your stomach hurts. Get help right away if:  You suddenly develop a very severe headache along with any of the following: ? A stiff neck. ? Nausea and vomiting. ? Confusion. ? Weakness. ? Double vision or loss of vision. ? Shortness of breath. ? Rash. ? Unusual sleepiness. ? Fever. ? Trouble speaking. ? Pain in your eyes or ears. ? Trouble walking or balancing. ? Feeling faint or passing out. Summary  A tension headache is a feeling of pain, pressure, or aching in the head that is often felt over the front and sides of the head.  A tension headache can last from 30 minutes to several days. It is the most common kind of headache.  This condition may be diagnosed based on your symptoms, your medical history, and a physical exam.  This condition may be treated with lifestyle changes and with medicines that help relieve symptoms. This information is not intended to replace advice given to you by your health care provider. Make sure you discuss any questions you have with your health care provider. Document Released: 06/28/2005  Document Revised: 10/08/2016 Document Reviewed: 10/08/2016 Elsevier Interactive Patient Education  2019 [employer]  General Headache Without Cause A headache is pain or discomfort that is felt around the head or neck area. There are many causes and types of headaches. In some cases, the cause may not be found. Follow these instructions at home: Watch your condition for any changes. Let your doctor know about them. Take these steps to help with your condition: Managing pain      Take over-the-counter and prescription medicines only as told by your doctor.  Lie down in a dark, quiet room when you have a headache.  If told, put ice on your head and neck area: ? Put ice in a plastic bag. ? Place a towel between your skin and the bag. ? Leave the ice on for 20 minutes, 2-3 times per day.  If told,  put heat on the affected area. Use the heat source that your doctor recommends, such as a moist heat pack or a heating pad. ? Place a towel between your skin and the heat source. ? Leave the heat on for 20-30 minutes. ? Remove the heat if your skin turns bright red. This is very important if you are unable to feel pain, heat, or cold. You may have a greater risk of getting burned.  Keep lights dim if bright lights bother you or make your headaches worse. Eating and drinking  Eat meals on a regular schedule.  If you drink alcohol: ? Limit how much you use to:  0-1 drink a day for women.  0-2 drinks a day for men. ? Be aware of how much alcohol is in your drink. In the [place] one drink equals one 12 oz bottle of beer (355 mL), one 5 oz glass of wine (148 mL), or one 1 oz glass of hard liquor (44 mL).  Stop drinking caffeine, or reduce how much caffeine you drink. General instructions   Keep a journal to find out if certain things bring on headaches. For example, write down: ? What you eat and drink. ? How much sleep you get. ? Any change to your diet or medicines.  Get a massage  or try other ways to relax.  Limit stress.  Sit up straight. Do not tighten (tense) your muscles.  Do not use any products that contain nicotine or tobacco. This includes cigarettes, e-cigarettes, and chewing tobacco. If you need help quitting, ask your doctor.  Exercise regularly as told by your doctor.  Get enough sleep. This often means 7-9 hours of sleep each night.  Keep all follow-up visits as told by your doctor. This is important. Contact a doctor if:  Your symptoms are not helped by medicine.  You have a headache that feels different than the other headaches.  You feel sick to your stomach (nauseous) or you throw up (vomit).  You have a fever. Get help right away if:  Your headache gets very bad quickly.  Your headache gets worse after a lot of physical activity.  You keep throwing up.  You have a stiff neck.  You have trouble seeing.  You have trouble speaking.  You have pain in the eye or ear.  Your muscles are weak or you lose muscle control.  You lose your balance or have trouble walking.  You feel like you will pass out (faint) or you pass out.  You are mixed up (confused).  You have a seizure. Summary  A headache is pain or discomfort that is felt around the head or neck area.  There are many causes and types of headaches. In some cases, the cause may not be found.  Keep a journal to help find out what causes your headaches. Watch your condition for any changes. Let your doctor know about them.  Contact a doctor if you have a headache that is different from usual, or if your headache is not helped by medicine.  Get help right away if your headache gets very bad, you throw up, you have trouble seeing, you lose your balance, or you have a seizure. This information is not intended to replace advice given to you by your health care provider. Make sure you discuss any questions you have with your health care provider. Document Released: 04/06/2008  Document Revised: 01/16/2018 Document Reviewed: 01/16/2018 Elsevier Interactive Patient Education  2019 [employer].  If you have lab work done today you will be contacted with your lab results within the next 2 weeks.  If you have not heard from us then please contact us. The fastest way to get your results is to register for My Chart. ° ° °IF you received an x-ray today, you will receive an invoice from [facility]. Please contact [facility] at [phone] with questions or concerns regarding your invoice.  ° °IF you received labwork today, you will receive an invoice from [facility]. Please contact [facility] at [phone] with questions or concerns regarding your invoice.  ° °Our billing staff will not be able to assist you with questions regarding bills from these companies. ° °You will be contacted with the lab results as soon as they are available. The fastest way to get your results is to activate your My Chart account. Instructions are located on the last page of this paperwork. If you have not heard from us regarding the results in 2 weeks, please contact this office. °  ° ° ° °

## 2018-10-24 NOTE — Telephone Encounter (Signed)
Discussed at office visit today. 

## 2018-10-24 NOTE — Progress Notes (Signed)
CC- 3 mo f/u diabetes- Not sure if labs are needed at this time. Last lab was done 08/16/2018. Patient need a refill on his metformin medication. Today blood sugar was 138

## 2018-10-24 NOTE — Progress Notes (Signed)
Virtual Visit via Telephone Note  I connected with Matthew Hutchinson on 10/24/18 at approx 440pm by telephone and verified that I am speaking with the correct person using two identifiers.   I discussed the limitations, risks, security and privacy concerns of performing an evaluation and management service by telephone and the availability of in person appointments. I also discussed with the patient that there may be a patient responsible charge related to this service. The patient expressed understanding and agreed to proceed, consent obtained  Chief complaint: Diabetes  History of Present Illness:    Diabetes:  Increased A1c at 14 in October last year, started on insulin after December visit.  Had made some dietary adjustments, also evaluated by nutrition and diabetes management center in January.  Was tolerating Lantus at 46 units/day at January office visit, with significantly improved A1c at 8.0 in February. Now on 50 units past few weeks.  Still on metformin '500mg'$  BID.  Up and down blood sugars based on diet. Difficult dietary choices due to recent pandemic, and availability in local store.  7, 14, and 30 day averages. 164, 198, 200.  More conscious of eating. Working from home.  Less walking recently, but some yardwork recently.  Microalbumin: 35 in April 2019 Optho, foot exam, pneumovax: due for optho, utd o/w.  On zocor '20mg'$  QD. No new side effects.  On ARB - losartan/hct.  Home BP around 140/70.    Saw Dr. Adele Schilder last week - will be on lamictal soon. Thinks some of HA is due to stress/anxiety.  Headache past 5 days - feels like tension/stress starts at shoulder blades to R side of head.  Able to move neck. Some photophobia, but has had longstanding light sensitivity. No known hx of migraine.  Not worst HA of life.  No facial droop. No slurred speech, or focal weakness.  No chest pain.  No alcohol.  Tx: advil 2 per day. Has not tried tylenol, warm and cold  soaks. Min change. Warm compress. Feels like stress HA.  No fever.    Lab Results  Component Value Date   HGBA1C 8.0 (H) 08/16/2018   HGBA1C 14.0 (A) 05/09/2018   HGBA1C 11.3 (H) 11/04/2017   Lab Results  Component Value Date   MICROALBUR 0.6 06/10/2016   [place] 99 05/09/2018   CREATININE 0.84 08/16/2018    Patient Active Problem List   Diagnosis Date Noted  . Newly diagnosed diabetes ([place]) 07/27/2018  . Morbid obesity ([place]) 02/02/2017  . Chronic pain due to trauma 06/25/2013  . Depressive disorder, not elsewhere classified 11/09/2012  . CAD (coronary artery disease) 02/21/2012  . Lipid disorder 02/21/2012  . Angina pectoris 09/27/2011  . Obstructive sleep apnea 05/20/2010  . Essential hypertension 04/17/2010  . EMPHYSEMA 04/17/2010  . WEIGHT GAIN, ABNORMAL 04/17/2010   Past Medical History:  Diagnosis Date  . Allergy   . Anxiety   . Arthritis   . COPD (chronic obstructive pulmonary disease) ([place])   . Depression   . Emphysema of lung ([place])   . Hyperlipidemia   . Morbid obesity ([place])   . OSA (obstructive sleep apnea)    uses CPAP nightly  . PONV (postoperative nausea and vomiting)   . Unspecified essential hypertension    Past Surgical History:  Procedure Laterality Date  . INSERTION OF MESH N/A 09/18/2015   Procedure: INSERTION OF MESH;  Surgeon: Coralie Keens, MD;  Location: [facility];  Service: General;  Laterality: N/A;  . LEG SURGERY  Left   . SHOULDER ARTHROSCOPY Left   . TONSILLECTOMY    . UMBILICAL HERNIA REPAIR N/A 09/18/2015   Procedure: HERNIA REPAIR UMBILICAL ADULT WITH MESH ;  Surgeon: Coralie Keens, MD;  Location: [facility];  Service: General;  Laterality: N/A;   Allergies  Allergen Reactions  . Penicillins Anaphylaxis    REACTION: anaphylaxis  . Valsartan     REACTION: itch/swelling   Prior to Admission medications   Medication Sig Start Date End Date Taking? Authorizing Provider  amLODipine (NORVASC) 5  MG tablet Take 1 tablet (5 mg total) by mouth daily. 05/09/18  Yes Wendie Agreste, MD  aspirin 81 MG tablet Take 1 tablet (81 mg total) by mouth daily. 11/13/12  Yes Patrecia Pour, NP  b complex vitamins tablet Take 1 tablet by mouth daily.   Yes [provider]  calcium gluconate 500 MG tablet Take 500 mg by mouth daily.   Yes [provider]  Cholecalciferol (VITAMIN D) 2000 UNITS tablet Take 1 tablet (2,000 Units total) by mouth daily. 11/13/12  Yes Patrecia Pour, NP  gabapentin (NEURONTIN) 300 MG capsule Take 2 capsules (600 mg total) by mouth 2 (two) times daily. 11/04/17  Yes Wendie Agreste, MD  hydrOXYzine (VISTARIL) 50 MG capsule TAKE 2 BY MOUTH AT BEDTIME 10/17/18  Yes Arfeen, Arlyce Harman, MD  Insulin Glargine (LANTUS SOLOSTAR) 100 UNIT/ML Solostar Pen Inject 46 Units into the skin daily. Pharm req 30 day supply instead of 90. 07/19/18  Yes Wendie Agreste, MD  Insulin Pen Needle (PEN NEEDLES) 32G X 4 MM MISC 1 application by Does not apply route daily. 05/09/18  Yes Wendie Agreste, MD  lamoTRIgine (LAMICTAL) 25 MG tablet Take one tab daily for 1 wek and than 2 tab daiy 10/17/18  Yes Arfeen, Arlyce Harman, MD  losartan-hydrochlorothiazide (HYZAAR) 100-25 MG tablet Take 1 tablet by mouth daily. 09/25/18  Yes Wendie Agreste, MD  Lutein 40 MG CAPS Take 1 capsule by mouth daily.   Yes [provider]  magnesium gluconate (MAGONATE) 500 MG tablet Take 500 mg by mouth daily.   Yes [provider]  metFORMIN (GLUCOPHAGE) 500 MG tablet Take 1 tablet (500 mg total) by mouth 2 (two) times daily with a meal. 05/09/18  Yes Wendie Agreste, MD  Multiple Vitamin (MULTIVITAMIN) capsule Take 1 capsule by mouth daily. 11/13/12  Yes Lord, Asa Saunas, NP  nitroGLYCERIN (NITROSTAT) 0.4 MG SL tablet Place 1 tablet (0.4 mg total) under the tongue every 5 (five) minutes as needed for chest pain. If chest pain not resolved, after 3 doses 5 minutes apart--call 911 11/13/12  Yes Lord,  Asa Saunas, NP  PLENVU 140 g SOLR See admin instructions. 07/21/18  Yes [provider]  Potassium 99 MG TABS Take 1 tablet by mouth daily.   Yes [provider]  sertraline (ZOLOFT) 100 MG tablet TAKE 1 TABLET BY MOUTH DAILY 10/17/18  Yes Arfeen, Arlyce Harman, MD  Shark Cartilage 740 MG CAPS Take 1 capsule by mouth 3 (three) times daily.   Yes [provider]  simvastatin (ZOCOR) 20 MG tablet TAKE 1 TABLET BY MOUTH AT BEDTIME, 05/09/18  Yes Wendie Agreste, MD  traMADol (ULTRAM) 50 MG tablet Take 50 mg by mouth every 6 (six) hours as needed for pain.   Yes [provider]  traZODone (DESYREL) 100 MG tablet TAKE 1 BY MOUTH AT BEDTIME 07/18/18  Yes Arfeen, Arlyce Harman, MD  azelastine (ASTELIN)  0.1 % nasal spray  06/29/18   [provider]  blood glucose meter kit and supplies Dispense based on patient and insurance preference. Use 2-3 times per day. 05/09/18   Wendie Agreste, MD   Social History   Socioeconomic History  . Marital status: Married    Spouse name: Not on file  . Number of children: 0  . Years of education: Not on file  . Highest education level: Not on file  Occupational History  . Occupation: Psychologist, prison and probation services  Social Needs  . Financial resource strain: Not on file  . Food insecurity:    Worry: Not on file    Inability: Not on file  . Transportation needs:    Medical: Not on file    Non-medical: Not on file  Tobacco Use  . Smoking status: Former Smoker    Packs/day: 2.00    Years: 26.00    Pack years: 52.00    Types: Cigarettes    Last attempt to quit: 08/13/2003    Years since quitting: 15.2  . Smokeless tobacco: Never Used  . Tobacco comment: 2ppd x 26 years  Substance and Sexual Activity  . Alcohol use: No    Alcohol/week: 0.0 standard drinks    Comment: social  . Drug use: No  . Sexual activity: Yes    Partners: Male    Birth control/protection: None  Lifestyle  . Physical activity:    Days per week: Not on file     Minutes per session: Not on file  . Stress: Not on file  Relationships  . Social connections:    Talks on phone: Not on file    Gets together: Not on file    Attends religious service: Not on file    Active member of club or organization: Not on file    Attends meetings of clubs or organizations: Not on file    Relationship status: Not on file  . Intimate partner violence:    Fear of current or ex partner: Not on file    Emotionally abused: Not on file    Physically abused: Not on file    Forced sexual activity: Not on file  Other Topics Concern  . Not on file  Social History Narrative   Married. Education: college. Exercise: No.     Observations/Objective:   Assessment and Plan: Hyperlipidemia, unspecified hyperlipidemia type - Plan: Lipid panel, Comprehensive metabolic panel  -No med changes for now, lab only visit within 6 weeks.  Office visit 3 months  Type 2 diabetes mellitus without complication, without long-term current use of insulin ([place]) - Plan: metFORMIN (GLUCOPHAGE) 500 MG tablet Type 2 diabetes mellitus with hyperglycemia, without long-term current use of insulin ([facility]) - Plan: Insulin Glargine (LANTUS SOLOSTAR) 100 UNIT/ML Solostar Pen, Hemoglobin A1c  -Some decreased control thought to be due to diet changes, availability of healthier foods given current pandemic.  Tolerating 50 units of Lantus at this time, will continue same dosing with metformin same dose as well.  Monitor diet as best as possible.  Plan for labs in 6 weeks.  Essential hypertension - Plan: Comprehensive metabolic panel  -Borderline but overall controlled at home on current regimen.  No changes for now  Acute nonintractable headache, unspecified headache type  -Denies this being worst headache of life, denies focal weakness or other red flag symptoms as above.  Thought to be tension/stress headache, possible migrainous headache.    -Has been in contact with psychiatrist with new medication plan  for worsening anxiety symptoms.    -Handout given on headache including tension or general headache, Advil short-term if needed, stress management techniques and other symptomatic treatment discussed  - ER precautions were given if any worsening headache or new associated symptoms.  Understanding expressed  Follow Up Instructions:   Patient Instructions   Headache may be due to tension/stress. Can try continued relaxation/stress techniques. advil if helpful short term, but if headache is not improving in next few days or any worsening - be seen in emergency room.   Continue Lantus at 50 units for now, continue to try to monitor diet but to do the best you can during these times.  Lab only visit in the next 6 weeks, then office visit in the next 2 to 3 months.  Return to the clinic or go to the nearest emergency room if any of your symptoms worsen or new symptoms occur.    Tension Headache, Adult A tension headache is a feeling of pain, pressure, or aching in the head that is often felt over the front and sides of the head. The pain can be dull, or it can feel tight (constricting). There are two types of tension headache:  Episodic tension headache. This is when the headaches happen fewer than 15 days a month.  Chronic tension headache. This is when the headaches happen more than 15 days a month during a 32-monthperiod. A tension headache can last from 30 minutes to several days. It is the most common kind of headache. Tension headaches are not normally associated with nausea or vomiting, and they do not get worse with physical activity. What are the causes? The exact cause of this condition is not known. Tension headaches are often triggered by stress, anxiety, or depression. Other triggers include:  Alcohol.  Too much caffeine or caffeine withdrawal.  Respiratory infections, such as colds, flu, or sinus infections.  Dental problems or teeth clenching.  Tiredness (fatigue).   Holding your head and neck in the same position for a long period of time, such as while using a computer.  Smoking.  Arthritis of the neck. What are the signs or symptoms? Symptoms of this condition include:  A feeling of pressure or tightness around the head.  Dull, aching head pain.  Pain over the front and sides of the head.  Tenderness in the muscles of the head, neck, and shoulders. How is this diagnosed? This condition may be diagnosed based on your symptoms, your medical history, and a physical exam. If your symptoms are severe or unusual, you may have imaging tests, such as a CT scan or an MRI of your head. Your vision may also be checked. How is this treated? This condition may be treated with lifestyle changes and with medicines that help relieve symptoms. Follow these instructions at home: Managing pain  Take over-the-counter and prescription medicines only as told by your health care provider.  When you have a headache, lie down in a dark, quiet room.  If directed, apply ice to the head and neck: ? Put ice in a plastic bag. ? Place a towel between your skin and the bag. ? Leave the ice on for 20 minutes, 2-3 times a day.  If directed, apply heat to the back of your neck as often as told by your health care provider. Use the heat source that your health care provider recommends, such as a moist heat pack or a heating pad. ? Place a towel between your skin and  the heat source. ? Leave the heat on for 20-30 minutes. ? Remove the heat if your skin turns bright red. This is especially important if you are unable to feel pain, heat, or cold. You may have a greater risk of getting burned. Eating and drinking  Eat meals on a regular schedule.  Limit alcohol intake to no more than 1 drink a day for nonpregnant women and 2 drinks a day for men. One drink equals 12 oz of beer, 5 oz of wine, or 1 oz of hard liquor.  Drink enough fluid to keep your urine pale yellow.   Decrease your caffeine intake, or stop using caffeine. Lifestyle  Get 7-9 hours of sleep each night, or get the amount of sleep recommended by your health care provider.  At bedtime, remove all electronic devices from your room. Electronic devices include computers, phones, and tablets.  Find ways to manage your stress. Some things that can help relieve stress include: ? Exercise. ? Deep breathing exercises. ? Yoga. ? Listening to music. ? Positive mental imagery.  Try to sit up straight and avoid tensing your muscles.  Do not use any products that contain nicotine or tobacco, such as cigarettes and e-cigarettes. If you need help quitting, ask your health care provider. General instructions   Keep all follow-up visits as told by your health care provider. This is important.  Avoid any headache triggers. Keep a headache journal to help find out what may trigger your headaches. For example, write down: ? What you eat and drink. ? How much sleep you get. ? Any change to your diet or medicines. Contact a health care provider if:  Your headache does not get better.  Your headache comes back.  You are sensitive to sounds, light, or smells because of a headache.  You have nausea or you vomit.  Your stomach hurts. Get help right away if:  You suddenly develop a very severe headache along with any of the following: ? A stiff neck. ? Nausea and vomiting. ? Confusion. ? Weakness. ? Double vision or loss of vision. ? Shortness of breath. ? Rash. ? Unusual sleepiness. ? Fever. ? Trouble speaking. ? Pain in your eyes or ears. ? Trouble walking or balancing. ? Feeling faint or passing out. Summary  A tension headache is a feeling of pain, pressure, or aching in the head that is often felt over the front and sides of the head.  A tension headache can last from 30 minutes to several days. It is the most common kind of headache.  This condition may be diagnosed based on your  symptoms, your medical history, and a physical exam.  This condition may be treated with lifestyle changes and with medicines that help relieve symptoms. This information is not intended to replace advice given to you by your health care provider. Make sure you discuss any questions you have with your health care provider. Document Released: 06/28/2005 Document Revised: 10/08/2016 Document Reviewed: 10/08/2016 Elsevier Interactive Patient Education  2019 [place] Headache Without Cause A headache is pain or discomfort that is felt around the head or neck area. There are many causes and types of headaches. In some cases, the cause may not be found. Follow these instructions at home: Watch your condition for any changes. Let your doctor know about them. Take these steps to help with your condition: Managing pain      Take over-the-counter and prescription medicines only as told by your doctor.  Lie down in a dark, quiet room when you have a headache.  If told, put ice on your head and neck area: ? Put ice in a plastic bag. ? Place a towel between your skin and the bag. ? Leave the ice on for 20 minutes, 2-3 times per day.  If told, put heat on the affected area. Use the heat source that your doctor recommends, such as a moist heat pack or a heating pad. ? Place a towel between your skin and the heat source. ? Leave the heat on for 20-30 minutes. ? Remove the heat if your skin turns bright red. This is very important if you are unable to feel pain, heat, or cold. You may have a greater risk of getting burned.  Keep lights dim if bright lights bother you or make your headaches worse. Eating and drinking  Eat meals on a regular schedule.  If you drink alcohol: ? Limit how much you use to:  0-1 drink a day for women.  0-2 drinks a day for men. ? Be aware of how much alcohol is in your drink. In the [place] one drink equals one 12 oz bottle of beer (355 mL), one 5 oz  glass of wine (148 mL), or one 1 oz glass of hard liquor (44 mL).  Stop drinking caffeine, or reduce how much caffeine you drink. General instructions   Keep a journal to find out if certain things bring on headaches. For example, write down: ? What you eat and drink. ? How much sleep you get. ? Any change to your diet or medicines.  Get a massage or try other ways to relax.  Limit stress.  Sit up straight. Do not tighten (tense) your muscles.  Do not use any products that contain nicotine or tobacco. This includes cigarettes, e-cigarettes, and chewing tobacco. If you need help quitting, ask your doctor.  Exercise regularly as told by your doctor.  Get enough sleep. This often means 7-9 hours of sleep each night.  Keep all follow-up visits as told by your doctor. This is important. Contact a doctor if:  Your symptoms are not helped by medicine.  You have a headache that feels different than the other headaches.  You feel sick to your stomach (nauseous) or you throw up (vomit).  You have a fever. Get help right away if:  Your headache gets very bad quickly.  Your headache gets worse after a lot of physical activity.  You keep throwing up.  You have a stiff neck.  You have trouble seeing.  You have trouble speaking.  You have pain in the eye or ear.  Your muscles are weak or you lose muscle control.  You lose your balance or have trouble walking.  You feel like you will pass out (faint) or you pass out.  You are mixed up (confused).  You have a seizure. Summary  A headache is pain or discomfort that is felt around the head or neck area.  There are many causes and types of headaches. In some cases, the cause may not be found.  Keep a journal to help find out what causes your headaches. Watch your condition for any changes. Let your doctor know about them.  Contact a doctor if you have a headache that is different from usual, or if your headache is not  helped by medicine.  Get help right away if your headache gets very bad, you throw up, you have trouble seeing, you lose  your balance, or you have a seizure. This information is not intended to replace advice given to you by your health care provider. Make sure you discuss any questions you have with your health care provider. Document Released: 04/06/2008 Document Revised: 01/16/2018 Document Reviewed: 01/16/2018 Elsevier Interactive Patient Education  [employer].    If you have lab work done today you will be contacted with your lab results within the next 2 weeks.  If you have not heard from [place] then please contact us. The fastest way to get your results is to register for My Chart.   IF you received an x-ray today, you will receive an invoice from [facility]. Please contact [facility] at [phone] with questions or concerns regarding your invoice.   IF you received labwork today, you will receive an invoice from [facility]. Please contact [facility] at [phone] with questions or concerns regarding your invoice.   Our billing staff will not be able to assist you with questions regarding bills from these companies.  You will be contacted with the lab results as soon as they are available. The fastest way to get your results is to activate your My Chart account. Instructions are located on the last page of this paperwork. If you have not heard from [place] regarding the results in 2 weeks, please contact this office.          I discussed the assessment and treatment plan with the patient. The patient was provided an opportunity to ask questions and all were answered. The patient agreed with the plan and demonstrated an understanding of the instructions.   The patient was advised to call back or seek an in-person evaluation if the symptoms worsen or if the condition fails to improve as anticipated.  I provided 13 minutes of non-face-to-face time during this  encounter.  Signed,   Merri Ray, MD [facility].  10/24/18

## 2018-11-16 ENCOUNTER — Other Ambulatory Visit: Payer: Self-pay

## 2018-11-16 ENCOUNTER — Ambulatory Visit (INDEPENDENT_AMBULATORY_CARE_PROVIDER_SITE_OTHER): Payer: BLUE CROSS/BLUE SHIELD | Admitting: Psychiatry

## 2018-11-16 ENCOUNTER — Encounter (HOSPITAL_COMMUNITY): Payer: Self-pay | Admitting: Psychiatry

## 2018-11-16 DIAGNOSIS — F411 Generalized anxiety disorder: Secondary | ICD-10-CM

## 2018-11-16 DIAGNOSIS — F330 Major depressive disorder, recurrent, mild: Secondary | ICD-10-CM | POA: Diagnosis not present

## 2018-11-16 MED ORDER — TRAZODONE HCL 100 MG PO TABS: ORAL_TABLET | ORAL | 0 refills | Status: DC

## 2018-11-16 MED ORDER — SERTRALINE HCL 100 MG PO TABS: ORAL_TABLET | ORAL | 0 refills | Status: DC

## 2018-11-16 MED ORDER — LAMOTRIGINE 100 MG PO TABS: 100.0000 mg | ORAL_TABLET | Freq: Every day | ORAL | 0 refills | Status: DC

## 2018-11-16 MED ORDER — HYDROXYZINE PAMOATE 50 MG PO CAPS: ORAL_CAPSULE | ORAL | 0 refills | Status: DC

## 2018-11-16 NOTE — Progress Notes (Signed)
Virtual Visit via Telephone Note  I connected with Miki Hartner Benton-Elliot on 11/16/18 at  2:20 PM EDT by telephone and verified that I am speaking with the correct person using two identifiers.   I discussed the limitations, risks, security and privacy concerns of performing an evaluation and management service by telephone and the availability of in person appointments. I also discussed with the patient that there may be a patient responsible charge related to this service. The patient expressed understanding and agreed to proceed.   History of Present Illness: Patient was evaluated through phone session.  On his last visit we started him on Lamictal.  He admitted much improvement in his mood and irritability and he does not have as much outbursts and panic attack.  He is taking 50 mg daily.  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  He is sleeping good.  He denies any anger, suicidal thoughts or homicidal thought.  He reported no tremors, shakes, rash or any itching.  He is hoping once business started to open up he may able to bounce back but currently his financial situation is not better.  He is not drinking or using any illegal substances.  His energy level is okay.  He is wondering if the dose can further increase since it did help.     Past Psychiatric History:Reviewed. H/O inpatient at behavioral [facility] center for overdose on Ambien. History of taking overdose on Ambien 2 other times but did not require inpatient treatment. History of mood swing, impulsive behavior, speeding ticket, anger, road rage. No history of psychosis or hallucination.  Observations/Objective: Mental status attention done on the phone.  Patient described his mood anxious but not irritable.  His speech is fast but clear and coherent.  Denies any auditory or visual hallucination.  He denies any active or passive suicidal thoughts or homicidal thought.  He denies any recent aggression or  violence.  He is alert and oriented x3.  His thought process logical and goal-directed.  There were no delusions or paranoia.  His fund of knowledge is adequate.  His cognition is intact.  His insight and judgment is okay.  Assessment and Plan: Major depressive disorder, recurrent.  Generalized anxiety disorder.  Patient doing better since we started the Lamictal.  He reported no rash or itching.  Recommend to try Lamictal 75 mg for 1 week and then take 100 mg daily.  Reinforce if medicine cause rash then he need to stop the medicine and call us immediately.  Continue Zoloft 100 mg daily, hydroxyzine 100 mg at bedtime and trazodone as needed for insomnia.  He is not interested in therapy.  Recommended to call us back if is any question or any concern.  Follow-up in 3 months.  Follow Up Instructions:    I discussed the assessment and treatment plan with the patient. The patient was provided an opportunity to ask questions and all were answered. The patient agreed with the plan and demonstrated an understanding of the instructions.   The patient was advised to call back or seek an in-person evaluation if the symptoms worsen or if the condition fails to improve as anticipated.  I provided 20 minutes of non-face-to-face time during this encounter.   Cleotis Nipper, MD

## 2018-12-14 ENCOUNTER — Encounter: Payer: Self-pay | Admitting: Family Medicine

## 2018-12-14 DIAGNOSIS — H52203 Unspecified astigmatism, bilateral: Secondary | ICD-10-CM | POA: Diagnosis not present

## 2018-12-14 DIAGNOSIS — H2513 Age-related nuclear cataract, bilateral: Secondary | ICD-10-CM | POA: Diagnosis not present

## 2018-12-14 DIAGNOSIS — H5213 Myopia, bilateral: Secondary | ICD-10-CM | POA: Diagnosis not present

## 2018-12-14 DIAGNOSIS — H524 Presbyopia: Secondary | ICD-10-CM | POA: Diagnosis not present

## 2018-12-14 DIAGNOSIS — Z794 Long term (current) use of insulin: Secondary | ICD-10-CM | POA: Diagnosis not present

## 2018-12-14 DIAGNOSIS — E119 Type 2 diabetes mellitus without complications: Secondary | ICD-10-CM | POA: Diagnosis not present

## 2018-12-14 LAB — HM DIABETES EYE EXAM

## 2018-12-15 DIAGNOSIS — G4733 Obstructive sleep apnea (adult) (pediatric): Secondary | ICD-10-CM | POA: Diagnosis not present

## 2018-12-19 DIAGNOSIS — E118 Type 2 diabetes mellitus with unspecified complications: Secondary | ICD-10-CM | POA: Diagnosis not present

## 2018-12-19 DIAGNOSIS — Z794 Long term (current) use of insulin: Secondary | ICD-10-CM | POA: Diagnosis not present

## 2018-12-27 ENCOUNTER — Encounter: Payer: Self-pay | Admitting: Family Medicine

## 2018-12-27 DIAGNOSIS — H2512 Age-related nuclear cataract, left eye: Secondary | ICD-10-CM | POA: Diagnosis not present

## 2018-12-27 DIAGNOSIS — H2511 Age-related nuclear cataract, right eye: Secondary | ICD-10-CM | POA: Diagnosis not present

## 2019-01-03 DIAGNOSIS — H2511 Age-related nuclear cataract, right eye: Secondary | ICD-10-CM | POA: Diagnosis not present

## 2019-01-16 ENCOUNTER — Ambulatory Visit (INDEPENDENT_AMBULATORY_CARE_PROVIDER_SITE_OTHER): Payer: BC Managed Care – PPO | Admitting: Psychiatry

## 2019-01-16 ENCOUNTER — Other Ambulatory Visit: Payer: Self-pay

## 2019-01-16 ENCOUNTER — Encounter (HOSPITAL_COMMUNITY): Payer: Self-pay | Admitting: Psychiatry

## 2019-01-16 DIAGNOSIS — F411 Generalized anxiety disorder: Secondary | ICD-10-CM

## 2019-01-16 DIAGNOSIS — F330 Major depressive disorder, recurrent, mild: Secondary | ICD-10-CM

## 2019-01-16 MED ORDER — HYDROXYZINE PAMOATE 50 MG PO CAPS: ORAL_CAPSULE | ORAL | 0 refills | Status: DC

## 2019-01-16 MED ORDER — LAMOTRIGINE 100 MG PO TABS: 100.0000 mg | ORAL_TABLET | Freq: Every day | ORAL | 0 refills | Status: DC

## 2019-01-16 MED ORDER — SERTRALINE HCL 100 MG PO TABS: ORAL_TABLET | ORAL | 0 refills | Status: DC

## 2019-01-16 NOTE — Progress Notes (Signed)
Virtual Visit via Telephone Note  I connected with Matthew Hutchinson on 01/16/19 at  1:40 PM EDT by telephone and verified that I am speaking with the correct person using two identifiers.   I discussed the limitations, risks, security and privacy concerns of performing an evaluation and management service by telephone and the availability of in person appointments. I also discussed with the patient that there may be a patient responsible charge related to this service. The patient expressed understanding and agreed to proceed.   History of Present Illness: Patient was evaluated through phone session.  On his last visit we increase Lamictal to 100 mg.  He admitted much improvement in his mood irritability and anger.  He does not leave his house unless he has to go to the grocery or his business.  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  He is also pleased that his husband back to work but is still doing virtual work.  Her husband works at replacement and he is calling the clients from work.  Patient admitted since taking the Lamictal he does not have a rage or any severe anger.  He is still not happy with the current situation but it does not bother him as much.  He denies drinking or using any illegal substances.  He is sleeping good and he has cut down his trazodone and takes only when he cannot sleep.  He has plenty of trazodone and he does not need a new prescription.  Patient has no rash, itching tremors or shakes with the Lamictal.  He feels the current situation is making him anxious but he is handling much better than few months ago.  He like to continue his current medication.   Past Psychiatric History:Reviewed. [facility]. History of taking overdose on Ambien 2 other times but did not require inpatient treatment. History of  mood swing, impulsive behavior, speeding ticket, anger, road rage. No history of psychosis or hallucination.  Psychiatric Specialty Exam: Physical Exam  ROS  There were no vitals taken for this visit.There is no height or weight on file to calculate BMI.  General Appearance: NA  Eye Contact:  NA  Speech:  Clear and Coherent  Volume:  Increased  Mood:  Anxious  Affect:  NA  Thought Process:  Goal Directed  Orientation:  Full (Time, Place, and Person)  Thought Content:  Logical and Rumination  Suicidal Thoughts:  No  Homicidal Thoughts:  No  Memory:  Immediate;   Good Recent;   Good Remote;   Good  Judgement:  Good  Insight:  Good  Psychomotor Activity:  NA  Concentration:  Concentration: Good and Attention Span: Good  Recall:  Good  Fund of Knowledge:  Good  Language:  Good  Akathisia:  No  Handed:  Right  AIMS (if indicated):     Assets:  Communication Skills Desire for Improvement Housing Resilience Social Support Transportation  ADL's:  Intact  Cognition:  WNL  Sleep:   good      Assessment and Plan: Major depressive disorder, recurrent.  Generalized anxiety disorder.  Patient continued to do better with the Lamictal, Zoloft and hydroxyzine combination.  He is not taking trazodone every night since he is sleeping better.  He has no rash or itching.  I will continue Lamictal 100 mg daily, Zoloft 100 mg  daily and hydroxyzine 100 mg at bedtime.  Discussed medication side effects and benefits.  Recommended to call us back if he need a refill on trazodone.  Recommended to call us back if he has any question, concern or if he feels worsening of the symptom.  Follow-up in 3 months.  Follow Up Instructions:    I discussed the assessment and treatment plan with the patient. The patient was provided an opportunity to ask questions and all were answered. The patient agreed with the plan and demonstrated an understanding of the instructions.   The patient was advised to  call back or seek an in-person evaluation if the symptoms worsen or if the condition fails to improve as anticipated.  I provided 20 minutes of non-face-to-face time during this encounter.   Cleotis NipperSyed T Torian Thoennes, MD

## 2019-01-24 ENCOUNTER — Other Ambulatory Visit: Payer: Self-pay | Admitting: Family Medicine

## 2019-01-24 DIAGNOSIS — G8929 Other chronic pain: Secondary | ICD-10-CM

## 2019-01-24 DIAGNOSIS — M79605 Pain in left leg: Secondary | ICD-10-CM

## 2019-01-24 NOTE — Telephone Encounter (Signed)
Requested Prescriptions  Pending Prescriptions Disp Refills  . gabapentin (NEURONTIN) 300 MG capsule [Pharmacy Med Name: GABAPENTIN 300MG  CAPSULES] 360 capsule 2    Sig: TAKE 2 CAPSULES BY MOUTH TWICE DAILY. Tok     Neurology: Anticonvulsants - gabapentin Passed - 01/24/2019 12:59 PM      Passed - Valid encounter within last 12 months    Recent Outpatient Visits          5 months ago Type 2 diabetes mellitus with hyperglycemia, without long-term current use of insulin [facility])   [facility], Ranell Patrick, MD   6 months ago Type 2 diabetes mellitus with hyperglycemia, without long-term current use of insulin [facility])   [facility], Ranell Patrick, MD   7 months ago Type 2 diabetes mellitus with hyperglycemia, without long-term current use of insulin [facility])   [facility], Ranell Patrick, MD   8 months ago Type 2 diabetes mellitus with hyperglycemia, without long-term current use of insulin [facility])   [facility], Ranell Patrick, MD   1 year ago Annual physical exam   [facility], Ranell Patrick, MD

## 2019-02-02 ENCOUNTER — Other Ambulatory Visit (HOSPITAL_COMMUNITY): Payer: Self-pay | Admitting: Psychiatry

## 2019-02-02 DIAGNOSIS — E118 Type 2 diabetes mellitus with unspecified complications: Secondary | ICD-10-CM | POA: Diagnosis not present

## 2019-02-02 DIAGNOSIS — Z794 Long term (current) use of insulin: Secondary | ICD-10-CM | POA: Diagnosis not present

## 2019-02-02 DIAGNOSIS — F330 Major depressive disorder, recurrent, mild: Secondary | ICD-10-CM

## 2019-02-02 DIAGNOSIS — F411 Generalized anxiety disorder: Secondary | ICD-10-CM

## 2019-02-05 DIAGNOSIS — Z48810 Encounter for surgical aftercare following surgery on the sense organs: Secondary | ICD-10-CM | POA: Diagnosis not present

## 2019-02-05 DIAGNOSIS — Z9842 Cataract extraction status, left eye: Secondary | ICD-10-CM | POA: Diagnosis not present

## 2019-02-05 DIAGNOSIS — Z9841 Cataract extraction status, right eye: Secondary | ICD-10-CM | POA: Diagnosis not present

## 2019-02-05 DIAGNOSIS — Z961 Presence of intraocular lens: Secondary | ICD-10-CM | POA: Diagnosis not present

## 2019-02-13 ENCOUNTER — Other Ambulatory Visit (HOSPITAL_COMMUNITY): Payer: Self-pay

## 2019-02-13 DIAGNOSIS — F330 Major depressive disorder, recurrent, mild: Secondary | ICD-10-CM

## 2019-02-13 DIAGNOSIS — F411 Generalized anxiety disorder: Secondary | ICD-10-CM

## 2019-02-13 MED ORDER — SERTRALINE HCL 100 MG PO TABS: ORAL_TABLET | ORAL | 0 refills | Status: DC

## 2019-02-13 MED ORDER — LAMOTRIGINE 100 MG PO TABS: 100.0000 mg | ORAL_TABLET | Freq: Every day | ORAL | 0 refills | Status: DC

## 2019-02-13 MED ORDER — TRAZODONE HCL 100 MG PO TABS: ORAL_TABLET | ORAL | 0 refills | Status: DC

## 2019-02-13 MED ORDER — HYDROXYZINE PAMOATE 50 MG PO CAPS: ORAL_CAPSULE | ORAL | 0 refills | Status: DC

## 2019-02-13 NOTE — Progress Notes (Signed)
All patients prescriptions were sent to [facility], patient is not using them anymore, I resent all prescriptions to [facility] per patient request

## 2019-02-14 ENCOUNTER — Ambulatory Visit (INDEPENDENT_AMBULATORY_CARE_PROVIDER_SITE_OTHER): Payer: BLUE CROSS/BLUE SHIELD | Admitting: Family Medicine

## 2019-02-14 ENCOUNTER — Encounter: Payer: Self-pay | Admitting: Family Medicine

## 2019-02-14 ENCOUNTER — Ambulatory Visit (INDEPENDENT_AMBULATORY_CARE_PROVIDER_SITE_OTHER): Payer: BC Managed Care – PPO | Admitting: Internal Medicine

## 2019-02-14 ENCOUNTER — Other Ambulatory Visit: Payer: Self-pay

## 2019-02-14 ENCOUNTER — Encounter: Payer: Self-pay | Admitting: Internal Medicine

## 2019-02-14 VITALS — BP 128/80 | HR 95 | Ht 71.0 in | Wt 360.8 lb

## 2019-02-14 DIAGNOSIS — E1165 Type 2 diabetes mellitus with hyperglycemia: Secondary | ICD-10-CM

## 2019-02-14 DIAGNOSIS — G4733 Obstructive sleep apnea (adult) (pediatric): Secondary | ICD-10-CM | POA: Diagnosis not present

## 2019-02-14 DIAGNOSIS — I10 Essential (primary) hypertension: Secondary | ICD-10-CM | POA: Diagnosis not present

## 2019-02-14 DIAGNOSIS — J438 Other emphysema: Secondary | ICD-10-CM | POA: Diagnosis not present

## 2019-02-14 DIAGNOSIS — E785 Hyperlipidemia, unspecified: Secondary | ICD-10-CM | POA: Diagnosis not present

## 2019-02-14 DIAGNOSIS — E119 Type 2 diabetes mellitus without complications: Secondary | ICD-10-CM

## 2019-02-14 MED ORDER — LANTUS SOLOSTAR 100 UNIT/ML ~~LOC~~ SOPN: 50.0000 [IU] | PEN_INJECTOR | Freq: Every day | SUBCUTANEOUS | 5 refills | Status: DC

## 2019-02-14 MED ORDER — METFORMIN HCL 500 MG PO TABS: 500.0000 mg | ORAL_TABLET | Freq: Two times a day (BID) | ORAL | 1 refills | Status: DC

## 2019-02-14 MED ORDER — LOSARTAN POTASSIUM-HCTZ 100-25 MG PO TABS: 1.0000 | ORAL_TABLET | Freq: Every day | ORAL | 1 refills | Status: DC

## 2019-02-14 MED ORDER — AMLODIPINE BESYLATE 5 MG PO TABS: 5.0000 mg | ORAL_TABLET | Freq: Every day | ORAL | 2 refills | Status: DC

## 2019-02-14 MED ORDER — SIMVASTATIN 20 MG PO TABS: ORAL_TABLET | ORAL | 2 refills | Status: DC

## 2019-02-14 NOTE — Assessment & Plan Note (Signed)
He has benefited from CPAP with excellent compliance and improved sleep. Machine is now old and has a whistling air-leak. Plan- replace old machine, auto 5-20, mask of choice, humidifier and supplies, Airview/ card

## 2019-02-14 NOTE — Progress Notes (Signed)
02/14/2019- 15 yoM former smoker with hx of OSA, seeking to re-establish and needing to replace old, malfunctioning CPAP machine.  Medical problem list includes Morbid Obesity, HBP, CAD, COPD, DM2, Depression, Chronic Pain NPSG 06/08/09 AHI 14.8/ hr, desaturation to 83%, body weight 298 lbs He has been fully compliant with CPAP. Machine is older than 5 years. There is a whistling airleak related to the humidifier.  Uses nasal pillows.  Sleeps much better with CPAP, noting that his uvula begins to swell if he tries to sleep without it.  ENT surgery+ tonsils.  Body weight today 360 lbs Works daytime job as Engineer, building services job.  Had worked in past with Dr Gwenette Greet- CPAP 8.  Prior to Admission medications   Medication Sig Start Date End Date Taking? Authorizing Provider  amLODipine (NORVASC) 5 MG tablet Take 1 tablet (5 mg total) by mouth daily. 05/09/18  Yes Wendie Agreste, MD  aspirin 81 MG tablet Take 1 tablet (81 mg total) by mouth daily. 11/13/12  Yes Patrecia Pour, NP  azelastine (ASTELIN) 0.1 % nasal spray  06/29/18  Yes [provider]  b complex vitamins tablet Take 1 tablet by mouth daily.   Yes [provider]  blood glucose meter kit and supplies Dispense based on patient and insurance preference. Use 2-3 times per day. 05/09/18  Yes Wendie Agreste, MD  calcium gluconate 500 MG tablet Take 500 mg by mouth daily.   Yes [provider]  Cholecalciferol (VITAMIN D) 2000 UNITS tablet Take 1 tablet (2,000 Units total) by mouth daily. 11/13/12  Yes Patrecia Pour, NP  gabapentin (NEURONTIN) 300 MG capsule TAKE 2 CAPSULES BY MOUTH TWICE DAILY. GENERIC EQUIVALENT FOR NEURONTIN 01/24/19  Yes Wendie Agreste, MD  hydrOXYzine (VISTARIL) 50 MG capsule TAKE 2 BY MOUTH AT BEDTIME 02/13/19  Yes Arfeen, Arlyce Harman, MD  Insulin Glargine (LANTUS SOLOSTAR) 100 UNIT/ML Solostar Pen Inject 50 Units into the skin daily. Pharm req 30 day supply instead of 90. 10/24/18  Yes Wendie Agreste, MD  Insulin Pen Needle (PEN NEEDLES) 32G X 4 MM MISC 1 application by Does not apply route daily. 05/09/18  Yes Wendie Agreste, MD  lamoTRIgine (LAMICTAL) 100 MG tablet Take 1 tablet (100 mg total) by mouth daily. 02/13/19  Yes Arfeen, Arlyce Harman, MD  losartan-hydrochlorothiazide (HYZAAR) 100-25 MG tablet Take 1 tablet by mouth daily. 09/25/18  Yes Wendie Agreste, MD  Lutein 40 MG CAPS Take 1 capsule by mouth daily.   Yes [provider]  magnesium gluconate (MAGONATE) 500 MG tablet Take 500 mg by mouth daily.   Yes [provider]  metFORMIN (GLUCOPHAGE) 500 MG tablet Take 1 tablet (500 mg total) by mouth 2 (two) times daily with a meal. 10/24/18  Yes Wendie Agreste, MD  Multiple Vitamin (MULTIVITAMIN) capsule Take 1 capsule by mouth daily. 11/13/12  Yes Lord, Asa Saunas, NP  nitroGLYCERIN (NITROSTAT) 0.4 MG SL tablet Place 1 tablet (0.4 mg total) under the tongue every 5 (five) minutes as needed for chest pain. If chest pain not resolved, after 3 doses 5 minutes apart--call 911 11/13/12  Yes Lord, Asa Saunas, NP  PLENVU 140 g SOLR See admin instructions. 07/21/18  Yes [provider]  Potassium 99 MG TABS Take 1 tablet by mouth daily.   Yes [provider]  sertraline (ZOLOFT) 100 MG tablet TAKE 1 TABLET BY MOUTH DAILY 02/13/19  Yes Arfeen, Arlyce Harman, MD  Shark Cartilage 740 MG CAPS Take  1 capsule by mouth 3 (three) times daily.   Yes [provider]  simvastatin (ZOCOR) 20 MG tablet TAKE 1 TABLET BY MOUTH AT BEDTIME, 05/09/18  Yes Wendie Agreste, MD  traMADol (ULTRAM) 50 MG tablet Take 50 mg by mouth every 6 (six) hours as needed for pain.   Yes [provider]  traZODone (DESYREL) 100 MG tablet TAKE 1 BY MOUTH AT BEDTIME 02/13/19  Yes Arfeen, Arlyce Harman, MD   Past Medical History:  Diagnosis Date  . Allergy   . Anxiety   . Arthritis   . COPD (chronic obstructive pulmonary disease) ([place])   . Depression   . Emphysema of lung ([place])   .  Hyperlipidemia   . Morbid obesity ([place])   . OSA (obstructive sleep apnea)    uses CPAP nightly  . PONV (postoperative nausea and vomiting)   . Unspecified essential hypertension    Past Surgical History:  Procedure Laterality Date  . INSERTION OF MESH N/A 09/18/2015   Procedure: INSERTION OF MESH;  Surgeon: Coralie Keens, MD;  Location: [facility];  Service: General;  Laterality: N/A;  . LEG SURGERY Left   . SHOULDER ARTHROSCOPY Left   . TONSILLECTOMY    . UMBILICAL HERNIA REPAIR N/A 09/18/2015   Procedure: HERNIA REPAIR UMBILICAL ADULT WITH MESH ;  Surgeon: Coralie Keens, MD;  Location: [facility];  Service: General;  Laterality: N/A;   Family History  Problem Relation Age of Onset  . Ovarian cancer Mother   . Breast cancer Mother   . Macular degeneration Mother   . Alcohol abuse Mother   . Arthritis Mother   . Emphysema Paternal Grandfather   . Cancer - Other Father        gallbladder cancer  . Alcohol abuse Father   . Cancer Maternal Grandmother   . Heart disease Maternal Grandfather   . Emphysema Paternal Grandmother   . Hyperlipidemia Brother   . Hypertension Brother   . Lung cancer Paternal Uncle   ' Social History   Socioeconomic History  . Marital status: Married    Spouse name: Not on file  . Number of children: 0  . Years of education: Not on file  . Highest education level: Not on file  Occupational History  . Occupation: Psychologist, prison and probation services  Social Needs  . Financial resource strain: Not on file  . Food insecurity    Worry: Not on file    Inability: Not on file  . Transportation needs    Medical: Not on file    Non-medical: Not on file  Tobacco Use  . Smoking status: Former Smoker    Packs/day: 2.00    Years: 26.00    Pack years: 52.00    Types: Cigarettes    Quit date: 08/13/2003    Years since quitting: 15.5  . Smokeless tobacco: Never Used  . Tobacco comment: 2ppd x 26 years  Substance and Sexual Activity  .  Alcohol use: No    Alcohol/week: 0.0 standard drinks    Comment: social  . Drug use: No  . Sexual activity: Yes    Partners: Male    Birth control/protection: None  Lifestyle  . Physical activity    Days per week: Not on file    Minutes per session: Not on file  . Stress: Not on file  Relationships  . Social Herbalist on phone: Not on file    Gets together: Not on file  Attends religious service: Not on file    Active member of club or organization: Not on file    Attends meetings of clubs or organizations: Not on file    Relationship status: Not on file  . Intimate partner violence    Fear of current or ex partner: Not on file    Emotionally abused: Not on file    Physically abused: Not on file    Forced sexual activity: Not on file  Other Topics Concern  . Not on file  Social History Narrative   Married. Education: college. Exercise: No.   ROS-see HPI   + = positive Constitutional:    weight loss, night sweats, fevers, chills, fatigue, lassitude. HEENT:    +headaches, difficulty swallowing, tooth/dental problems, sore throat,       sneezing, itching, ear ache, nasal congestion, post nasal drip, snoring CV:    +chest pain, orthopnea, PND, swelling in lower extremities, anasarca,                                  dizziness, palpitations Resp:  + shortness of breath with exertion or at rest.                productive cough,   +non-productive cough, coughing up of blood.              change in color of mucus.  wheezing.   Skin:    rash or lesions. GI:  No-   heartburn, indigestion, abdominal pain, nausea, vomiting, diarrhea,                 change in bowel habits, loss of appetite GU: dysuria, change in color of urine, no urgency or frequency.   flank pain. MS:   +joint pain, stiffness, decreased range of motion, back pain. Neuro-     nothing unusual Psych:  change in mood or affect.  +depression or anxiety.   memory loss.  OBJ- Physical Exam, + morbidly  obese General- Alert, Oriented, Affect-appropriate, Distress- none acute Skin- rash-none, lesions- none, excoriation- none Lymphadenopathy- none Head- atraumatic            Eyes- Gross vision intact, PERRLA, conjunctivae and secretions clear            Ears- Hearing, canals-normal            Nose- Clear, no-Septal dev, mucus, polyps, erosion, perforation             Throat- Mallampati IV , mucosa clear , drainage- none, tonsils- atrophic Neck- flexible , trachea midline, no stridor , thyroid nl, carotid no bruit Chest - symmetrical excursion , unlabored           Heart/CV- RRR , no murmur , no gallop  , no rub, nl s1 s2                           - JVD- none , edema+1, stasis changes- none, varices- none           Lung- clear to P&A, wheeze- none, cough- none , dullness-none, rub- none           Chest wall-  Abd-  Br/ Gen/ Rectal- Not done, not indicated Extrem- cyanosis- none, clubbing, none, atrophy- none, strength- nl Neuro- grossly intact to observation   

## 2019-02-14 NOTE — Patient Instructions (Signed)
Order- DME [facility]  Please replace old CPAP machine, change to autopap 5-20, mask of choice, humidifier, supplies, AirView/ card   Please call if we can help

## 2019-02-14 NOTE — Progress Notes (Signed)
Subjective:    Patient ID: Matthew Hutchinson, male    DOB: Jan 06, 1961, 58 y.o.   MRN: [id number]  HPI Matthew Hutchinson is a 58 y.o. male Presents today for: Chief Complaint  Patient presents with  . Diabetes    patient is here today to get some of his health maintainenace  . anxiety and depression    Patient's PHQ9= 17 and GAD7=21 but patient states this was due to he went without his medication for 5 weeks. Just went back on medication and it probably take a while before meds get abck in his system good.   Diabetes: Complicated by hyperglycemia, A1c up to 14 in October.  Started insulin last December.  Telemedicine visit in April, 50 units Lantus for the previous few weeks, metformin 500 mg twice daily.  At that time his 01/22/29 day averages were 164, 198, 200 respectively.  Some decreased control at that time thought to be food availability during pandemic and difficult with healthier choices.  Due for repeat A1c. Still on lantus 50 units per day, metformin. Lowest 100-110, no symptomatic lows.  No 200's.  7/07/15/28 day avg. 152, 155, 155 Had cataract surgery, new rx for glasses. Some depth perception issues - discussing with optho as some falls at home. Using cane all the time now. No injury with falls.   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 Not diabetes related.  On ARB, statin.    Microalbumin: Microalbumin 35.1 in April 2019 Optho, foot exam, pneumovax: up to date.   Diabetic Foot Exam - Simple   No data filed       Lab Results  Component Value Date   HGBA1C 8.0 (H) 08/16/2018   HGBA1C 14.0 (A) 05/09/2018   HGBA1C 11.3 (H) 11/04/2017   Lab Results  Component Value Date   MICROALBUR 0.6 06/10/2016   LEast [place]99 05/09/2018   CREATININE 0.84 08/16/2018   Depression with anxiety: Has been off meds for approximately 5 weeks, just restarted.  He is followed by psychiatry, Dr. AAdele Schilder last office visit July 7. Telephone note reviewed  from yesterday were previous prescription sent [facility] - off meds for 5 weeks, then resent to [facility] yesterday.  No current suicidal thoughts. Irritable. No specific homicidal thoughts, intent or plan. Upset at grocery, but has been in contact with corporate.  [facility]ble to recognize symptoms and [facility] worsening [facility].   [facility] 02/14/2019 10/24/2018 07/27/2018 07/19/2018 06/16/2018  Decreased Interest 3 1 0 0 0  Down, Depressed, Hopeless 1 1 0 0 0  PHQ - 2 Score 4 2 0 0 0  Altered sleeping 2 1 - - -  Tired, decreased energy 3 1 - - -  Change in appetite 3 0 - - -  Feeling bad or failure about yourself  0 1 - - -  Trouble concentrating 1 1 - - -  Moving slowly or fidgety/restless 3 1 - - -  Suicidal thoughts 1 0 - - -  PHQ-9 Score 17 7 - - -  Difficult doing work/chores Somewhat difficult - - - -   GAD 7 : Generalized Anxiety Score 02/14/2019  Nervous, Anxious, on Edge 3  Control/stop worrying 3  Worry too much - different things 3  Trouble relaxing 3  Restless 3  Easily annoyed or irritable 3  Afraid - awful might happen 3  Total GAD 7 Score 21  Hypertension: BP Readings from Last 3 Encounters:  02/14/19 140/83  02/14/19 128/80  07/21/18 111/71   Lab Results  Component Value Date   CREATININE 0.84 08/16/2018  tolerating losartan hct 100/'25mg'$  QD, amlodipine '5mg'$ . Home readings  - highest 140/80 - usually controlled in 130/60-70.   Hyperlipidemia:  Lab Results  Component Value Date   CHOL 179 05/09/2018   HDL 39 (L) 05/09/2018   LDLCALC 99 05/09/2018   TRIG 206 (H) 05/09/2018   CHOLHDL 4.6 05/09/2018   Lab Results  Component Value Date   ALT 20 08/16/2018   AST 27 08/16/2018   ALKPHOS 96 08/16/2018   BILITOT 0.4 08/16/2018  zocor '20mg'$  qd.  No new myalgias known.   Chest soreness only when very upset - none now.      Patient Active Problem List   Diagnosis Date Noted  . Newly diagnosed diabetes  ([place]) 07/27/2018  . Morbid obesity ([place]) 02/02/2017  . Chronic pain due to trauma 06/25/2013  . Depressive disorder, not elsewhere classified 11/09/2012  . CAD (coronary artery disease) 02/21/2012  . Lipid disorder 02/21/2012  . Angina pectoris 09/27/2011  . Obstructive sleep apnea 05/20/2010  . Essential hypertension 04/17/2010  . EMPHYSEMA 04/17/2010  . WEIGHT GAIN, ABNORMAL 04/17/2010   Past Medical History:  Diagnosis Date  . Allergy   . Anxiety   . Arthritis   . COPD (chronic obstructive pulmonary disease) ([place])   . Depression   . Emphysema of lung ([place])   . Hyperlipidemia   . Morbid obesity ([place])   . OSA (obstructive sleep apnea)    uses CPAP nightly  . PONV (postoperative nausea and vomiting)   . Unspecified essential hypertension    Past Surgical History:  Procedure Laterality Date  . INSERTION OF MESH N/A 09/18/2015   Procedure: INSERTION OF MESH;  Surgeon: Coralie Keens, MD;  Location: [place];  Service: General;  Laterality: N/A;  . LEG SURGERY Left   . SHOULDER ARTHROSCOPY Left   . TONSILLECTOMY    . UMBILICAL HERNIA REPAIR N/A 09/18/2015   Procedure: HERNIA REPAIR UMBILICAL ADULT WITH MESH ;  Surgeon: Coralie Keens, MD;  Location: [place];  Service: General;  Laterality: N/A;   Allergies  Allergen Reactions  . Penicillins Anaphylaxis    REACTION: anaphylaxis  . Valsartan     REACTION: itch/swelling   Prior to Admission medications   Medication Sig Start Date End Date Taking? Authorizing Provider  amLODipine (NORVASC) 5 MG tablet Take 1 tablet (5 mg total) by mouth daily. 05/09/18  Yes Wendie Agreste, MD  aspirin 81 MG tablet Take 1 tablet (81 mg total) by mouth daily. 11/13/12  Yes Patrecia Pour, NP  azelastine (ASTELIN) 0.1 % nasal spray  06/29/18  Yes [provider]  b complex vitamins tablet Take 1 tablet by mouth daily.   Yes [provider]  blood glucose meter kit and supplies Dispense  based on patient and insurance preference. Use 2-3 times per day. 05/09/18  Yes Wendie Agreste, MD  calcium gluconate 500 MG tablet Take 500 mg by mouth daily.   Yes [provider]  Cholecalciferol (VITAMIN D) 2000 UNITS tablet Take 1 tablet (2,000 Units total) by mouth daily. 11/13/12  Yes Patrecia Pour, NP  gabapentin (NEURONTIN) 300 MG capsule TAKE 2 CAPSULES BY MOUTH TWICE DAILY. GENERIC EQUIVALENT FOR NEURONTIN 01/24/19  Yes Wendie Agreste, MD  hydrOXYzine (VISTARIL) 50 MG capsule TAKE 2 BY MOUTH AT BEDTIME  02/13/19  Yes Arfeen, Arlyce Harman, MD  Insulin Glargine (LANTUS SOLOSTAR) 100 UNIT/ML Solostar Pen Inject 50 Units into the skin daily. Pharm req 30 day supply instead of 90. 10/24/18  Yes Wendie Agreste, MD  Insulin Pen Needle (PEN NEEDLES) 32G X 4 MM MISC 1 application by Does not apply route daily. 05/09/18  Yes Wendie Agreste, MD  lamoTRIgine (LAMICTAL) 100 MG tablet Take 1 tablet (100 mg total) by mouth daily. 02/13/19  Yes Arfeen, Arlyce Harman, MD  losartan-hydrochlorothiazide (HYZAAR) 100-25 MG tablet Take 1 tablet by mouth daily. 09/25/18  Yes Wendie Agreste, MD  Lutein 40 MG CAPS Take 1 capsule by mouth daily.   Yes [provider]  magnesium gluconate (MAGONATE) 500 MG tablet Take 500 mg by mouth daily.   Yes [provider]  metFORMIN (GLUCOPHAGE) 500 MG tablet Take 1 tablet (500 mg total) by mouth 2 (two) times daily with a meal. 10/24/18  Yes Wendie Agreste, MD  Multiple Vitamin (MULTIVITAMIN) capsule Take 1 capsule by mouth daily. 11/13/12  Yes Lord, Asa Saunas, NP  nitroGLYCERIN (NITROSTAT) 0.4 MG SL tablet Place 1 tablet (0.4 mg total) under the tongue every 5 (five) minutes as needed for chest pain. If chest pain not resolved, after 3 doses 5 minutes apart--call 911 11/13/12  Yes Lord, Asa Saunas, NP  PLENVU 140 g SOLR See admin instructions. 07/21/18  Yes [provider]  Potassium 99 MG TABS Take 1 tablet by mouth daily.   Yes [provider]  sertraline (ZOLOFT) 100 MG tablet TAKE 1 TABLET BY MOUTH DAILY 02/13/19  Yes Arfeen, Arlyce Harman, MD  Shark Cartilage 740 MG CAPS Take 1 capsule by mouth 3 (three) times daily.   Yes [provider]  simvastatin (ZOCOR) 20 MG tablet TAKE 1 TABLET BY MOUTH AT BEDTIME, 05/09/18  Yes Wendie Agreste, MD  traMADol (ULTRAM) 50 MG tablet Take 50 mg by mouth every 6 (six) hours as needed for pain.   Yes [provider]  traZODone (DESYREL) 100 MG tablet TAKE 1 BY MOUTH AT BEDTIME 02/13/19  Yes Arfeen, Arlyce Harman, MD   Social History   Socioeconomic History  . Marital status: Married    Spouse name: Not on file  . Number of children: 0  . Years of education: Not on file  . Highest education level: Not on file  Occupational History  . Occupation: Psychologist, prison and probation services  Social Needs  . Financial resource strain: Not on file  . Food insecurity    Worry: Not on file    Inability: Not on file  . Transportation needs    Medical: Not on file    Non-medical: Not on file  Tobacco Use  . Smoking status: Former Smoker    Packs/day: 2.00    Years: 26.00    Pack years: 52.00    Types: Cigarettes    Quit date: 08/13/2003    Years since quitting: 15.5  . Smokeless tobacco: Never Used  . Tobacco comment: 2ppd x 26 years  Substance and Sexual Activity  . Alcohol use: No    Alcohol/week: 0.0 standard drinks    Comment: social  . Drug use: No  . Sexual activity: Yes    Partners: Male    Birth control/protection: None  Lifestyle  . Physical activity    Days per week: Not on file    Minutes per session: Not on file  . Stress: Not on file  Relationships  . Social  connections    Talks on phone: Not on file    Gets together: Not on file    Attends religious service: Not on file    Active member of club or organization: Not on file    Attends meetings of clubs or organizations: Not on file    Relationship status: Not on file  . Intimate partner violence    Fear of current or ex  partner: Not on file    Emotionally abused: Not on file    Physically abused: Not on file    Forced sexual activity: Not on file  Other Topics Concern  . Not on file  Social History Narrative   Married. Education: college. Exercise: No.    Review of Systems Per HPI.      Objective:   Physical Exam Vitals signs reviewed.  Constitutional:      Appearance: He is well-developed.  HENT:     Head: Normocephalic and atraumatic.  Eyes:     Pupils: Pupils are equal, round, and reactive to light.  Neck:     Vascular: No carotid bruit or JVD.  Cardiovascular:     Rate and Rhythm: Normal rate and regular rhythm.     Heart sounds: Normal heart sounds. No murmur.  Pulmonary:     Effort: Pulmonary effort is normal.     Breath sounds: Normal breath sounds. No rales.  Musculoskeletal:     Right lower leg: Edema (1+ pedal bilat. ) present.     Left lower leg: Edema present.  Skin:    General: Skin is warm and dry.  Neurological:     Mental Status: He is alert and oriented to person, place, and time.    Vitals:   02/14/19 1448  BP: 140/83  Pulse: 96  Resp: 16  Temp: 98.5 F (36.9 C)  TempSrc: Oral  SpO2: 100%  Weight: (!) 360 lb 6.4 oz (163.5 kg)      Assessment & Plan:   DAYLYN AZBILL is a 58 y.o. male Hyperlipidemia, unspecified hyperlipidemia type - Plan: Comprehensive metabolic panel, Lipid panel, simvastatin (ZOCOR) 20 MG tablet  - tolerating statin.  check labs.   Essential hypertension - Plan: Comprehensive metabolic panel, losartan-hydrochlorothiazide (HYZAAR) 100-25 MG tablet  -  Stable, tolerating current regimen. Medications refilled. Labs pending as above.   Type 2 diabetes mellitus with hyperglycemia, without long-term current use of insulin (HCC) - Plan: Hemoglobin A1c, HM Diabetes Foot Exam, Insulin Glargine (LANTUS SOLOSTAR) 100 UNIT/ML Solostar Pen Type 2 diabetes mellitus without complication, without long-term current use of insulin ([place]) - Plan:  metFORMIN (GLUCOPHAGE) 500 MG tablet  - no med changes for now, check A1c.   Depression, anxiety  -Now back on medications, followed by psychiatry.  Denies homicidal/suicidal ideation.  Flare of symptoms off meds but is improving. Follow-up with his psychiatrist.  ER precautions if acute worsening  Meds ordered this encounter  Medications  . simvastatin (ZOCOR) 20 MG tablet    Sig: TAKE 1 TABLET BY MOUTH AT BEDTIME,    Dispense:  90 tablet    Refill:  2  . losartan-hydrochlorothiazide (HYZAAR) 100-25 MG tablet    Sig: Take 1 tablet by mouth daily.    Dispense:  90 tablet    Refill:  1  . amLODipine (NORVASC) 5 MG tablet    Sig: Take 1 tablet (5 mg total) by mouth daily.    Dispense:  90 tablet    Refill:  2  . metFORMIN (GLUCOPHAGE) 500 MG  tablet    Sig: Take 1 tablet (500 mg total) by mouth 2 (two) times daily with a meal.    Dispense:  180 tablet    Refill:  1  . Insulin Glargine (LANTUS SOLOSTAR) 100 UNIT/ML Solostar Pen    Sig: Inject 50 Units into the skin daily. Pharm req 30 day supply instead of 90.    Dispense:  5 pen    Refill:  5   Patient Instructions     No change with diabetes meds today.  I will check A1c to determine if any new recommendations.  Thank you for coming in today, and take care.  Return to the clinic or go to the nearest emergency room if any of your symptoms worsen or new symptoms occur.    If you have lab work done today you will be contacted with your lab results within the next 2 weeks.  If you have not heard from [place] then please contact us. The fastest way to get your results is to register for My Chart.   IF you received an x-ray today, you will receive an invoice from [facility]. Please contact [facility] at [phone] with questions or concerns regarding your invoice.   IF you received labwork today, you will receive an invoice from [place]. Please contact [facility] at [phone] with questions or concerns regarding  your invoice.   Our billing staff will not be able to assist you with questions regarding bills from these companies.  You will be contacted with the lab results as soon as they are available. The fastest way to get your results is to activate your My Chart account. Instructions are located on the last page of this paperwork. If you have not heard from [place] regarding the results in 2 weeks, please contact this office.

## 2019-02-14 NOTE — Patient Instructions (Addendum)
   No change with diabetes meds today.  I will check A1c to determine if any new recommendations.  Thank you for coming in today, and take care.  Return to the clinic or go to the nearest emergency room if any of your symptoms worsen or new symptoms occur.    If you have lab work done today you will be contacted with your lab results within the next 2 weeks.  If you have not heard from [place] then please contact us. The fastest way to get your results is to register for My Chart.   IF you received an x-ray today, you will receive an invoice from [facility]. Please contact [facility] at [phone] with questions or concerns regarding your invoice.   IF you received labwork today, you will receive an invoice from [place]. Please contact [facility] at [phone] with questions or concerns regarding your invoice.   Our billing staff will not be able to assist you with questions regarding bills from these companies.  You will be contacted with the lab results as soon as they are available. The fastest way to get your results is to activate your My Chart account. Instructions are located on the last page of this paperwork. If you have not heard from [place] regarding the results in 2 weeks, please contact this office.

## 2019-02-14 NOTE — Assessment & Plan Note (Signed)
Has gained 60 lbs since last sleep study in 2010. This is detrimental to several of his medical problems. Consider Bariatric/ Healthy Weight referral.

## 2019-02-14 NOTE — Assessment & Plan Note (Signed)
This problem will need to be updated at a future visit.

## 2019-02-15 LAB — COMPREHENSIVE METABOLIC PANEL
ALT: 25 IU/L (ref 0–44)
AST: 27 IU/L (ref 0–40)
Albumin/Globulin Ratio: 2 (ref 1.2–2.2)
Albumin: 4.6 g/dL (ref 3.8–4.9)
Alkaline Phosphatase: 95 IU/L (ref 39–117)
BUN/Creatinine Ratio: 10 (ref 9–20)
BUN: 9 mg/dL (ref 6–24)
Bilirubin Total: 0.6 mg/dL (ref 0.0–1.2)
CO2: 20 mmol/L (ref 20–29)
Calcium: 9.3 mg/dL (ref 8.7–10.2)
Chloride: 100 mmol/L (ref 96–106)
Creatinine, Ser: 0.93 mg/dL (ref 0.76–1.27)
GFR calc Af Amer: 104 mL/min/{1.73_m2} (ref >59)
GFR calc non Af Amer: 90 mL/min/{1.73_m2} (ref >59)
Globulin, Total: 2.3 g/dL (ref 1.5–4.5)
Glucose: 194 mg/dL — ABNORMAL HIGH (ref 65–99)
Potassium: 4.3 mmol/L (ref 3.5–5.2)
Sodium: 136 mmol/L (ref 134–144)
Total Protein: 6.9 g/dL (ref 6.0–8.5)

## 2019-02-15 LAB — LIPID PANEL
Chol/HDL Ratio: 3.1 ratio (ref 0.0–5.0)
Cholesterol, Total: 133 mg/dL (ref 100–199)
HDL: 43 mg/dL (ref >39)
LDL Calculated: 65 mg/dL (ref 0–99)
Triglycerides: 127 mg/dL (ref 0–149)
VLDL Cholesterol Cal: 25 mg/dL (ref 5–40)

## 2019-02-15 LAB — HEMOGLOBIN A1C
Est. average glucose Bld gHb Est-mCnc: 166 mg/dL
Hgb A1c MFr Bld: 7.4 % — ABNORMAL HIGH (ref 4.8–5.6)

## 2019-02-18 ENCOUNTER — Encounter: Payer: Self-pay | Admitting: Family Medicine

## 2019-02-19 ENCOUNTER — Other Ambulatory Visit (HOSPITAL_COMMUNITY): Payer: Self-pay

## 2019-02-19 DIAGNOSIS — F330 Major depressive disorder, recurrent, mild: Secondary | ICD-10-CM

## 2019-02-26 DIAGNOSIS — G4733 Obstructive sleep apnea (adult) (pediatric): Secondary | ICD-10-CM | POA: Diagnosis not present

## 2019-03-08 DIAGNOSIS — E118 Type 2 diabetes mellitus with unspecified complications: Secondary | ICD-10-CM | POA: Diagnosis not present

## 2019-03-08 DIAGNOSIS — Z794 Long term (current) use of insulin: Secondary | ICD-10-CM | POA: Diagnosis not present

## 2019-03-14 ENCOUNTER — Encounter: Payer: Self-pay | Admitting: Family Medicine

## 2019-03-16 ENCOUNTER — Other Ambulatory Visit: Payer: Self-pay

## 2019-03-16 ENCOUNTER — Other Ambulatory Visit: Payer: Self-pay | Admitting: *Deleted

## 2019-03-16 ENCOUNTER — Ambulatory Visit (INDEPENDENT_AMBULATORY_CARE_PROVIDER_SITE_OTHER): Payer: BC Managed Care – PPO | Admitting: Family Medicine

## 2019-03-16 DIAGNOSIS — Z23 Encounter for immunization: Secondary | ICD-10-CM

## 2019-03-16 DIAGNOSIS — E1165 Type 2 diabetes mellitus with hyperglycemia: Secondary | ICD-10-CM

## 2019-03-16 MED ORDER — PEN NEEDLES 32G X 4 MM MISC: 1.0000 "application " | Freq: Every day | 3 refills | Status: DC

## 2019-03-16 NOTE — Patient Instructions (Signed)
Pt tolerated injection well

## 2019-03-20 DIAGNOSIS — Z794 Long term (current) use of insulin: Secondary | ICD-10-CM | POA: Diagnosis not present

## 2019-03-20 DIAGNOSIS — E118 Type 2 diabetes mellitus with unspecified complications: Secondary | ICD-10-CM | POA: Diagnosis not present

## 2019-03-21 DIAGNOSIS — E118 Type 2 diabetes mellitus with unspecified complications: Secondary | ICD-10-CM | POA: Diagnosis not present

## 2019-03-21 DIAGNOSIS — Z794 Long term (current) use of insulin: Secondary | ICD-10-CM | POA: Diagnosis not present

## 2019-03-29 DIAGNOSIS — G4733 Obstructive sleep apnea (adult) (pediatric): Secondary | ICD-10-CM | POA: Diagnosis not present

## 2019-04-18 ENCOUNTER — Encounter (HOSPITAL_COMMUNITY): Payer: Self-pay | Admitting: Psychiatry

## 2019-04-18 ENCOUNTER — Other Ambulatory Visit: Payer: Self-pay

## 2019-04-18 ENCOUNTER — Ambulatory Visit (INDEPENDENT_AMBULATORY_CARE_PROVIDER_SITE_OTHER): Payer: BC Managed Care – PPO | Admitting: Psychiatry

## 2019-04-18 DIAGNOSIS — F330 Major depressive disorder, recurrent, mild: Secondary | ICD-10-CM

## 2019-04-18 DIAGNOSIS — F411 Generalized anxiety disorder: Secondary | ICD-10-CM | POA: Diagnosis not present

## 2019-04-18 MED ORDER — HYDROXYZINE PAMOATE 50 MG PO CAPS: ORAL_CAPSULE | ORAL | 0 refills | Status: DC

## 2019-04-18 MED ORDER — TRAZODONE HCL 100 MG PO TABS: ORAL_TABLET | ORAL | 0 refills | Status: DC

## 2019-04-18 MED ORDER — LAMOTRIGINE 100 MG PO TABS: 100.0000 mg | ORAL_TABLET | Freq: Every day | ORAL | 0 refills | Status: DC

## 2019-04-18 MED ORDER — SERTRALINE HCL 100 MG PO TABS: ORAL_TABLET | ORAL | 0 refills | Status: DC

## 2019-04-18 NOTE — Progress Notes (Signed)
Virtual Visit via Telephone Note  I connected with Matthew Hutchinson on 04/18/19 at  2:20 PM EDT by telephone and verified that I am speaking with the correct person using two identifiers.   I discussed the limitations, risks, security and privacy concerns of performing an evaluation and management service by telephone and the availability of in person appointments. I also discussed with the patient that there may be a patient responsible charge related to this service. The patient expressed understanding and agreed to proceed.   History of Present Illness: Patient was evaluated through phone session.  Though he is under a lot of financial stress because his husband recently lost a job but he is handling better than he anticipated.  His visit is going fairly okay.  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  He is sleeping good.  He does not want to change medication because he feels things are going well.  He is concerned about his husband back in few months he will get Social Security disability and eligible for Medicare.  Denies any anger or rage.  He denies any crying spells or any feeling of hopelessness or worthlessness.  Recently he saw his physician and his hemoglobin A1c is 7.4 which actually dropped from 11.  He is feeling better he has more energy.  He is taking trazodone, Lamictal, hydroxyzine and Zoloft.  He has no rash, itching tremors or shakes.  His energy level is good.  Denies drinking or using any illegal substances.  Past Psychiatric History:Reviewed[facility]. History of taking overdose on Ambien 2 other times but did not require inpatient treatment. History of mood swing, impulsive behavior, speeding ticket, anger, road rage. No history of psychosis or hallucination.  Recent Results (from the past 2160 hour(s))  Comprehensive metabolic panel     Status: Abnormal   Collection Time: 02/14/19  3:26  PM  Result Value Ref Range   Glucose 194 (H) 65 - 99 mg/dL   BUN 9 6 - 24 mg/dL   Creatinine, Ser 0.93 0.76 - 1.27 mg/dL   GFR calc non Af Amer 90 >59 mL/min/1.73   GFR calc Af Amer 104 >59 mL/min/1.73   BUN/Creatinine Ratio 10 9 - 20   Sodium 136 134 - 144 mmol/L   Potassium 4.3 3.5 - 5.2 mmol/L   Chloride 100 96 - 106 mmol/L   CO2 20 20 - 29 mmol/L   Calcium 9.3 8.7 - 10.2 mg/dL   Total Protein 6.9 6.0 - 8.5 g/dL   Albumin 4.6 3.8 - 4.9 g/dL   Globulin, Total 2.3 1.5 - 4.5 g/dL   Albumin/Globulin Ratio 2.0 1.2 - 2.2   Bilirubin Total 0.6 0.0 - 1.2 mg/dL   Alkaline Phosphatase 95 39 - 117 IU/L   AST 27 0 - 40 IU/L   ALT 25 0 - 44 IU/L  Lipid panel     Status: None   Collection Time: 02/14/19  3:26 PM  Result Value Ref Range   Cholesterol, Total 133 100 - 199 mg/dL   Triglycerides 127 0 - 149 mg/dL   HDL 43 >39 mg/dL   VLDL Cholesterol Cal 25 5 - 40 mg/dL   LDL Calculated 65 0 - 99 mg/dL   Chol/HDL Ratio 3.1 0.0 - 5.0 ratio    Comment:  T. Chol/HDL Ratio                                             Men  Women                               1/2 Avg.Risk  3.4    3.3                                   Avg.Risk  5.0    4.4                                2X Avg.Risk  9.6    7.1                                3X Avg.Risk 23.4   11.0   Hemoglobin A1c     Status: Abnormal   Collection Time: 02/14/19  3:26 PM  Result Value Ref Range   Hgb A1c MFr Bld 7.4 (H) 4.8 - 5.6 %    Comment:          Prediabetes: 5.7 - 6.4          Diabetes: >6.4          Glycemic control for adults with diabetes: <7.0    Est. average glucose Bld gHb Est-mCnc 166 mg/dL     Psychiatric Specialty Exam: Physical Exam  ROS  There were no vitals taken for this visit.There is no height or weight on file to calculate BMI.  General Appearance: NA  Eye Contact:  NA  Speech:  Clear and Coherent and increase tone  Volume:  Normal  Mood:  Anxious  Affect:  NA  Thought  Process:  Goal Directed  Orientation:  Full (Time, Place, and Person)  Thought Content:  Rumination  Suicidal Thoughts:  No  Homicidal Thoughts:  No  Memory:  Immediate;   Good Recent;   Good Remote;   Good  Judgement:  Good  Insight:  Good  Psychomotor Activity:  NA  Concentration:  Concentration: Good and Attention Span: Good  Recall:  Good  Fund of Knowledge:  Good  Language:  Good  Akathisia:  No  Handed:  Right  AIMS (if indicated):     Assets:  Communication Skills Desire for Improvement Housing Resilience Social Support  ADL's:  Intact  Cognition:  WNL  Sleep:   ok      Assessment and Plan: Major depressive disorder, recurrent.  Generalized anxiety disorder.  Patient doing better on his medication.  Despite financial stress he is handling the situation better.  I reviewed his blood work results.  His globin A1c is improved.  I will continue Lamictal 100 mg daily, Zoloft 100 mg daily, hydroxyzine 100 mg at bedtime and trazodone 100 mg at bedtime.  Discussed medication side effects and benefits.  He has no rash or itching.  Encourage healthy lifestyle and watch his calorie intake.  Follow-up in 3 months.  Follow Up Instructions:    I discussed the assessment and treatment plan with the patient. The patient was provided an opportunity to ask questions and  all were answered. The patient agreed with the plan and demonstrated an understanding of the instructions.   The patient was advised to call back or seek an in-person evaluation if the symptoms worsen or if the condition fails to improve as anticipated.  I provided 20 minutes of non-face-to-face time during this encounter.   Kathlee Nations, MD

## 2019-04-19 DIAGNOSIS — Z961 Presence of intraocular lens: Secondary | ICD-10-CM | POA: Diagnosis not present

## 2019-04-28 DIAGNOSIS — G4733 Obstructive sleep apnea (adult) (pediatric): Secondary | ICD-10-CM | POA: Diagnosis not present

## 2019-05-16 ENCOUNTER — Encounter: Payer: Self-pay | Admitting: Family Medicine

## 2019-05-16 ENCOUNTER — Ambulatory Visit (INDEPENDENT_AMBULATORY_CARE_PROVIDER_SITE_OTHER): Payer: BC Managed Care – PPO | Admitting: Family Medicine

## 2019-05-16 ENCOUNTER — Other Ambulatory Visit: Payer: Self-pay

## 2019-05-16 VITALS — BP 140/82 | HR 97 | Temp 98.4°F | Wt 358.2 lb

## 2019-05-16 DIAGNOSIS — Z794 Long term (current) use of insulin: Secondary | ICD-10-CM

## 2019-05-16 DIAGNOSIS — E1165 Type 2 diabetes mellitus with hyperglycemia: Secondary | ICD-10-CM | POA: Diagnosis not present

## 2019-05-16 DIAGNOSIS — Z7189 Other specified counseling: Secondary | ICD-10-CM | POA: Diagnosis not present

## 2019-05-16 DIAGNOSIS — I10 Essential (primary) hypertension: Secondary | ICD-10-CM

## 2019-05-16 LAB — POCT GLYCOSYLATED HEMOGLOBIN (HGB A1C): Hemoglobin A1C: 7 % — AB (ref 4.0–5.6)

## 2019-05-16 LAB — GLUCOSE, POCT (MANUAL RESULT ENTRY): POC Glucose: 133 mg/dl — AB (ref 70–99)

## 2019-05-16 NOTE — Patient Instructions (Addendum)
A1c is better. Remain on 50 units lantus, unless readings in 200's - can increase by 204 units daily, but watch for low blood sugars if you increase dose.   If blood pressure remains in 140's, try 10mg  of amlodipine each day. If that dose works ok and not causing increasing swelling - let me know and I will send a new prescription.    Here is a link to the CDC, which provides information on individuals who may be at increased risk of severe illness with Covid: http://huff.org/    If you have lab work done today you will be contacted with your lab results within the next 2 weeks.  If you have not heard from [place] then please contact us. The fastest way to get your results is to register for My Chart.   IF you received an x-ray today, you will receive an invoice from [facility]. Please contact [facility]iology at [phone] with questions or concerns regarding your invoice.   IF you received labwork today, you will receive an invoice from [place]. Please contact [facility] at [phone] with questions or concerns regarding your invoice.   Our billing staff will not be able to assist you with questions regarding bills from these companies.  You will be contacted with the lab results as soon as they are available. The fastest way to get your results is to activate your My Chart account. Instructions are located on the last page of this paperwork. If you have not heard from [place] regarding the results in 2 weeks, please contact this office.
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
## 2019-05-17 LAB — MICROALBUMIN / CREATININE URINE RATIO
Creatinine, Urine: 135.1 mg/dL
Microalb/Creat Ratio: 20 mg/g{creat} (ref 0–29)
Microalbumin, Urine: 26.7 ug/mL

## 2019-05-29 DIAGNOSIS — G4733 Obstructive sleep apnea (adult) (pediatric): Secondary | ICD-10-CM | POA: Diagnosis not present

## 2019-05-30 DIAGNOSIS — Z794 Long term (current) use of insulin: Secondary | ICD-10-CM | POA: Diagnosis not present

## 2019-05-30 DIAGNOSIS — E118 Type 2 diabetes mellitus with unspecified complications: Secondary | ICD-10-CM | POA: Diagnosis not present

## 2019-06-28 DIAGNOSIS — G4733 Obstructive sleep apnea (adult) (pediatric): Secondary | ICD-10-CM | POA: Diagnosis not present

## 2019-07-18 ENCOUNTER — Ambulatory Visit (INDEPENDENT_AMBULATORY_CARE_PROVIDER_SITE_OTHER): Payer: BC Managed Care – PPO | Admitting: Psychiatry

## 2019-07-18 ENCOUNTER — Encounter (HOSPITAL_COMMUNITY): Payer: Self-pay | Admitting: Psychiatry

## 2019-07-18 ENCOUNTER — Other Ambulatory Visit: Payer: Self-pay

## 2019-07-18 DIAGNOSIS — M79605 Pain in left leg: Secondary | ICD-10-CM

## 2019-07-18 DIAGNOSIS — G8929 Other chronic pain: Secondary | ICD-10-CM | POA: Diagnosis not present

## 2019-07-18 DIAGNOSIS — F330 Major depressive disorder, recurrent, mild: Secondary | ICD-10-CM

## 2019-07-18 DIAGNOSIS — F411 Generalized anxiety disorder: Secondary | ICD-10-CM | POA: Diagnosis not present

## 2019-07-18 MED ORDER — HYDROXYZINE PAMOATE 50 MG PO CAPS: ORAL_CAPSULE | ORAL | 0 refills | Status: DC

## 2019-07-18 MED ORDER — SERTRALINE HCL 100 MG PO TABS: ORAL_TABLET | ORAL | 0 refills | Status: DC

## 2019-07-18 MED ORDER — TRAZODONE HCL 100 MG PO TABS: ORAL_TABLET | ORAL | 0 refills | Status: DC

## 2019-07-18 MED ORDER — LAMOTRIGINE 100 MG PO TABS: 100.0000 mg | ORAL_TABLET | Freq: Every day | ORAL | 0 refills | Status: DC
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
## 2019-07-28 DIAGNOSIS — E118 Type 2 diabetes mellitus with unspecified complications: Secondary | ICD-10-CM | POA: Diagnosis not present

## 2019-07-28 DIAGNOSIS — Z794 Long term (current) use of insulin: Secondary | ICD-10-CM | POA: Diagnosis not present

## 2019-07-29 DIAGNOSIS — G4733 Obstructive sleep apnea (adult) (pediatric): Secondary | ICD-10-CM | POA: Diagnosis not present

## 2019-08-15 ENCOUNTER — Encounter: Payer: Self-pay | Admitting: Family Medicine

## 2019-08-15 ENCOUNTER — Other Ambulatory Visit: Payer: Self-pay

## 2019-08-15 ENCOUNTER — Ambulatory Visit: Payer: BC Managed Care – PPO | Admitting: Family Medicine

## 2019-08-15 VITALS — BP 142/84 | HR 79 | Temp 98.3°F | Ht 71.0 in | Wt 367.0 lb

## 2019-08-15 DIAGNOSIS — Z794 Long term (current) use of insulin: Secondary | ICD-10-CM | POA: Diagnosis not present

## 2019-08-15 DIAGNOSIS — E1165 Type 2 diabetes mellitus with hyperglycemia: Secondary | ICD-10-CM | POA: Diagnosis not present

## 2019-08-15 DIAGNOSIS — F439 Reaction to severe stress, unspecified: Secondary | ICD-10-CM

## 2019-08-15 DIAGNOSIS — I10 Essential (primary) hypertension: Secondary | ICD-10-CM

## 2019-08-15 DIAGNOSIS — E785 Hyperlipidemia, unspecified: Secondary | ICD-10-CM | POA: Diagnosis not present

## 2019-08-15 NOTE — Patient Instructions (Addendum)
   Here is a link to information about the COVID-19 vaccine: SignatureTicket.co.uk COVID-19 Vaccine Information can be found at: PodExchange.nl For questions related to vaccine distribution or appointments, please email [email or web address] or call [phone].   Try cutting back on salty/sugary foods.  Keep up the good work with the healthy foods. I expect readings to improve - blood pressure and A1c.  Hang in there with the stressors. PLease let me know if I can help     If you have lab work done today you will be contacted with your lab results within the next 2 weeks.  If you have not heard from [place] then please contact us. The fastest way to get your results is to register for My Chart.   IF you received an x-ray today, you will receive an invoice from [facility]. Please contact [facility] at [phone] with questions or concerns regarding your invoice.   IF you received labwork today, you will receive an invoice from [facility]. Please contact [facility] at [phone] with questions or concerns regarding your invoice.   Our billing staff will not be able to assist you with questions regarding bills from these companies.  You will be contacted with the lab results as soon as they are available. The fastest way to get your results is to activate your My Chart account. Instructions are located on the last page of this paperwork. If you have not heard from [place] regarding the results in 2 weeks, please contact this office.        If you have lab work done today you will be contacted with your lab results within the next 2 weeks.  If you have not heard from [place] then please contact us. The fastest way to get your results is to register for My Chart.   IF you received an x-ray today, you will receive an invoice from [facility]. Please contact [facility] at [phone] with  questions or concerns regarding your invoice.   IF you received labwork today, you will receive an invoice from [place]. Please contact [facility] at [phone] with questions or concerns regarding your invoice.   Our billing staff will not be able to assist you with questions regarding bills from these companies.  You will be contacted with the lab results as soon as they are available. The fastest way to get your results is to activate your My Chart account. Instructions are located on the last page of this paperwork. If you have not heard from [place] regarding the results in 2 weeks, please contact this office.

## 2019-08-15 NOTE — Progress Notes (Signed)
Subjective:  Patient ID: Matthew Hutchinson, male    DOB: Jul 31, 1960  Age: 59 y.o. MRN: [id number]  CC:  Chief Complaint  Patient presents with  . Follow-up    on type 2 dieabetes, hyperglycemia, and hypertention. pt states he is doing well to control him chronic conditions. pt states hes is eatting more than usual and that is effecting his B.S. leves. he tried doubling his amlodipine as instructed and it caused his legs to sweel badly. pt went back to orriginal dosage and the swelling has gone donwn since.    HPI Matthew Hutchinson presents for   Diabetes: Complicated by hyperglycemia, insulin-dependent. Borderline control with last A1c 7.0.  That has been improved from previous 7.4, we discussed small daily increase of Lantus to minimize risk of hypoglycemia as a November 4 visit Currently at 60-64 units/day Lantus, as well as Metformin 500 mg twice daily.  He is on ARB with losartan hydrochlorothiazide.  On statin, simvastatin 20 mg daily.  Home readings: fasting: 170.  Some dietary indiscretion. Some stress eating.  Trying to limit news to 1 hr/day. Facebook few mins per day. Has been talking to psychiatry. Has had to cut back on interactions  Symptomatic hypoglycemia: none. Lowest 110-120. Few 200's.  Microalbumin: Normal on November 4.  Optho, foot exam, pneumovax: Up-to-date  Lab Results  Component Value Date   HGBA1C 7.0 (A) 05/16/2019   HGBA1C 7.4 (H) 02/14/2019   HGBA1C 8.0 (H) 08/16/2018   Lab Results  Component Value Date   MICROALBUR 0.6 06/10/2016   LDLCALC 65 02/14/2019   CREATININE 0.93 02/14/2019   Hypertension: Currently on amlodipine 5 mg daily, option of higher dosing discussed last visit but did have some increased peripheral edema, back to 5 mg daily at this time.  He is also taking losartan HCTZ 100/25 mg daily. Better at '5mg'$  dose. BP 140/70-80 at home.   BP Readings from Last 3 Encounters:  08/15/19 (!) 142/84  05/16/19 140/82  02/14/19 140/83    Lab Results  Component Value Date   CREATININE 0.93 02/14/2019   Hyperlipidemia: Simvastatin 20 mg nightly.  Lab Results  Component Value Date   CHOL 133 02/14/2019   HDL 43 02/14/2019   LDLCALC 65 02/14/2019   TRIG 127 02/14/2019   CHOLHDL 3.1 02/14/2019   Lab Results  Component Value Date   ALT 25 02/14/2019   AST 27 02/14/2019   ALKPHOS 95 02/14/2019   BILITOT 0.6 02/14/2019   Min activity - limited by ankle symptoms.  Wt Readings from Last 3 Encounters:  08/15/19 (!) 367 lb (166.5 kg)  05/16/19 (!) 358 lb 3.2 oz (162.5 kg)  02/14/19 (!) 360 lb 6.4 oz (163.5 kg)      History Patient Active Problem List   Diagnosis Date Noted  . Newly diagnosed diabetes ([place]) 07/27/2018  . Morbid obesity ([place]) 02/02/2017  . Chronic pain due to trauma 06/25/2013  . Depressive disorder, not elsewhere classified 11/09/2012  . CAD (coronary artery disease) 02/21/2012  . Lipid disorder 02/21/2012  . Angina pectoris 09/27/2011  . Obstructive sleep apnea 05/20/2010  . Essential hypertension 04/17/2010  . EMPHYSEMA 04/17/2010  . WEIGHT GAIN, ABNORMAL 04/17/2010   Past Medical History:  Diagnosis Date  . Allergy   . Anxiety   . Arthritis   . COPD (chronic obstructive pulmonary disease) ([place])   . Depression   . Emphysema of lung ([place])   . Hyperlipidemia   . Morbid obesity ([place])   . OSA (  obstructive sleep apnea)    uses CPAP nightly  . PONV (postoperative nausea and vomiting)   . Unspecified essential hypertension    Past Surgical History:  Procedure Laterality Date  . INSERTION OF MESH N/A 09/18/2015   Procedure: INSERTION OF MESH;  Surgeon: Coralie Keens, MD;  Location: [facility];  Service: General;  Laterality: N/A;  . LEG SURGERY Left   . SHOULDER ARTHROSCOPY Left   . TONSILLECTOMY    . UMBILICAL HERNIA REPAIR N/A 09/18/2015   Procedure: HERNIA REPAIR UMBILICAL ADULT WITH MESH ;  Surgeon: Coralie Keens, MD;  Location: [facility];   Service: General;  Laterality: N/A;   Allergies  Allergen Reactions  . Penicillins Anaphylaxis    REACTION: anaphylaxis  . Valsartan     REACTION: itch/swelling   Prior to Admission medications   Medication Sig Start Date End Date Taking? Authorizing Provider  amLODipine (NORVASC) 5 MG tablet Take 1 tablet (5 mg total) by mouth daily. 02/14/19  Yes Wendie Agreste, MD  aspirin 81 MG tablet Take 1 tablet (81 mg total) by mouth daily. 11/13/12  Yes Patrecia Pour, NP  azelastine (ASTELIN) 0.1 % nasal spray  06/29/18  Yes [provider]  b complex vitamins tablet Take 1 tablet by mouth daily.   Yes [provider]  blood glucose meter kit and supplies Dispense based on patient and insurance preference. Use 2-3 times per day. 05/09/18  Yes Wendie Agreste, MD  calcium gluconate 500 MG tablet Take 500 mg by mouth daily.   Yes [provider]  Cholecalciferol (VITAMIN D) 2000 UNITS tablet Take 1 tablet (2,000 Units total) by mouth daily. 11/13/12  Yes Patrecia Pour, NP  gabapentin (NEURONTIN) 300 MG capsule TAKE 2 CAPSULES BY MOUTH TWICE DAILY. GENERIC EQUIVALENT FOR NEURONTIN 01/24/19  Yes Wendie Agreste, MD  hydrOXYzine (VISTARIL) 50 MG capsule TAKE 2 BY MOUTH AT BEDTIME 07/18/19  Yes Arfeen, Arlyce Harman, MD  Insulin Glargine (LANTUS SOLOSTAR) 100 UNIT/ML Solostar Pen Inject 50 Units into the skin daily. Pharm req 30 day supply instead of 90. 02/14/19  Yes Wendie Agreste, MD  Insulin Pen Needle (PEN NEEDLES) 32G X 4 MM MISC 1 application by Does not apply route daily. 03/16/19  Yes Wendie Agreste, MD  lamoTRIgine (LAMICTAL) 100 MG tablet Take 1 tablet (100 mg total) by mouth daily. 07/18/19  Yes Arfeen, Arlyce Harman, MD  losartan-hydrochlorothiazide (HYZAAR) 100-25 MG tablet Take 1 tablet by mouth daily. 02/14/19  Yes Wendie Agreste, MD  Lutein 40 MG CAPS Take 1 capsule by mouth daily.   Yes [provider]  magnesium gluconate (MAGONATE) 500 MG tablet Take 500 mg by  mouth daily.   Yes [provider]  metFORMIN (GLUCOPHAGE) 500 MG tablet Take 1 tablet (500 mg total) by mouth 2 (two) times daily with a meal. 02/14/19  Yes Wendie Agreste, MD  Multiple Vitamin (MULTIVITAMIN) capsule Take 1 capsule by mouth daily. 11/13/12  Yes Lord, Asa Saunas, NP  nitroGLYCERIN (NITROSTAT) 0.4 MG SL tablet Place 1 tablet (0.4 mg total) under the tongue every 5 (five) minutes as needed for chest pain. If chest pain not resolved, after 3 doses 5 minutes apart--call 911 11/13/12  Yes Lord, Asa Saunas, NP  PLENVU 140 g SOLR See admin instructions. 07/21/18  Yes [provider]  Potassium 99 MG TABS Take 1 tablet by mouth daily.   Yes [provider]  sertraline (ZOLOFT) 100 MG  tablet TAKE 1 TABLET BY MOUTH DAILY 07/18/19  Yes Arfeen, Arlyce Harman, MD  Shark Cartilage 740 MG CAPS Take 1 capsule by mouth 3 (three) times daily.   Yes [provider]  simvastatin (ZOCOR) 20 MG tablet TAKE 1 TABLET BY MOUTH AT BEDTIME, 02/14/19  Yes Wendie Agreste, MD  traMADol (ULTRAM) 50 MG tablet Take 50 mg by mouth every 6 (six) hours as needed for pain.   Yes [provider]  traZODone (DESYREL) 100 MG tablet TAKE 1 BY MOUTH AT BEDTIME 07/18/19  Yes Arfeen, Arlyce Harman, MD   Social History   Socioeconomic History  . Marital status: Married    Spouse name: Not on file  . Number of children: 0  . Years of education: Not on file  . Highest education level: Not on file  Occupational History  . 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 Smoking status: Former Smoker    Packs/day: 2.00    Years: 26.00    Pack years: 52.00    Types: Cigarettes    Quit date: 08/13/2003    Years since quitting: 16.0  . Smokeless tobacco: Never Used  . Tobacco comment: 2ppd x 26 years  Substance and Sexual Activity  . Alcohol use: No    Alcohol/week: 0.0 standard drinks    Comment: social  . Drug use: No  . Sexual activity: Yes    Partners: Male    Birth control/protection: None    Other Topics Concern  . Not on file  Social History Narrative   Married. Education: college. Exercise: No.   Social Determinants of Health   Financial Resource Strain:   . Difficulty of Paying Living Expenses: Not on file  Food Insecurity:   . Worried About Charity fundraiser in the Last Year: Not on file  . Ran Out of Food in the Last Year: Not on file  Transportation Needs:   . Lack of Transportation (Medical): Not on file  . Lack of Transportation (Non-Medical): Not on file  Physical Activity:   . Days of Exercise per Week: Not on file  . Minutes of Exercise per Session: Not on file  Stress:   . Feeling of Stress : Not on file  Social Connections:   . Frequency of Communication with Friends and Family: Not on file  . Frequency of Social Gatherings with Friends and Family: Not on file  . Attends Religious Services: Not on file  . Active Member of Clubs or Organizations: Not on file  . Attends Archivist Meetings: Not on file  . Marital Status: Not on file  Intimate Partner Violence:   . Fear of Current or Ex-Partner: Not on file  . Emotionally Abused: Not on file  . Physically Abused: Not on file  . Sexually Abused: Not on file    Review of Systems  Constitutional: Negative for fatigue and unexpected weight change.  Eyes: Negative for visual disturbance.  Respiratory: Negative for cough, chest tightness and shortness of breath (no new dyspnea. ).   Cardiovascular: Negative for chest pain, palpitations and leg swelling (now improved. ).  Gastrointestinal: Negative for abdominal pain and blood in stool.  Neurological: Negative for dizziness, light-headedness and headaches.     Objective:   Vitals:   08/15/19 1631  BP: (!) 142/84  Pulse: 79  Temp: 98.3 F (36.8 C)  TempSrc: Temporal  SpO2: 92%  Weight: (!) 367 lb (166.5 kg)  Height: '5\' 11"'$  (1.803 m)  Physical Exam Vitals reviewed.  Constitutional:      Appearance: He is well-developed.   HENT:     Head: Normocephalic and atraumatic.  Eyes:     Pupils: Pupils are equal, round, and reactive to light.  Neck:     Vascular: No carotid bruit or JVD.  Cardiovascular:     Rate and Rhythm: Normal rate and regular rhythm.     Heart sounds: Normal heart sounds. No murmur.  Pulmonary:     Effort: Pulmonary effort is normal.     Breath sounds: Normal breath sounds. No rales.  Skin:    General: Skin is warm and dry.  Neurological:     Mental Status: He is alert and oriented to person, place, and time.        Assessment & Plan:  JAWANN URBANI is a 59 y.o. male . Type 2 diabetes mellitus with hyperglycemia, with long-term current use of insulin (HCC) - Plan: Hemoglobin A1c  - check A1c. Anticipate some decreased control with recent dietary indiscretion/stressors. Plans on improved control of diet, minimizing starches/sugary foods. Continue lantus same dose for now, metformin same dose.   Essential hypertension - Plan: Comprehensive metabolic panel, Lipid panel  -Borderline, with some dietary indiscretion as above.  Avoid further increases in due to pedal edema worsening.  If persistent elevation, additional agent may need to be added.  Hyperlipidemia, unspecified hyperlipidemia type - Plan: Comprehensive metabolic panel  Tolerating current regimen, No changes.  Labs pending  Situational stress  -Limiting exposure to news/social media discussed, but he has made some attempts to limit exposure to the stressors.  Some family stress with visitation with his elderly parent as well.  Continue follow-up with psychiatry.  Stress management discussed.  No orders of the defined types were placed in this encounter.  Patient Instructions       If you have lab work done today you will be contacted with your lab results within the next 2 weeks.  If you have not heard from [place] then please contact us. The fastest way to get your results is to register for My Chart.   IF you  received an x-ray today, you will receive an invoice from [facility]. Please contact [facility] at [phone] with questions or concerns regarding your invoice.   IF you received labwork today, you will receive an invoice from [place]. Please contact [facility] at [phone] with questions or concerns regarding your invoice.   Our billing staff will not be able to assist you with questions regarding bills from these companies.  You will be contacted with the lab results as soon as they are available. The fastest way to get your results is to activate your My Chart account. Instructions are located on the last page of this paperwork. If you have not heard from [place] regarding the results in 2 weeks, please contact this office.        If you have lab work done today you will be contacted with your lab results within the next 2 weeks.  If you have not heard from [place] then please contact us. The fastest way to get your results is to register for My Chart.   IF you received an x-ray today, you will receive an invoice from [facility]. Please contact [facility] at [phone] with questions or concerns regarding your invoice.   IF you received labwork today, you will receive an invoice from [place]. Please contact [facility] at [phone] with questions or concerns regarding  your invoice.   Our billing staff will not be able to assist you with questions regarding bills from these companies.  You will be contacted with the lab results as soon as they are available. The fastest way to get your results is to activate your My Chart account. Instructions are located on the last page of this paperwork. If you have not heard from [place] regarding the results in 2 weeks, please contact this office.         Signed, Merri Ray, MD [facility]

## 2019-08-16 LAB — COMPREHENSIVE METABOLIC PANEL
ALT: 24 IU/L (ref 0–44)
AST: 22 IU/L (ref 0–40)
Albumin/Globulin Ratio: 2 (ref 1.2–2.2)
Albumin: 4.7 g/dL (ref 3.8–4.9)
Alkaline Phosphatase: 99 IU/L (ref 39–117)
BUN/Creatinine Ratio: 10 (ref 9–20)
BUN: 10 mg/dL (ref 6–24)
Bilirubin Total: 0.6 mg/dL (ref 0.0–1.2)
CO2: 19 mmol/L — ABNORMAL LOW (ref 20–29)
Calcium: 9.6 mg/dL (ref 8.7–10.2)
Chloride: 103 mmol/L (ref 96–106)
Creatinine, Ser: 1.05 mg/dL (ref 0.76–1.27)
GFR calc Af Amer: 90 mL/min/{1.73_m2} (ref >59)
GFR calc non Af Amer: 78 mL/min/{1.73_m2} (ref >59)
Globulin, Total: 2.4 g/dL (ref 1.5–4.5)
Glucose: 149 mg/dL — ABNORMAL HIGH (ref 65–99)
Potassium: 4.2 mmol/L (ref 3.5–5.2)
Sodium: 139 mmol/L (ref 134–144)
Total Protein: 7.1 g/dL (ref 6.0–8.5)

## 2019-08-16 LAB — LIPID PANEL
Chol/HDL Ratio: 3.6 ratio (ref 0.0–5.0)
Cholesterol, Total: 160 mg/dL (ref 100–199)
HDL: 44 mg/dL (ref >39)
LDL Chol Calc (NIH): 92 mg/dL (ref 0–99)
Triglycerides: 137 mg/dL (ref 0–149)
VLDL Cholesterol Cal: 24 mg/dL (ref 5–40)

## 2019-08-16 LAB — HEMOGLOBIN A1C
Est. average glucose Bld gHb Est-mCnc: 189 mg/dL
Hgb A1c MFr Bld: 8.2 % — ABNORMAL HIGH (ref 4.8–5.6)

## 2019-08-17 ENCOUNTER — Other Ambulatory Visit: Payer: Self-pay | Admitting: Family Medicine

## 2019-08-17 DIAGNOSIS — I10 Essential (primary) hypertension: Secondary | ICD-10-CM

## 2019-08-23 ENCOUNTER — Encounter: Payer: Self-pay | Admitting: Family Medicine

## 2019-08-24 NOTE — Telephone Encounter (Signed)
Pt is needing refill on his diabetic supplies

## 2019-08-29 DIAGNOSIS — G4733 Obstructive sleep apnea (adult) (pediatric): Secondary | ICD-10-CM | POA: Diagnosis not present

## 2019-09-26 DIAGNOSIS — G4733 Obstructive sleep apnea (adult) (pediatric): Secondary | ICD-10-CM | POA: Diagnosis not present

## 2019-10-16 ENCOUNTER — Ambulatory Visit (INDEPENDENT_AMBULATORY_CARE_PROVIDER_SITE_OTHER): Payer: BC Managed Care – PPO | Admitting: Psychiatry

## 2019-10-16 ENCOUNTER — Other Ambulatory Visit: Payer: Self-pay

## 2019-10-16 ENCOUNTER — Encounter (HOSPITAL_COMMUNITY): Payer: Self-pay | Admitting: Psychiatry

## 2019-10-16 DIAGNOSIS — F330 Major depressive disorder, recurrent, mild: Secondary | ICD-10-CM | POA: Diagnosis not present

## 2019-10-16 DIAGNOSIS — F411 Generalized anxiety disorder: Secondary | ICD-10-CM | POA: Diagnosis not present

## 2019-10-16 MED ORDER — HYDROXYZINE PAMOATE 50 MG PO CAPS: ORAL_CAPSULE | ORAL | 0 refills | Status: DC

## 2019-10-16 MED ORDER — TRAZODONE HCL 100 MG PO TABS: ORAL_TABLET | ORAL | 0 refills | Status: DC

## 2019-10-16 MED ORDER — SERTRALINE HCL 100 MG PO TABS: ORAL_TABLET | ORAL | 0 refills | Status: DC

## 2019-10-16 MED ORDER — LAMOTRIGINE 100 MG PO TABS: 100.0000 mg | ORAL_TABLET | Freq: Every day | ORAL | 0 refills | Status: DC

## 2019-10-16 NOTE — Progress Notes (Signed)
Virtual Visit via Telephone Note  I connected with Matthew Hutchinson on 10/16/19 at  3:00 PM EDT by telephone and verified that I am speaking with the correct person using two identifiers.   I discussed the limitations, risks, security and privacy concerns of performing an evaluation and management service by telephone and the availability of in person appointments. I also discussed with the patient that there may be a patient responsible charge related to this service. The patient expressed understanding and agreed to proceed.   History of Present Illness: Patient was evaluated by phone session.  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  Recently he has seen his PCP and he has hemoglobin A1c 8.2.  He admitted that his sedentary lifestyle is not helping him and he need to motivate and lose some weight.  He feels the current medicine is working and he does not need to change it.  He sleeps good.  He denies any irritability, mania, psychosis or any crying spells.  Denies any feeling of hopelessness.  He still gets sometimes upset when he here news on the national television and he had tried to avoid watching news.  He admitted weight gain but he is hoping to stop exercise and watch his calorie intake.   Past Psychiatric History:Reviewed. [facility]. H/O overdose on Ambien 2 other times but did not require inpatient treatment. History of mood swing, impulsive behavior, speeding ticket, anger, road rage. No history of psychosis or hallucination.   Recent Results (from the past 2160 hour(s))  Comprehensive metabolic panel     Status: Abnormal   Collection Time: 08/15/19  5:29 PM  Result Value Ref Range   Glucose 149 (H) 65 - 99 mg/dL   BUN 10 6 - 24 mg/dL   Creatinine, Ser 1.05 0.76 - 1.27 mg/dL   GFR calc non Af Amer 78 >59 mL/min/1.73   GFR calc Af Amer 90 >59 mL/min/1.73   BUN/Creatinine Ratio 10 9 - 20   Sodium 139  134 - 144 mmol/L   Potassium 4.2 3.5 - 5.2 mmol/L   Chloride 103 96 - 106 mmol/L   CO2 19 (L) 20 - 29 mmol/L   Calcium 9.6 8.7 - 10.2 mg/dL   Total Protein 7.1 6.0 - 8.5 g/dL   Albumin 4.7 3.8 - 4.9 g/dL   Globulin, Total 2.4 1.5 - 4.5 g/dL   Albumin/Globulin Ratio 2.0 1.2 - 2.2   Bilirubin Total 0.6 0.0 - 1.2 mg/dL   Alkaline Phosphatase 99 39 - 117 IU/L   AST 22 0 - 40 IU/L   ALT 24 0 - 44 IU/L  Lipid panel     Status: None   Collection Time: 08/15/19  5:29 PM  Result Value Ref Range   Cholesterol, Total 160 100 - 199 mg/dL   Triglycerides 137 0 - 149 mg/dL   HDL 44 >39 mg/dL   VLDL Cholesterol Cal 24 5 - 40 mg/dL   LDL Chol Calc (NIH) 92 0 - 99 mg/dL   Chol/HDL Ratio 3.6 0.0 - 5.0 ratio    Comment:                                   T. Chol/HDL Ratio  Men  Women                               1/2 Avg.Risk  3.4    3.3                                   Avg.Risk  5.0    4.4                                2X Avg.Risk  9.6    7.1                                3X Avg.Risk 23.4   11.0   Hemoglobin A1c     Status: Abnormal   Collection Time: 08/15/19  5:29 PM  Result Value Ref Range   Hgb A1c MFr Bld 8.2 (H) 4.8 - 5.6 %    Comment:          Prediabetes: 5.7 - 6.4          Diabetes: >6.4          Glycemic control for adults with diabetes: <7.0    Est. average glucose Bld gHb Est-mCnc 189 mg/dL     Psychiatric Specialty Exam: Physical Exam  Review of Systems  There were no vitals taken for this visit.There is no height or weight on file to calculate BMI.  General Appearance: NA  Eye Contact:  NA  Speech:  Clear and Coherent and increase tone  Volume:  Normal  Mood:  Anxious  Affect:  NA  Thought Process:  Goal Directed  Orientation:  Full (Time, Place, and Person)  Thought Content:  WDL  Suicidal Thoughts:  No  Homicidal Thoughts:  No  Memory:  Immediate;   Good Recent;   Good Remote;   Good  Judgement:  Good  Insight:   Good  Psychomotor Activity:  NA  Concentration:  Concentration: Fair and Attention Span: Fair  Recall:  Good  Fund of Knowledge:  Good  Language:  Good  Akathisia:  No  Handed:  Right  AIMS (if indicated):     Assets:  Communication Skills Desire for Improvement Housing Resilience Social Support Talents/Skills  ADL's:  Intact  Cognition:  WNL  Sleep:   ok     Assessment and Plan: Major depressive disorder, recurrent.  Generalized anxiety disorder.  Patient is a stable on his current medication.  He does not want to change.  Continue Zoloft 100 mg daily, hydroxyzine 100 mg at bedtime and trazodone 1 mg at bedtime and Lamictal 100 mg daily.  He has no rash, itching tremors or shakes.  I reviewed his blood work.  His hemoglobin A1c is slightly increase from the past and reinforce to watch his calorie intake and try to walk 10 to 15 minutes 3-4 times a week.  Discussed medication side effects and benefits.  Recommended to call us back if is any question or any concern.  Follow-up in 3 months.  Follow Up Instructions:    I discussed the assessment and treatment plan with the patient. The patient was provided an opportunity to ask questions and all were answered. The patient agreed with the plan and demonstrated an understanding of the instructions.   The  patient was advised to call back or seek an in-person evaluation if the symptoms worsen or if the condition fails to improve as anticipated.  I provided 20 minutes of non-face-to-face time during this encounter.   Cleotis Nipper, MD

## 2019-10-27 DIAGNOSIS — G4733 Obstructive sleep apnea (adult) (pediatric): Secondary | ICD-10-CM | POA: Diagnosis not present

## 2019-11-01 ENCOUNTER — Other Ambulatory Visit: Payer: Self-pay | Admitting: Family Medicine

## 2019-11-01 DIAGNOSIS — E1165 Type 2 diabetes mellitus with hyperglycemia: Secondary | ICD-10-CM

## 2019-11-01 DIAGNOSIS — G8929 Other chronic pain: Secondary | ICD-10-CM

## 2019-11-01 DIAGNOSIS — E119 Type 2 diabetes mellitus without complications: Secondary | ICD-10-CM

## 2019-11-14 ENCOUNTER — Ambulatory Visit: Payer: BC Managed Care – PPO | Admitting: Family Medicine

## 2019-11-14 ENCOUNTER — Other Ambulatory Visit: Payer: Self-pay

## 2019-11-14 ENCOUNTER — Encounter: Payer: Self-pay | Admitting: Family Medicine

## 2019-11-14 VITALS — BP 131/85 | HR 80 | Temp 98.1°F | Ht 71.0 in | Wt 362.0 lb

## 2019-11-14 DIAGNOSIS — Z794 Long term (current) use of insulin: Secondary | ICD-10-CM

## 2019-11-14 DIAGNOSIS — Z6841 Body Mass Index (BMI) 40.0 and over, adult: Secondary | ICD-10-CM | POA: Diagnosis not present

## 2019-11-14 DIAGNOSIS — I10 Essential (primary) hypertension: Secondary | ICD-10-CM | POA: Diagnosis not present

## 2019-11-14 DIAGNOSIS — E1165 Type 2 diabetes mellitus with hyperglycemia: Secondary | ICD-10-CM | POA: Diagnosis not present

## 2019-11-14 NOTE — Progress Notes (Signed)
Subjective:  Patient ID: Matthew Hutchinson, male    DOB: Jan 20, 1961  Age: 59 y.o. MRN: [id number]  CC:  Chief Complaint  Patient presents with  . Follow-up    x3 months on Diabetes. pt has been doing well with his diabetes. pt dose say on 2 occations he had '350mg'$ /dl readings and states on these occations he had done nothing out of his daily rotine.     HPI Matthew Hutchinson presents for   Diabetes: Complicated by hyperglycemia, obesity.  Insulin-dependent Body mass index is 50.49 kg/m.   Elevated readings in January with A1c 8.2.  Previously had been improving control from 7.4-7.0 in August to November of last year. Some dietary indiscretion at last visit.  Stress eating. Initial plan of adjustment with diet, continued on same dose of Lantus as well as Metformin.  60 to 64 units/day of Lantus, Metformin 500 mg twice daily.  He does take ARB with losartan HCTZ and statin simvastatin 20 mg daily.   Has been trying to do better with diet, but some jumps to 350, 380. Unknown reason. 2 episodes only since last visit. No missed doses.  Fastings: average 180's to 212 for 30 days. No postprandials. .  No symptomatic lows. Rare 90's - 1-2 times per month if not eating soon enough.  Usually 1 big meal at night. Snack midday at times. Awake 11am, sleep 3-4 am. Dinner 7pm, nuts as snack later. Snack at 1pm. Nausea if eating too soon in morning.  Weight down 5# since last visit.  On 60 units lantus (if 140-160 range), up to 64 units if numbers running higher.  Saw nutritionist in Jan 2020.  Has appt with hand specialist for trigger finger tomorrow.   Wt Readings from Last 3 Encounters:  11/14/19 (!) 362 lb (164.2 kg)  08/15/19 (!) 367 lb (166.5 kg)  05/16/19 (!) 358 lb 3.2 oz (162.5 kg)   Microalbumin: Normal ratio 05/16/2019. Optho, foot exam, pneumovax: Up-to-date  Had covid vaccines  Lab Results  Component Value Date   HGBA1C 8.2 (H) 08/15/2019   HGBA1C 7.0 (A) 05/16/2019     HGBA1C 7.4 (H) 02/14/2019   Lab Results  Component Value Date   MICROALBUR 0.6 06/10/2016   LDLCALC 92 08/15/2019   CREATININE 1.05 08/15/2019      History Patient Active Problem List   Diagnosis Date Noted  . Newly diagnosed diabetes ([place]) 07/27/2018  . Morbid obesity ([place]) 02/02/2017  . Chronic pain due to trauma 06/25/2013  . Depressive disorder, not elsewhere classified 11/09/2012  . CAD (coronary artery disease) 02/21/2012  . Lipid disorder 02/21/2012  . Angina pectoris 09/27/2011  . Obstructive sleep apnea 05/20/2010  . Essential hypertension 04/17/2010  . EMPHYSEMA 04/17/2010  . WEIGHT GAIN, ABNORMAL 04/17/2010   Past Medical History:  Diagnosis Date  . Allergy   . Anxiety   . Arthritis   . COPD (chronic obstructive pulmonary disease) ([place])   . Depression   . Emphysema of lung ([place])   . Hyperlipidemia   . Morbid obesity ([place])   . OSA (obstructive sleep apnea)    uses CPAP nightly  . PONV (postoperative nausea and vomiting)   . Unspecified essential hypertension    Past Surgical History:  Procedure Laterality Date  . INSERTION OF MESH N/A 09/18/2015   Procedure: INSERTION OF MESH;  Surgeon: Coralie Keens, MD;  Location: [facility];  Service: General;  Laterality: N/A;  . LEG SURGERY Left   . SHOULDER ARTHROSCOPY  Left   . TONSILLECTOMY    . UMBILICAL HERNIA REPAIR N/A 09/18/2015   Procedure: HERNIA REPAIR UMBILICAL ADULT WITH MESH ;  Surgeon: Coralie Keens, MD;  Location: [facility];  Service: General;  Laterality: N/A;   Allergies  Allergen Reactions  . Penicillins Anaphylaxis    REACTION: anaphylaxis  . Valsartan     REACTION: itch/swelling   Prior to Admission medications   Medication Sig Start Date End Date Taking? Authorizing Provider  amLODipine (NORVASC) 5 MG tablet TAKE 1 TABLET BY MOUTH EVERY DAY 11/01/19  Yes Wendie Agreste, MD  aspirin 81 MG tablet Take 1 tablet (81 mg total) by mouth daily. 11/13/12  Yes  Patrecia Pour, NP  azelastine (ASTELIN) 0.1 % nasal spray  06/29/18  Yes [provider]  b complex vitamins tablet Take 1 tablet by mouth daily.   Yes [provider]  blood glucose meter kit and supplies Dispense based on patient and insurance preference. Use 2-3 times per day. 05/09/18  Yes Wendie Agreste, MD  calcium gluconate 500 MG tablet Take 500 mg by mouth daily.   Yes [provider]  Cholecalciferol (VITAMIN D) 2000 UNITS tablet Take 1 tablet (2,000 Units total) by mouth daily. 11/13/12  Yes Patrecia Pour, NP  gabapentin (NEURONTIN) 300 MG capsule TAKE 2 CAPSULES BY MOUTH TWICE A DAY 11/01/19  Yes Wendie Agreste, MD  hydrOXYzine (VISTARIL) 50 MG capsule TAKE 2 BY MOUTH AT BEDTIME 10/16/19  Yes Arfeen, Arlyce Harman, MD  Insulin Pen Needle (PEN NEEDLES) 32G X 4 MM MISC 1 application by Does not apply route daily. 03/16/19  Yes Wendie Agreste, MD  lamoTRIgine (LAMICTAL) 100 MG tablet Take 1 tablet (100 mg total) by mouth daily. 10/16/19  Yes Arfeen, Arlyce Harman, MD  LANTUS SOLOSTAR 100 UNIT/ML Solostar Pen INJECT 50 UNITS INTO THE SKIN DAILY. PHARM REQ 30 DAY SUPPLY INSTEAD OF 90. 11/01/19  Yes Wendie Agreste, MD  losartan-hydrochlorothiazide (HYZAAR) 100-25 MG tablet TAKE 1 TABLET BY MOUTH EVERY DAY 08/17/19  Yes Wendie Agreste, MD  Lutein 40 MG CAPS Take 1 capsule by mouth daily.   Yes [provider]  magnesium gluconate (MAGONATE) 500 MG tablet Take 500 mg by mouth daily.   Yes [provider]  metFORMIN (GLUCOPHAGE) 500 MG tablet TAKE 1 TABLET (500 MG TOTAL) BY MOUTH 2 (TWO) TIMES DAILY WITH A MEAL. 11/01/19  Yes Wendie Agreste, MD  Multiple Vitamin (MULTIVITAMIN) capsule Take 1 capsule by mouth daily. 11/13/12  Yes Lord, Asa Saunas, NP  nitroGLYCERIN (NITROSTAT) 0.4 MG SL tablet Place 1 tablet (0.4 mg total) under the tongue every 5 (five) minutes as needed for chest pain. If chest pain not resolved, after 3 doses 5 minutes apart--call 911 11/13/12   Yes Lord, Asa Saunas, NP  PLENVU 140 g SOLR See admin instructions. 07/21/18  Yes [provider]  Potassium 99 MG TABS Take 1 tablet by mouth daily.   Yes [provider]  sertraline (ZOLOFT) 100 MG tablet TAKE 1 TABLET BY MOUTH DAILY 10/16/19  Yes Arfeen, Arlyce Harman, MD  Shark Cartilage 740 MG CAPS Take 1 capsule by mouth 3 (three) times daily.   Yes [provider]  simvastatin (ZOCOR) 20 MG tablet TAKE 1 TABLET BY MOUTH AT BEDTIME, 02/14/19  Yes Wendie Agreste, MD  traMADol (ULTRAM) 50 MG tablet Take 50 mg by mouth every 6 (six) hours as needed for pain.   Yes [provider]  traZODone (DESYREL) 100 MG tablet TAKE 1 BY MOUTH AT BEDTIME 10/16/19  Yes Arfeen, Arlyce Harman, MD   Social History   Socioeconomic History  . Marital status: Married    Spouse name: Not on file  . Number of children: 0  . Years of education: Not on file  . Highest education level: Not on file  Occupational History  . 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 Smoking status: Former Smoker    Packs/day: 2.00    Years: 26.00    Pack years: 52.00    Types: Cigarettes    Quit date: 08/13/2003    Years since quitting: 16.2  . Smokeless tobacco: Never Used  . Tobacco comment: 2ppd x 26 years  Substance and Sexual Activity  . Alcohol use: No    Alcohol/week: 0.0 standard drinks    Comment: social  . Drug use: No  . Sexual activity: Yes    Partners: Male    Birth control/protection: None  Other Topics Concern  . Not on file  Social History Narrative   Married. Education: college. Exercise: No.   Social Determinants of Health   Financial Resource Strain:   . Difficulty of Paying Living Expenses:   Food Insecurity:   . Worried About Charity fundraiser in the Last Year:   . Arboriculturist in the Last Year:   Transportation Needs:   . Film/video editor (Medical):   Marland Kitchen Lack of Transportation (Non-Medical):   Physical Activity:   . Days of Exercise per Week:   . Minutes of  Exercise per Session:   Stress:   . Feeling of Stress :   Social Connections:   . Frequency of Communication with Friends and Family:   . Frequency of Social Gatherings with Friends and Family:   . Attends Religious Services:   . Active Member of Clubs or Organizations:   . Attends Archivist Meetings:   Marland Kitchen Marital Status:   Intimate Partner Violence:   . Fear of Current or Ex-Partner:   . Emotionally Abused:   Marland Kitchen Physically Abused:   . Sexually Abused:     Review of Systems  Constitutional: Negative for fatigue and unexpected weight change.  Eyes: Negative for visual disturbance.  Respiratory: Negative for cough, chest tightness and shortness of breath.   Cardiovascular: Negative for chest pain, palpitations and leg swelling.  Gastrointestinal: Negative for abdominal pain and blood in stool.  Neurological: Negative for dizziness, light-headedness and headaches.   No new sxs above.   Objective:   Vitals:   11/14/19 1543 11/14/19 1553  BP: (!) 144/82 131/85  Pulse: 80   Temp: 98.1 F (36.7 C)   TempSrc: Temporal   SpO2: 93%   Weight: (!) 362 lb (164.2 kg)   Height: '5\' 11"'$  (1.803 m)     Physical Exam Vitals reviewed.  Constitutional:      General: He is not in acute distress.    Appearance: He is well-developed. He is obese.  HENT:     Head: Normocephalic and atraumatic.  Eyes:     Pupils: Pupils are equal, round, and reactive to light.  Neck:     Vascular: No carotid bruit or JVD.  Cardiovascular:     Rate and Rhythm: Normal rate and regular rhythm.     Heart sounds: Normal heart sounds. No murmur.  Pulmonary:     Effort: Pulmonary effort is normal.     Breath sounds: Normal breath  sounds. No rales.  Skin:    General: Skin is warm and dry.  Neurological:     Mental Status: He is alert and oriented to person, place, and time.        Assessment & Plan:  Matthew Hutchinson is a 59 y.o. male . Type 2 diabetes mellitus with hyperglycemia,  with long-term current use of insulin (HCC) - Plan: Hemoglobin A1c  -check A1c, consider mealtime dosing of short acting insulin with lower basal, continue Metformin same dose for now.  Ideally would have smaller meals more frequently throughout the day, but continue healthy snacks in between large meal once per day at this time.  Hypoglycemia precautions given without regular meals.  Essential hypertension  -Stable, continue same regimen.  BMI 50.0-59.9, adult ([place])  -Commended on some weight loss since last visit.  Continue to watch for stress eating/snacking.  Healthy snacks as above.   No orders of the defined types were placed in this encounter.  Patient Instructions    Depending on A1c level, I would consider mealtime insulin with dinner, possibly lowering basal/long acting insulin slightly. I will let you know. Continue metformin same dose for now, Lantus 60 units for now, continue to try to eat healthy snack throughout the day between your meals and try to have smaller evening meal.  Thanks for coming in today and follow-up in 3 months.  Let me know if there are questions sooner.    Diabetes Mellitus and Nutrition, Adult When you have diabetes (diabetes mellitus), it is very important to have healthy eating habits because your blood sugar (glucose) levels are greatly affected by what you eat and drink. Eating healthy foods in the appropriate amounts, at about the same times every day, can help you:  Control your blood glucose.  Lower your risk of heart disease.  Improve your blood pressure.  Reach or maintain a healthy weight. Every person with diabetes is different, and each person has different needs for a meal plan. Your health care provider may recommend that you work with a diet and nutrition specialist (dietitian) to make a meal plan that is best for you. Your meal plan may vary depending on factors such as:  The calories you need.  The medicines you take.  Your  weight.  Your blood glucose, blood pressure, and cholesterol levels.  Your activity level.  Other health conditions you have, such as heart or kidney disease. How do carbohydrates affect me? Carbohydrates, also called carbs, affect your blood glucose level more than any other type of food. Eating carbs naturally raises the amount of glucose in your blood. Carb counting is a method for keeping track of how many carbs you eat. Counting carbs is important to keep your blood glucose at a healthy level, especially if you use insulin or take certain oral diabetes medicines. It is important to know how many carbs you can safely have in each meal. This is different for every person. Your dietitian can help you calculate how many carbs you should have at each meal and for each snack. Foods that contain carbs include:  Bread, cereal, rice, pasta, and crackers.  Potatoes and corn.  Peas, beans, and lentils.  Milk and yogurt.  Fruit and juice.  Desserts, such as cakes, cookies, ice cream, and candy. How does alcohol affect me? Alcohol can cause a sudden decrease in blood glucose (hypoglycemia), especially if you use insulin or take certain oral diabetes medicines. Hypoglycemia can be a life-threatening condition. Symptoms of  hypoglycemia (sleepiness, dizziness, and confusion) are similar to symptoms of having too much alcohol. If your health care provider says that alcohol is safe for you, follow these guidelines:  Limit alcohol intake to no more than 1 drink per day for nonpregnant women and 2 drinks per day for men. One drink equals 12 oz of beer, 5 oz of wine, or 1 oz of hard liquor.  Do not drink on an empty stomach.  Keep yourself hydrated with water, diet soda, or unsweetened iced tea.  Keep in mind that regular soda, juice, and other mixers may contain a lot of sugar and must be counted as carbs. What are tips for following this plan?  Reading food labels  Start by checking the  serving size on the "Nutrition Facts" label of packaged foods and drinks. The amount of calories, carbs, fats, and other nutrients listed on the label is based on one serving of the item. Many items contain more than one serving per package.  Check the total grams (g) of carbs in one serving. You can calculate the number of servings of carbs in one serving by dividing the total carbs by 15. For example, if a food has 30 g of total carbs, it would be equal to 2 servings of carbs.  Check the number of grams (g) of saturated and trans fats in one serving. Choose foods that have low or no amount of these fats.  Check the number of milligrams (mg) of salt (sodium) in one serving. Most people should limit total sodium intake to less than 2,300 mg per day.  Always check the nutrition information of foods labeled as "low-fat" or "nonfat". These foods may be higher in added sugar or refined carbs and should be avoided.  Talk to your dietitian to identify your daily goals for nutrients listed on the label. Shopping  Avoid buying canned, premade, or processed foods. These foods tend to be high in fat, sodium, and added sugar.  Shop around the outside edge of the grocery store. This includes fresh fruits and vegetables, bulk grains, fresh meats, and fresh dairy. Cooking  Use low-heat cooking methods, such as baking, instead of high-heat cooking methods like deep frying.  Cook using healthy oils, such as olive, canola, or sunflower oil.  Avoid cooking with butter, cream, or high-fat meats. Meal planning  Eat meals and snacks regularly, preferably at the same times every day. Avoid going long periods of time without eating.  Eat foods high in fiber, such as fresh fruits, vegetables, beans, and whole grains. Talk to your dietitian about how many servings of carbs you can eat at each meal.  Eat 4-6 ounces (oz) of lean protein each day, such as lean meat, chicken, fish, eggs, or tofu. One oz of lean  protein is equal to: ? 1 oz of meat, chicken, or fish. ? 1 egg. ?  cup of tofu.  Eat some foods each day that contain healthy fats, such as avocado, nuts, seeds, and fish. Lifestyle  Check your blood glucose regularly.  Exercise regularly as told by your health care provider. This may include: ? 150 minutes of moderate-intensity or vigorous-intensity exercise each week. This could be brisk walking, biking, or water aerobics. ? Stretching and doing strength exercises, such as yoga or weightlifting, at least 2 times a week.  Take medicines as told by your health care provider.  Do not use any products that contain nicotine or tobacco, such as cigarettes and e-cigarettes. If you need help  quitting, ask your health care provider.  Work with a Social worker or diabetes educator to identify strategies to manage stress and any emotional and social challenges. Questions to ask a health care provider  Do I need to meet with a diabetes educator?  Do I need to meet with a dietitian?  What number can I call if I have questions?  When are the best times to check my blood glucose? Where to find more information:  American Diabetes Association: diabetes.org  Academy of Nutrition and Dietetics: www.eatright.[employer] of Diabetes and Digestive and Kidney Diseases (NIH): [employer] Summary  A healthy meal plan will help you control your blood glucose and maintain a healthy lifestyle.  Working with a diet and nutrition specialist (dietitian) can help you make a meal plan that is best for you.  Keep in mind that carbohydrates (carbs) and alcohol have immediate effects on your blood glucose levels. It is important to count carbs and to use alcohol carefully. This information is not intended to replace advice given to you by your health care provider. Make sure you discuss any questions you have with your health care provider. Document Revised: 06/10/2017 Document Reviewed:  08/02/2016 Elsevier Patient Education  [employer].    If you have lab work done today you will be contacted with your lab results within the next 2 weeks.  If you have not heard from [place] then please contact us. The fastest way to get your results is to register for My Chart.   IF you received an x-ray today, you will receive an invoice from [facility]. Please contact [facility] at [phone] with questions or concerns regarding your invoice.   IF you received labwork today, you will receive an invoice from [facility]. Please contact [facility] at [phone] with questions or concerns regarding your invoice.   Our billing staff will not be able to assist you with questions regarding bills from these companies.  You will be contacted with the lab results as soon as they are available. The fastest way to get your results is to activate your My Chart account. Instructions are located on the last page of this paperwork. If you have not heard from [place] regarding the results in 2 weeks, please contact this office.         Signed, Merri Ray, MD [facility]

## 2019-11-14 NOTE — Patient Instructions (Addendum)
Depending on A1c level, I would consider mealtime insulin with dinner, possibly lowering basal/long acting insulin slightly. I will let you know. Continue metformin same dose for now, Lantus 60 units for now, continue to try to eat healthy snack throughout the day between your meals and try to have smaller evening meal.  Thanks for coming in today and follow-up in 3 months.  Let me know if there are questions sooner.    Diabetes Mellitus and Nutrition, Adult When you have diabetes (diabetes mellitus), it is very important to have healthy eating habits because your blood sugar (glucose) levels are greatly affected by what you eat and drink. Eating healthy foods in the appropriate amounts, at about the same times every day, can help you:  Control your blood glucose.  Lower your risk of heart disease.  Improve your blood pressure.  Reach or maintain a healthy weight. Every person with diabetes is different, and each person has different needs for a meal plan. Your health care provider may recommend that you work with a diet and nutrition specialist (dietitian) to make a meal plan that is best for you. Your meal plan may vary depending on factors such as:  The calories you need.  The medicines you take.  Your weight.  Your blood glucose, blood pressure, and cholesterol levels.  Your activity level.  Other health conditions you have, such as heart or kidney disease. How do carbohydrates affect me? Carbohydrates, also called carbs, affect your blood glucose level more than any other type of food. Eating carbs naturally raises the amount of glucose in your blood. Carb counting is a method for keeping track of how many carbs you eat. Counting carbs is important to keep your blood glucose at a healthy level, especially if you use insulin or take certain oral diabetes medicines. It is important to know how many carbs you can safely have in each meal. This is different for every person. Your  dietitian can help you calculate how many carbs you should have at each meal and for each snack. Foods that contain carbs include:  Bread, cereal, rice, pasta, and crackers.  Potatoes and corn.  Peas, beans, and lentils.  Milk and yogurt.  Fruit and juice.  Desserts, such as cakes, cookies, ice cream, and candy. How does alcohol affect me? Alcohol can cause a sudden decrease in blood glucose (hypoglycemia), especially if you use insulin or take certain oral diabetes medicines. Hypoglycemia can be a life-threatening condition. Symptoms of hypoglycemia (sleepiness, dizziness, and confusion) are similar to symptoms of having too much alcohol. If your health care provider says that alcohol is safe for you, follow these guidelines:  Limit alcohol intake to no more than 1 drink per day for nonpregnant women and 2 drinks per day for men. One drink equals 12 oz of beer, 5 oz of wine, or 1 oz of hard liquor.  Do not drink on an empty stomach.  Keep yourself hydrated with water, diet soda, or unsweetened iced tea.  Keep in mind that regular soda, juice, and other mixers may contain a lot of sugar and must be counted as carbs. What are tips for following this plan?  Reading food labels  Start by checking the serving size on the "Nutrition Facts" label of packaged foods and drinks. The amount of calories, carbs, fats, and other nutrients listed on the label is based on one serving of the item. Many items contain more than one serving per package.  Check the total grams (  g) of carbs in one serving. You can calculate the number of servings of carbs in one serving by dividing the total carbs by 15. For example, if a food has 30 g of total carbs, it would be equal to 2 servings of carbs.  Check the number of grams (g) of saturated and trans fats in one serving. Choose foods that have low or no amount of these fats.  Check the number of milligrams (mg) of salt (sodium) in one serving. Most people  should limit total sodium intake to less than 2,300 mg per day.  Always check the nutrition information of foods labeled as "low-fat" or "nonfat". These foods may be higher in added sugar or refined carbs and should be avoided.  Talk to your dietitian to identify your daily goals for nutrients listed on the label. Shopping  Avoid buying canned, premade, or processed foods. These foods tend to be high in fat, sodium, and added sugar.  Shop around the outside edge of the grocery store. This includes fresh fruits and vegetables, bulk grains, fresh meats, and fresh dairy. Cooking  Use low-heat cooking methods, such as baking, instead of high-heat cooking methods like deep frying.  Cook using healthy oils, such as olive, canola, or sunflower oil.  Avoid cooking with butter, cream, or high-fat meats. Meal planning  Eat meals and snacks regularly, preferably at the same times every day. Avoid going long periods of time without eating.  Eat foods high in fiber, such as fresh fruits, vegetables, beans, and whole grains. Talk to your dietitian about how many servings of carbs you can eat at each meal.  Eat 4-6 ounces (oz) of lean protein each day, such as lean meat, chicken, fish, eggs, or tofu. One oz of lean protein is equal to: ? 1 oz of meat, chicken, or fish. ? 1 egg. ?  cup of tofu.  Eat some foods each day that contain healthy fats, such as avocado, nuts, seeds, and fish. Lifestyle  Check your blood glucose regularly.  Exercise regularly as told by your health care provider. This may include: ? 150 minutes of moderate-intensity or vigorous-intensity exercise each week. This could be brisk walking, biking, or water aerobics. ? Stretching and doing strength exercises, such as yoga or weightlifting, at least 2 times a week.  Take medicines as told by your health care provider.  Do not use any products that contain nicotine or tobacco, such as cigarettes and e-cigarettes. If you need  help quitting, ask your health care provider.  Work with a Veterinary surgeon or diabetes educator to identify strategies to manage stress and any emotional and social challenges. Questions to ask a health care provider  Do I need to meet with a diabetes educator?  Do I need to meet with a dietitian?  What number can I call if I have questions?  When are the best times to check my blood glucose? Where to find more information:  American Diabetes Association: diabetes.org  Academy of Nutrition and Dietetics: www.eatright.[employer] of Diabetes and Digestive and Kidney Diseases (NIH): CarFli[employer].tn Summary  A healthy meal plan will help you control your blood glucose and maintain a healthy lifestyle.  Working with a diet and nutrition specialist (dietitian) can help you make a meal plan that is best for you.  Keep in mind that carbohydrates (carbs) and alcohol have immediate effects on your blood glucose levels. It is important to count carbs and to use alcohol carefully. This information is not intended  to replace advice given to you by your health care provider. Make sure you discuss any questions you have with your health care provider. Document Revised: 06/10/2017 Document Reviewed: 08/02/2016 Elsevier Patient Education  [employer].    If you have lab work done today you will be contacted with your lab results within the next 2 weeks.  If you have not heard from [place] then please contact us. The fastest way to get your results is to register for My Chart.   IF you received an x-ray today, you will receive an invoice from [facility]. Please contact [facility] at [phone] with questions or concerns regarding your invoice.   IF you received labwork today, you will receive an invoice from [facility]. Please contact [facility] at [phone] with questions or concerns regarding your invoice.   Our billing staff will not be able to assist you with  questions regarding bills from these companies.  You will be contacted with the lab results as soon as they are available. The fastest way to get your results is to activate your My Chart account. Instructions are located on the last page of this paperwork. If you have not heard from [place] regarding the results in 2 weeks, please contact this office.

## 2019-11-15 DIAGNOSIS — M65331 Trigger finger, right middle finger: Secondary | ICD-10-CM | POA: Diagnosis not present

## 2019-11-15 DIAGNOSIS — M79641 Pain in right hand: Secondary | ICD-10-CM | POA: Diagnosis not present

## 2019-11-15 LAB — HEMOGLOBIN A1C
Est. average glucose Bld gHb Est-mCnc: 203 mg/dL
Hgb A1c MFr Bld: 8.7 % — ABNORMAL HIGH (ref 4.8–5.6)

## 2019-11-26 DIAGNOSIS — G4733 Obstructive sleep apnea (adult) (pediatric): Secondary | ICD-10-CM | POA: Diagnosis not present

## 2019-12-13 DIAGNOSIS — M79641 Pain in right hand: Secondary | ICD-10-CM | POA: Diagnosis not present

## 2019-12-13 DIAGNOSIS — M65331 Trigger finger, right middle finger: Secondary | ICD-10-CM | POA: Diagnosis not present

## 2019-12-24 ENCOUNTER — Other Ambulatory Visit (HOSPITAL_COMMUNITY): Payer: Self-pay | Admitting: Psychiatry

## 2019-12-24 DIAGNOSIS — F330 Major depressive disorder, recurrent, mild: Secondary | ICD-10-CM

## 2019-12-27 DIAGNOSIS — G4733 Obstructive sleep apnea (adult) (pediatric): Secondary | ICD-10-CM | POA: Diagnosis not present

## 2020-01-15 ENCOUNTER — Other Ambulatory Visit: Payer: Self-pay

## 2020-01-15 ENCOUNTER — Telehealth (INDEPENDENT_AMBULATORY_CARE_PROVIDER_SITE_OTHER): Payer: BC Managed Care – PPO | Admitting: Psychiatry

## 2020-01-15 ENCOUNTER — Encounter (HOSPITAL_COMMUNITY): Payer: Self-pay | Admitting: Psychiatry

## 2020-01-15 DIAGNOSIS — F330 Major depressive disorder, recurrent, mild: Secondary | ICD-10-CM | POA: Diagnosis not present

## 2020-01-15 DIAGNOSIS — F411 Generalized anxiety disorder: Secondary | ICD-10-CM | POA: Diagnosis not present

## 2020-01-15 MED ORDER — LAMOTRIGINE 150 MG PO TABS: 150.0000 mg | ORAL_TABLET | Freq: Every day | ORAL | 0 refills | Status: DC

## 2020-01-15 MED ORDER — HYDROXYZINE PAMOATE 50 MG PO CAPS: ORAL_CAPSULE | ORAL | 0 refills | Status: DC

## 2020-01-15 MED ORDER — SERTRALINE HCL 100 MG PO TABS: ORAL_TABLET | ORAL | 0 refills | Status: DC

## 2020-01-15 NOTE — Progress Notes (Signed)
Virtual Visit via Telephone Note  I connected with Juanpablo Ciresi Benton-Elliot on 01/15/20 at  3:00 PM EDT by telephone and verified that I am speaking with the correct person using two identifiers.  Location: Patient: work Provider: home office   I discussed the limitations, risks, security and privacy concerns of performing an evaluation and management service by telephone and the availability of in person appointments. I also discussed with the patient that there may be a patient responsible charge related to this service. The patient expressed understanding and agreed to proceed.   History of Present Illness: Patient is evaluated by phone session.  He admitted lately more frustrated and irritable when he see people not wearing the mask.  Totally understand that he cannot mandate customer and employee but he feels the public should have a common sense since covert is not over yet.  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  He recently had blood work and his hemoglobin A1c jumped from 8.2-8.7.  He admitted not able to watch his caloric intake and not doing exercise but he is hoping to start soon.  He denies any mania or psychosis but does get irritable and frustrated sometimes.  Though he does not watch national news or media but he feels that he gets irritable with the family member and he started to feel isolated.  He is compliant with Lamictal but not taking trazodone for a while because he feel he has vivid dreams from the trazodone.  He is using CPAP and he sleeps better.  He has no shakes, tremors, rash or any itching.  Denies any suicidal thoughts or homicidal thoughts.  He admitted anxiety because of environment, work overall and people around him.  Denies drinking or using any illegal substances.   Past Psychiatric History:Reviewed. [facility]. H/O overdose on Ambien 2 other times but did not require inpatient treatment.  History of mood swing, impulsive behavior, speeding ticket, anger, road rage. No history of psychosis or hallucination.  Recent Results (from the past 2160 hour(s))  Hemoglobin A1c     Status: Abnormal   Collection Time: 11/14/19  4:46 PM  Result Value Ref Range   Hgb A1c MFr Bld 8.7 (H) 4.8 - 5.6 %    Comment:          Prediabetes: 5.7 - 6.4          Diabetes: >6.4          Glycemic control for adults with diabetes: <7.0    Est. average glucose Bld gHb Est-mCnc 203 mg/dL      Psychiatric Specialty Exam: Physical Exam  Review of Systems  Weight (!) 362 lb (164.2 kg).There is no height or weight on file to calculate BMI.  General Appearance: NA  Eye Contact:  NA  Speech:  Clear and Coherent  Volume:  Normal  Mood:  Anxious and sometimes irritible  Affect:  NA  Thought Process:  Goal Directed  Orientation:  Full (Time, Place, and Person)  Thought Content:  Rumination  Suicidal Thoughts:  No  Homicidal Thoughts:  No  Memory:  Immediate;   Good Recent;   Good Remote;   Good  Judgement:  Intact  Insight:  Present  Psychomotor Activity:  NA  Concentration:  Concentration: Good and Attention Span: Good  Recall:  Good  Fund of Knowledge:  Good  Language:  Good  Akathisia:  No  Handed:  Right  AIMS (  if indicated):     Assets:  Communication Skills Desire for Improvement Housing Resilience Social Support  ADL's:  Intact  Cognition:  WNL  Sleep:         Assessment and Plan: Major depressive disorder, recurrent.  Generalized anxiety disorder.  Discussed his episodic irritability and anxiety.  Recommend to trial Lamictal 150 mg daily.  We will not prescribe trazodone since patient feel he had vivid dreams from the trazodone.  He is using CPAP for sleep.  Continue hydroxyzine 100 mg at bedtime and Zoloft 100 mg daily.  Discussed medication side effects and reminded that if he develops rash with the Lamictal then let us know immediately.  I reviewed blood work results.   Encouraged healthy lifestyle and watch his caloric intake.  Follow-up in 3 months.   Follow Up Instructions:    I discussed the assessment and treatment plan with the patient. The patient was provided an opportunity to ask questions and all were answered. The patient agreed with the plan and demonstrated an understanding of the instructions.   The patient was advised to call back or seek an in-person evaluation if the symptoms worsen or if the condition fails to improve as anticipated.  I provided 20 minutes of non-face-to-face time during this encounter.   Cleotis Nipper, MD

## 2020-01-26 DIAGNOSIS — G4733 Obstructive sleep apnea (adult) (pediatric): Secondary | ICD-10-CM | POA: Diagnosis not present

## 2020-02-12 ENCOUNTER — Other Ambulatory Visit: Payer: Self-pay | Admitting: Family Medicine

## 2020-02-12 DIAGNOSIS — E785 Hyperlipidemia, unspecified: Secondary | ICD-10-CM

## 2020-02-14 ENCOUNTER — Ambulatory Visit: Payer: BC Managed Care – PPO | Admitting: Internal Medicine

## 2020-02-14 ENCOUNTER — Ambulatory Visit: Payer: BC Managed Care – PPO | Admitting: Family Medicine

## 2020-02-14 ENCOUNTER — Encounter: Payer: Self-pay | Admitting: Family Medicine

## 2020-02-14 ENCOUNTER — Other Ambulatory Visit: Payer: Self-pay

## 2020-02-14 ENCOUNTER — Other Ambulatory Visit: Payer: Self-pay | Admitting: Family Medicine

## 2020-02-14 VITALS — BP 145/84 | HR 91 | Temp 97.9°F | Resp 16 | Ht 71.0 in | Wt 362.0 lb

## 2020-02-14 DIAGNOSIS — I10 Essential (primary) hypertension: Secondary | ICD-10-CM

## 2020-02-14 DIAGNOSIS — E1165 Type 2 diabetes mellitus with hyperglycemia: Secondary | ICD-10-CM | POA: Diagnosis not present

## 2020-02-14 DIAGNOSIS — Z794 Long term (current) use of insulin: Secondary | ICD-10-CM | POA: Diagnosis not present

## 2020-02-14 DIAGNOSIS — Z6841 Body Mass Index (BMI) 40.0 and over, adult: Secondary | ICD-10-CM

## 2020-02-14 DIAGNOSIS — E785 Hyperlipidemia, unspecified: Secondary | ICD-10-CM

## 2020-02-14 MED ORDER — TRULICITY 0.75 MG/0.5ML ~~LOC~~ SOAJ: 0.7500 mg | SUBCUTANEOUS | 1 refills | Status: DC

## 2020-02-14 NOTE — Progress Notes (Signed)
Subjective:  Patient ID: Matthew Hutchinson, male    DOB: 12/02/1960  Age: 59 y.o. MRN: [id number]  CC:  Chief Complaint  Patient presents with  . Diabetes    pt has been taking BG fasting avg. 195 over the past month, pt notes diet is limited due to limited acess to fresh produce, pt has been eating protein bars as snacks throughout the day, pt is unable to exercise due to pain    HPI Matthew Hutchinson presents for   Diabetes: Complicated by hyperglycemia.  Last A1c 8.7 in May.  Continue Metformin 500 mg twice daily, Lantus insulin He is on ARB (hyzaar) and statin zocor '20mg'$  qd.  Was making some dietary adjustments.  Recommended that he dose of 60 units of Lantus daily.  Mealtime insulin discussed as option. Home readings:  Fasting average 198, occasional readings over 250, but not normally.  No postprandial readings.  No symptomatic lows.  Still trying to work on meals during the day, but primarily 1 large meal at night.    Microalbumin: nl ratio in November 2020.  Optho, foot exam, pneumovax: up to date.  No hx of pancreatitis, no FH of Multiple endocrine neoplasia.   Wt Readings from Last 3 Encounters:  02/14/20 (!) 362 lb (164.2 kg)  11/14/19 (!) 362 lb (164.2 kg)  08/15/19 (!) 367 lb (166.5 kg)    Lab Results  Component Value Date   HGBA1C 8.7 (H) 11/14/2019   HGBA1C 8.2 (H) 08/15/2019   HGBA1C 7.0 (A) 05/16/2019   Lab Results  Component Value Date   MICROALBUR 0.6 06/10/2016   [place] 92 08/15/2019   CREATININE 1.05 08/15/2019    Hypertension: 140/70 on average.  On hyzaar and amlodpine - did not tolerate higher dose amlodipoine d/t peripheral edema.  Rare carry out. Usually cooking at home. No added salt usually.  BP Readings from Last 3 Encounters:  02/14/20 (!) 145/84  11/14/19 131/85  08/15/19 (!) 142/84   Lab Results  Component Value Date   CREATININE 1.05 08/15/2019        History Patient Active Problem List   Diagnosis Date  Noted  . Newly diagnosed diabetes ([place]) 07/27/2018  . Morbid obesity ([place]) 02/02/2017  . Chronic pain due to trauma 06/25/2013  . Depressive disorder, not elsewhere classified 11/09/2012  . CAD (coronary artery disease) 02/21/2012  . Lipid disorder 02/21/2012  . Angina pectoris 09/27/2011  . Obstructive sleep apnea 05/20/2010  . Essential hypertension 04/17/2010  . EMPHYSEMA 04/17/2010  . WEIGHT GAIN, ABNORMAL 04/17/2010   Past Medical History:  Diagnosis Date  . Allergy   . Anxiety   . Arthritis   . COPD (chronic obstructive pulmonary disease) ([place])   . Depression   . Emphysema of lung ([place])   . Hyperlipidemia   . Morbid obesity ([place])   . OSA (obstructive sleep apnea)    uses CPAP nightly  . PONV (postoperative nausea and vomiting)   . Unspecified essential hypertension    Past Surgical History:  Procedure Laterality Date  . INSERTION OF MESH N/A 09/18/2015   Procedure: INSERTION OF MESH;  Surgeon: Coralie Keens, MD;  Location: [facility];  Service: General;  Laterality: N/A;  . LEG SURGERY Left   . SHOULDER ARTHROSCOPY Left   . TONSILLECTOMY    . UMBILICAL HERNIA REPAIR N/A 09/18/2015   Procedure: HERNIA REPAIR UMBILICAL ADULT WITH MESH ;  Surgeon: Coralie Keens, MD;  Location: [facility];  Service: General;  Laterality: N/A;   Allergies  Allergen Reactions  . Penicillins Anaphylaxis    REACTION: anaphylaxis  . Valsartan     REACTION: itch/swelling   Prior to Admission medications   Medication Sig Start Date End Date Taking? Authorizing Provider  amLODipine (NORVASC) 5 MG tablet TAKE 1 TABLET BY MOUTH EVERY DAY 11/01/19  Yes Wendie Agreste, MD  aspirin 81 MG tablet Take 1 tablet (81 mg total) by mouth daily. 11/13/12  Yes Patrecia Pour, NP  azelastine (ASTELIN) 0.1 % nasal spray  06/29/18  Yes [provider]  b complex vitamins tablet Take 1 tablet by mouth daily.   Yes [provider]  blood glucose meter kit  and supplies Dispense based on patient and insurance preference. Use 2-3 times per day. 05/09/18  Yes Wendie Agreste, MD  calcium gluconate 500 MG tablet Take 500 mg by mouth daily.   Yes [provider]  Cholecalciferol (VITAMIN D) 2000 UNITS tablet Take 1 tablet (2,000 Units total) by mouth daily. 11/13/12  Yes Patrecia Pour, NP  gabapentin (NEURONTIN) 300 MG capsule TAKE 2 CAPSULES BY MOUTH TWICE A DAY 11/01/19  Yes Wendie Agreste, MD  hydrOXYzine (VISTARIL) 50 MG capsule TAKE 2 BY MOUTH AT BEDTIME 01/15/20  Yes Arfeen, Arlyce Harman, MD  Insulin Pen Needle (PEN NEEDLES) 32G X 4 MM MISC 1 application by Does not apply route daily. 03/16/19  Yes Wendie Agreste, MD  lamoTRIgine (LAMICTAL) 150 MG tablet Take 1 tablet (150 mg total) by mouth daily. 01/15/20  Yes Arfeen, Arlyce Harman, MD  LANTUS SOLOSTAR 100 UNIT/ML Solostar Pen INJECT 50 UNITS INTO THE SKIN DAILY. PHARM REQ 30 DAY SUPPLY INSTEAD OF 90. 11/01/19  Yes Wendie Agreste, MD  losartan-hydrochlorothiazide (HYZAAR) 100-25 MG tablet TAKE 1 TABLET BY MOUTH EVERY DAY 02/14/20  Yes Wendie Agreste, MD  Lutein 40 MG CAPS Take 1 capsule by mouth daily.   Yes [provider]  magnesium gluconate (MAGONATE) 500 MG tablet Take 500 mg by mouth daily.   Yes [provider]  metFORMIN (GLUCOPHAGE) 500 MG tablet TAKE 1 TABLET (500 MG TOTAL) BY MOUTH 2 (TWO) TIMES DAILY WITH A MEAL. 11/01/19  Yes Wendie Agreste, MD  Multiple Vitamin (MULTIVITAMIN) capsule Take 1 capsule by mouth daily. 11/13/12  Yes Lord, Asa Saunas, NP  nitroGLYCERIN (NITROSTAT) 0.4 MG SL tablet Place 1 tablet (0.4 mg total) under the tongue every 5 (five) minutes as needed for chest pain. If chest pain not resolved, after 3 doses 5 minutes apart--call 911 11/13/12  Yes Lord, Asa Saunas, NP  PLENVU 140 g SOLR See admin instructions. 07/21/18  Yes [provider]  Potassium 99 MG TABS Take 1 tablet by mouth daily.   Yes [provider]  sertraline (ZOLOFT) 100  MG tablet TAKE 1 TABLET BY MOUTH DAILY 01/15/20  Yes Arfeen, Arlyce Harman, MD  Shark Cartilage 740 MG CAPS Take 1 capsule by mouth 3 (three) times daily.   Yes [provider]  simvastatin (ZOCOR) 20 MG tablet TAKE 1 TABLET BY MOUTH AT BEDTIME, 02/12/20  Yes Wendie Agreste, MD  traMADol (ULTRAM) 50 MG tablet Take 50 mg by mouth every 6 (six) hours as needed for pain.   Yes [provider]  traZODone (DESYREL) 100 MG tablet TAKE 1 BY MOUTH AT BEDTIME 10/16/19  Yes Arfeen, Arlyce Harman, MD   Social History   Socioeconomic History  . Marital status: Married    Spouse name:  Not on file  . Number of children: 0  . Years of education: Not on file  . Highest education level: Not on file  Occupational History  . 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 Smoking status: Former Smoker    Packs/day: 2.00    Years: 26.00    Pack years: 52.00    Types: Cigarettes    Quit date: 08/13/2003    Years since quitting: 16.5  . Smokeless tobacco: Never Used  . Tobacco comment: 2ppd x 26 years  Vaping Use  . Vaping Use: Never used  Substance and Sexual Activity  . Alcohol use: No    Alcohol/week: 0.0 standard drinks    Comment: social  . Drug use: No  . Sexual activity: Yes    Partners: Male    Birth control/protection: None  Other Topics Concern  . Not on file  Social History Narrative   Married. Education: college. Exercise: No.   Social Determinants of Health   Financial Resource Strain:   . Difficulty of Paying Living Expenses:   Food Insecurity:   . Worried About Charity fundraiser in the Last Year:   . Arboriculturist in the Last Year:   Transportation Needs:   . Film/video editor (Medical):   Marland Kitchen Lack of Transportation (Non-Medical):   Physical Activity:   . Days of Exercise per Week:   . Minutes of Exercise per Session:   Stress:   . Feeling of Stress :   Social Connections:   . Frequency of Communication with Friends and Family:   . Frequency of Social Gatherings  with Friends and Family:   . Attends Religious Services:   . Active Member of Clubs or Organizations:   . Attends Archivist Meetings:   Marland Kitchen Marital Status:   Intimate Partner Violence:   . Fear of Current or Ex-Partner:   . Emotionally Abused:   Marland Kitchen Physically Abused:   . Sexually Abused:     Review of Systems  Constitutional: Negative for fatigue and unexpected weight change.  Eyes: Negative for visual disturbance.  Respiratory: Negative for cough, chest tightness and shortness of breath.   Cardiovascular: Negative for chest pain, palpitations and leg swelling.  Gastrointestinal: Negative for abdominal pain and blood in stool.  Neurological: Negative for dizziness, light-headedness and headaches.     Objective:   Vitals:   02/14/20 1140 02/14/20 1154  BP: (!) 177/83 (!) 145/84  Pulse: 91   Resp: 16   Temp: 97.9 F (36.6 C)   TempSrc: Temporal   SpO2: 98%   Weight: (!) 362 lb (164.2 kg)   Height: '5\' 11"'$  (1.803 m)      Physical Exam Vitals reviewed.  Constitutional:      Appearance: He is well-developed.  HENT:     Head: Normocephalic and atraumatic.  Eyes:     Pupils: Pupils are equal, round, and reactive to light.  Neck:     Vascular: No carotid bruit or JVD.  Cardiovascular:     Rate and Rhythm: Normal rate and regular rhythm.     Heart sounds: Normal heart sounds. No murmur heard.   Pulmonary:     Effort: Pulmonary effort is normal.     Breath sounds: Normal breath sounds. No rales.  Skin:    General: Skin is warm and dry.  Neurological:     Mental Status: He is alert and oriented to person, place, and time.      Assessment &  Plan:  Matthew Hutchinson is a 59 y.o. male . Type 2 diabetes mellitus with hyperglycemia, with long-term current use of insulin (HCC) - Plan: Dulaglutide (TRULICITY) 8.34 FH/8.3EX SOPN, Hemoglobin A1c  Hyperlipidemia, unspecified hyperlipidemia type - Plan: Lipid panel  Essential hypertension - Plan:  Comprehensive metabolic panel  BMI 46.0-02.9, adult ([place])  Commended on continued approach to work on spreading calories throughout the day.  Still largest meal in the evening.  Persistent hyperglycemia on home readings.  Increase Lantus by 2 units, add Trulicity, dosing discussed and demonstration device used.  Understanding expressed.  Recheck 3 months.  No other med changes at this time.  If persistent elevated blood pressures, will need further adjustments, RTC precautions.  Meds ordered this encounter  Medications  . Dulaglutide (TRULICITY) 8.47 JG/8.5UD SOPN    Sig: Inject 0.5 mLs (0.75 mg total) into the skin once a week.    Dispense:  6 mL    Refill:  1   Patient Instructions    If blood pressures remain in the 140 range let me know and I can look at additional med for blood pressure.  Increase lantus to 62 units, continue metformin. Add trulicity once per week. If any new side effects, let me know. recheck in 6 weeks with home readings.   Thanks for coming in today.   If you have lab work done today you will be contacted with your lab results within the next 2 weeks.  If you have not heard from [place] then please contact us. The fastest way to get your results is to register for My Chart.   IF you received an x-ray today, you will receive an invoice from [facility]. Please contact [facility] at [phone] with questions or concerns regarding your invoice.   IF you received labwork today, you will receive an invoice from [place]. Please contact [facility] at [phone] with questions or concerns regarding your invoice.   Our billing staff will not be able to assist you with questions regarding bills from these companies.  You will be contacted with the lab results as soon as they are available. The fastest way to get your results is to activate your My Chart account. Instructions are located on the last page of this paperwork. If you have not heard from [place]  regarding the results in 2 weeks, please contact this office.         Signed, Merri Ray, MD [facility]

## 2020-02-14 NOTE — Patient Instructions (Addendum)
  If blood pressures remain in the 140 range let me know and I can look at additional med for blood pressure.  Increase lantus to 62 units, continue metformin. Add trulicity once per week. If any new side effects, let me know. recheck in 6 weeks with home readings.   Thanks for coming in today.   If you have lab work done today you will be contacted with your lab results within the next 2 weeks.  If you have not heard from [place] then please contact us. The fastest way to get your results is to register for My Chart.   IF you received an x-ray today, you will receive an invoice from [facility]. Please contact [facility] at [phone] with questions or concerns regarding your invoice.   IF you received labwork today, you will receive an invoice from [place]. Please contact [facility] at [phone] with questions or concerns regarding your invoice.   Our billing staff will not be able to assist you with questions regarding bills from these companies.  You will be contacted with the lab results as soon as they are available. The fastest way to get your results is to activate your My Chart account. Instructions are located on the last page of this paperwork. If you have not heard from [place] regarding the results in 2 weeks, please contact this office.

## 2020-02-15 LAB — COMPREHENSIVE METABOLIC PANEL
ALT: 23 IU/L (ref 0–44)
AST: 22 IU/L (ref 0–40)
Albumin/Globulin Ratio: 1.8 (ref 1.2–2.2)
Albumin: 4.4 g/dL (ref 3.8–4.9)
Alkaline Phosphatase: 91 IU/L (ref 48–121)
BUN/Creatinine Ratio: 10 (ref 9–20)
BUN: 9 mg/dL (ref 6–24)
Bilirubin Total: 0.5 mg/dL (ref 0.0–1.2)
CO2: 24 mmol/L (ref 20–29)
Calcium: 9.2 mg/dL (ref 8.7–10.2)
Chloride: 102 mmol/L (ref 96–106)
Creatinine, Ser: 0.86 mg/dL (ref 0.76–1.27)
GFR calc Af Amer: 110 mL/min/{1.73_m2} (ref >59)
GFR calc non Af Amer: 95 mL/min/{1.73_m2} (ref >59)
Globulin, Total: 2.5 g/dL (ref 1.5–4.5)
Glucose: 197 mg/dL — ABNORMAL HIGH (ref 65–99)
Potassium: 4.1 mmol/L (ref 3.5–5.2)
Sodium: 139 mmol/L (ref 134–144)
Total Protein: 6.9 g/dL (ref 6.0–8.5)

## 2020-02-15 LAB — LIPID PANEL
Chol/HDL Ratio: 3.7 ratio (ref 0.0–5.0)
Cholesterol, Total: 140 mg/dL (ref 100–199)
HDL: 38 mg/dL — ABNORMAL LOW (ref >39)
LDL Chol Calc (NIH): 81 mg/dL (ref 0–99)
Triglycerides: 114 mg/dL (ref 0–149)
VLDL Cholesterol Cal: 21 mg/dL (ref 5–40)

## 2020-02-15 LAB — HEMOGLOBIN A1C
Est. average glucose Bld gHb Est-mCnc: 194 mg/dL
Hgb A1c MFr Bld: 8.4 % — ABNORMAL HIGH (ref 4.8–5.6)

## 2020-02-26 DIAGNOSIS — G4733 Obstructive sleep apnea (adult) (pediatric): Secondary | ICD-10-CM | POA: Diagnosis not present

## 2020-03-06 ENCOUNTER — Other Ambulatory Visit (HOSPITAL_COMMUNITY): Payer: Self-pay | Admitting: Psychiatry

## 2020-03-06 DIAGNOSIS — M79641 Pain in right hand: Secondary | ICD-10-CM | POA: Diagnosis not present

## 2020-03-06 DIAGNOSIS — F330 Major depressive disorder, recurrent, mild: Secondary | ICD-10-CM

## 2020-03-06 DIAGNOSIS — M65331 Trigger finger, right middle finger: Secondary | ICD-10-CM | POA: Diagnosis not present

## 2020-03-26 ENCOUNTER — Encounter: Payer: Self-pay | Admitting: Internal Medicine

## 2020-03-27 ENCOUNTER — Other Ambulatory Visit: Payer: Self-pay

## 2020-03-27 ENCOUNTER — Encounter: Payer: Self-pay | Admitting: Internal Medicine

## 2020-03-27 ENCOUNTER — Ambulatory Visit: Payer: BC Managed Care – PPO | Admitting: Internal Medicine

## 2020-03-27 VITALS — BP 126/72 | HR 78 | Temp 96.8°F | Ht 74.0 in | Wt 356.4 lb

## 2020-03-27 DIAGNOSIS — R635 Abnormal weight gain: Secondary | ICD-10-CM

## 2020-03-27 DIAGNOSIS — E118 Type 2 diabetes mellitus with unspecified complications: Secondary | ICD-10-CM | POA: Diagnosis not present

## 2020-03-27 DIAGNOSIS — Z794 Long term (current) use of insulin: Secondary | ICD-10-CM | POA: Diagnosis not present

## 2020-03-27 DIAGNOSIS — G4733 Obstructive sleep apnea (adult) (pediatric): Secondary | ICD-10-CM | POA: Diagnosis not present

## 2020-03-27 DIAGNOSIS — J449 Chronic obstructive pulmonary disease, unspecified: Secondary | ICD-10-CM | POA: Diagnosis not present

## 2020-03-27 DIAGNOSIS — J438 Other emphysema: Secondary | ICD-10-CM

## 2020-03-27 MED ORDER — STIOLTO RESPIMAT 2.5-2.5 MCG/ACT IN AERS: 2.0000 | INHALATION_SPRAY | Freq: Every day | RESPIRATORY_TRACT | 0 refills | Status: DC

## 2020-03-27 NOTE — Progress Notes (Signed)
HPI M former smoker followed for OSA, complicated by Morbid Obesity, HBP, CAD, COPD, DM2, Depression, Chronic Pain NPSG 06/08/09 AHI 14.8/ hr, desaturation to 83%, body weight 298 lbs Spirometry 10/16/12- moderate restriction with mild obstruction likely --------------------------------------------------------------------------------  02/14/2019- 58 yoM former smoker with hx of OSA, seeking to re-establish and needing to replace old, malfunctioning CPAP machine.  Medical problem list includes Morbid Obesity, HBP, CAD, COPD, DM2, Depression, Chronic Pain, Asthmatic Bronchitis,  NPSG 06/08/09 AHI 14.8/ hr, desaturation to 83%, body weight 298 lbs He has been fully compliant with CPAP. Machine is older than 5 years. There is a whistling airleak related to the humidifier.  Uses nasal pillows.  Sleeps much better with CPAP, noting that his uvula begins to swell if he tries to sleep without it.  ENT surgery+ tonsils.  Body weight today 360 lbs Works daytime job as Therapist, occupational job.  Had worked in past with Dr Shelle Iron- CPAP 8.  03/27/20- 70 yoM former smoker followed for OSA, complicated by Morbid Obesity, HBP, CAD, COPD, DM2, Depression, Chronic Pain CPAP  auto 5-20   / [facility] [employer] Auto  Download- compliance 100%, AHI 2.2/ hr Body weight today- 336 lbs Covid vax- 2 Phizer -----1 year follow up---using the cpap with no issues.  does need refills of cpap supplies Pending flu vax tomorrow and plans Covid booster Doing well with CPAP. Breathing stable, using singulair, Arnuity No recent exacerbation. Pending f/u echo for known murmur.  //Consider full PFT for documentation//  ROS-see HPI   + = positive Constitutional:    weight loss, night sweats, fevers, chills, fatigue, lassitude. HEENT:    +headaches, difficulty swallowing, tooth/dental problems, sore throat,       sneezing, itching, ear ache, nasal congestion, post nasal drip, snoring CV:    +chest pain, orthopnea, PND, swelling  in lower extremities, anasarca,                                  dizziness, palpitations Resp:  + shortness of breath with exertion or at rest.                productive cough,   +non-productive cough, coughing up of blood.              change in color of mucus.  wheezing.   Skin:    rash or lesions. GI:  No-   heartburn, indigestion, abdominal pain, nausea, vomiting, diarrhea,                 change in bowel habits, loss of appetite GU: dysuria, change in color of urine, no urgency or frequency.   flank pain. MS:   +joint pain, stiffness, decreased range of motion, back pain. Neuro-     nothing unusual Psych:  change in mood or affect.  +depression or anxiety.   memory loss.  OBJ- Physical Exam, + morbidly obese General- Alert, Oriented, Affect-appropriate, Distress- none acute Skin- rash-none, lesions- none, excoriation- none Lymphadenopathy- none Head- atraumatic            Eyes- Gross vision intact, PERRLA, conjunctivae and secretions clear            Ears- Hearing, canals-normal            Nose- Clear, no-Septal dev, mucus, polyps, erosion, perforation             Throat- Mallampati IV , mucosa clear , drainage- none, tonsils- atrophic  Neck- flexible , trachea midline, no stridor , thyroid nl, carotid no bruit Chest - symmetrical excursion , unlabored           Heart/CV- RRR ,  murmur + 1-2/6 S, no gallop  , no rub, nl s1 s2                           - JVD- none , edema+1, stasis changes- none, varices- none           Lung- clear to P&A, wheeze- none, cough- none , dullness-none, rub- none           Chest wall-  Abd-  Br/ Gen/ Rectal- Not done, not indicated Extrem- cyanosis- none, clubbing, none, atrophy- none, strength- nl Neuro- grossly intact to observation

## 2020-03-27 NOTE — Patient Instructions (Signed)
Order- DME [facility]-  Continue CPAP current settings, please install AirView/ card, update supplies, humidifier  Sample Stiolto 2.5 inhaler    Inhale 2 puffs, once daily    See if this helps your breathing  Please call if we can help

## 2020-03-28 ENCOUNTER — Ambulatory Visit: Payer: BC Managed Care – PPO | Admitting: Family Medicine

## 2020-03-28 ENCOUNTER — Encounter: Payer: Self-pay | Admitting: Family Medicine

## 2020-03-28 ENCOUNTER — Encounter: Payer: Self-pay | Admitting: Internal Medicine

## 2020-03-28 VITALS — BP 132/78 | HR 88 | Temp 98.0°F | Ht 74.0 in | Wt 256.0 lb

## 2020-03-28 DIAGNOSIS — E1165 Type 2 diabetes mellitus with hyperglycemia: Secondary | ICD-10-CM | POA: Diagnosis not present

## 2020-03-28 DIAGNOSIS — Z23 Encounter for immunization: Secondary | ICD-10-CM

## 2020-03-28 DIAGNOSIS — G4733 Obstructive sleep apnea (adult) (pediatric): Secondary | ICD-10-CM | POA: Diagnosis not present

## 2020-03-28 DIAGNOSIS — Z794 Long term (current) use of insulin: Secondary | ICD-10-CM

## 2020-03-28 NOTE — Patient Instructions (Addendum)
°  Depending on fructosamine result, I do think you have some opportunity to increase your Trulicity.  I will let you know once I receive those results, no change in meds for now.  Follow-up for repeat A1c in 2 months.   If you have lab work done today you will be contacted with your lab results within the next 2 weeks.  If you have not heard from [place] then please contact us. The fastest way to get your results is to register for My Chart.   IF you received an x-ray today, you will receive an invoice from [facility]. Please contact [facility] at [phone] with questions or concerns regarding your invoice.   IF you received labwork today, you will receive an invoice from [place]. Please contact [facility] at [phone] with questions or concerns regarding your invoice.   Our billing staff will not be able to assist you with questions regarding bills from these companies.  You will be contacted with the lab results as soon as they are available. The fastest way to get your results is to activate your My Chart account. Instructions are located on the last page of this paperwork. If you have not heard from [place] regarding the results in 2 weeks, please contact this office.

## 2020-03-28 NOTE — Assessment & Plan Note (Signed)
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 He finds singulair and Arnuity helpful when needed, infrequent Plan refilling singulair

## 2020-03-28 NOTE — Assessment & Plan Note (Signed)
He understands weight is important Consider Healthy Weight and Wellness

## 2020-03-28 NOTE — Assessment & Plan Note (Signed)
Benefits from CPAP with good compliance and control Plan- continue auto 5-20 

## 2020-03-28 NOTE — Progress Notes (Signed)
Subjective:  Patient ID: Matthew Hutchinson, male    DOB: 02/23/1961  Age: 59 y.o. MRN: [id number]  CC:  Chief Complaint  Patient presents with  . Follow-up    on diabetes. Pt reports no issues with diabetes since last OV. PT states he has been checking his BS once a day fasting before medication. 120-141m/dl is the rang that the pt reports getting since last OV.    HPI Matthew Hutchinson presents for   Diabetes: Complicated by hyperglycemia. A1c still elevated 8.4 last visit. Metformin 500 mg twice daily, Lantus insulin Increase by 2 units last visit. Now at 62 units. Added Trulicity 05.32mg weekly. Working ok. Slight decr appetite.  Home readings 120-150 average. 150-160.  No symptomatic lows. Lowest 112.   Saw pulmonary yesterday. Trying inhaler for emphysema, continued on CPAP.   Lab Results  Component Value Date   HGBA1C 8.4 (H) 02/14/2020   HGBA1C 8.7 (H) 11/14/2019   HGBA1C 8.2 (H) 08/15/2019   Lab Results  Component Value Date   MICROALBUR 0.6 06/10/2016   LDLCALC 81 02/14/2020   CREATININE 0.86 02/14/2020     History Patient Active Problem List   Diagnosis Date Noted  . Newly diagnosed diabetes ([facility] 07/27/2018  . Morbid obesity ([facility] 02/02/2017  . Chronic pain due to trauma 06/25/2013  . Depressive disorder, not elsewhere classified 11/09/2012  . CAD (coronary artery disease) 02/21/2012  . Lipid disorder 02/21/2012  . Angina pectoris 09/27/2011  . Obstructive sleep apnea 05/20/2010  . Essential hypertension 04/17/2010  . EMPHYSEMA 04/17/2010  . WEIGHT GAIN, ABNORMAL 04/17/2010   Past Medical History:  Diagnosis Date  . Allergy   . Anxiety   . Arthritis   . COPD (chronic obstructive pulmonary disease) ([facility]   . Depression   . Emphysema of lung ([facility]   . Hyperlipidemia   . Morbid obesity ([facility]   . OSA (obstructive sleep apnea)    uses CPAP nightly  . PONV (postoperative nausea and vomiting)   . Unspecified essential hypertension     Past Surgical History:  Procedure Laterality Date  . INSERTION OF MESH N/A 09/18/2015   Procedure: INSERTION OF MESH;  Surgeon: DCoralie Keens MD;  [facility]  Service: General;  Laterality: N/A;  . LEG SURGERY Left   . SHOULDER ARTHROSCOPY Left   . TONSILLECTOMY    . UMBILICAL HERNIA REPAIR N/A 09/18/2015   Procedure: HERNIA REPAIR UMBILICAL ADULT WITH MESH ;  Surgeon: DCoralie Keens MD;  [facility]  Service: General;  Laterality: N/A;   Allergies  Allergen Reactions  . Penicillins Anaphylaxis    REACTION: anaphylaxis  . Valsartan     REACTION: itch/swelling   Prior to Admission medications   Medication Sig Start Date End Date Taking? Authorizing Provider  amLODipine (NORVASC) 5 MG tablet TAKE 1 TABLET BY MOUTH EVERY DAY 11/01/19  Yes GWendie Agreste MD  aspirin 81 MG tablet Take 1 tablet (81 mg total) by mouth daily. 11/13/12  Yes LPatrecia Pour NP  azelastine (ASTELIN) 0.1 % nasal spray  06/29/18  Yes [provider]  b complex vitamins tablet Take 1 tablet by mouth daily.   Yes [provider]  blood glucose meter kit and supplies Dispense based on patient and insurance preference. Use 2-3 times per day. 05/09/18  Yes GWendie Agreste MD  calcium gluconate 500 MG tablet Take 500 mg by mouth daily.   Yes [provider]  Cholecalciferol (  VITAMIN D) 2000 UNITS tablet Take 1 tablet (2,000 Units total) by mouth daily. 11/13/12  Yes Lord, Asa Saunas, NP  Dulaglutide (TRULICITY) 2.70 JJ/0.0XF SOPN Inject 0.5 mLs (0.75 mg total) into the skin once a week. 02/14/20  Yes Wendie Agreste, MD  gabapentin (NEURONTIN) 300 MG capsule TAKE 2 CAPSULES BY MOUTH TWICE A DAY 11/01/19  Yes Wendie Agreste, MD  hydrOXYzine (VISTARIL) 50 MG capsule TAKE 2 BY MOUTH AT BEDTIME 01/15/20  Yes Arfeen, Arlyce Harman, MD  Insulin Pen Needle (PEN NEEDLES) 32G X 4 MM MISC 1 application by Does not apply route daily. 03/16/19  Yes Wendie Agreste, MD  lamoTRIgine (LAMICTAL) 150 MG tablet Take 1 tablet (150 mg total) by mouth daily. 01/15/20  Yes Arfeen, Arlyce Harman, MD  LANTUS SOLOSTAR 100 UNIT/ML Solostar Pen INJECT 50 UNITS INTO THE SKIN DAILY. PHARM REQ 30 DAY SUPPLY INSTEAD OF 90. 11/01/19  Yes Wendie Agreste, MD  losartan-hydrochlorothiazide (HYZAAR) 100-25 MG tablet TAKE 1 TABLET BY MOUTH EVERY DAY 02/14/20  Yes Wendie Agreste, MD  Lutein 40 MG CAPS Take 1 capsule by mouth daily.   Yes [provider]  magnesium gluconate (MAGONATE) 500 MG tablet Take 500 mg by mouth daily.   Yes [provider]  metFORMIN (GLUCOPHAGE) 500 MG tablet TAKE 1 TABLET (500 MG TOTAL) BY MOUTH 2 (TWO) TIMES DAILY WITH A MEAL. 11/01/19  Yes Wendie Agreste, MD  Multiple Vitamin (MULTIVITAMIN) capsule Take 1 capsule by mouth daily. 11/13/12  Yes Lord, Asa Saunas, NP  nitroGLYCERIN (NITROSTAT) 0.4 MG SL tablet Place 1 tablet (0.4 mg total) under the tongue every 5 (five) minutes as needed for chest pain. If chest pain not resolved, after 3 doses 5 minutes apart--call 911 11/13/12  Yes Lord, Asa Saunas, NP  PLENVU 140 g SOLR See admin instructions. 07/21/18  Yes [provider]  Potassium 99 MG TABS Take 1 tablet by mouth daily.   Yes [provider]  sertraline (ZOLOFT) 100 MG tablet TAKE 1 TABLET BY MOUTH DAILY 01/15/20  Yes Arfeen, Arlyce Harman, MD  Shark Cartilage 740 MG CAPS Take 1 capsule by mouth 3 (three) times daily.   Yes [provider]  simvastatin (ZOCOR) 20 MG tablet TAKE 1 TABLET BY MOUTH AT BEDTIME, 02/12/20  Yes Wendie Agreste, MD  Tiotropium Bromide-Olodaterol (STIOLTO RESPIMAT) 2.5-2.5 MCG/ACT AERS Inhale 2 puffs into the lungs daily. 03/27/20  Yes Young, Tarri Fuller D, MD  traMADol (ULTRAM) 50 MG tablet Take 50 mg by mouth every 6 (six) hours as needed for pain.   Yes [provider]  traZODone (DESYREL) 100 MG tablet TAKE 1 BY MOUTH AT BEDTIME 10/16/19  Yes Arfeen, Arlyce Harman, MD   Social History    Socioeconomic History  . Marital status: Married    Spouse name: Not on file  . Number of children: 0  . Years of education: Not on file  . Highest education level: Not on file  Occupational History  . 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 Smoking status: Former Smoker    Packs/day: 2.00    Years: 26.00    Pack years: 52.00    Types: Cigarettes    Quit date: 08/13/2003    Years since quitting: 16.6  . Smokeless tobacco: Never Used  . Tobacco comment: 2ppd x 26 years  Vaping Use  . Vaping Use: Never used  Substance and Sexual Activity  . Alcohol use: No    Alcohol/week: 0.0  standard drinks    Comment: social  . Drug use: No  . Sexual activity: Yes    Partners: Male    Birth control/protection: None  Other Topics Concern  . Not on file  Social History Narrative   Married. Education: college. Exercise: No.   Social Determinants of Health   Financial Resource Strain:   . Difficulty of Paying Living Expenses: Not on file  Food Insecurity:   . Worried About Charity fundraiser in the Last Year: Not on file  . Ran Out of Food in the Last Year: Not on file  Transportation Needs:   . Lack of Transportation (Medical): Not on file  . Lack of Transportation (Non-Medical): Not on file  Physical Activity:   . Days of Exercise per Week: Not on file  . Minutes of Exercise per Session: Not on file  Stress:   . Feeling of Stress : Not on file  Social Connections:   . Frequency of Communication with Friends and Family: Not on file  . Frequency of Social Gatherings with Friends and Family: Not on file  . Attends Religious Services: Not on file  . Active Member of Clubs or Organizations: Not on file  . Attends Archivist Meetings: Not on file  . Marital Status: Not on file  Intimate Partner Violence:   . Fear of Current or Ex-Partner: Not on file  . Emotionally Abused: Not on file  . Physically Abused: Not on file  . Sexually Abused: Not on file    Review of  Systems  Per hpi  Objective:   Vitals:   03/28/20 0848 03/28/20 0853  BP: (!) 150/81 132/78  Pulse: 88   Temp: 98 F (36.7 C)   TempSrc: Temporal   SpO2: 91%   Weight: 256 lb (116.1 kg)   Height: _0  (1.88 m)      Physical Exam Vitals reviewed.  Constitutional:      Appearance: He is well-developed. He is obese.  HENT:     Head: Normocephalic and atraumatic.  Eyes:     Pupils: Pupils are equal, round, and reactive to light.  Neck:     Vascular: No JVD.  Cardiovascular:     Rate and Rhythm: Normal rate and regular rhythm.     Heart sounds: Normal heart sounds. No murmur heard.   Pulmonary:     Effort: Pulmonary effort is normal.     Breath sounds: Normal breath sounds. No rales.  Skin:    General: Skin is warm and dry.  Neurological:     Mental Status: He is alert and oriented to person, place, and time.  Psychiatric:        Mood and Affect: Mood normal.        Behavior: Behavior normal.       Assessment & Plan:  Matthew Hutchinson is a 59 y.o. male . Type 2 diabetes mellitus with hyperglycemia, with long-term current use of insulin (HCC) - Plan: Fructosamine  -Improving readings.  Check fructosamine, option of increasing Trulicity dose.  No med changes for now, 7-monthfollow-up for A1c.  Need for prophylactic vaccination and inoculation against influenza - Plan: Flu Vaccine QUAD 36+ mos IM   No orders of the defined types were placed in this encounter.  Patient Instructions    Depending on fructosamine result, I do think you have some opportunity to increase your Trulicity.  I will let you know once I receive those results, no change in meds  for now.  Follow-up for repeat A1c in 2 months.   If you have lab work done today you will be contacted with your lab results within the next 2 weeks.  If you have not heard from [place] then please contact us. The fastest way to get your results is to register for My Chart.   IF you received an x-ray today,  you will receive an invoice from [facility]. Please contact [facility] at [phone] with questions or concerns regarding your invoice.   IF you received labwork today, you will receive an invoice from [place] [facility]. Please contact [facility] at [phone] with questions or concerns regarding your invoice.   Our billing staff will not be able to assist you with questions regarding bills from these companies.  You will be contacted with the lab results as soon as they are available. The fastest way to get your results is to activate your My Chart account. Instructions are located on the last page of this paperwork. If you have not heard from [place] regarding the results in 2 weeks, please contact this office.         Signed, Merri Ray, MD [facility]

## 2020-03-29 LAB — FRUCTOSAMINE: Fructosamine: 261 umol/L (ref 0–285)

## 2020-04-05 ENCOUNTER — Other Ambulatory Visit (HOSPITAL_COMMUNITY): Payer: Self-pay | Admitting: Psychiatry

## 2020-04-05 DIAGNOSIS — F330 Major depressive disorder, recurrent, mild: Secondary | ICD-10-CM

## 2020-04-07 ENCOUNTER — Other Ambulatory Visit: Payer: Self-pay | Admitting: Family Medicine

## 2020-04-07 DIAGNOSIS — E1165 Type 2 diabetes mellitus with hyperglycemia: Secondary | ICD-10-CM

## 2020-04-12 ENCOUNTER — Encounter: Payer: Self-pay | Admitting: Family Medicine

## 2020-04-12 DIAGNOSIS — E1165 Type 2 diabetes mellitus with hyperglycemia: Secondary | ICD-10-CM

## 2020-04-14 MED ORDER — TECHLITE PEN NEEDLES 32G X 4 MM MISC: 3 refills | Status: DC

## 2020-04-14 NOTE — Telephone Encounter (Signed)
Dr. Maple Hudson please advise on patient's email  Dr. Maple Hudson, I just found out there was a recall on my [facility].  Verified through [place] via serial number that my machine is affected. They urge to discontinue use immediately but it will an unspecified "longer than normal" amount of time before I will receive a replacement.  They said I should ask you if it will be safe to continue using until I get the replacement. Thank you, Greggory Stallion

## 2020-04-14 NOTE — Telephone Encounter (Signed)
[place] Pt Pt needs covered pen needles states current are not pharmacy advised different brand  ARKRAY TechLITE PEN NEEDLES [id number] Searched pt history as he says he has used these previously and found no record, tried ordering them and they were not found I am hoping you have better luck with this if not I will call pharmacy and ask for different covered alternative.

## 2020-04-14 NOTE — Telephone Encounter (Signed)
Sorry- I must have missed Matthew Hutchinson's original email. Can he check with his DME and his insurance to see what they say about replacing his current machine. It will depend how old it is.  The easiest fix would be if they are willing to replace with another brand (ResMed), instead of waiting for [employer].

## 2020-04-14 NOTE — Telephone Encounter (Signed)
Please let patient that order sent. thanks

## 2020-04-15 ENCOUNTER — Other Ambulatory Visit: Payer: Self-pay

## 2020-04-15 ENCOUNTER — Telehealth (INDEPENDENT_AMBULATORY_CARE_PROVIDER_SITE_OTHER): Payer: BC Managed Care – PPO | Admitting: Psychiatry

## 2020-04-15 ENCOUNTER — Encounter (HOSPITAL_COMMUNITY): Payer: Self-pay | Admitting: Psychiatry

## 2020-04-15 DIAGNOSIS — F411 Generalized anxiety disorder: Secondary | ICD-10-CM | POA: Diagnosis not present

## 2020-04-15 DIAGNOSIS — F330 Major depressive disorder, recurrent, mild: Secondary | ICD-10-CM

## 2020-04-15 MED ORDER — SERTRALINE HCL 100 MG PO TABS: ORAL_TABLET | ORAL | 0 refills | Status: DC

## 2020-04-15 MED ORDER — TRAZODONE HCL 100 MG PO TABS: ORAL_TABLET | ORAL | 0 refills | Status: DC

## 2020-04-15 NOTE — Progress Notes (Signed)
Virtual Visit via Telephone Note  I connected with Matthew Hutchinson on 04/15/20 at  3:00 PM EDT by telephone and verified that I am speaking with the correct person using two identifiers.  Location: Patient: home Provider: home office   I discussed the limitations, risks, security and privacy concerns of performing an evaluation and management service by telephone and the availability of in person appointments. I also discussed with the patient that there may be a patient responsible charge related to this service. The patient expressed understanding and agreed to proceed.   History of Present Illness: Patient is evaluated by phone session.  On the last visit we increased Lamictal and he is now taking Lamictal 150 mg every day.  He noticed improvement in his mood irritability.  He is happy that business picked up and he is happy with his work.  His physician started him on Trulicity once a week and that helped some weight loss and better control of his blood sugar.  His last hemoglobin A1c is 8.4.  Patient denies any anger, agitation, severe mood swings but still gets sometimes frustrated and irritable.  But overall he feels the medicine helping his mood.  He does not take trazodone every night.  He has no rash, itching, tremors or shakes.  He is using CPAP which helps his sleep.  He denies paranoia, suicidal thoughts or any crying spells.  His energy level is improved since he lost some weight.  Past Psychiatric History:Reviewed. [facility]. H/Ooverdose on Ambien 2 other times but did not require inpatient treatment. History of mood swing, impulsive behavior, speeding ticket, anger, road rage. No history of psychosis or hallucination.  Recent Results (from the past 2160 hour(s))  Comprehensive metabolic panel     Status: Abnormal   Collection Time: 02/14/20  3:21 PM  Result Value Ref Range   Glucose 197 (H) 65 - 99 mg/dL   BUN 9 6 - 24 mg/dL    Creatinine, Ser 0.86 0.76 - 1.27 mg/dL   GFR calc non Af Amer 95 >59 mL/min/1.73   GFR calc Af Amer 110 >59 mL/min/1.73    Comment: **[facility] currently reports eGFR in compliance with the current**   recommendations of the [employer]. [facility] will   update reporting as new guidelines are published from the NKF-ASN   Task force.    BUN/Creatinine Ratio 10 9 - 20   Sodium 139 134 - 144 mmol/L   Potassium 4.1 3.5 - 5.2 mmol/L   Chloride 102 96 - 106 mmol/L   CO2 24 20 - 29 mmol/L   Calcium 9.2 8.7 - 10.2 mg/dL   Total Protein 6.9 6.0 - 8.5 g/dL   Albumin 4.4 3.8 - 4.9 g/dL   Globulin, Total 2.5 1.5 - 4.5 g/dL   Albumin/Globulin Ratio 1.8 1.2 - 2.2   Bilirubin Total 0.5 0.0 - 1.2 mg/dL   Alkaline Phosphatase 91 48 - 121 IU/L   AST 22 0 - 40 IU/L   ALT 23 0 - 44 IU/L  Lipid panel     Status: Abnormal   Collection Time: 02/14/20  3:21 PM  Result Value Ref Range   Cholesterol, Total 140 100 - 199 mg/dL   Triglycerides 114 0 - 149 mg/dL   HDL 38 (L) >39 mg/dL   VLDL Cholesterol Cal 21 5 - 40 mg/dL   LDL Chol Calc (NIH) 81 0 - 99 mg/dL   Chol/HDL Ratio 3.7 0.0 - 5.0 ratio    Comment:  T. Chol/HDL Ratio                                             Men  Women                               1/2 Avg.Risk  3.4    3.3                                   Avg.Risk  5.0    4.4                                2X Avg.Risk  9.6    7.1                                3X Avg.Risk 23.4   11.0   Hemoglobin A1c     Status: Abnormal   Collection Time: 02/14/20  3:21 PM  Result Value Ref Range   Hgb A1c MFr Bld 8.4 (H) 4.8 - 5.6 %    Comment:          Prediabetes: 5.7 - 6.4          Diabetes: >6.4          Glycemic control for adults with diabetes: <7.0    Est. average glucose Bld gHb Est-mCnc 194 mg/dL  Fructosamine     Status: None   Collection Time: 03/28/20  9:47 AM  Result Value Ref Range   Fructosamine 261 0 - 285 umol/L    Comment: Published  reference interval for apparently healthy subjects between age 62 and 24 is 20 - 285 umol/L and in a poorly controlled diabetic population is 228 - 563 umol/L with a mean of 396 umol/L.       Psychiatric Specialty Exam: Physical Exam  Review of Systems  Weight (!) 345 lb (156.5 kg).There is no height or weight on file to calculate BMI.  General Appearance: NA  Eye Contact:  NA  Speech:  Clear and Coherent and high pitch  Volume:  Normal  Mood:  Euthymic  Affect:  NA  Thought Process:  Descriptions of Associations: Intact  Orientation:  Full (Time, Place, and Person)  Thought Content:  WDL  Suicidal Thoughts:  No  Homicidal Thoughts:  No  Memory:  Immediate;   Good Recent;   Good Remote;   Good  Judgement:  Intact  Insight:  Present  Psychomotor Activity:  NA  Concentration:  Concentration: Fair and Attention Span: Fair  Recall:  Good  Fund of Knowledge:  Good  Language:  Good  Akathisia:  No  Handed:  Right  AIMS (if indicated):     Assets:  Communication Skills Desire for Improvement Housing Resilience Social Support Talents/Skills Transportation  ADL's:  Intact  Cognition:  WNL  Sleep:   ok      Assessment and Plan: Major depressive disorder, recurrent.  Generalized anxiety disorder.  Patient doing better since we increased the Lamictal 150 mg.  He does not need a new prescription of trazodone.  Continue Zoloft 100 mg daily and hydroxyzine 100 mg at bedtime.  I reviewed blood work results.  Recommended to call us back if is any question or any concern.  Follow-up in 3 months.  Follow Up Instructions:    I discussed the assessment and treatment plan with the patient. The patient was provided an opportunity to ask questions and all were answered. The patient agreed with the plan and demonstrated an understanding of the instructions.   The patient was advised to call back or seek an in-person evaluation if the symptoms worsen or if the condition fails to  improve as anticipated.  I provided 14 minutes of non-face-to-face time during this encounter.   Kathlee Nations, MD

## 2020-04-18 ENCOUNTER — Other Ambulatory Visit (HOSPITAL_COMMUNITY): Payer: Self-pay | Admitting: Psychiatry

## 2020-04-18 DIAGNOSIS — F330 Major depressive disorder, recurrent, mild: Secondary | ICD-10-CM

## 2020-04-27 DIAGNOSIS — G4733 Obstructive sleep apnea (adult) (pediatric): Secondary | ICD-10-CM | POA: Diagnosis not present

## 2020-05-02 ENCOUNTER — Other Ambulatory Visit: Payer: Self-pay

## 2020-05-02 DIAGNOSIS — Z794 Long term (current) use of insulin: Secondary | ICD-10-CM | POA: Diagnosis not present

## 2020-05-02 DIAGNOSIS — E118 Type 2 diabetes mellitus with unspecified complications: Secondary | ICD-10-CM | POA: Diagnosis not present

## 2020-05-02 DIAGNOSIS — Z9181 History of falling: Secondary | ICD-10-CM | POA: Diagnosis not present

## 2020-05-02 DIAGNOSIS — E1165 Type 2 diabetes mellitus with hyperglycemia: Secondary | ICD-10-CM

## 2020-05-02 DIAGNOSIS — R609 Edema, unspecified: Secondary | ICD-10-CM | POA: Diagnosis not present

## 2020-05-02 MED ORDER — LANTUS SOLOSTAR 100 UNIT/ML ~~LOC~~ SOPN: 50.0000 [IU] | PEN_INJECTOR | Freq: Every day | SUBCUTANEOUS | 5 refills | Status: DC

## 2020-05-12 DIAGNOSIS — H524 Presbyopia: Secondary | ICD-10-CM | POA: Diagnosis not present

## 2020-05-12 DIAGNOSIS — H5203 Hypermetropia, bilateral: Secondary | ICD-10-CM | POA: Diagnosis not present

## 2020-05-12 DIAGNOSIS — Z794 Long term (current) use of insulin: Secondary | ICD-10-CM | POA: Diagnosis not present

## 2020-05-12 DIAGNOSIS — H52203 Unspecified astigmatism, bilateral: Secondary | ICD-10-CM | POA: Diagnosis not present

## 2020-05-12 DIAGNOSIS — E119 Type 2 diabetes mellitus without complications: Secondary | ICD-10-CM | POA: Diagnosis not present

## 2020-05-12 LAB — HM DIABETES EYE EXAM

## 2020-05-13 ENCOUNTER — Other Ambulatory Visit: Payer: Self-pay | Admitting: Family Medicine

## 2020-05-13 DIAGNOSIS — E119 Type 2 diabetes mellitus without complications: Secondary | ICD-10-CM

## 2020-05-13 DIAGNOSIS — G8929 Other chronic pain: Secondary | ICD-10-CM

## 2020-05-14 ENCOUNTER — Other Ambulatory Visit: Payer: Self-pay | Admitting: Family Medicine

## 2020-05-14 DIAGNOSIS — I10 Essential (primary) hypertension: Secondary | ICD-10-CM

## 2020-05-17 DIAGNOSIS — E118 Type 2 diabetes mellitus with unspecified complications: Secondary | ICD-10-CM | POA: Diagnosis not present

## 2020-05-19 DIAGNOSIS — E118 Type 2 diabetes mellitus with unspecified complications: Secondary | ICD-10-CM | POA: Diagnosis not present

## 2020-05-21 DIAGNOSIS — G4733 Obstructive sleep apnea (adult) (pediatric): Secondary | ICD-10-CM | POA: Diagnosis not present

## 2020-05-21 DIAGNOSIS — E118 Type 2 diabetes mellitus with unspecified complications: Secondary | ICD-10-CM | POA: Diagnosis not present

## 2020-05-26 DIAGNOSIS — M65331 Trigger finger, right middle finger: Secondary | ICD-10-CM | POA: Diagnosis not present

## 2020-05-28 ENCOUNTER — Encounter: Payer: Self-pay | Admitting: Family Medicine

## 2020-05-28 ENCOUNTER — Ambulatory Visit (INDEPENDENT_AMBULATORY_CARE_PROVIDER_SITE_OTHER): Payer: BC Managed Care – PPO | Admitting: Family Medicine

## 2020-05-28 ENCOUNTER — Other Ambulatory Visit: Payer: Self-pay

## 2020-05-28 VITALS — BP 141/84 | HR 88 | Temp 98.3°F | Ht 74.0 in | Wt 360.0 lb

## 2020-05-28 DIAGNOSIS — E1165 Type 2 diabetes mellitus with hyperglycemia: Secondary | ICD-10-CM

## 2020-05-28 NOTE — Patient Instructions (Addendum)
  Depending on A1c we do have the option to use a higher dose of Trulicity, tomorrow.  No med changes for now.  Thank you for coming in today.    If you have lab work done today you will be contacted with your lab results within the next 2 weeks.  If you have not heard from [place] then please contact us. The fastest way to get your results is to register for My Chart.   IF you received an x-ray today, you will receive an invoice from [facility] Rad[facility]. Please contact [facility] Radiology at [phone] with questions or concerns regarding your invoice.   IF you received labwork today, you will receive an invoice from [place]. Please contact [facility] at [phone] with questions or concerns regarding your invoice.   Our billing staff will not be able to assist you with questions regarding bills from these companies.  You will be contacted with the lab results as soon as they are available. The fastest way to get your results is to activate your My Chart account. Instructions are located on the last page of this paperwork. If you have not heard from [place] regarding the results in 2 weeks, please contact this office.

## 2020-05-28 NOTE — Progress Notes (Signed)
Subjective:  Patient ID: Matthew Hutchinson, male    DOB: 28-Mar-1961  Age: 59 y.o. MRN: [id number]  CC:  Chief Complaint  Patient presents with  . Follow-up    on diabetes. Pt stateshe was off his Trulicity medication for about 2 week. pt was onlt able to get 1/3 of the refill we sent in for the pt due to supply at the pt's pharmacy. pt stats durring his laps of medication his BS got into the 200's. pt states once he took his medication again it droped by 80 points. pt has resently had surgery on his R hand.    HPI Matthew Hutchinson presents for   Diabetes: With hyperglycemia.  Off trulicity for 2 weeks - some trouble filling at pharmacy, back on past 3 weeks. Still waiting on new rx. 0.46m per week.  Metformin 5035mbid.  lantus 62 units per day.  Hand surgery 2 days ago - blood sugar up initially after surgery to 200s, 119 this morning. 140-160 fasting usually.  No symptomatic lows.  Fructosamine normal 9/17.  No new side effects on meds. On doxycyline after hand surgery.  Microalbumin: normal 05/16/19. Unable to provide urine today.  Optho, foot exam, pneumovax: up to date.  On arb and statin.   Lab Results  Component Value Date   HGBA1C 7.4 (H) 05/28/2020   HGBA1C 8.4 (H) 02/14/2020   HGBA1C 8.7 (H) 11/14/2019   Lab Results  Component Value Date   MICROALBUR 0.6 06/10/2016   LDLCALC 81 02/14/2020   CREATININE 0.86 02/14/2020    History Patient Active Problem List   Diagnosis Date Noted  . Newly diagnosed diabetes (HCD[place]01/16/2020  . Morbid obesity (HCH[facility]07/25/2018  . Chronic pain due to trauma 06/25/2013  . Depressive disorder, not elsewhere classified 11/09/2012  . CAD (coronary artery disease) 02/21/2012  . Lipid disorder 02/21/2012  . Angina pectoris 09/27/2011  . Obstructive sleep apnea 05/20/2010  . Essential hypertension 04/17/2010  . COPD mixed type (HC[facility]10/01/2010  . WEIGHT GAIN, ABNORMAL 04/17/2010   Past Medical History:  Diagnosis Date   . Allergy   . Anxiety   . Arthritis   . COPD (chronic obstructive pulmonary disease) ([facility]  . Depression   . Emphysema of lung ([facility]ar[facility]  . Hyperlipidemia   . Morbid obesity ([facility]  . OSA (obstructive sleep apnea)    uses CPAP nightly  . PONV (postoperative nausea and vomiting)   . Unspecified essential hypertension    Past Surgical History:  Procedure Laterality Date  . INSERTION OF MESH N/A 09/18/2015   Procedure: INSERTION OF MESH;  Surgeon: DoCoralie KeensMD;  [place] Service: General;  Laterality: N/A;  . LEG SURGERY Left   . SHOULDER ARTHROSCOPY Left   . TONSILLECTOMY    . UMBILICAL HERNIA REPAIR N/A 09/18/2015   Procedure: HERNIA REPAIR UMBILICAL ADULT WITH MESH ;  Surgeon: DoCoralie KeensMD;  [place] [facility] [place] Service: General;  Laterality: N/A;   Allergies  Allergen Reactions  . Penicillins Anaphylaxis    REACTION: anaphylaxis  . Valsartan     REACTION: itch/swelling   Prior to Admission medications   Medication Sig Start Date End Date Taking? Authorizing Provider  amLODipine (NORVASC) 5 MG tablet TAKE 1 TABLET BY MOUTH EVERY DAY 11/01/19  Yes GrWendie AgresteMD  aspirin 81 MG tablet Take 1 tablet (81 mg total) by mouth daily. 11/13/12  Yes LoPatrecia PourNP  azelastine (ASTELIN) 0.1 % nasal spray  06/29/18  Yes [provider]  b complex vitamins tablet Take 1 tablet by mouth daily.   Yes [provider]  blood glucose meter kit and supplies Dispense based on patient and insurance preference. Use 2-3 times per day. 05/09/18  Yes Wendie Agreste, MD  calcium gluconate 500 MG tablet Take 500 mg by mouth daily.   Yes [provider]  Cholecalciferol (VITAMIN D) 2000 UNITS tablet Take 1 tablet (2,000 Units total) by mouth daily. 11/13/12  Yes Patrecia Pour, NP  doxycycline (VIBRAMYCIN) 100 MG capsule Take by mouth. 04/11/20  Yes [provider]  Dulaglutide (TRULICITY) 1.91  YN/8.2NF SOPN Inject 0.5 mLs (0.75 mg total) into the skin once a week. 02/14/20  Yes Wendie Agreste, MD  gabapentin (NEURONTIN) 300 MG capsule TAKE 2 CAPSULES BY MOUTH TWICE A DAY 05/13/20  Yes Wendie Agreste, MD  hydrOXYzine (VISTARIL) 50 MG capsule TAKE 2 CAPSULES BY MOUTH AT BEDTIME 04/21/20  Yes Arfeen, Arlyce Harman, MD  insulin glargine (LANTUS SOLOSTAR) 100 UNIT/ML Solostar Pen Inject 50 Units into the skin daily. Pharm req 30 day supply instead of 90. 05/02/20  Yes Wendie Agreste, MD  Insulin Pen Needle (TECHLITE PEN NEEDLES) 32G X 4 MM MISC Use once daily with lantus. Dx E11.9, Z79.4 04/14/20  Yes Rutherford Guys, MD  lamoTRIgine (LAMICTAL) 150 MG tablet TAKE 1 TABLET BY MOUTH EVERY DAY 04/21/20  Yes Arfeen, Arlyce Harman, MD  losartan-hydrochlorothiazide (HYZAAR) 100-25 MG tablet TAKE 1 TABLET BY MOUTH EVERY DAY 05/14/20  Yes Wendie Agreste, MD  Lutein 40 MG CAPS Take 1 capsule by mouth daily.   Yes [provider]  magnesium gluconate (MAGONATE) 500 MG tablet Take 500 mg by mouth daily.   Yes [provider]  metFORMIN (GLUCOPHAGE) 500 MG tablet TAKE 1 TABLET (500 MG TOTAL) BY MOUTH 2 (TWO) TIMES DAILY WITH A MEAL. 05/13/20  Yes Wendie Agreste, MD  Multiple Vitamin (MULTIVITAMIN) capsule Take 1 capsule by mouth daily. 11/13/12  Yes Lord, Asa Saunas, NP  nitroGLYCERIN (NITROSTAT) 0.4 MG SL tablet Place 1 tablet (0.4 mg total) under the tongue every 5 (five) minutes as needed for chest pain. If chest pain not resolved, after 3 doses 5 minutes apart--call 911 11/13/12  Yes Patrecia Pour, NP  oxyCODONE (OXY IR/ROXICODONE) 5 MG immediate release tablet Take by mouth. 05/26/20  Yes [provider]  PLENVU 140 g SOLR See admin instructions. 07/21/18  Yes [provider]  Potassium 99 MG TABS Take 1 tablet by mouth daily.   Yes [provider]  sertraline (ZOLOFT) 100 MG tablet TAKE 1 TABLET BY MOUTH DAILY 04/15/20  Yes Arfeen, Arlyce Harman, MD  Shark Cartilage 740  MG CAPS Take 1 capsule by mouth 3 (three) times daily.   Yes [provider]  simvastatin (ZOCOR) 20 MG tablet TAKE 1 TABLET BY MOUTH AT BEDTIME, 02/12/20  Yes Wendie Agreste, MD  Tiotropium Bromide-Olodaterol (STIOLTO RESPIMAT) 2.5-2.5 MCG/ACT AERS Inhale 2 puffs into the lungs daily. 03/27/20  Yes Young, Tarri Fuller D, MD  traMADol (ULTRAM) 50 MG tablet Take 50 mg by mouth every 6 (six) hours as needed for pain.   Yes [provider]  traZODone (DESYREL) 100 MG tablet TAKE 1 BY MOUTH AT BEDTIME 04/15/20  Yes Arfeen, Arlyce Harman, MD   Social History   Socioeconomic History  . Marital status: Married    Spouse name: Not on file  .  Number of children: 0  . Years of education: Not on file  . Highest education level: Not on file  Occupational History  . 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 Smoking status: Former Smoker    Packs/day: 2.00    Years: 26.00    Pack years: 52.00    Types: Cigarettes    Quit date: 08/13/2003    Years since quitting: 16.8  . Smokeless tobacco: Never Used  . Tobacco comment: 2ppd x 26 years  Vaping Use  . Vaping Use: Never used  Substance and Sexual Activity  . Alcohol use: No    Alcohol/week: 0.0 standard drinks    Comment: social  . Drug use: No  . Sexual activity: Yes    Partners: Male    Birth control/protection: None  Other Topics Concern  . Not on file  Social History Narrative   Married. Education: college. Exercise: No.   Social Determinants of Health   Financial Resource Strain:   . Difficulty of Paying Living Expenses: Not on file  Food Insecurity:   . Worried About Charity fundraiser in the Last Year: Not on file  . Ran Out of Food in the Last Year: Not on file  Transportation Needs:   . Lack of Transportation (Medical): Not on file  . Lack of Transportation (Non-Medical): Not on file  Physical Activity:   . Days of Exercise per Week: Not on file  . Minutes of Exercise per Session: Not on file  Stress:   . Feeling  of Stress : Not on file  Social Connections:   . Frequency of Communication with Friends and Family: Not on file  . Frequency of Social Gatherings with Friends and Family: Not on file  . Attends Religious Services: Not on file  . Active Member of Clubs or Organizations: Not on file  . Attends Archivist Meetings: Not on file  . Marital Status: Not on file  Intimate Partner Violence:   . Fear of Current or Ex-Partner: Not on file  . Emotionally Abused: Not on file  . Physically Abused: Not on file  . Sexually Abused: Not on file    Review of Systems Per HPI   Objective:   Vitals:   05/28/20 1514  BP: (!) 141/84  Pulse: 88  Temp: 98.3 F (36.8 C)  TempSrc: Temporal  SpO2: 95%  Weight: (!) 360 lb (163.3 kg)  Height: 6' 2" (1.88 m)     Physical Exam Vitals reviewed.  Constitutional:      General: He is not in acute distress.    Appearance: He is well-developed. He is obese.  HENT:     Head: Normocephalic and atraumatic.  Eyes:     Pupils: Pupils are equal, round, and reactive to light.  Neck:     Vascular: No carotid bruit or JVD.  Cardiovascular:     Rate and Rhythm: Normal rate and regular rhythm.     Heart sounds: Normal heart sounds. No murmur heard.   Pulmonary:     Effort: Pulmonary effort is normal.     Breath sounds: Normal breath sounds. No rales.  Musculoskeletal:     Right lower leg: Edema (tr bilaterally) present.     Left lower leg: Edema present.  Skin:    General: Skin is warm and dry.  Neurological:     Mental Status: He is alert and oriented to person, place, and time.      Assessment & Plan:  Matthew Hutchinson is a 59 y.o. male . Type 2 diabetes mellitus with hyperglycemia, without long-term current use of insulin ([place]) - Plan: Hemoglobin A1c  -Patient had transient hypoglycemia with his recent surgery, overall improving with normal fructosamine as above.  Check A1c, continue same regimen for now with option of higher dose  of Trulicity.  No orders of the defined types were placed in this encounter.  Patient Instructions    Depending on A1c we do have the option to use a higher dose of Trulicity, tomorrow.  No med changes for now.  Thank you for coming in today.    If you have lab work done today you will be contacted with your lab results within the next 2 weeks.  If you have not heard from [place] then please contact us. The fastest way to get your results is to register for My Chart.   IF you received an x-ray today, you will receive an invoice from [facility]. Please contact [facility] Radiology at [phone] with questions or concerns regarding your invoice.   IF you received labwork today, you will receive an invoice from [place]. Please contact [facility] at [phone] with questions or concerns regarding your invoice.   Our billing staff will not be able to assist you with questions regarding bills from these companies.  You will be contacted with the lab results as soon as they are available. The fastest way to get your results is to activate your My Chart account. Instructions are located on the last page of this paperwork. If you have not heard from [place] regarding the results in 2 weeks, please contact this office.         Signed, Merri Ray, MD [facility]

## 2020-05-29 ENCOUNTER — Encounter: Payer: Self-pay | Admitting: Family Medicine

## 2020-05-29 DIAGNOSIS — E118 Type 2 diabetes mellitus with unspecified complications: Secondary | ICD-10-CM | POA: Diagnosis not present

## 2020-05-29 LAB — HEMOGLOBIN A1C
Est. average glucose Bld gHb Est-mCnc: 166 mg/dL
Hgb A1c MFr Bld: 7.4 % — ABNORMAL HIGH (ref 4.8–5.6)

## 2020-06-09 DIAGNOSIS — M79641 Pain in right hand: Secondary | ICD-10-CM | POA: Diagnosis not present

## 2020-07-15 ENCOUNTER — Telehealth (HOSPITAL_COMMUNITY): Payer: BC Managed Care – PPO | Admitting: Psychiatry

## 2020-07-15 ENCOUNTER — Other Ambulatory Visit: Payer: Self-pay

## 2020-07-17 ENCOUNTER — Other Ambulatory Visit (HOSPITAL_COMMUNITY): Payer: Self-pay | Admitting: Psychiatry

## 2020-07-17 DIAGNOSIS — F330 Major depressive disorder, recurrent, mild: Secondary | ICD-10-CM

## 2020-07-18 ENCOUNTER — Other Ambulatory Visit: Payer: Self-pay

## 2020-07-18 ENCOUNTER — Ambulatory Visit (INDEPENDENT_AMBULATORY_CARE_PROVIDER_SITE_OTHER): Payer: BC Managed Care – PPO | Admitting: Psychiatry

## 2020-07-18 ENCOUNTER — Encounter (HOSPITAL_COMMUNITY): Payer: Self-pay | Admitting: Psychiatry

## 2020-07-18 DIAGNOSIS — F330 Major depressive disorder, recurrent, mild: Secondary | ICD-10-CM | POA: Diagnosis not present

## 2020-07-18 DIAGNOSIS — F411 Generalized anxiety disorder: Secondary | ICD-10-CM | POA: Diagnosis not present

## 2020-07-18 MED ORDER — LAMOTRIGINE 150 MG PO TABS: 150.0000 mg | ORAL_TABLET | Freq: Every day | ORAL | 0 refills | Status: DC

## 2020-07-18 MED ORDER — SERTRALINE HCL 100 MG PO TABS
ORAL_TABLET | ORAL | 0 refills | Status: DC
Start: 2020-07-18 — End: 2020-10-16

## 2020-07-18 MED ORDER — HYDROXYZINE PAMOATE 50 MG PO CAPS: ORAL_CAPSULE | ORAL | 0 refills | Status: DC

## 2020-07-18 NOTE — Progress Notes (Signed)
Virtual Visit via Telephone Note  I connected with Matthew Hutchinson on 07/18/20 at  9:40 AM EST by telephone and verified that I am speaking with the correct person using two identifiers.  Location: Patient: Home Provider: Home Office   I discussed the limitations, risks, security and privacy concerns of performing an evaluation and management service by telephone and the availability of in person appointments. I also discussed with the patient that there may be a patient responsible charge related to this service. The patient expressed understanding and agreed to proceed.  History of Present Illness: Patient is evaluated by phone session.  Patient is on the phone by himself.  He is doing well and he feels proud that he went to see his mother around holidays.  He has not done in 2 years and he feels comfortable.  Overall he reported things are going well.  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  He denies any irritability, anger, mania or any crying spells.  He is he still struggling with weight gain and chronic pain.  His hemoglobin A1c is slightly improved but his weight remains a challenge.  He is hoping to start seeing his pain doctor to see if adjustment of the pain medicine helps his ankle pain.  He admitted due to sedentary lifestyle and not exercising contributing weight gain.  He is using CPAP.  He has no tremors, shakes or any EPS.  He does not take trazodone.  He likes his hydroxyzine, Lamictal and Zoloft.  He has no rash, itching, tremors or shakes.  Recently he had a surgery for his trigger finger and that helps his numbness and he does not feel his hand as painful in the morning.  He like to keep his current medication.   Past Psychiatric History: [facility]. H/Ooverdose on Ambien 2 other times but did not require inpatient treatment. History of mood swing, impulsive behavior, speeding ticket, anger, road rage. No history of psychosis or  hallucination.  Recent Results (from the past 2160 hour(s))  HM DIABETES EYE EXAM     Status: None   Collection Time: 05/12/20 12:00 AM  Result Value Ref Range   HM Diabetic Eye Exam No Retinopathy No Retinopathy  Hemoglobin A1c     Status: Abnormal   Collection Time: 05/28/20  4:25 PM  Result Value Ref Range   Hgb A1c MFr Bld 7.4 (H) 4.8 - 5.6 %    Comment:          Prediabetes: 5.7 - 6.4          Diabetes: >6.4          Glycemic control for adults with diabetes: <7.0    Est. average glucose Bld gHb Est-mCnc 166 mg/dL    Psychiatric Specialty Exam: Physical Exam  Review of Systems  Weight (!) 360 lb (163.3 kg).There is no height or weight on file to calculate BMI.  General Appearance: NA  Eye Contact:  NA  Speech:  Clear and Coherent and high pitch  Volume:  Normal  Mood:  Euthymic  Affect:  NA  Thought Process:  Goal Directed  Orientation:  Full (Time, Place, and Person)  Thought Content:  WDL and Logical  Suicidal Thoughts:  No  Homicidal Thoughts:  No  Memory:  Immediate;   Good Recent;   Good Remote;   Good  Judgement:  Intact  Insight:  Present  Psychomotor Activity:  NA  Concentration:  Concentration: Fair and Attention Span: Fair  Recall:  Good  Fund of Knowledge:  Good  Language:  Good  Akathisia:  No  Handed:  Right  AIMS (if indicated):     Assets:  Communication Skills Desire for Improvement Housing Resilience Talents/Skills Transportation  ADL's:  Intact  Cognition:  WNL  Sleep:          Assessment and Plan: Major depressive disorder, recurrent.  Generalized anxiety disorder.  I reviewed blood work results and hemoglobin A 1C.  Patient is still struggling with weight gain and hoping to have better pain management so he can exercise.  He does not want to change the medication since it is working well.  Continue Lamictal 150 mg daily, Zoloft 100 mg daily and hydroxyzine 100 mg at bedtime.  He is not taking trazodone and does not feel he need a  new prescription.  Recommended to call us back with any question or any concern.  Follow-up in 3 months.  Follow Up Instructions:    I discussed the assessment and treatment plan with the patient. The patient was provided an opportunity to ask questions and all were answered. The patient agreed with the plan and demonstrated an understanding of the instructions.   The patient was advised to call back or seek an in-person evaluation if the symptoms worsen or if the condition fails to improve as anticipated.  I provided 19 minutes of non-face-to-face time during this encounter.   Cleotis Nipper, MD

## 2020-07-27 ENCOUNTER — Other Ambulatory Visit: Payer: Self-pay | Admitting: Family Medicine

## 2020-07-27 DIAGNOSIS — Z794 Long term (current) use of insulin: Secondary | ICD-10-CM

## 2020-07-27 DIAGNOSIS — E1165 Type 2 diabetes mellitus with hyperglycemia: Secondary | ICD-10-CM

## 2020-08-05 ENCOUNTER — Other Ambulatory Visit: Payer: Self-pay | Admitting: Family Medicine

## 2020-08-05 DIAGNOSIS — E119 Type 2 diabetes mellitus without complications: Secondary | ICD-10-CM

## 2020-08-06 ENCOUNTER — Other Ambulatory Visit: Payer: Self-pay | Admitting: Family Medicine

## 2020-08-06 DIAGNOSIS — I10 Essential (primary) hypertension: Secondary | ICD-10-CM

## 2020-08-08 ENCOUNTER — Other Ambulatory Visit: Payer: Self-pay | Admitting: Family Medicine

## 2020-08-09 ENCOUNTER — Encounter: Payer: Self-pay | Admitting: Family Medicine

## 2020-08-18 DIAGNOSIS — S82252S Displaced comminuted fracture of shaft of left tibia, sequela: Secondary | ICD-10-CM | POA: Diagnosis not present

## 2020-08-21 DIAGNOSIS — H18831 Recurrent erosion of cornea, right eye: Secondary | ICD-10-CM | POA: Diagnosis not present

## 2020-08-26 DIAGNOSIS — H18831 Recurrent erosion of cornea, right eye: Secondary | ICD-10-CM | POA: Diagnosis not present

## 2020-08-28 ENCOUNTER — Encounter: Payer: Self-pay | Admitting: Family Medicine

## 2020-08-28 ENCOUNTER — Ambulatory Visit: Payer: BC Managed Care – PPO | Admitting: Family Medicine

## 2020-08-28 ENCOUNTER — Other Ambulatory Visit: Payer: Self-pay

## 2020-08-28 VITALS — BP 133/82 | HR 82 | Temp 97.8°F | Ht 74.0 in | Wt 362.0 lb

## 2020-08-28 DIAGNOSIS — H833X2 Noise effects on left inner ear: Secondary | ICD-10-CM | POA: Diagnosis not present

## 2020-08-28 DIAGNOSIS — R066 Hiccough: Secondary | ICD-10-CM

## 2020-08-28 DIAGNOSIS — H6982 Other specified disorders of Eustachian tube, left ear: Secondary | ICD-10-CM | POA: Diagnosis not present

## 2020-08-28 DIAGNOSIS — E1165 Type 2 diabetes mellitus with hyperglycemia: Secondary | ICD-10-CM | POA: Diagnosis not present

## 2020-08-28 DIAGNOSIS — Z6841 Body Mass Index (BMI) 40.0 and over, adult: Secondary | ICD-10-CM

## 2020-08-28 NOTE — Patient Instructions (Addendum)
I would recommend meeting with a diabetic nutritionist regarding new diet with diabetes Can also meet with [facility] to evaluate for other options with weight loss if you would like. Here is their info: [facility]  . [phone]  Try astelin nasal spray for a few days to see if ear noise improves. I will refer you to ENT to discuss as well.   Follow up to discuss hiccups. Return to the clinic or go to the nearest emergency room if any of your symptoms worsen or new symptoms occur.  Depending on A1c we may increase Trulicity. I will let you know.    Eustachian Tube Dysfunction  Eustachian tube dysfunction refers to a condition in which a blockage develops in the narrow passage that connects the middle ear to the back of the nose (eustachian tube). The eustachian tube regulates air pressure in the middle ear by letting air move between the ear and nose. It also helps to drain fluid from the middle ear space. Eustachian tube dysfunction can affect one or both ears. When the eustachian tube does not function properly, air pressure, fluid, or both can build up in the middle ear. What are the causes? This condition occurs when the eustachian tube becomes blocked or cannot open normally. Common causes of this condition include:  Ear infections.  Colds and other infections that affect the nose, mouth, and throat (upper respiratory tract).  Allergies.  Irritation from cigarette smoke.  Irritation from stomach acid coming up into the esophagus (gastroesophageal reflux). The esophagus is the tube that carries food from the mouth to the stomach.  Sudden changes in air pressure, such as from descending in an airplane or scuba diving.  Abnormal growths in the nose or throat, such as: ? Growths that line the nose (nasal polyps). ? Abnormal growth of cells (tumors). ? Enlarged tissue at the back of the throat (adenoids). What increases the risk? You are more  likely to develop this condition if:  You smoke.  You are overweight.  You are a child who has: ? Certain birth defects of the mouth, such as cleft palate. ? Large tonsils or adenoids. What are the signs or symptoms? Common symptoms of this condition include:  A feeling of fullness in the ear.  Ear pain.  Clicking or popping noises in the ear.  Ringing in the ear.  Hearing loss.  Loss of balance.  Dizziness. Symptoms may get worse when the air pressure around you changes, such as when you travel to an area of high elevation, fly on an airplane, or go scuba diving. How is this diagnosed? This condition may be diagnosed based on:  Your symptoms.  A physical exam of your ears, nose, and throat.  Tests, such as those that measure: ? The movement of your eardrum (tympanogram). ? Your hearing (audiometry). How is this treated? Treatment depends on the cause and severity of your condition.  In mild cases, you may relieve your symptoms by moving air into your ears. This is called "popping the ears."  In more severe cases, or if you have symptoms of fluid in your ears, treatment may include: ? Medicines to relieve congestion (decongestants). ? Medicines that treat allergies (antihistamines). ? Nasal sprays or ear drops that contain medicines that reduce swelling (steroids). ? A procedure to drain the fluid in your eardrum (myringotomy). In this procedure, a small tube is placed in the eardrum to:  Drain the fluid.  Restore the air in  the middle ear space. ? A procedure to insert a balloon device through the nose to inflate the opening of the eustachian tube (balloon dilation). Follow these instructions at home: Lifestyle  Do not do any of the following until your health care provider approves: ? Travel to high altitudes. ? Fly in airplanes. ? Work in a Estate agent or room. ? Scuba dive.  Do not use any products that contain nicotine or tobacco, such as  cigarettes and e-cigarettes. If you need help quitting, ask your health care provider.  Keep your ears dry. Wear fitted earplugs during showering and bathing. Dry your ears completely after. General instructions  Take over-the-counter and prescription medicines only as told by your health care provider.  Use techniques to help pop your ears as recommended by your health care provider. These may include: ? Chewing gum. ? Yawning. ? Frequent, forceful swallowing. ? Closing your mouth, holding your nose closed, and gently blowing as if you are trying to blow air out of your nose.  Keep all follow-up visits as told by your health care provider. This is important. Contact a health care provider if:  Your symptoms do not go away after treatment.  Your symptoms come back after treatment.  You are unable to pop your ears.  You have: ? A fever. ? Pain in your ear. ? Pain in your head or neck. ? Fluid draining from your ear.  Your hearing suddenly changes.  You become very dizzy.  You lose your balance. Summary  Eustachian tube dysfunction refers to a condition in which a blockage develops in the eustachian tube.  It can be caused by ear infections, allergies, inhaled irritants, or abnormal growths in the nose or throat.  Symptoms include ear pain, hearing loss, or ringing in the ears.  Mild cases are treated with maneuvers to unblock the ears, such as yawning or ear popping.  Severe cases are treated with medicines. Surgery may also be done (rare). This information is not intended to replace advice given to you by your health care provider. Make sure you discuss any questions you have with your health care provider. Document Revised: 10/18/2017 Document Reviewed: 10/18/2017 Elsevier Patient Education  2021 [employer].     If you have lab work done today you will be contacted with your lab results within the next 2 weeks.  If you have not heard from [place] then please contact  us. The fastest way to get your results is to register for My Chart.   IF you received an x-ray today, you will receive an invoice from [facility] Radiology. Please contact [facility] Radiology at [phone] with questions or concerns regarding your invoice.   IF you received labwork today, you will receive an invoice from [place]. Please contact [facility] at [phone] with questions or concerns regarding your invoice.   Our billing staff will not be able to assist you with questions regarding bills from these companies.  You will be contacted with the lab results as soon as they are available. The fastest way to get your results is to activate your My Chart account. Instructions are located on the last page of this paperwork. If you have not heard from [place] regarding the results in 2 weeks, please contact this office.

## 2020-08-28 NOTE — Progress Notes (Signed)
Subjective:  Patient ID: Matthew Hutchinson, male    DOB: 06-19-1961  Age: 60 y.o. MRN: [id number]  CC:  Chief Complaint  Patient presents with  . Follow-up    On diabetes. Pt repoprts his BS has been running higher than usual. The average BS reading is 193 mg/dL.   Marland Kitchen patint questions    Pt is wanting the provider to fill out his handy cap parking pass form.  PT states when he hic-ups he gets a gargaling sound in his L ear. Pt states he thinks he needs an ENT referral. PT reports his insurance is replacing Lantuss with semglee. PT wants the providers thoughts on this. Pt is wanting the providers thoughts on KETO. PT is having eye surgery tomorrow.    HPI Matthew Hutchinson presents for   Diabetes: Complicated by hyperglycemia.  Last visit November 17. Had been off Trulicity for a few weeks, decided to continue same regimen based on improving A1c. Continued on Trulicity 7.67 mg daily , Metformin 500 mg twice daily, Lantus 62 units/day. No new side effects of meds. lantus will be changed to [facility].  On ARB and statin. Home readings fasting: 170's, lowest 111, no symptomatic lows. Knows sx's.  Home readings 2 hours postprandial: 180.  Plan for eye surgery tomorrow, Dr. 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 Referred to Dr. Sharen Counter at [facility] for corneal evaluation at his February 10 visit. Needs handicap placard renewed - uses cane for leg issues.  Considering keto diet? Insurance has recommended [employer].  Plans to meet with them to discuss diet first.  Has met with 2 nutritionists in past. On gabapentin 631m BID.   Microalbumin: Normal ratio in November 2020. Unable to provide urine at last visit. Optho, foot exam, pneumovax: Up-to-date  Lab Results  Component Value Date   HGBA1C 7.4 (H) 05/28/2020   HGBA1C 8.4 (H) 02/14/2020   HGBA1C 8.7 (H) 11/14/2019   Lab Results  Component Value Date   MICROALBUR 0.6 06/10/2016   LDLCALC 81 02/14/2020    CREATININE 0.86 02/14/2020   Noise in left ear: Hiccups in the past. Has had some recur past few months - gurgling noise in left ear, no change in hearing, no drainage in ear, no pain in ear. Only in left ear. Some chronic nasal congestion with lying down at times.  History Patient Active Problem List   Diagnosis Date Noted  . Newly diagnosed diabetes ([facility] [place] 07/27/2018  . Morbid obesity ([facility] 02/02/2017  . Chronic pain due to trauma 06/25/2013  . Depressive disorder, not elsewhere classified 11/09/2012  . CAD (coronary artery disease) 02/21/2012  . Lipid disorder 02/21/2012  . Angina pectoris 09/27/2011  . Obstructive sleep apnea 05/20/2010  . Essential hypertension 04/17/2010  . COPD mixed type ([facility] 04/17/2010  . WEIGHT GAIN, ABNORMAL 04/17/2010   Past Medical History:  Diagnosis Date  . Allergy   . Anxiety   . Arthritis   . COPD (chronic obstructive pulmonary disease) ([facility]   . Depression   . Emphysema of lung ([facility]   . Hyperlipidemia   . Morbid obesity ([facility]   . OSA (obstructive sleep apnea)    uses CPAP nightly  . PONV (postoperative nausea and vomiting)   . Unspecified essential hypertension    Past Surgical History:  Procedure Laterality Date  . INSERTION OF MESH N/A 09/18/2015   Procedure: INSERTION OF MESH;  Surgeon: DCoralie Keens MD;  [place] [facility]  Service: General;  Laterality: N/A;  .  LEG SURGERY Left   . SHOULDER ARTHROSCOPY Left   . TONSILLECTOMY    . UMBILICAL HERNIA REPAIR N/A 09/18/2015   Procedure: HERNIA REPAIR UMBILICAL ADULT WITH MESH ;  Surgeon: Coralie Keens, MD;  Location: [place];  Service: General;  Laterality: N/A;   Allergies  Allergen Reactions  . Penicillins Anaphylaxis    REACTION: anaphylaxis  . Valsartan     REACTION: itch/swelling   Prior to Admission medications   Medication Sig Start Date End Date Taking? Authorizing Provider  amLODipine (NORVASC) 5 MG tablet TAKE 1 TABLET BY MOUTH  EVERY DAY 08/08/20  Yes Wendie Agreste, MD  aspirin 81 MG tablet Take 1 tablet (81 mg total) by mouth daily. 11/13/12  Yes Patrecia Pour, NP  azelastine (ASTELIN) 0.1 % nasal spray  06/29/18  Yes [provider]  b complex vitamins tablet Take 1 tablet by mouth daily.   Yes [provider]  blood glucose meter kit and supplies Dispense based on patient and insurance preference. Use 2-3 times per day. 05/09/18  Yes Wendie Agreste, MD  calcium gluconate 500 MG tablet Take 500 mg by mouth daily.   Yes [provider]  Cholecalciferol (VITAMIN D) 2000 UNITS tablet Take 1 tablet (2,000 Units total) by mouth daily. 11/13/12  Yes Patrecia Pour, NP  doxycycline (VIBRAMYCIN) 100 MG capsule Take by mouth. 04/11/20  Yes [provider]  gabapentin (NEURONTIN) 300 MG capsule TAKE 2 CAPSULES BY MOUTH TWICE A DAY 05/13/20  Yes Wendie Agreste, MD  hydrOXYzine (VISTARIL) 50 MG capsule TAKE 2 CAPSULES BY MOUTH AT BEDTIME 07/18/20  Yes Arfeen, Arlyce Harman, MD  insulin glargine (LANTUS SOLOSTAR) 100 UNIT/ML Solostar Pen Inject 50 Units into the skin daily. Pharm req 30 day supply instead of 90. 05/02/20  Yes Wendie Agreste, MD  Insulin Pen Needle (TECHLITE PEN NEEDLES) 32G X 4 MM MISC Use once daily with lantus. Dx E11.9, Z79.4 04/14/20  Yes Jacelyn Pi, Lilia Argue, MD  lamoTRIgine (LAMICTAL) 150 MG tablet Take 1 tablet (150 mg total) by mouth daily. 07/18/20  Yes Arfeen, Arlyce Harman, MD  losartan-hydrochlorothiazide (HYZAAR) 100-25 MG tablet TAKE 1 TABLET BY MOUTH EVERY DAY 08/06/20  Yes Wendie Agreste, MD  Lutein 40 MG CAPS Take 1 capsule by mouth daily.   Yes [provider]  magnesium gluconate (MAGONATE) 500 MG tablet Take 500 mg by mouth daily.   Yes [provider]  metFORMIN (GLUCOPHAGE) 500 MG tablet TAKE 1 TABLET (500 MG TOTAL) BY MOUTH 2 (TWO) TIMES DAILY WITH A MEAL. 08/05/20  Yes Wendie Agreste, MD  Multiple Vitamin (MULTIVITAMIN) capsule Take 1 capsule by  mouth daily. 11/13/12  Yes Lord, Asa Saunas, NP  nitroGLYCERIN (NITROSTAT) 0.4 MG SL tablet Place 1 tablet (0.4 mg total) under the tongue every 5 (five) minutes as needed for chest pain. If chest pain not resolved, after 3 doses 5 minutes apart--call 911 11/13/12  Yes Patrecia Pour, NP  oxyCODONE (OXY IR/ROXICODONE) 5 MG immediate release tablet Take by mouth. 05/26/20  Yes [provider]  PLENVU 140 g SOLR See admin instructions. 07/21/18  Yes [provider]  Potassium 99 MG TABS Take 1 tablet by mouth daily.   Yes [provider]  sertraline (ZOLOFT) 100 MG tablet TAKE 1 TABLET BY MOUTH DAILY 07/18/20  Yes Arfeen, Arlyce Harman, MD  Shark Cartilage 740 MG CAPS Take 1 capsule by mouth 3 (three) times daily.   Yes  [provider]  simvastatin (ZOCOR) 20 MG tablet TAKE 1 TABLET BY MOUTH AT BEDTIME, 02/12/20  Yes Greene, Jeffrey R, MD  traMADol (ULTRAM) 50 MG tablet Take 50 mg by mouth every 6 (six) hours as needed for pain.   Yes [provider]  traZODone (DESYREL) 100 MG tablet TAKE 1 BY MOUTH AT BEDTIME 04/15/20  Yes Arfeen, Syed T, MD  TRULICITY 0.75 MG/0.5ML SOPN INJECT 0.5 MLS (0.75 MG TOTAL) INTO THE SKIN ONCE A WEEK. 07/27/20  Yes Greene, Jeffrey R, MD  Tiotropium Bromide-Olodaterol (STIOLTO RESPIMAT) 2.5-2.5 MCG/ACT AERS Inhale 2 puffs into the lungs daily. Patient not taking: Reported on 08/28/2020 03/27/20   Young, Clinton D, MD   Social History   Socioeconomic History  . Marital status: Married    Spouse name: Not on file  . Number of children: 0  . Years of education: Not on file  . Highest education level: Not on file  Occupational History  . 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 Smoking status: Former Smoker    Packs/day: 2.00    Years: 26.00    Pack years: 52.00    Types: Cigarettes    Quit date: 08/13/2003    Years since quitting: 17.0  . Smokeless tobacco: Never Used  . Tobacco comment: 2ppd x 26 years  Vaping Use  . Vaping Use:  Never used  Substance and Sexual Activity  . Alcohol use: No    Alcohol/week: 0.0 standard drinks    Comment: social  . Drug use: No  . Sexual activity: Yes    Partners: Male    Birth control/protection: None  Other Topics Concern  . Not on file  Social History Narrative   Married. Education: college. Exercise: No.   Social Determinants of Health   Financial Resource Strain: Not on file  Food Insecurity: Not on file  Transportation Needs: Not on file  Physical Activity: Not on file  Stress: Not on file  Social Connections: Not on file  Intimate Partner Violence: Not on file    Review of Systems  Eyes: Positive for visual disturbance (R eye, blurring, has surgery planned soon. ).  Respiratory: Negative for chest tightness and shortness of breath (chronic no new symptoms. ).   Cardiovascular: Positive for leg swelling (better during day. ). Negative for chest pain and palpitations.  Neurological: Negative for dizziness, light-headedness and headaches.     Objective:   Vitals:   08/28/20 1516  BP: 133/82  Pulse: 82  Temp: 97.8 F (36.6 C)  TempSrc: Temporal  SpO2: 91%  Weight: (!) 362 lb (164.2 kg)  Height: 6' 2" (1.88 m)     Physical Exam Vitals reviewed.  Constitutional:      Appearance: He is well-developed and well-nourished. He is obese.  HENT:     Head: Normocephalic and atraumatic.     Right Ear: Tympanic membrane, ear canal and external ear normal. There is no impacted cerumen.     Left Ear: Tympanic membrane, ear canal and external ear normal. There is no impacted cerumen.  Eyes:     Extraocular Movements: EOM normal.     Pupils: Pupils are equal, round, and reactive to light.  Neck:     Vascular: No carotid bruit or JVD.  Cardiovascular:     Rate and Rhythm: Normal rate and regular rhythm.     Heart sounds: Normal heart sounds. No murmur heard.   Pulmonary:     Effort: Pulmonary effort is normal.       Breath sounds: Normal breath sounds. No  rales.  Musculoskeletal:     Right lower leg: Edema (trace, nonptitting. ) present.     Left lower leg: Edema (trace, nonpitting. ) present.  Skin:    General: Skin is warm and dry.  Neurological:     Mental Status: He is alert and oriented to person, place, and time.  Psychiatric:        Mood and Affect: Mood and affect normal.        Assessment & Plan:  Matthew Hutchinson is a 59 y.o. male . Noise effect on inner ear, left - Plan: Ambulatory referral to ENT Dysfunction of left eustachian tube - Plan: Ambulatory referral to ENT  -possible eustachian tube dysfunction based on symptoms. Discussed trying Astelin nasal spray to see if that may help. We will also refer to ENT at his request.  Hiccups  -longstanding with intermittent flares. Plans to return for separate visit to discuss further. RTC precautions/precautions of acute worsening of his symptoms.  Type 2 diabetes mellitus with hyperglycemia, without long-term current use of insulin (HCC) - Plan: Hemoglobin A1c, Comprehensive metabolic panel, Lipid panel  -borderline control previously, home readings suggest will increase meds if needed. Likely will increase Trulicity. Check A1c first.  BMI 50.0-59.9, adult (HCC)  -difficulty with weight loss, has met with nutritionist previously. Phone number provided for [facility]. Has reported possible new diet recommended by his insurance company but would like to investigate that further. I did recommend discussing with nutritionist prior to starting new diet.   No orders of the defined types were placed in this encounter.  Patient Instructions   I would recommend meeting with a diabetic nutritionist regarding new diet with diabetes Can also meet with [facility] to evaluate for other options with weight loss if you would like. Here is their info: [facility]  . [phone]  Try astelin nasal spray for a few days to see if ear  noise improves. I will refer you to ENT to discuss as well.   Follow up to discuss hiccups. Return to the clinic or go to the nearest emergency room if any of your symptoms worsen or new symptoms occur.  Depending on A1c we may increase Trulicity. I will let you know.    Eustachian Tube Dysfunction  Eustachian tube dysfunction refers to a condition in which a blockage develops in the narrow passage that connects the middle ear to the back of the nose (eustachian tube). The eustachian tube regulates air pressure in the middle ear by letting air move between the ear and nose. It also helps to drain fluid from the middle ear space. Eustachian tube dysfunction can affect one or both ears. When the eustachian tube does not function properly, air pressure, fluid, or both can build up in the middle ear. What are the causes? This condition occurs when the eustachian tube becomes blocked or cannot open normally. Common causes of this condition include:  Ear infections.  Colds and other infections that affect the nose, mouth, and throat (upper respiratory tract).  Allergies.  Irritation from cigarette smoke.  Irritation from stomach acid coming up into the esophagus (gastroesophageal reflux). The esophagus is the tube that carries food from the mouth to the stomach.  Sudden changes in air pressure, such as from descending in an airplane or scuba diving.  Abnormal growths in the nose or throat, such as: ? Growths that line the nose (nasal polyps). ?   Abnormal growth of cells (tumors). ? Enlarged tissue at the back of the throat (adenoids). What increases the risk? You are more likely to develop this condition if:  You smoke.  You are overweight.  You are a child who has: ? Certain birth defects of the mouth, such as cleft palate. ? Large tonsils or adenoids. What are the signs or symptoms? Common symptoms of this condition include:  A feeling of fullness in the ear.  Ear  pain.  Clicking or popping noises in the ear.  Ringing in the ear.  Hearing loss.  Loss of balance.  Dizziness. Symptoms may get worse when the air pressure around you changes, such as when you travel to an area of high elevation, fly on an airplane, or go scuba diving. How is this diagnosed? This condition may be diagnosed based on:  Your symptoms.  A physical exam of your ears, nose, and throat.  Tests, such as those that measure: ? The movement of your eardrum (tympanogram). ? Your hearing (audiometry). How is this treated? Treatment depends on the cause and severity of your condition.  In mild cases, you may relieve your symptoms by moving air into your ears. This is called "popping the ears."  In more severe cases, or if you have symptoms of fluid in your ears, treatment may include: ? Medicines to relieve congestion (decongestants). ? Medicines that treat allergies (antihistamines). ? Nasal sprays or ear drops that contain medicines that reduce swelling (steroids). ? A procedure to drain the fluid in your eardrum (myringotomy). In this procedure, a small tube is placed in the eardrum to:  Drain the fluid.  Restore the air in the middle ear space. ? A procedure to insert a balloon device through the nose to inflate the opening of the eustachian tube (balloon dilation). Follow these instructions at home: Lifestyle  Do not do any of the following until your health care provider approves: ? Travel to high altitudes. ? Fly in airplanes. ? Work in a pressurized cabin or room. ? Scuba dive.  Do not use any products that contain nicotine or tobacco, such as cigarettes and e-cigarettes. If you need help quitting, ask your health care provider.  Keep your ears dry. Wear fitted earplugs during showering and bathing. Dry your ears completely after. General instructions  Take over-the-counter and prescription medicines only as told by your health care provider.  Use  techniques to help pop your ears as recommended by your health care provider. These may include: ? Chewing gum. ? Yawning. ? Frequent, forceful swallowing. ? Closing your mouth, holding your nose closed, and gently blowing as if you are trying to blow air out of your nose.  Keep all follow-up visits as told by your health care provider. This is important. Contact a health care provider if:  Your symptoms do not go away after treatment.  Your symptoms come back after treatment.  You are unable to pop your ears.  You have: ? A fever. ? Pain in your ear. ? Pain in your head or neck. ? Fluid draining from your ear.  Your hearing suddenly changes.  You become very dizzy.  You lose your balance. Summary  Eustachian tube dysfunction refers to a condition in which a blockage develops in the eustachian tube.  It can be caused by ear infections, allergies, inhaled irritants, or abnormal growths in the nose or throat.  Symptoms include ear pain, hearing loss, or ringing in the ears.  Mild cases are treated with   maneuvers to unblock the ears, such as yawning or ear popping.  Severe cases are treated with medicines. Surgery may also be done (rare). This information is not intended to replace advice given to you by your health care provider. Make sure you discuss any questions you have with your health care provider. Document Revised: 10/18/2017 Document Reviewed: 10/18/2017 Elsevier Patient Education  2021 [employer].     If you have lab work done today you will be contacted with your lab results within the next 2 weeks.  If you have not heard from us then please contact us. The fastest way to get your results is to register for My Chart.   IF you received an x-ray today, you will receive an invoice from [facility]. Please contact [facility] at [phone] with questions or concerns regarding your invoice.   IF you received labwork today, you will receive  an invoice from [facility]. Please contact [facility] at [phone] with questions or concerns regarding your invoice.   Our billing staff will not be able to assist you with questions regarding bills from these companies.  You will be contacted with the lab results as soon as they are available. The fastest way to get your results is to activate your My Chart account. Instructions are located on the last page of this paperwork. If you have not heard from us regarding the results in 2 weeks, please contact this office.         Signed, Jeffrey Greene, MD [facility]  

## 2020-08-29 DIAGNOSIS — H18831 Recurrent erosion of cornea, right eye: Secondary | ICD-10-CM | POA: Diagnosis not present

## 2020-08-29 LAB — COMPREHENSIVE METABOLIC PANEL
ALT: 28 IU/L (ref 0–44)
AST: 26 IU/L (ref 0–40)
Albumin/Globulin Ratio: 1.8 (ref 1.2–2.2)
Albumin: 4.4 g/dL (ref 3.8–4.9)
Alkaline Phosphatase: 99 IU/L (ref 44–121)
BUN/Creatinine Ratio: 9 (ref 9–20)
BUN: 9 mg/dL (ref 6–24)
Bilirubin Total: 0.5 mg/dL (ref 0.0–1.2)
CO2: 21 mmol/L (ref 20–29)
Calcium: 9.8 mg/dL (ref 8.7–10.2)
Chloride: 103 mmol/L (ref 96–106)
Creatinine, Ser: 0.95 mg/dL (ref 0.76–1.27)
GFR calc Af Amer: 101 mL/min/{1.73_m2} (ref >59)
GFR calc non Af Amer: 87 mL/min/{1.73_m2} (ref >59)
Globulin, Total: 2.5 g/dL (ref 1.5–4.5)
Glucose: 132 mg/dL — ABNORMAL HIGH (ref 65–99)
Potassium: 4.5 mmol/L (ref 3.5–5.2)
Sodium: 142 mmol/L (ref 134–144)
Total Protein: 6.9 g/dL (ref 6.0–8.5)

## 2020-08-29 LAB — LIPID PANEL
Chol/HDL Ratio: 3.3 ratio (ref 0.0–5.0)
Cholesterol, Total: 143 mg/dL (ref 100–199)
HDL: 43 mg/dL (ref >39)
LDL Chol Calc (NIH): 78 mg/dL (ref 0–99)
Triglycerides: 125 mg/dL (ref 0–149)
VLDL Cholesterol Cal: 22 mg/dL (ref 5–40)

## 2020-08-29 LAB — HEMOGLOBIN A1C
Est. average glucose Bld gHb Est-mCnc: 174 mg/dL
Hgb A1c MFr Bld: 7.7 % — ABNORMAL HIGH (ref 4.8–5.6)

## 2020-09-01 ENCOUNTER — Other Ambulatory Visit: Payer: Self-pay | Admitting: Family Medicine

## 2020-09-01 DIAGNOSIS — G8929 Other chronic pain: Secondary | ICD-10-CM

## 2020-09-01 DIAGNOSIS — M79605 Pain in left leg: Secondary | ICD-10-CM

## 2020-09-06 DIAGNOSIS — M25572 Pain in left ankle and joints of left foot: Secondary | ICD-10-CM | POA: Diagnosis not present

## 2020-09-07 ENCOUNTER — Other Ambulatory Visit: Payer: Self-pay | Admitting: Family Medicine

## 2020-09-07 DIAGNOSIS — E1165 Type 2 diabetes mellitus with hyperglycemia: Secondary | ICD-10-CM

## 2020-09-07 DIAGNOSIS — Z794 Long term (current) use of insulin: Secondary | ICD-10-CM

## 2020-09-07 MED ORDER — TRULICITY 1.5 MG/0.5ML ~~LOC~~ SOAJ: 1.5000 mg | SUBCUTANEOUS | 1 refills | Status: DC

## 2020-09-12 ENCOUNTER — Other Ambulatory Visit: Payer: Self-pay | Admitting: Family Medicine

## 2020-09-12 DIAGNOSIS — M25572 Pain in left ankle and joints of left foot: Secondary | ICD-10-CM | POA: Diagnosis not present

## 2020-09-12 DIAGNOSIS — M25372 Other instability, left ankle: Secondary | ICD-10-CM | POA: Diagnosis not present

## 2020-09-12 DIAGNOSIS — E119 Type 2 diabetes mellitus without complications: Secondary | ICD-10-CM

## 2020-09-17 DIAGNOSIS — M25572 Pain in left ankle and joints of left foot: Secondary | ICD-10-CM | POA: Diagnosis not present

## 2020-09-19 ENCOUNTER — Encounter: Payer: Self-pay | Admitting: Family Medicine

## 2020-09-26 ENCOUNTER — Other Ambulatory Visit: Payer: Self-pay | Admitting: Family Medicine

## 2020-09-26 DIAGNOSIS — E1165 Type 2 diabetes mellitus with hyperglycemia: Secondary | ICD-10-CM

## 2020-10-02 ENCOUNTER — Encounter: Payer: Self-pay | Admitting: Family Medicine

## 2020-10-03 ENCOUNTER — Other Ambulatory Visit (HOSPITAL_COMMUNITY): Payer: Self-pay | Admitting: Psychiatry

## 2020-10-03 ENCOUNTER — Other Ambulatory Visit: Payer: Self-pay | Admitting: Family Medicine

## 2020-10-03 DIAGNOSIS — E119 Type 2 diabetes mellitus without complications: Secondary | ICD-10-CM

## 2020-10-03 DIAGNOSIS — G8929 Other chronic pain: Secondary | ICD-10-CM

## 2020-10-03 DIAGNOSIS — I10 Essential (primary) hypertension: Secondary | ICD-10-CM

## 2020-10-03 DIAGNOSIS — F330 Major depressive disorder, recurrent, mild: Secondary | ICD-10-CM

## 2020-10-03 DIAGNOSIS — E785 Hyperlipidemia, unspecified: Secondary | ICD-10-CM

## 2020-10-03 DIAGNOSIS — E1165 Type 2 diabetes mellitus with hyperglycemia: Secondary | ICD-10-CM

## 2020-10-03 NOTE — Telephone Encounter (Signed)
Notes to clinic: Patient requesting all medication refill before office closes    Requested Prescriptions  Pending Prescriptions Disp Refills   amLODipine (NORVASC) 5 MG tablet [Pharmacy Med Name: AMLODIPINE BESYLATE 5 MG TAB] 90 tablet 0    Sig: TAKE 1 TABLET BY MOUTH EVERY DAY      Cardiovascular:  Calcium Channel Blockers Passed - 10/03/2020  2:36 PM      Passed - Last BP in normal range    BP Readings from Last 1 Encounters:  08/28/20 133/82          Passed - Valid encounter within last 6 months    Recent Outpatient Visits           1 month ago Noise effect on inner ear, left   [facility], Ranell Patrick, MD   4 months ago Type 2 diabetes mellitus with hyperglycemia, without long-term current use of insulin [facility])   [facility], Ranell Patrick, MD   6 months ago Type 2 diabetes mellitus with hyperglycemia, with long-term current use of insulin [facility])   [facility], Ranell Patrick, MD   7 months ago Type 2 diabetes mellitus with hyperglycemia, with long-term current use of insulin [facility])   [facility], Ranell Patrick, MD   10 months ago Type 2 diabetes mellitus with hyperglycemia, with long-term current use of insulin [facility])   [facility], Ranell Patrick, MD                  losartan-hydrochlorothiazide (HYZAAR) 100-25 MG tablet [Pharmacy Med Name: LOSARTAN-HCTZ 100-25 MG TAB] 90 tablet 0    Sig: TAKE 1 TABLET BY MOUTH EVERY DAY      Cardiovascular: ARB + Diuretic Combos Passed - 10/03/2020  2:36 PM      Passed - K in normal range and within 180 days    Potassium  Date Value Ref Range Status  08/28/2020 4.5 3.5 - 5.2 mmol/L Final          Passed - Na in normal range and within 180 days    Sodium  Date Value Ref Range Status  08/28/2020 142 134 - 144 mmol/L Final          Passed - Cr in normal range and within 180 days    Creat  Date Value Ref Range Status  06/10/2016 1.00 0.70 - 1.33  mg/dL Final    Comment:      For patients > or = 60 years of age: The upper reference limit for Creatinine is approximately 13% higher for people identified as African-American.      Creatinine, Ser  Date Value Ref Range Status  08/28/2020 0.95 0.76 - 1.27 mg/dL Final          Passed - Ca in normal range and within 180 days    Calcium  Date Value Ref Range Status  08/28/2020 9.8 8.7 - 10.2 mg/dL Final   Calcium, Ion  Date Value Ref Range Status  03/19/2008 1.14  Final          Passed - Patient is not pregnant      Passed - Last BP in normal range    BP Readings from Last 1 Encounters:  08/28/20 133/82          Passed - Valid encounter within last 6 months    Recent Outpatient Visits  1 month ago Noise effect on inner ear, left   [facility], Ranell Patrick, MD   4 months ago Type 2 diabetes mellitus with hyperglycemia, without long-term current use of insulin [facility])   [facility], Ranell Patrick, MD   6 months ago Type 2 diabetes mellitus with hyperglycemia, with long-term current use of insulin [facility])   [facility], Ranell Patrick, MD   7 months ago Type 2 diabetes mellitus with hyperglycemia, with long-term current use of insulin [facility])   [facility], Ranell Patrick, MD   10 months ago Type 2 diabetes mellitus with hyperglycemia, with long-term current use of insulin [facility])   [facility], Ranell Patrick, MD                  simvastatin (ZOCOR) 20 MG tablet [Pharmacy Med Name: SIMVASTATIN 20 MG TABLET] 90 tablet 2    Sig: TAKE 1 TABLET BY MOUTH AT BEDTIME,      Cardiovascular:  Antilipid - Statins Failed - 10/03/2020  2:36 PM      Failed - LDL in normal range and within 360 days    LDL Chol Calc (NIH)  Date Value Ref Range Status  08/28/2020 78 0 - 99 mg/dL Final          Passed - Total Cholesterol in normal range and within 360 days    Cholesterol, Total  Date Value Ref Range  Status  08/28/2020 143 100 - 199 mg/dL Final          Passed - HDL in normal range and within 360 days    HDL  Date Value Ref Range Status  08/28/2020 43 >39 mg/dL Final          Passed - Triglycerides in normal range and within 360 days    Triglycerides  Date Value Ref Range Status  08/28/2020 125 0 - 149 mg/dL Final          Passed - Patient is not pregnant      Passed - Valid encounter within last 12 months    Recent Outpatient Visits           1 month ago Noise effect on inner ear, left   [facility], Ranell Patrick, MD   4 months ago Type 2 diabetes mellitus with hyperglycemia, without long-term current use of insulin [facility])   [facility], Ranell Patrick, MD   6 months ago Type 2 diabetes mellitus with hyperglycemia, with long-term current use of insulin [facility])   [facility], Ranell Patrick, MD   7 months ago Type 2 diabetes mellitus with hyperglycemia, with long-term current use of insulin [facility])   [facility], Ranell Patrick, MD   10 months ago Type 2 diabetes mellitus with hyperglycemia, with long-term current use of insulin [facility])   [facility], Ranell Patrick, MD                  gabapentin (NEURONTIN) 300 MG capsule [Pharmacy Med Name: GABAPENTIN 300 MG CAPSULE] 360 capsule 0    Sig: TAKE 2 [place]      Neurology: Anticonvulsants - gabapentin Passed - 10/03/2020  2:36 PM      Passed - Valid encounter within last 12 months    Recent Outpatient Visits  1 month ago Noise effect on inner ear, left   [facility], Ranell Patrick, MD   4 months ago Type 2 diabetes mellitus with hyperglycemia, without long-term current use of insulin [facility])   [facility], Ranell Patrick, MD   6 months ago Type 2 diabetes mellitus with hyperglycemia, with long-term current use of insulin [facility])   [facility], Ranell Patrick, MD   7 months ago Type  2 diabetes mellitus with hyperglycemia, with long-term current use of insulin [facility])   [facility], Ranell Patrick, MD   10 months ago Type 2 diabetes mellitus with hyperglycemia, with long-term current use of insulin [facility])   [facility], Ranell Patrick, MD                  TRULICITY 6.22 QJ/3.3LK SOPN [Pharmacy Med Name: TRULICITY 5.62 BW/3.8 ML PEN]  1    Sig: INJECT 0.5 MLS (0.75 MG TOTAL) INTO THE SKIN ONCE A WEEK.      Endocrinology:  Diabetes - GLP-1 Receptor Agonists Passed - 10/03/2020  2:36 PM      Passed - HBA1C is between 0 and 7.9 and within 180 days    Hgb A1c MFr Bld  Date Value Ref Range Status  08/28/2020 7.7 (H) 4.8 - 5.6 % Final    Comment:             Prediabetes: 5.7 - 6.4          Diabetes: >6.4          Glycemic control for adults with diabetes: <7.0           Passed - Valid encounter within last 6 months    Recent Outpatient Visits           1 month ago Noise effect on inner ear, left   [facility], Ranell Patrick, MD   4 months ago Type 2 diabetes mellitus with hyperglycemia, without long-term current use of insulin [facility])   [facility], Ranell Patrick, MD   6 months ago Type 2 diabetes mellitus with hyperglycemia, with long-term current use of insulin [facility])   [facility], Ranell Patrick, MD   7 months ago Type 2 diabetes mellitus with hyperglycemia, with long-term current use of insulin [facility])   [facility], Ranell Patrick, MD   10 months ago Type 2 diabetes mellitus with hyperglycemia, with long-term current use of insulin [facility])   [facility], Ranell Patrick, MD                  metFORMIN (GLUCOPHAGE) 500 MG tablet [Pharmacy Med Name: METFORMIN HCL 500 MG TABLET] 90 tablet 0    Sig: TAKE 1 TABLET BY MOUTH 2 TIMES DAILY WITH A MEAL.      Endocrinology:  Diabetes - Biguanides Passed - 10/03/2020  2:36 PM      Passed - Cr in normal range and within 360  days    Creat  Date Value Ref Range Status  06/10/2016 1.00 0.70 - 1.33 mg/dL Final    Comment:      For patients > or = 60 years of age: The upper reference limit for Creatinine is approximately 13% higher for people identified as African-American.      Creatinine, Ser  Date Value Ref Range Status  08/28/2020 0.95 0.76 - 1.27 mg/dL Final  Passed - HBA1C is between 0 and 7.9 and within 180 days    Hgb A1c MFr Bld  Date Value Ref Range Status  08/28/2020 7.7 (H) 4.8 - 5.6 % Final    Comment:             Prediabetes: 5.7 - 6.4          Diabetes: >6.4          Glycemic control for adults with diabetes: <7.0           Passed - eGFR in normal range and within 360 days    GFR, Est African American  Date Value Ref Range Status  06/10/2016 >89 >=60 mL/min Final   GFR calc Af Amer  Date Value Ref Range Status  08/28/2020 101 >59 mL/min/1.73 Final    Comment:    **In accordance with recommendations from the NKF-ASN Task force,**   [facility] is in the process of updating its eGFR calculation to the   2021 CKD-EPI creatinine equation that estimates kidney function   without a race variable.    GFR, Est Non African American  Date Value Ref Range Status  06/10/2016 84 >=60 mL/min Final   GFR calc non Af Amer  Date Value Ref Range Status  08/28/2020 87 >59 mL/min/1.73 Final          Passed - Valid encounter within last 6 months    Recent Outpatient Visits           1 month ago Noise effect on inner ear, left   [facility], Ranell Patrick, MD   4 months ago Type 2 diabetes mellitus with hyperglycemia, without long-term current use of insulin [facility])   [facility], Ranell Patrick, MD   6 months ago Type 2 diabetes mellitus with hyperglycemia, with long-term current use of insulin [facility])   [facility], Ranell Patrick, MD   7 months ago Type 2 diabetes mellitus with hyperglycemia, with long-term current use of insulin [facility])    [facility], Ranell Patrick, MD   10 months ago Type 2 diabetes mellitus with hyperglycemia, with long-term current use of insulin [facility])   [facility], Ranell Patrick, MD

## 2020-10-06 ENCOUNTER — Other Ambulatory Visit: Payer: Self-pay

## 2020-10-06 DIAGNOSIS — E1165 Type 2 diabetes mellitus with hyperglycemia: Secondary | ICD-10-CM

## 2020-10-06 DIAGNOSIS — E119 Type 2 diabetes mellitus without complications: Secondary | ICD-10-CM

## 2020-10-06 DIAGNOSIS — Z794 Long term (current) use of insulin: Secondary | ICD-10-CM

## 2020-10-06 NOTE — Telephone Encounter (Signed)
Pt requesting refills until can see Dr Neva Seat. Gabapentin was filled for 90 day in February

## 2020-10-16 ENCOUNTER — Telehealth (INDEPENDENT_AMBULATORY_CARE_PROVIDER_SITE_OTHER): Payer: BC Managed Care – PPO | Admitting: Psychiatry

## 2020-10-16 ENCOUNTER — Other Ambulatory Visit: Payer: Self-pay

## 2020-10-16 ENCOUNTER — Encounter (HOSPITAL_COMMUNITY): Payer: Self-pay | Admitting: Psychiatry

## 2020-10-16 DIAGNOSIS — F411 Generalized anxiety disorder: Secondary | ICD-10-CM

## 2020-10-16 DIAGNOSIS — F330 Major depressive disorder, recurrent, mild: Secondary | ICD-10-CM | POA: Diagnosis not present

## 2020-10-16 MED ORDER — SERTRALINE HCL 100 MG PO TABS
ORAL_TABLET | ORAL | 0 refills | Status: DC
Start: 2020-10-16 — End: 2021-01-15

## 2020-10-16 MED ORDER — HYDROXYZINE PAMOATE 50 MG PO CAPS
ORAL_CAPSULE | ORAL | 0 refills | Status: DC
Start: 2020-10-16 — End: 2021-01-15

## 2020-10-16 MED ORDER — LAMOTRIGINE 150 MG PO TABS: 150.0000 mg | ORAL_TABLET | Freq: Every day | ORAL | 0 refills | Status: DC

## 2020-10-16 NOTE — Progress Notes (Signed)
Virtual Visit via Telephone Note  I connected with Matthew Hutchinson on 10/16/20 at  3:00 PM EDT by telephone and verified that I am speaking with the correct person using two identifiers.  Location: Patient: Home Provider: Home Office   I discussed the limitations, risks, security and privacy concerns of performing an evaluation and management service by telephone and the availability of in person appointments. I also discussed with the patient that there may be a patient responsible charge related to this service. The patient expressed understanding and agreed to proceed.   History of Present Illness: Patient is evaluated by phone session.  She is taking her medication but lately taking trazodone more than usual.  He reported sometimes it is stressful because when he goes outside people look at him.  He still feel unsafe some time in the public places when people talk against gay and homosexuals.  He does not go to grocery store unless it is important as he does we are mask and people look at him.  Recently he is more anxious about his vision because his right eye has dryness and he has to take eyedrops every few hours which is very annoying.  Otherwise his job, relationship is going well.  He believes businesses much better and recently he had blood work but his hemoglobin A1c is slightly increased.  His physician is adjusting his Trulicity and is hoping next reading will be better.  His sleep is good with the CPAP.  He has no tremors, shakes or any EPS.  Other than his usual concern about his health and public and journal no major issues.  He wants to keep the current medication.  He denies any mania, psychosis, hallucination.  Denies any suicidal thoughts.  Past Psychiatric History[facility]. H/Ooverdose on Ambien 2 other times but did not require inpatient treatment. History of mood swing, impulsive behavior, speeding ticket, anger, road rage. No  history of psychosis or hallucination.   Recent Results (from the past 2160 hour(s))  Hemoglobin A1c     Status: Abnormal   Collection Time: 08/28/20  4:01 PM  Result Value Ref Range   Hgb A1c MFr Bld 7.7 (H) 4.8 - 5.6 %    Comment:          Prediabetes: 5.7 - 6.4          Diabetes: >6.4          Glycemic control for adults with diabetes: <7.0    Est. average glucose Bld gHb Est-mCnc 174 mg/dL  Comprehensive metabolic panel     Status: Abnormal   Collection Time: 08/28/20  4:01 PM  Result Value Ref Range   Glucose 132 (H) 65 - 99 mg/dL   BUN 9 6 - 24 mg/dL   Creatinine, Ser 0.95 0.76 - 1.27 mg/dL   GFR calc non Af Amer 87 >59 mL/min/1.73   GFR calc Af Amer 101 >59 mL/min/1.73    Comment: **In accordance with recommendations from the NKF-ASN Task force,**   [facility] is in the process of updating its eGFR calculation to the   2021 CKD-EPI creatinine equation that estimates kidney function   without a race variable.    BUN/Creatinine Ratio 9 9 - 20   Sodium 142 134 - 144 mmol/L   Potassium 4.5 3.5 - 5.2 mmol/L   Chloride 103 96 - 106 mmol/L   CO2 21 20 - 29 mmol/L   Calcium 9.8 8.7 - 10.2 mg/dL   Total Protein 6.9  6.0 - 8.5 g/dL   Albumin 4.4 3.8 - 4.9 g/dL   Globulin, Total 2.5 1.5 - 4.5 g/dL   Albumin/Globulin Ratio 1.8 1.2 - 2.2   Bilirubin Total 0.5 0.0 - 1.2 mg/dL   Alkaline Phosphatase 99 44 - 121 IU/L   AST 26 0 - 40 IU/L   ALT 28 0 - 44 IU/L  Lipid panel     Status: None   Collection Time: 08/28/20  4:01 PM  Result Value Ref Range   Cholesterol, Total 143 100 - 199 mg/dL   Triglycerides 125 0 - 149 mg/dL   HDL 43 >39 mg/dL   VLDL Cholesterol Cal 22 5 - 40 mg/dL   LDL Chol Calc (NIH) 78 0 - 99 mg/dL   Chol/HDL Ratio 3.3 0.0 - 5.0 ratio    Comment:                                   T. Chol/HDL Ratio                                             Men  Women                               1/2 Avg.Risk  3.4    3.3                                   Avg.Risk  5.0    4.4                                 2X Avg.Risk  9.6    7.1                                3X Avg.Risk 23.4   11.0     Psychiatric Specialty Exam: Physical Exam  Review of Systems  Weight (!) 362 lb (164.2 kg).There is no height or weight on file to calculate BMI.  General Appearance: NA  Eye Contact:  NA  Speech:  Clear and Coherent  Volume:  Normal  Mood:  Euthymic  Affect:  NA  Thought Process:  Descriptions of Associations: Intact  Orientation:  Full (Time, Place, and Person)  Thought Content:  Logical  Suicidal Thoughts:  No  Homicidal Thoughts:  No  Memory:  Immediate;   Good Recent;   Good Remote;   Good  Judgement:  Intact  Insight:  Present  Psychomotor Activity:  NA  Concentration:  Concentration: Fair and Attention Span: Fair  Recall:  Good  Fund of Knowledge:  Good  Language:  Good  Akathisia:  No  Handed:  Right  AIMS (if indicated):     Assets:  Communication Skills Desire for [place] [employer]/Skills Transportation  ADL's:  Intact  Cognition:  WNL  Sleep:   ok with cpap      Assessment and Plan: Major depressive disorder, recurrent.  Generalized anxiety disorder.  I reviewed blood work results.  His hemoglobin A1c is slightly increased but his physician adjusting Trulicity.  He started taking trazodone which helps his anxiety before going to sleep but he is still has a lot of leftover and does not want a new prescription.  He has no rash or any itching.  Continue Lamictal 150 mg daily, Zoloft 20 mg daily and hydroxyzine 100 mg at bedtime.  Recommended to call us back if is any question or any concern.  Follow-up in 3 months.  Follow Up Instructions:    I discussed the assessment and treatment plan with the patient. The patient was provided an opportunity to ask questions and all were answered. The patient agreed with the plan and demonstrated an understanding of the instructions.   The patient was advised to call  back or seek an in-person evaluation if the symptoms worsen or if the condition fails to improve as anticipated.  I provided 19 minutes of non-face-to-face time during this encounter.   Kathlee Nations, MD

## 2020-11-07 ENCOUNTER — Other Ambulatory Visit: Payer: Self-pay | Admitting: Registered Nurse

## 2020-11-07 DIAGNOSIS — E119 Type 2 diabetes mellitus without complications: Secondary | ICD-10-CM

## 2020-11-19 DIAGNOSIS — E118 Type 2 diabetes mellitus with unspecified complications: Secondary | ICD-10-CM | POA: Diagnosis not present

## 2020-11-19 DIAGNOSIS — Z794 Long term (current) use of insulin: Secondary | ICD-10-CM | POA: Diagnosis not present

## 2020-11-24 DIAGNOSIS — E118 Type 2 diabetes mellitus with unspecified complications: Secondary | ICD-10-CM | POA: Diagnosis not present

## 2020-11-24 DIAGNOSIS — Z794 Long term (current) use of insulin: Secondary | ICD-10-CM | POA: Diagnosis not present

## 2020-12-10 ENCOUNTER — Ambulatory Visit: Payer: BC Managed Care – PPO | Admitting: Family Medicine

## 2020-12-10 ENCOUNTER — Encounter: Payer: Self-pay | Admitting: Family Medicine

## 2020-12-10 ENCOUNTER — Other Ambulatory Visit: Payer: Self-pay

## 2020-12-10 VITALS — BP 132/78 | HR 83 | Temp 98.3°F | Wt 361.2 lb

## 2020-12-10 DIAGNOSIS — M79605 Pain in left leg: Secondary | ICD-10-CM

## 2020-12-10 DIAGNOSIS — E119 Type 2 diabetes mellitus without complications: Secondary | ICD-10-CM

## 2020-12-10 DIAGNOSIS — I10 Essential (primary) hypertension: Secondary | ICD-10-CM | POA: Diagnosis not present

## 2020-12-10 DIAGNOSIS — R066 Hiccough: Secondary | ICD-10-CM | POA: Diagnosis not present

## 2020-12-10 DIAGNOSIS — Z794 Long term (current) use of insulin: Secondary | ICD-10-CM | POA: Diagnosis not present

## 2020-12-10 DIAGNOSIS — E1165 Type 2 diabetes mellitus with hyperglycemia: Secondary | ICD-10-CM | POA: Diagnosis not present

## 2020-12-10 DIAGNOSIS — G8929 Other chronic pain: Secondary | ICD-10-CM

## 2020-12-10 MED ORDER — LOSARTAN POTASSIUM-HCTZ 100-25 MG PO TABS: 1.0000 | ORAL_TABLET | Freq: Every day | ORAL | 1 refills | Status: DC

## 2020-12-10 MED ORDER — AMLODIPINE BESYLATE 5 MG PO TABS: 1.0000 | ORAL_TABLET | Freq: Every day | ORAL | 1 refills | Status: DC

## 2020-12-10 MED ORDER — GABAPENTIN 300 MG PO CAPS: 2.0000 | ORAL_CAPSULE | Freq: Two times a day (BID) | ORAL | 0 refills | Status: DC

## 2020-12-10 MED ORDER — METFORMIN HCL 500 MG PO TABS: 1.0000 | ORAL_TABLET | Freq: Two times a day (BID) | ORAL | 1 refills | Status: DC

## 2020-12-10 MED ORDER — TRULICITY 1.5 MG/0.5ML ~~LOC~~ SOAJ: 1.5000 mg | SUBCUTANEOUS | 1 refills | Status: DC

## 2020-12-10 MED ORDER — SIMVASTATIN 20 MG PO TABS: 20.0000 mg | ORAL_TABLET | Freq: Every day | ORAL | 0 refills | Status: DC

## 2020-12-10 NOTE — Progress Notes (Signed)
Subjective:  Patient ID: Matthew Hutchinson, male    DOB: 1960-12-19  Age: 60 y.o. MRN: [id number]  CC:  Chief Complaint  Patient presents with  . Diabetes  . Hiccups    Did not answer calls from ENT - did not know number so they did not answer and no messages were left   . Weight Loss    Never joined [facility] - $100-200 upfront charge to join their program, unable to pay    HPI Matthew Hutchinson presents for   Diabetes: Complicated by hyperglycemia.  Last discussed in February, had been off Trulicity for few weeks, then back on the previous 3 weeks.  Was continued on Trulicity  - increased to 1.$RemoveBefore'5mg'[id number]$  weekly. metformin 500 mg twice daily, Lantus 62 units daily.  Home readings: Fasting 130-140 up to 190 after rice night prior.  2 hr postprandial in 200's.  No symptomatic lows.  Difficulty with starches in diet, nondairy creamer.  Off simvastatin 3 weeks - was not filled, but pharmacy has record it was filled - insurance will not fill again until July. He is on ARB.   Microalbumin: due. Prior normal  Recommended discussion on [facility] or nutritionist.  Phone number provided to [facility].  Cost prohibitive. Agrees to nutritionist - asking about Keto  Optho, foot exam, pneumovax: up to date.   Lab Results  Component Value Date   HGBA1C 7.7 (H) 08/28/2020   HGBA1C 7.4 (H) 05/28/2020   HGBA1C 8.4 (H) 02/14/2020   Lab Results  Component Value Date   MICROALBUR 0.6 06/10/2016   LDLCALC 78 08/28/2020   CREATININE 0.95 08/28/2020   Hiccups Has noted hiccups in the past, discussed in February.  Gurgling noise in his left ear but no change in hearing or drainage.  On further discussion was noted to be longstanding with intermittent flares.  Thought to have component of eustachian tube dysfunction, Astelin nasal spray prescribed and referred to ENT.  Was not able to see ENT as above - received a call last week, unable to  connect to ENT.  Still hiccups multiple times per day. Comes and goes in waves - not as severe as in past. Gurgling sound has resolved. No change with astelin nasal spray now. Seemed to resolve on own.  Defers shingles vaccine today. Will consider at future visit.   History Patient Active Problem List   Diagnosis Date Noted  . Newly diagnosed diabetes ([employer]) 07/27/2018  . Morbid obesity ([place]) 02/02/2017  . Chronic pain due to trauma 06/25/2013  . Depressive disorder, not elsewhere classified 11/09/2012  . CAD (coronary artery disease) 02/21/2012  . Lipid disorder 02/21/2012  . Angina pectoris 09/27/2011  . Obstructive sleep apnea 05/20/2010  . Essential hypertension 04/17/2010  . COPD mixed type ([place]) 04/17/2010  . WEIGHT GAIN, ABNORMAL 04/17/2010   Past Medical History:  Diagnosis Date  . Allergy   . Anxiety   . Arthritis   . COPD (chronic obstructive pulmonary disease) ([place])   . Depression   . Emphysema of lung ([place])   . Hyperlipidemia   . Morbid obesity ([place])   . OSA (obstructive sleep apnea)    uses CPAP nightly  . PONV (postoperative nausea and vomiting)   . Unspecified essential hypertension    Past Surgical History:  Procedure Laterality Date  . INSERTION OF MESH N/A 09/18/2015   Procedure: INSERTION OF MESH;  Surgeon: Coralie Keens, MD;  Location: [facility];  Service: General;  Laterality: N/A;  . LEG SURGERY Left   . SHOULDER ARTHROSCOPY Left   . TONSILLECTOMY    . UMBILICAL HERNIA REPAIR N/A 09/18/2015   Procedure: HERNIA REPAIR UMBILICAL ADULT WITH MESH ;  Surgeon: Coralie Keens, MD;  Location: [facility];  Service: General;  Laterality: N/A;   Allergies  Allergen Reactions  . Penicillins Anaphylaxis    REACTION: anaphylaxis  . Valsartan     REACTION: itch/swelling   Prior to Admission medications   Medication Sig Start Date End Date Taking? Authorizing Provider  amLODipine (NORVASC) 5 MG tablet TAKE 1 TABLET BY MOUTH  EVERY DAY 10/06/20  Yes Maximiano Coss, NP  aspirin 81 MG tablet Take 1 tablet (81 mg total) by mouth daily. 11/13/12  Yes Patrecia Pour, NP  azelastine (ASTELIN) 0.1 % nasal spray  06/29/18  Yes [provider]  b complex vitamins tablet Take 1 tablet by mouth daily.   Yes [provider]  blood glucose meter kit and supplies Dispense based on patient and insurance preference. Use 2-3 times per day. 05/09/18  Yes Wendie Agreste, MD  calcium gluconate 500 MG tablet Take 500 mg by mouth daily.   Yes [provider]  Carboxymethylcellulose Sodium (EYE DROPS OP) Apply to eye. Prednisone acetate USP 1% BID   Yes [provider]  Cholecalciferol (VITAMIN D) 2000 UNITS tablet Take 1 tablet (2,000 Units total) by mouth daily. 11/13/12  Yes Lord, Asa Saunas, NP  Dulaglutide (TRULICITY) 1.5 QQ/5.9DG SOPN Inject 1.5 mg into the skin once a week. 09/07/20  Yes Wendie Agreste, MD  gabapentin (NEURONTIN) 300 MG capsule TAKE 2 CAPSULES BY MOUTH TWICE A DAY 10/06/20  Yes Maximiano Coss, NP  hydrOXYzine (VISTARIL) 50 MG capsule TAKE 2 CAPSULES BY MOUTH AT BEDTIME 10/16/20  Yes Arfeen, Arlyce Harman, MD  insulin glargine (LANTUS SOLOSTAR) 100 UNIT/ML Solostar Pen Inject 50 Units into the skin daily. Pharm req 30 day supply instead of 90. 05/02/20  Yes Wendie Agreste, MD  Insulin Pen Needle (TECHLITE PEN NEEDLES) 32G X 4 MM MISC Use once daily with lantus. Dx E11.9, Z79.4 04/14/20  Yes Jacelyn Pi, Lilia Argue, MD  lamoTRIgine (LAMICTAL) 150 MG tablet Take 1 tablet (150 mg total) by mouth daily. 10/16/20  Yes Arfeen, Arlyce Harman, MD  losartan-hydrochlorothiazide (HYZAAR) 100-25 MG tablet TAKE 1 TABLET BY MOUTH EVERY DAY 10/06/20  Yes Maximiano Coss, NP  Lutein 40 MG CAPS Take 1 capsule by mouth daily.   Yes [provider]  magnesium gluconate (MAGONATE) 500 MG tablet Take 500 mg by mouth daily.   Yes [provider]  metFORMIN (GLUCOPHAGE) 500 MG tablet TAKE 1 TABLET BY MOUTH 2  TIMES DAILY WITH A MEAL. 11/07/20  Yes Maximiano Coss, NP  Multiple Vitamin (MULTIVITAMIN) capsule Take 1 capsule by mouth daily. 11/13/12  Yes Lord, Asa Saunas, NP  nitroGLYCERIN (NITROSTAT) 0.4 MG SL tablet Place 1 tablet (0.4 mg total) under the tongue every 5 (five) minutes as needed for chest pain. If chest pain not resolved, after 3 doses 5 minutes apart--call 911 11/13/12  Yes Patrecia Pour, NP  oxyCODONE (OXY IR/ROXICODONE) 5 MG immediate release tablet Take by mouth. 05/26/20  Yes [provider]  PLENVU 140 g SOLR See admin instructions. 07/21/18  Yes [provider]  Potassium 99 MG TABS Take 1 tablet by mouth daily.   Yes [provider]  sertraline (ZOLOFT) 100 MG tablet TAKE 1 TABLET BY MOUTH  DAILY 10/16/20  Yes Arfeen, Arlyce Harman, MD  Shark Cartilage 740 MG CAPS Take 1 capsule by mouth 3 (three) times daily.   Yes [provider]  simvastatin (ZOCOR) 20 MG tablet TAKE 1 TABLET BY MOUTH AT BEDTIME, 10/06/20  Yes Maximiano Coss, NP  traMADol (ULTRAM) 50 MG tablet Take 50 mg by mouth every 6 (six) hours as needed for pain.   Yes [provider]  traZODone (DESYREL) 100 MG tablet TAKE 1 BY MOUTH AT BEDTIME 04/15/20  Yes Arfeen, Arlyce Harman, MD  Tiotropium Bromide-Olodaterol (STIOLTO RESPIMAT) 2.5-2.5 MCG/ACT AERS Inhale 2 puffs into the lungs daily. Patient not taking: Reported on 08/28/2020 03/27/20   Baird Lyons D, MD  TRULICITY 9.41 DE/0.8XK SOPN INJECT 0.5 MLS (0.75 MG TOTAL) INTO THE SKIN ONCE A WEEK. 10/06/20   Maximiano Coss, NP   Social History   Socioeconomic History  . Marital status: Married    Spouse name: Not on file  . Number of children: 0  . Years of education: Not on file  . Highest education level: Not on file  Occupational History  . 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 Smoking status: Former Smoker    Packs/day: 2.00    Years: 26.00    Pack years: 52.00    Types: Cigarettes    Quit date: 08/13/2003    Years since  quitting: 17.3  . Smokeless tobacco: Never Used  . Tobacco comment: 2ppd x 26 years  Vaping Use  . Vaping Use: Never used  Substance and Sexual Activity  . Alcohol use: No    Alcohol/week: 0.0 standard drinks    Comment: social  . Drug use: No  . Sexual activity: Yes    Partners: Male    Birth control/protection: None  Other Topics Concern  . Not on file  Social History Narrative   Married. Education: college. Exercise: No.   Social Determinants of Health   Financial Resource Strain: Not on file  Food Insecurity: Not on file  Transportation Needs: Not on file  Physical Activity: Not on file  Stress: Not on file  Social Connections: Not on file  Intimate Partner Violence: Not on file    Review of Systems Per HPI.   Objective:   Vitals:   12/10/20 1554  BP: 132/78  Pulse: 83  Temp: 98.3 F (36.8 C)  TempSrc: Temporal  SpO2: 94%  Weight: (!) 361 lb 3.2 oz (163.8 kg)     Physical Exam Vitals reviewed.  Constitutional:      Appearance: He is well-developed. He is obese.  HENT:     Head: Normocephalic and atraumatic.  Eyes:     Pupils: Pupils are equal, round, and reactive to light.  Neck:     Vascular: No carotid bruit or JVD.  Cardiovascular:     Rate and Rhythm: Normal rate and regular rhythm.     Heart sounds: Normal heart sounds. No murmur heard.   Pulmonary:     Effort: Pulmonary effort is normal.     Breath sounds: Normal breath sounds. No rales.  Skin:    General: Skin is warm and dry.  Neurological:     Mental Status: He is alert and oriented to person, place, and time.    32 minutes spent during visit, including chart review, counseling and assimilation of information, exam, discussion of plan, and chart completion.  Assessment & Plan:  Matthew Hutchinson is a 60 y.o. male . Type 2 diabetes mellitus with hyperglycemia, with  long-term current use of insulin ([place]) - Plan: Urine Microalbumin w/creat. ratio, HgB A1c, Comp Met (CMET), Amb  ref to Medical Nutrition Therapy-MNT, simvastatin (ZOCOR) 20 MG tablet, Dulaglutide (TRULICITY) 1.5 BI/3.7RP SOPN  -Check labs, likely will need med adjustments.  Can hold on filling Trulicity until results back as likely will need change in dose.    -Refer to nutritionist to discuss diet changes.  If he does decide to pursue keto diet, may be best to have close monitoring with nutritionist and possible endocrinologist during that time.  May also need endocrinologist if continued difficulty with diabetic control.  -Temporary statin prescription given, can fill with good Rx until he is able to have commercial insurance refill.  Hiccups  -Intermittent symptoms.  Longstanding.  Consider baclofen if persistent or ENT/GI eval if more frequent.  Chronic pain of left lower extremity - Plan: gabapentin (NEURONTIN) 300 MG capsule  -Stable, continue gabapentin.  Essential hypertension - Plan: losartan-hydrochlorothiazide (HYZAAR) 100-25 MG tablet  -Stable, continue same regimen.  Labs as above.  Type 2 diabetes mellitus without complication, without long-term current use of insulin ([place]) - Plan: metFORMIN (GLUCOPHAGE) 500 MG tablet  -As above, anticipate change in regimen.  Meds ordered this encounter  Medications  . simvastatin (ZOCOR) 20 MG tablet    Sig: Take 1 tablet (20 mg total) by mouth at bedtime.    Dispense:  30 tablet    Refill:  0  . amLODipine (NORVASC) 5 MG tablet    Sig: Take 1 tablet (5 mg total) by mouth daily.    Dispense:  90 tablet    Refill:  1  . gabapentin (NEURONTIN) 300 MG capsule    Sig: Take 2 capsules (600 mg total) by mouth 2 (two) times daily.    Dispense:  360 capsule    Refill:  0  . losartan-hydrochlorothiazide (HYZAAR) 100-25 MG tablet    Sig: Take 1 tablet by mouth daily.    Dispense:  90 tablet    Refill:  1  . metFORMIN (GLUCOPHAGE) 500 MG tablet    Sig: Take 1 tablet (500 mg total) by mouth 2 (two) times daily with a meal.    Dispense:  180 tablet     Refill:  1  . Dulaglutide (TRULICITY) 1.5 ZP/6.8GA SOPN    Sig: Inject 1.5 mg into the skin once a week.    Dispense:  6 mL    Refill:  1   Patient Instructions  I will check labs to decide on changes. I will refer you to nutritionist, but if you decide to do a keto diet, it may be best for endocrinology to manage your diabetes. Depending on control of diabetes, may also need to have endocrinology manage your diabetes.   I printed simvastatin for 30 day supply to try to fill with [facility] until able to refill other prescription.      Signed, Merri Ray, MD [facility]

## 2020-12-10 NOTE — Patient Instructions (Addendum)
I will check labs to decide on changes. I will refer you to nutritionist, but if you decide to do a keto diet, it may be best for endocrinology to manage your diabetes. Depending on control of diabetes, may also need to have endocrinology manage your diabetes.   I printed simvastatin for 30 day supply to try to fill with [facility] until able to refill other prescription.

## 2020-12-11 LAB — HEMOGLOBIN A1C: Hgb A1c MFr Bld: 7.3 % — ABNORMAL HIGH (ref 4.6–6.5)

## 2020-12-12 ENCOUNTER — Encounter: Payer: Self-pay | Admitting: Family Medicine

## 2020-12-12 LAB — COMPREHENSIVE METABOLIC PANEL
ALT: 23 U/L (ref 0–53)
AST: 25 U/L (ref 0–37)
Albumin: 4.5 g/dL (ref 3.5–5.2)
Alkaline Phosphatase: 81 U/L (ref 39–117)
BUN: 10 mg/dL (ref 6–23)
CO2: 21 mEq/L (ref 19–32)
Calcium: 9.3 mg/dL (ref 8.4–10.5)
Chloride: 102 mEq/L (ref 96–112)
Creatinine, Ser: 0.97 mg/dL (ref 0.40–1.50)
GFR: 85.26 mL/min (ref >60.00)
Glucose, Bld: 96 mg/dL (ref 70–99)
Potassium: 4.3 mEq/L (ref 3.5–5.1)
Sodium: 139 mEq/L (ref 135–145)
Total Bilirubin: 0.5 mg/dL (ref 0.2–1.2)
Total Protein: 7.4 g/dL (ref 6.0–8.3)

## 2020-12-15 ENCOUNTER — Telehealth: Payer: BC Managed Care – PPO | Admitting: Emergency Medicine

## 2020-12-15 DIAGNOSIS — J069 Acute upper respiratory infection, unspecified: Secondary | ICD-10-CM

## 2020-12-15 MED ORDER — BENZONATATE 100 MG PO CAPS: 100.0000 mg | ORAL_CAPSULE | Freq: Three times a day (TID) | ORAL | 0 refills | Status: DC | PRN

## 2020-12-15 NOTE — Progress Notes (Signed)
We are sorry you are not feeling well.  Here is how we plan to help!  Based on what you have shared with me, it looks like you may have a viral upper respiratory infection.  Upper respiratory infections are caused by a large number of viruses; however, rhinovirus is the most common cause.   Symptoms vary from person to person, with common symptoms including sore throat, cough, fatigue or lack of energy and feeling of general discomfort.  A low-grade fever of up to 100.4 may present, but is often uncommon.  Symptoms vary however, and are closely related to a person's age or underlying illnesses.  The most common symptoms associated with an upper respiratory infection are nasal discharge or congestion, cough, sneezing, headache and pressure in the ears and face.  These symptoms usually persist for about 3 to 10 days, but can last up to 2 weeks.  It is important to know that upper respiratory infections do not cause serious illness or complications in most cases.    Upper respiratory infections can be transmitted from person to person, with the most common method of transmission being a person's hands.  The virus is able to live on the skin and can infect other persons for up to 2 hours after direct contact.  Also, these can be transmitted when someone coughs or sneezes; thus, it is important to cover the mouth to reduce this risk.  To keep the spread of the illness at bay, good hand hygiene is very important.  This is an infection that is most likely caused by a virus. There are no specific treatments other than to help you with the symptoms until the infection runs its course.  We are sorry you are not feeling well.  Here is how we plan to help!   For nasal congestion, you may use an oral decongestants such as Mucinex D or if you have glaucoma or high blood pressure use plain Mucinex.  Saline nasal spray or nasal drops can help and can safely be used as often as needed for congestion.  For your congestion,  continue your Astelin nasal spray as prescribed.   If you do not have a history of heart disease, hypertension, diabetes or thyroid disease, prostate/bladder issues or glaucoma, you may also use Sudafed to treat nasal congestion.  It is highly recommended that you consult with a pharmacist or your primary care physician to ensure this medication is safe for you to take.     If you have a cough, you may use cough suppressants such as Delsym and Robitussin.  If you have glaucoma or high blood pressure, you can also use Coricidin HBP.   For cough I have prescribed for you A prescription cough medication called Tessalon Perles 100 mg. You may take 1-2 capsules every 8 hours as needed for cough  If you have a sore or scratchy throat, use a saltwater gargle-  to  teaspoon of salt dissolved in a 4-ounce to 8-ounce glass of warm water.  Gargle the solution for approximately 15-30 seconds and then spit.  It is important not to swallow the solution.  You can also use throat lozenges/cough drops and Chloraseptic spray to help with throat pain or discomfort.  Warm or cold liquids can also be helpful in relieving throat pain.  For headache, pain or general discomfort, you can use Ibuprofen or Tylenol as directed.   Some authorities believe that zinc sprays or the use of Echinacea may shorten the course of  your symptoms.   HOME CARE . Only take medications as instructed by your medical team. . Be sure to drink plenty of fluids. Water is fine as well as fruit juices, sodas and electrolyte beverages. You may want to stay away from caffeine or alcohol. If you are nauseated, try taking small sips of liquids. How do you know if you are getting enough fluid? Your urine should be a pale yellow or almost colorless. . Get rest. . Taking a steamy shower or using a humidifier may help nasal congestion and ease sore throat pain. You can place a towel over your head and breathe in the steam from hot water coming from a  faucet. . Using a saline nasal spray works much the same way. . Cough drops, hard candies and sore throat lozenges may ease your cough. . Avoid close contacts especially the very young and the elderly . Cover your mouth if you cough or sneeze . Always remember to wash your hands.   GET HELP RIGHT AWAY IF: . You develop worsening fever. . If your symptoms do not improve within 10 days . You develop yellow or green discharge from your nose over 3 days. . You have coughing fits . You develop a severe head ache or visual changes. . You develop shortness of breath, difficulty breathing or start having chest pain . Your symptoms persist after you have completed your treatment plan  MAKE SURE YOU   Understand these instructions.  Will watch your condition.  Will get help right away if you are not doing well or get worse.  Your e-visit answers were reviewed by a board certified advanced clinical practitioner to complete your personal care plan. Depending upon the condition, your plan could have included both over the counter or prescription medications. Please review your pharmacy choice. If there is a problem, you may call our nursing hot line at and have the prescription routed to another pharmacy. Your safety is important to [place]. If you have drug allergies check your prescription carefully.   You can use MyChart to ask questions about today's visit, request a non-urgent call back, or ask for a work or school excuse for 24 hours related to this e-Visit. If it has been greater than 24 hours you will need to follow up with your provider, or enter a new e-Visit to address those concerns. You will get an e-mail in the next two days asking about your experience.  I hope that your e-visit has been valuable and will speed your recovery. Thank you for using e-visits.    Approximately 5 minutes was spent documenting and reviewing patient's chart.

## 2020-12-23 ENCOUNTER — Encounter: Payer: Self-pay | Admitting: Family Medicine

## 2020-12-25 ENCOUNTER — Other Ambulatory Visit: Payer: Self-pay | Admitting: Family Medicine

## 2020-12-25 DIAGNOSIS — E1165 Type 2 diabetes mellitus with hyperglycemia: Secondary | ICD-10-CM

## 2020-12-25 DIAGNOSIS — Z794 Long term (current) use of insulin: Secondary | ICD-10-CM

## 2020-12-25 MED ORDER — TRULICITY 3 MG/0.5ML ~~LOC~~ SOAJ: 3.0000 mg | SUBCUTANEOUS | 1 refills | Status: DC

## 2020-12-25 NOTE — Progress Notes (Signed)
See lab notes

## 2021-01-15 ENCOUNTER — Other Ambulatory Visit: Payer: Self-pay

## 2021-01-15 ENCOUNTER — Other Ambulatory Visit (HOSPITAL_COMMUNITY): Payer: Self-pay | Admitting: Psychiatry

## 2021-01-15 ENCOUNTER — Telehealth (INDEPENDENT_AMBULATORY_CARE_PROVIDER_SITE_OTHER): Payer: BC Managed Care – PPO | Admitting: Psychiatry

## 2021-01-15 ENCOUNTER — Encounter (HOSPITAL_COMMUNITY): Payer: Self-pay | Admitting: Psychiatry

## 2021-01-15 DIAGNOSIS — F330 Major depressive disorder, recurrent, mild: Secondary | ICD-10-CM

## 2021-01-15 DIAGNOSIS — F411 Generalized anxiety disorder: Secondary | ICD-10-CM | POA: Diagnosis not present

## 2021-01-15 MED ORDER — HYDROXYZINE PAMOATE 50 MG PO CAPS: ORAL_CAPSULE | ORAL | 0 refills | Status: DC

## 2021-01-15 MED ORDER — LAMOTRIGINE 150 MG PO TABS: 150.0000 mg | ORAL_TABLET | Freq: Every day | ORAL | 0 refills | Status: DC

## 2021-01-15 MED ORDER — SERTRALINE HCL 100 MG PO TABS: ORAL_TABLET | ORAL | 0 refills | Status: DC

## 2021-01-15 NOTE — Progress Notes (Signed)
Virtual Visit via Telephone Note  I connected with Matthew Hutchinson on 01/15/21 at  4:00 PM EDT by telephone and verified that I am speaking with the correct person using two identifiers.  Location: Patient: Home Provider: Home Office   I discussed the limitations, risks, security and privacy concerns of performing an evaluation and management service by telephone and the availability of in person appointments. I also discussed with the patient that there may be a patient responsible charge related to this service. The patient expressed understanding and agreed to proceed.   History of Present Illness: Patient is evaluated by phone session.  He is upset with the [employer] decision.  He feels that his dizziness and his life is not safe because he is gay.  He is even thinking to make a passport if he needs to move out from this country.  He is married but not sure if neck step is to abolish the game marriage.  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  He admitted sometime sad, depressed, irritable but denies any suicidal thoughts or homicidal thoughts.  He recently had a blood work and his hemoglobin A1c is marginally improved however he still struggle with weight.  He is not sure what to do but looking into other options for weight loss.  He cannot afford bariatric surgery.  He admitted some nights he sleeps too much because he does not feel motivated to do things when he hears about particular needles.  He is using CPAP.  He denies any psychosis, hallucination, homicidal or suicidal thoughts.  He has no tremors or shakes.  He like to keep his current medication.  He has not taken trazodone in many months but like to keep his Lamictal, hydroxyzine and Zoloft.   Past Psychiatric History:  H/O inpatient at [facility] for overdose on Ambien.  H/O overdose on Ambien 2 other times  but did not require inpatient treatment.  History of mood swing, impulsive behavior, speeding ticket, anger, road rage.  No history of psychosis or hallucination.  Recent Results (from the past 2160 hour(s))  HgB A1c     Status: Abnormal   Collection Time: 12/10/20  4:06 PM  Result Value Ref Range   Hgb A1c MFr Bld 7.3 (H) 4.6 - 6.5 %    Comment: Glycemic Control Guidelines for People with Diabetes:Non Diabetic:  <6%Goal of Therapy: <7%Additional Action Suggested:  >8%   Comp Met (CMET)     Status: None   Collection Time: 12/10/20  4:06 PM  Result Value Ref Range   Sodium 139 135 - 145 mEq/L   Potassium 4.3 3.5 - 5.1 mEq/L   Chloride 102 96 - 112 mEq/L   CO2 21 19 - 32 mEq/L   Glucose, Bld 96 70 - 99 mg/dL   BUN 10 6 - 23 mg/dL   Creatinine, Ser 0.97 0.40 - 1.50 mg/dL   Total Bilirubin 0.5 0.2 - 1.2 mg/dL   Alkaline Phosphatase 81 39 - 117 U/L   AST 25 0 - 37 U/L   ALT 23 0 - 53 U/L   Total Protein 7.4 6.0 - 8.3 g/dL   Albumin 4.5 3.5 - 5.2 g/dL   GFR 85.26 >60.00 mL/min    Comment: Calculated using the CKD-EPI Creatinine Equation (2021)   Calcium 9.3 8.4 - 10.5 mg/dL      Psychiatric  Specialty Exam: Physical Exam  Review of Systems  Weight (!) 361 lb (163.7 kg).Body mass index is 46.35 kg/m.  General Appearance: NA  Eye Contact:  NA  Speech:  Normal Rate  Volume:   high pitch  Mood:  Depressed and Irritable  Affect:  NA  Thought Process:  Goal Directed  Orientation:  Full (Time, Place, and Person)  Thought Content:  Rumination  Suicidal Thoughts:  No  Homicidal Thoughts:  No  Memory:  Immediate;   Good Recent;   Good Remote;   Good  Judgement:  Intact  Insight:  Present  Psychomotor Activity:  NA  Concentration:  Concentration: Good and Attention Span: Good  Recall:  Good  Fund of Knowledge:  Good  Language:  Good  Akathisia:  No  Handed:  Right  AIMS (if indicated):     Assets:  Communication Skills Desire for Improvement  ADL's:  Intact  Cognition:  WNL   Sleep:   too much      Assessment and Plan: Major depressive disorder, recurrent.  Generalized anxiety disorder.  I reviewed blood work results.  Discussed about his concern as patient not happy with the Supreme Court decision but he is handling the decision and now working for his future plan if his marriage and business in jeopardy.  He is taking 2 make a passport if need to move out of the country.  Patient is a gay he is married.  His husband is very supportive.  He has no issue with the medication.  Discussed safety concerns and anytime having active suicidal thoughts or homicidal thought and need to call 911 or go to local [place].  We will continue Zoloft 200 mg daily, Lamictal 150 mg daily and hydroxyzine 100 mg at bedtime.  Follow up in 3 months.  Follow Up Instructions:    I discussed the assessment and treatment plan with the patient. The patient was provided an opportunity to ask questions and all were answered. The patient agreed with the plan and demonstrated an understanding of the instructions.   The patient was advised to call back or seek an in-person evaluation if the symptoms worsen or if the condition fails to improve as anticipated.  I provided 19 minutes of non-face-to-face time during this encounter.   Kathlee Nations, MD

## 2021-01-16 DIAGNOSIS — E118 Type 2 diabetes mellitus with unspecified complications: Secondary | ICD-10-CM | POA: Diagnosis not present

## 2021-01-19 DIAGNOSIS — Z794 Long term (current) use of insulin: Secondary | ICD-10-CM | POA: Diagnosis not present

## 2021-01-19 DIAGNOSIS — E118 Type 2 diabetes mellitus with unspecified complications: Secondary | ICD-10-CM | POA: Diagnosis not present

## 2021-01-21 DIAGNOSIS — G4733 Obstructive sleep apnea (adult) (pediatric): Secondary | ICD-10-CM | POA: Diagnosis not present

## 2021-02-03 DIAGNOSIS — H18831 Recurrent erosion of cornea, right eye: Secondary | ICD-10-CM | POA: Diagnosis not present

## 2021-03-25 ENCOUNTER — Ambulatory Visit (INDEPENDENT_AMBULATORY_CARE_PROVIDER_SITE_OTHER)
Admission: RE | Admit: 2021-03-25 | Discharge: 2021-03-25 | Disposition: A | Discharge status: Home / Self Care | Payer: BC Managed Care – PPO | Source: Ambulatory Visit | Attending: Family Medicine | Admitting: Family Medicine

## 2021-03-25 ENCOUNTER — Encounter: Payer: Self-pay | Admitting: Internal Medicine

## 2021-03-25 ENCOUNTER — Encounter: Payer: Self-pay | Admitting: Family Medicine

## 2021-03-25 ENCOUNTER — Ambulatory Visit (INDEPENDENT_AMBULATORY_CARE_PROVIDER_SITE_OTHER): Payer: BC Managed Care – PPO | Admitting: Family Medicine

## 2021-03-25 ENCOUNTER — Other Ambulatory Visit: Payer: Self-pay

## 2021-03-25 VITALS — BP 134/76 | HR 88 | Temp 97.8°F | Ht 74.0 in | Wt 355.4 lb

## 2021-03-25 DIAGNOSIS — E1165 Type 2 diabetes mellitus with hyperglycemia: Secondary | ICD-10-CM | POA: Diagnosis not present

## 2021-03-25 DIAGNOSIS — J069 Acute upper respiratory infection, unspecified: Secondary | ICD-10-CM

## 2021-03-25 DIAGNOSIS — R066 Hiccough: Secondary | ICD-10-CM

## 2021-03-25 DIAGNOSIS — Z794 Long term (current) use of insulin: Secondary | ICD-10-CM | POA: Diagnosis not present

## 2021-03-25 DIAGNOSIS — R059 Cough, unspecified: Secondary | ICD-10-CM | POA: Diagnosis not present

## 2021-03-25 DIAGNOSIS — K5900 Constipation, unspecified: Secondary | ICD-10-CM

## 2021-03-25 DIAGNOSIS — Z23 Encounter for immunization: Secondary | ICD-10-CM

## 2021-03-25 LAB — COMPREHENSIVE METABOLIC PANEL
ALT: 23 U/L (ref 0–53)
AST: 24 U/L (ref 0–37)
Albumin: 4.5 g/dL (ref 3.5–5.2)
Alkaline Phosphatase: 91 U/L (ref 39–117)
BUN: 12 mg/dL (ref 6–23)
CO2: 25 mEq/L (ref 19–32)
Calcium: 9.4 mg/dL (ref 8.4–10.5)
Chloride: 102 mEq/L (ref 96–112)
Creatinine, Ser: 0.91 mg/dL (ref 0.40–1.50)
GFR: 91.86 mL/min (ref >60.00)
Glucose, Bld: 101 mg/dL — ABNORMAL HIGH (ref 70–99)
Potassium: 4 mEq/L (ref 3.5–5.1)
Sodium: 137 mEq/L (ref 135–145)
Total Bilirubin: 0.6 mg/dL (ref 0.2–1.2)
Total Protein: 7.1 g/dL (ref 6.0–8.3)

## 2021-03-25 LAB — LIPID PANEL
Cholesterol: 141 mg/dL (ref 0–200)
HDL: 41.1 mg/dL (ref >39.00)
LDL Cholesterol: 76 mg/dL (ref 0–99)
NonHDL: 99.55
Total CHOL/HDL Ratio: 3
Triglycerides: 119 mg/dL (ref 0.0–149.0)
VLDL: 23.8 mg/dL (ref 0.0–40.0)

## 2021-03-25 LAB — HEMOGLOBIN A1C: Hgb A1c MFr Bld: 6.9 % — ABNORMAL HIGH (ref 4.6–6.5)

## 2021-03-25 MED ORDER — OMEPRAZOLE 20 MG PO CPDR: 20.0000 mg | DELAYED_RELEASE_CAPSULE | Freq: Every day | ORAL | 1 refills | Status: DC

## 2021-03-25 NOTE — Patient Instructions (Addendum)
Start omeprazole, have chest xray for hiccups at [facility].  I will refer you to ENT as well. Can recheck next week, and possibly different med at that time if no relief in hiccups.   See info on constipation below. Miralax for now is ok - up to daily if needed.   Keep follow up as planned next week and can review labs.   Return to the clinic or go to the nearest emergency room if any of your symptoms worsen or new symptoms occur.   Hiccups A hiccup is the result of a sudden irritation of a muscle that is used for breathing (diaphragm). The diaphragm is located under your lungs and above your stomach. When the diaphragm gets irritated, it may quickly tighten without your control (have a spasm). The spasm causes you to quickly suck in air, and that causes your vocal cords to close together quickly. These reactions cause the hiccup sound. Hiccups usually last only a short amount of time (less than 48 hours). In unusual cases, they can last for days or months and require you to see your health care provider. Common causes of hiccups include: Eating too fast or eating too much food. Drinking alcohol or bubbly (carbonated) drinks. Eating or drinking hot or spicy foods and drinks. Swallowing extra air when sucking on candy or a straw or when chewing on gum. Feeling nervous, stressed, or excited. Having certain conditions that irritate the diaphragm nerves. Having metabolic or nervous system disorders. Follow these instructions at home: To prevent hiccups or lessen discomfort from hiccups: Eat and chew your food slowly. Eat small meals, and avoid overeating. If you drink alcohol: Limit how much you have to: 0-1 drink a day for women who are not pregnant. 0-2 drinks a day for men. Know how much alcohol is in a drink. In the [place] one drink equals one 12 oz bottle of beer (355 mL), one 5 oz glass of wine (148 mL), or one 1 oz glass of hard liquor (44 mL). Limit your drinking of carbonated  or fizzy drinks, such as soda. Avoid eating or drinking hot or spicy foods and drinks. General instructions Watch for any changes in your hiccups. Take over-the-counter and prescription medicines only as told by your health care provider. Contact a health care provider if: Your hiccups last for more than 48 hours. Your hiccups do not improve with treatment. You cannot sleep or eat because of your hiccups. You have unexpected weight loss because of your hiccups. You have numbness, tingling, or weakness. Get help right away if: You have trouble breathing or swallowing. You have severe pain in your abdomen. These symptoms may represent a serious problem that is an emergency. Do not wait to see if the symptoms will go away. Get medical help right away. Call your local emergency services (911 in the [place] Do not drive yourself to the hospital. Summary A hiccup is the result of a sudden irritation of a muscle that is used for breathing (diaphragm). Hiccups can be caused by many things, including eating too fast. Call your health care provider if your hiccups last for more than 48 hours. This information is not intended to replace advice given to you by your health care provider. Make sure you discuss any questions you have with your health care provider. Document Revised: 02/29/2020 Document Reviewed: 02/29/2020 Elsevier Patient Education  2022 Elsevier Inc.   Constipation, Adult Constipation is when a person has fewer than three bowel movements in a  week, has difficulty having a bowel movement, or has stools (feces) that are dry, hard, or larger than normal. Constipation may be caused by an underlying condition. It may become worse with age if a person takes certain medicines and does not take in enough fluids. Follow these instructions at home: Eating and drinking  Eat foods that have a lot of fiber, such as beans, whole grains, and fresh fruits and vegetables. Limit foods that are low in  fiber and high in fat and processed sugars, such as fried or sweet foods. These include french fries, hamburgers, cookies, candies, and soda. Drink enough fluid to keep your urine pale yellow. General instructions Exercise regularly or as told by your health care provider. Try to do 150 minutes of moderate exercise each week. Use the bathroom when you have the urge to go. Do not hold it in. Take over-the-counter and prescription medicines only as told by your health care provider. This includes any fiber supplements. During bowel movements: Practice deep breathing while relaxing the lower abdomen. Practice pelvic floor relaxation. Watch your condition for any changes. Let your health care provider know about them. Keep all follow-up visits as told by your health care provider. This is important. Contact a health care provider if: You have pain that gets worse. You have a fever. You do not have a bowel movement after 4 days. You vomit. You are not hungry or you lose weight. You are bleeding from the opening between the buttocks (anus). You have thin, pencil-like stools. Get help right away if: You have a fever and your symptoms suddenly get worse. You leak stool or have blood in your stool. Your abdomen is bloated. You have severe pain in your abdomen. You feel dizzy or you faint. Summary Constipation is when a person has fewer than three bowel movements in a week, has difficulty having a bowel movement, or has stools (feces) that are dry, hard, or larger than normal. Eat foods that have a lot of fiber, such as beans, whole grains, and fresh fruits and vegetables. Drink enough fluid to keep your urine pale yellow. Take over-the-counter and prescription medicines only as told by your health care provider. This includes any fiber supplements. This information is not intended to replace advice given to you by your health care provider. Make sure you discuss any questions you have with your  health care provider. Document Revised: 05/16/2019 Document Reviewed: 05/16/2019 Elsevier Patient Education  2022 [employer].

## 2021-03-25 NOTE — Progress Notes (Signed)
Subjective:  Patient ID: Matthew Hutchinson, male    DOB: [birth year]  Age: 60 y.o. MRN: [id number]  CC:  Chief Complaint  Patient presents with   Abdominal Pain    Pt reports hiccups intermittent over several months, has been very frequent and now painful since last OV discussed.    Immunizations    Pt would like Shingles vaccine and flu shot would    Diabetes    Pt reports due for 3 month recheck next week but would like to do today time permitting    Cough    Pt notes after last visit he had gotten sick and still has a residual cough/need to clear his throat not associated with hiccups     HPI Matthew Hutchinson presents for   Diabetes: Complicated by hyperglycemia. Treated with Trulicity, now on 3 mg weekly, metformin 500 mg twice daily, Lantus 62 units daily.  He is on a statin.  Referral to nutritionist previously to discuss diet. Microalbumin: Ordered last visit, not performed. Optho, foot exam, pneumovax: Up-to-date Home readings:  Fasting average 158 past week.  No symptomatic lows. Only 1 reading above 200   Lab Results  Component Value Date   HGBA1C 7.3 (H) 12/10/2020   HGBA1C 7.7 (H) 08/28/2020   HGBA1C 7.4 (H) 05/28/2020   Lab Results  Component Value Date   MICROALBUR 0.6 06/10/2016   LDLCALC 78 08/28/2020   CREATININE 0.97 12/10/2020   Hiccups See previous visits, discussed in February then again in June.  Did report a gurgling noise in his left ear but no change in hearing.  Thought to have component of eustachian tube dysfunction and prescribed Astelin nasal spray, refer to ENT.  Had not been able to connect with ENT as of his last visit in June.  Still intermittent episodes of hiccups throughout the day, no change with Astelin nasal spray.  Symptoms were less severe at his June visit.  Option of baclofen if persistent or ENT/GI eval if frequency increased.  Was treated for respiratory illness after his last visit, suspected viral URI, treated with  Tessalon Perles, Coricidin HBP, other symptomatic care including salt water gargles and Chloraseptic spray.  Some persistent cough, with associated hiccups. Wakes up with them and notes at night.episodes, sometimes few then up to 100. Some soreness in abdominal muscles d/t hiccup. Not associated with any activity or food. Some persistent dry cough, feeling of phlegm sensation in throat. Usually nonproductive. No new dyspnea, no fever. No relief with tessalon prior.  No heartburn. OTC prilosec in past without help for apnea? No recent PPI.   Constipation Some constipation past few months. OTC stool softener some days intermittently. No miralax tried. Usually regular.   HM: Flu and shingles vaccine requested today.    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   WEIGHT GAIN, ABNORMAL 04/17/2010   Past Medical History:  Diagnosis Date   Allergy    Anxiety    Arthritis    COPD (chronic obstructive pulmonary disease) ([place])    Depression    Emphysema of lung (HCC)    Hyperlipidemia  Morbid obesity (HCC)    OSA (obstructive sleep apnea)    uses CPAP nightly   PONV (postoperative nausea and vomiting)    Unspecified essential hypertension    Past Surgical History:  Procedure Laterality Date   INSERTION OF MESH N/A 09/18/2015   Procedure: INSERTION OF MESH;  Surgeon: Coralie Keens, MD;  Location: [facility];  Service: General;  Laterality: N/A;   LEG SURGERY Left    SHOULDER ARTHROSCOPY Left    TONSILLECTOMY     UMBILICAL HERNIA REPAIR N/A 09/18/2015   Procedure:  HERNIA REPAIR UMBILICAL ADULT WITH MESH ;  Surgeon: Coralie Keens, MD;  Location: [facility];  Service: General;  Laterality: N/A;   Allergies  Allergen Reactions   Penicillins Anaphylaxis    REACTION: anaphylaxis   Valsartan     REACTION: itch/swelling   Prior to Admission medications   Medication Sig Start Date End Date Taking? Authorizing Provider  amLODipine (NORVASC) 5 MG tablet Take 1 tablet (5 mg total) by mouth daily. 12/10/20  Yes Wendie Agreste, MD  aspirin 81 MG tablet Take 1 tablet (81 mg total) by mouth daily. 11/13/12  Yes Patrecia Pour, NP  b complex vitamins tablet Take 1 tablet by mouth daily.   Yes [provider]  blood glucose meter kit and supplies Dispense based on patient and insurance preference. Use 2-3 times per day. 05/09/18  Yes Wendie Agreste, MD  calcium gluconate 500 MG tablet Take 500 mg by mouth daily.   Yes [provider]  Carboxymethylcellulose Sodium (EYE DROPS OP) Apply to eye. Prednisone acetate USP 1% BID   Yes [provider]  Cholecalciferol (VITAMIN D) 2000 UNITS tablet Take 1 tablet (2,000 Units total) by mouth daily. 11/13/12  Yes Lord, Asa Saunas, NP  Dulaglutide (TRULICITY) 3 ZY/6.0YT SOPN Inject 3 mg as directed once a week. 12/25/20  Yes Wendie Agreste, MD  gabapentin (NEURONTIN) 300 MG capsule Take 2 capsules (600 mg total) by mouth 2 (two) times daily. 12/10/20  Yes Wendie Agreste, MD  hydrOXYzine (VISTARIL) 50 MG capsule TAKE 2 CAPSULES BY MOUTH AT BEDTIME 01/15/21  Yes Arfeen, Arlyce Harman, MD  insulin glargine (LANTUS SOLOSTAR) 100 UNIT/ML Solostar Pen Inject 50 Units into the skin daily. Pharm req 30 day supply instead of 90. 05/02/20  Yes Wendie Agreste, MD  Insulin Pen Needle (TECHLITE PEN NEEDLES) 32G X 4 MM MISC Use once daily with lantus. Dx E11.9, Z79.4 04/14/20  Yes Jacelyn Pi, Lilia Argue, MD  lamoTRIgine (LAMICTAL) 150 MG tablet Take 1 tablet (150 mg total) by mouth daily. 01/15/21  Yes  Arfeen, Arlyce Harman, MD  losartan-hydrochlorothiazide (HYZAAR) 100-25 MG tablet Take 1 tablet by mouth daily. 12/10/20  Yes Wendie Agreste, MD  Lutein 40 MG CAPS Take 1 capsule by mouth daily.   Yes [provider]  magnesium gluconate (MAGONATE) 500 MG tablet Take 500 mg by mouth daily.   Yes [provider]  metFORMIN (GLUCOPHAGE) 500 MG tablet Take 1 tablet (500 mg total) by mouth 2 (two) times daily with a meal. 12/10/20  Yes Wendie Agreste, MD  Multiple Vitamin (MULTIVITAMIN) capsule Take 1 capsule by mouth daily. 11/13/12  Yes Lord, Asa Saunas, NP  nitroGLYCERIN (NITROSTAT) 0.4 MG SL tablet Place 1 tablet (0.4 mg total) under the tongue every 5 (five) minutes as needed for chest pain. If chest pain not resolved, after 3 doses 5 minutes apart--call 911 11/13/12  Yes Waylan Boga  Y, NP  Potassium 99 MG TABS Take 1 tablet by mouth daily.   Yes [provider]  sertraline (ZOLOFT) 100 MG tablet TAKE 1 TABLET BY MOUTH DAILY 01/15/21  Yes Arfeen, Arlyce Harman, MD  Shark Cartilage 740 MG CAPS Take 1 capsule by mouth 3 (three) times daily.   Yes [provider]  simvastatin (ZOCOR) 20 MG tablet TAKE 1 TABLET BY MOUTH AT BEDTIME, 10/06/20  Yes Maximiano Coss, NP  simvastatin (ZOCOR) 20 MG tablet Take 1 tablet (20 mg total) by mouth at bedtime. 12/10/20  Yes Wendie Agreste, MD  traMADol (ULTRAM) 50 MG tablet Take 50 mg by mouth every 6 (six) hours as needed for pain.   Yes [provider]  traZODone (DESYREL) 100 MG tablet TAKE 1 BY MOUTH AT BEDTIME 04/15/20  Yes Arfeen, Arlyce Harman, MD  azelastine (ASTELIN) 0.1 % nasal spray  06/29/18   [provider]  benzonatate (TESSALON) 100 MG capsule Take 1-2 capsules (100-200 mg total) by mouth 3 (three) times daily as needed. Patient not taking: Reported on 03/25/2021 12/15/20   Noe Gens, PA-C  Dulaglutide (TRULICITY) 1.5 SP/2.3RA SOPN Inject 1.5 mg into the skin once a week. Patient not taking: Reported on 03/25/2021  12/10/20   Wendie Agreste, MD  oxyCODONE (OXY IR/ROXICODONE) 5 MG immediate release tablet Take by mouth. Patient not taking: Reported on 03/25/2021 05/26/20   [provider]  PLENVU 140 g SOLR See admin instructions. 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 Education: college. Exercise: No.   Social Determinants of Health   Financial Resource Strain: Not on file  Food Insecurity: Not on file  Transportation Needs: Not on file  Physical Activity: Not on file  Stress: Not on file  Social Connections: Not on file  Intimate Partner Violence: Not on file    Review of Systems   Objective:   Vitals:   03/25/21 1341  BP: 134/76  Pulse: 88  Temp: 97.8 F (36.6 C)  TempSrc: Temporal  SpO2: 95%  Weight: (!) 355 lb 6.4 oz (161.2 kg)  Height: _0  (1.88 m)     Physical Exam Vitals reviewed.  Constitutional:      Appearance: He is well-developed. He is obese. He is not ill-appearing or diaphoretic.  HENT:     Head: Normocephalic and atraumatic.  Neck:     Vascular: No carotid bruit or JVD.  Cardiovascular:  Rate and Rhythm: Normal  rate and regular rhythm.     Heart sounds: Normal heart sounds. No murmur heard. Pulmonary:     Effort: Pulmonary effort is normal.     Breath sounds: Normal breath sounds. No wheezing, rhonchi or rales.  Abdominal:     General: Bowel sounds are normal. There is no distension.     Tenderness: There is abdominal tenderness (min ttp over abd wall musculature. no rebound/guarding.).  Musculoskeletal:     Right lower leg: No edema.     Left lower leg: No edema.  Skin:    General: Skin is warm and dry.  Neurological:     Mental Status: He is alert and oriented to person, place, and time.  Psychiatric:        Mood and Affect: Mood normal.       Assessment & Plan:  Matthew Hutchinson is a 60 y.o. male . Hiccups - Plan: omeprazole (PRILOSEC) 20 MG capsule, Ambulatory referral to ENT, DG Chest 2 View Upper respiratory infection with cough and congestion - Plan: DG Chest 2 View  -Episodic but persistent hiccups with some recent worsening, potentially an increased cough with recent respiratory illness.  Some persistent but nonproductive cough.  Sensation in back of the throat.  Differential includes silent reflux which may also be contributing to hiccups.  -Check chest x-ray with persisting cough and to evaluate lung bases.  Start omeprazole for possible reflux, and refer to ENT.  Has follow-up next week, consider baclofen if not improving  Need for shingles vaccine - Plan: Varicella-zoster vaccine IM  Need for influenza vaccination - Plan: Flu Vaccine QUAD 6+ mos PF IM (Fluarix Quad PF)  Constipation, unspecified constipation type  -Handout given, trial of MiraLAX over-the-counter.  Recheck next week  Type 2 diabetes mellitus with hyperglycemia, with long-term current use of insulin (HCC) - Plan: Comprehensive metabolic panel, Hemoglobin A1c, Lipid panel, Microalbumin / creatinine urine ratio  -Tolerating current regimen without hypoglycemia, check A1c, urine microalbumin, follow-up as  planned next week to review any potential med changes.  Meds ordered this encounter  Medications   omeprazole (PRILOSEC) 20 MG capsule    Sig: Take 1 capsule (20 mg total) by mouth daily.    Dispense:  30 capsule    Refill:  1   Patient Instructions  Start omeprazole, have chest xray for hiccups at [facility].  I will refer you to ENT as well. Can recheck next week, and possibly different med at that time if no relief in hiccups.   See info on constipation below. Miralax for now is ok - up to daily if needed.   Keep follow up as planned next week and can review labs.   Return to the clinic or go to the nearest emergency room if any of your symptoms worsen or new symptoms occur.   Hiccups A hiccup is the result of a sudden irritation of a muscle that is used for breathing (diaphragm). The diaphragm is located under your lungs and above your stomach. When the diaphragm gets irritated, it may quickly tighten without your control (have a spasm). The spasm causes you to quickly suck in air, and that causes your vocal cords to close together quickly. These reactions cause the hiccup sound. Hiccups usually last only a short amount of time (less than 48 hours). In unusual cases, they can last for days or months and require you to see your health care provider. Common causes of hiccups include: Eating  too fast or eating too much food. Drinking alcohol or bubbly (carbonated) drinks. Eating or drinking hot or spicy foods and drinks. Swallowing extra air when sucking on candy or a straw or when chewing on gum. Feeling nervous, stressed, or excited. Having certain conditions that irritate the diaphragm nerves. Having metabolic or nervous system disorders. Follow these instructions at home: To prevent hiccups or lessen discomfort from hiccups: Eat and chew your food slowly. Eat small meals, and avoid overeating. If you drink alcohol: Limit how much you have to: 0-1 drink a day for women  who are not pregnant. 0-2 drinks a day for men. Know how much alcohol is in a drink. In the [place] one drink equals one 12 oz bottle of beer (355 mL), one 5 oz glass of wine (148 mL), or one 1 oz glass of hard liquor (44 mL). Limit your drinking of carbonated or fizzy drinks, such as soda. Avoid eating or drinking hot or spicy foods and drinks. General instructions Watch for any changes in your hiccups. Take over-the-counter and prescription medicines only as told by your health care provider. Contact a health care provider if: Your hiccups last for more than 48 hours. Your hiccups do not improve with treatment. You cannot sleep or eat because of your hiccups. You have unexpected weight loss because of your hiccups. You have numbness, tingling, or weakness. Get help right away if: You have trouble breathing or swallowing. You have severe pain in your abdomen. These symptoms may represent a serious problem that is an emergency. Do not wait to see if the symptoms will go away. Get medical help right away. Call your local emergency services (911 in the [place] Do not drive yourself to the hospital. Summary A hiccup is the result of a sudden irritation of a muscle that is used for breathing (diaphragm). Hiccups can be caused by many things, including eating too fast. Call your health care provider if your hiccups last for more than 48 hours. This information is not intended to replace advice given to you by your health care provider. Make sure you discuss any questions you have with your health care provider. Document Revised: 02/29/2020 Document Reviewed: 02/29/2020 Elsevier Patient Education  [place].   Constipation, Adult Constipation is when a person has fewer than three bowel movements in a week, has difficulty having a bowel movement, or has stools (feces) that are dry, hard, or larger than normal. Constipation may be caused by an underlying condition. It may become worse with  age if a person takes certain medicines and does not take in enough fluids. Follow these instructions at home: Eating and drinking  Eat foods that have a lot of fiber, such as beans, whole grains, and fresh fruits and vegetables. Limit foods that are low in fiber and high in fat and processed sugars, such as fried or sweet foods. These include french fries, hamburgers, cookies, candies, and soda. Drink enough fluid to keep your urine pale yellow. General instructions Exercise regularly or as told by your health care provider. Try to do 150 minutes of moderate exercise each week. Use the bathroom when you have the urge to go. Do not hold it in. Take over-the-counter and prescription medicines only as told by your health care provider. This includes any fiber supplements. During bowel movements: Practice deep breathing while relaxing the lower abdomen. Practice pelvic floor relaxation. Watch your condition for any changes. Let your health care provider know about them. Keep all follow-up visits  as told by your health care provider. This is important. Contact a health care provider if: You have pain that gets worse. You have a fever. You do not have a bowel movement after 4 days. You vomit. You are not hungry or you lose weight. You are bleeding from the opening between the buttocks (anus). You have thin, pencil-like stools. Get help right away if: You have a fever and your symptoms suddenly get worse. You leak stool or have blood in your stool. Your abdomen is bloated. You have severe pain in your abdomen. You feel dizzy or you faint. Summary Constipation is when a person has fewer than three bowel movements in a week, has difficulty having a bowel movement, or has stools (feces) that are dry, hard, or larger than normal. Eat foods that have a lot of fiber, such as beans, whole grains, and fresh fruits and vegetables. Drink enough fluid to keep your urine pale yellow. Take  over-the-counter and prescription medicines only as told by your health care provider. This includes any fiber supplements. This information is not intended to replace advice given to you by your health care provider. Make sure you discuss any questions you have with your health care provider. Document Revised: 05/16/2019 Document Reviewed: 05/16/2019 Elsevier Patient Education  2022 [place],   Merri Ray, MD [place] 03/25/21 2:45 PM

## 2021-03-27 NOTE — Progress Notes (Signed)
HPI M former smoker followed for OSA, complicated by Morbid Obesity, HBP, CAD, COPD, DM2, Depression, Chronic Pain NPSG 06/08/09 AHI 14.8/ hr, desaturation to 83%, body weight 298 lbs Spirometry 10/16/12- moderate restriction with mild obstruction likely --------------------------------------------------------------------------------   03/27/20- 59 yoM former smoker followed for OSA, complicated by Morbid Obesity, HBP, CAD, COPD, DM2, Depression, Chronic Pain CPAP  auto 5-20   / [employer] Dreamstation Auto  Download- compliance 100%, AHI 2.2/ hr Body weight today- 336 lbs Covid vax- 2 Phizer -----1 year follow up---using the cpap with no issues.  does need refills of cpap supplies Pending flu vax tomorrow and plans Covid booster Doing well with CPAP. Breathing stable, using singulair, Arnuity No recent exacerbation. Pending f/u echo for known murmur.  //Consider full PFT for documentation//  03/30/21- 60 yoM former smoker followed for OSA, complicated by Morbid Obesity, HBP, CAD, COPD, DM2, Depression, Chronic Pain CPAP  auto 5-20   / [employer] Dreamstation Auto  Download-compliance 97%, AHI 1.8/ hr Body weight today-355 lbs Covid vax-2 Phizer, 1 Moderna Flu vax- had Awaiting replacement DreamStation soon. Had a couple of very mild bronchitis episodes but overall breathing is stable.  Chronic hiccups have returned.  CXR 03/25/21- IMPRESSION: No active cardiopulmonary disease.   ROS-see HPI   + = positive Constitutional:    weight loss, night sweats, fevers, chills, fatigue, lassitude. HEENT:    +headaches, difficulty swallowing, tooth/dental problems, sore throat,       sneezing, itching, ear ache, nasal congestion, post nasal drip, snoring CV:    +chest pain, orthopnea, PND, swelling in lower extremities, anasarca,                                   dizziness, palpitations Resp:  + shortness of breath with exertion or at rest.                productive cough,   +non-productive cough,  coughing up of blood.              change in color of mucus.  wheezing.   Skin:    rash or lesions. GI:  No-   heartburn, indigestion, abdominal pain, nausea, vomiting, diarrhea,                 change in bowel habits, loss of appetite GU: dysuria, change in color of urine, no urgency or frequency.   flank pain. MS:   +joint pain, stiffness, decreased range of motion, back pain. Neuro-     nothing unusual Psych:  change in mood or affect.  +depression or anxiety.   memory loss.  OBJ- Physical Exam, + morbidly obese General- Alert, Oriented, Affect-appropriate, Distress- none acute Skin- rash-none, lesions- none, excoriation- none Lymphadenopathy- none Head- atraumatic            Eyes- Gross vision intact, PERRLA, conjunctivae and secretions clear            Ears- Hearing, canals-normal            Nose- Clear, no-Septal dev, mucus, polyps, erosion, perforation             Throat- Mallampati IV , mucosa clear , drainage- none, tonsils- atrophic Neck- flexible , trachea midline, no stridor , thyroid nl, carotid no bruit Chest - symmetrical excursion , unlabored           Heart/CV- RRR ,  murmur + 1-2/6 S, no gallop  ,  no rub, nl s1 s2                           - JVD- none , edema+1, stasis changes- none, varices- none           Lung- clear to P&A, wheeze- none, cough- none , dullness-none, rub- none           Chest wall-  Abd-  Br/ Gen/ Rectal- Not done, not indicated Extrem- cyanosis- none, clubbing, none, atrophy- none, strength- nl Neuro- grossly intact to observation

## 2021-03-30 ENCOUNTER — Encounter: Payer: Self-pay | Admitting: Internal Medicine

## 2021-03-30 ENCOUNTER — Ambulatory Visit (INDEPENDENT_AMBULATORY_CARE_PROVIDER_SITE_OTHER): Payer: BC Managed Care – PPO | Admitting: Internal Medicine

## 2021-03-30 ENCOUNTER — Other Ambulatory Visit: Payer: Self-pay

## 2021-03-30 DIAGNOSIS — G4733 Obstructive sleep apnea (adult) (pediatric): Secondary | ICD-10-CM | POA: Diagnosis not present

## 2021-03-30 DIAGNOSIS — J449 Chronic obstructive pulmonary disease, unspecified: Secondary | ICD-10-CM | POA: Diagnosis not present

## 2021-03-30 NOTE — Patient Instructions (Signed)
Hope [employer] can get your CPAP machine replaced replaced soon.  Please call if we can help

## 2021-04-02 ENCOUNTER — Ambulatory Visit (INDEPENDENT_AMBULATORY_CARE_PROVIDER_SITE_OTHER): Payer: BC Managed Care – PPO | Admitting: Family Medicine

## 2021-04-02 ENCOUNTER — Encounter: Payer: Self-pay | Admitting: Family Medicine

## 2021-04-02 ENCOUNTER — Other Ambulatory Visit: Payer: Self-pay

## 2021-04-02 VITALS — BP 128/74 | HR 84 | Temp 98.1°F | Resp 16 | Ht 74.0 in | Wt 356.8 lb

## 2021-04-02 DIAGNOSIS — K5900 Constipation, unspecified: Secondary | ICD-10-CM | POA: Diagnosis not present

## 2021-04-02 DIAGNOSIS — E1165 Type 2 diabetes mellitus with hyperglycemia: Secondary | ICD-10-CM | POA: Diagnosis not present

## 2021-04-02 DIAGNOSIS — R066 Hiccough: Secondary | ICD-10-CM | POA: Diagnosis not present

## 2021-04-02 DIAGNOSIS — Z794 Long term (current) use of insulin: Secondary | ICD-10-CM | POA: Diagnosis not present

## 2021-04-02 MED ORDER — BACLOFEN 5 MG PO TABS: 5.0000 mg | ORAL_TABLET | Freq: Three times a day (TID) | ORAL | 0 refills | Status: DC | PRN

## 2021-04-02 NOTE — Patient Instructions (Addendum)
Dr. Avel Sensor office info  - call for appointment for hiccups.  Su Philomena Doheny, MD, PA Address: [address], [place], [place] [address] Phone: [phone] Baclofen if needed for continued hiccups temporarily. If you need that med, then will need to slowly taper off.   If no relief of hiccups with the acid blocker over the next week, can stop it.  If you feel like that medicine was helping, bring the paperwork by and I can certainly send that off for lower cost omeprazole.

## 2021-04-02 NOTE — Progress Notes (Signed)
Subjective:  Patient ID: Matthew Hutchinson, male    DOB: 1960/10/22  Age: 60 y.o. MRN: [id number]  CC:  Chief Complaint  Patient presents with   Constipation    Pt doing better on miralax   Diabetes    Pt due for review and check medications no concerns    Gastroesophageal Reflux    Pt reports prilosec not covered by insurance needs eRX sent to special pharmacy for it to be cheaper, need to print Rx so it can be faxed     HPI Matthew Hutchinson presents for   Constipation: Miralax over the counter. Improved since last visit  Hiccups Discussed last visit.  Initial trial of PPI for possible silent reflux, chest x-ray obtained without concerns.  Referred to ENT.  Prilosec was not covered, has Rx for Good R. Has been on otc omeprazole daily - no change. Still with hiccups, intermittent. Worse yesterday - lasted 1-2 mins. Has not heard from ENT. Referral to Dr. Benjamine Mola.  Some soreness in upper abdomen with hiccups - sore in muscles.   DG Chest 2 View  Result Date: 03/25/2021 CLINICAL DATA:  Cough, hiccups for 4 months EXAM: CHEST - 2 VIEW COMPARISON:  03/20/2008 FINDINGS: The heart size and mediastinal contours are within normal limits. Both lungs are clear. The visualized skeletal structures are unremarkable. IMPRESSION: No active cardiopulmonary disease. Electronically Signed   By: Randa Ngo M.D.   On: 03/25/2021 15:40    Diabetes: With hyperglycemia Treated with Trulicity $RemoveBefore'3mg'DFcqrqGKCfVSr$  per week. Metformin $RemoveBeforeD'500mg'JMCESfzvGrnhnU$  BID, Lantus 62 units/day.  Most recent A1c improved.  Lab Results  Component Value Date   HGBA1C 6.9 (H) 03/25/2021   HGBA1C 7.3 (H) 12/10/2020   HGBA1C 7.7 (H) 08/28/2020   Lab Results  Component Value Date   MICROALBUR 0.6 06/10/2016   LDLCALC 76 03/25/2021   CREATININE 0.91 03/25/2021      History Patient Active Problem List   Diagnosis Date Noted   Newly diagnosed diabetes ([place]) 07/27/2018   Morbid obesity ([place]) 02/02/2017   Chronic pain due to trauma  06/25/2013   Depressive disorder, not elsewhere classified 11/09/2012   CAD (coronary artery disease) 02/21/2012   Lipid disorder 02/21/2012   Angina pectoris 09/27/2011   Obstructive sleep apnea 05/20/2010   Essential hypertension 04/17/2010   COPD mixed type ([place]) 04/17/2010   WEIGHT GAIN, ABNORMAL 04/17/2010   Past Medical History:  Diagnosis Date   Allergy    Anxiety    Arthritis    COPD (chronic obstructive pulmonary disease) ([place])    Depression    Emphysema of lung ([place])    Hyperlipidemia    Morbid obesity ([facility])    OSA (obstructive sleep apnea)    uses CPAP nightly   PONV (postoperative nausea and vomiting)    Unspecified essential hypertension    Past Surgical History:  Procedure Laterality Date   INSERTION OF MESH N/A 09/18/2015   Procedure: INSERTION OF MESH;  Surgeon: Coralie Keens, MD;  Location: [facility];  Service: General;  Laterality: N/A;   LEG SURGERY Left    SHOULDER ARTHROSCOPY Left    TONSILLECTOMY     UMBILICAL HERNIA REPAIR N/A 09/18/2015   Procedure: HERNIA REPAIR UMBILICAL ADULT WITH MESH ;  Surgeon: Coralie Keens, MD;  Location: [facility];  Service: General;  Laterality: N/A;   Allergies  Allergen Reactions   Penicillins Anaphylaxis    REACTION: anaphylaxis   Valsartan     REACTION: itch/swelling   Prior  to Admission medications   Medication Sig Start Date End Date Taking? Authorizing Provider  amLODipine (NORVASC) 5 MG tablet Take 1 tablet (5 mg total) by mouth daily. 12/10/20  Yes Wendie Agreste, MD  aspirin 81 MG tablet Take 1 tablet (81 mg total) by mouth daily. 11/13/12  Yes Patrecia Pour, NP  azelastine (ASTELIN) 0.1 % nasal spray  06/29/18  Yes [provider]  b complex vitamins tablet Take 1 tablet by mouth daily.   Yes [provider]  blood glucose meter kit and supplies Dispense based on patient and insurance preference. Use 2-3 times per day. 05/09/18  Yes Wendie Agreste, MD   calcium gluconate 500 MG tablet Take 500 mg by mouth daily.   Yes [provider]  Carboxymethylcellulose Sodium (EYE DROPS OP) Apply to eye. Prednisone acetate USP 1% BID   Yes [provider]  Cholecalciferol (VITAMIN D) 2000 UNITS tablet Take 1 tablet (2,000 Units total) by mouth daily. 11/13/12  Yes Lord, Asa Saunas, NP  Dulaglutide (TRULICITY) 3 IZ/1.2WP SOPN Inject 3 mg as directed once a week. 12/25/20  Yes Wendie Agreste, MD  gabapentin (NEURONTIN) 300 MG capsule Take 2 capsules (600 mg total) by mouth 2 (two) times daily. 12/10/20  Yes Wendie Agreste, MD  hydrOXYzine (VISTARIL) 50 MG capsule TAKE 2 CAPSULES BY MOUTH AT BEDTIME 01/15/21  Yes Arfeen, Arlyce Harman, MD  insulin glargine (LANTUS SOLOSTAR) 100 UNIT/ML Solostar Pen Inject 50 Units into the skin daily. Pharm req 30 day supply instead of 90. 05/02/20  Yes Wendie Agreste, MD  Insulin Pen Needle (TECHLITE PEN NEEDLES) 32G X 4 MM MISC Use once daily with lantus. Dx E11.9, Z79.4 04/14/20  Yes Jacelyn Pi, Lilia Argue, MD  lamoTRIgine (LAMICTAL) 150 MG tablet Take 1 tablet (150 mg total) by mouth daily. 01/15/21  Yes Arfeen, Arlyce Harman, MD  losartan-hydrochlorothiazide (HYZAAR) 100-25 MG tablet Take 1 tablet by mouth daily. 12/10/20  Yes Wendie Agreste, MD  Lutein 40 MG CAPS Take 1 capsule by mouth daily.   Yes [provider]  magnesium gluconate (MAGONATE) 500 MG tablet Take 500 mg by mouth daily.   Yes [provider]  metFORMIN (GLUCOPHAGE) 500 MG tablet Take 1 tablet (500 mg total) by mouth 2 (two) times daily with a meal. 12/10/20  Yes Wendie Agreste, MD  Multiple Vitamin (MULTIVITAMIN) capsule Take 1 capsule by mouth daily. 11/13/12  Yes Lord, Asa Saunas, NP  nitroGLYCERIN (NITROSTAT) 0.4 MG SL tablet Place 1 tablet (0.4 mg total) under the tongue every 5 (five) minutes as needed for chest pain. If chest pain not resolved, after 3 doses 5 minutes apart--call 911 11/13/12  Yes Lord, Asa Saunas, NP  omeprazole  (PRILOSEC) 20 MG capsule Take 1 capsule (20 mg total) by mouth daily. 03/25/21  Yes Wendie Agreste, MD  oxyCODONE (OXY IR/ROXICODONE) 5 MG immediate release tablet Take by mouth. 05/26/20  Yes [provider]  PLENVU 140 g SOLR See admin instructions. 07/21/18  Yes [provider]  Potassium 99 MG TABS Take 1 tablet by mouth daily.   Yes [provider]  sertraline (ZOLOFT) 100 MG tablet TAKE 1 TABLET BY MOUTH DAILY 01/15/21  Yes Arfeen, Arlyce Harman, MD  Shark Cartilage 740 MG CAPS Take 1 capsule by mouth 3 (three) times daily.   Yes [provider]  simvastatin (ZOCOR) 20 MG tablet Take 1 tablet (20 mg total) by mouth at bedtime. 12/10/20  Yes Carlota Raspberry,  Ranell Patrick, MD  traMADol (ULTRAM) 50 MG tablet Take 50 mg by mouth every 6 (six) hours as needed for pain.   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 Education: college. Exercise: No.   Social Determinants of Health   Financial Resource Strain: Not  on file  Food Insecurity: Not on file  Transportation Needs: Not on file  Physical Activity: Not on file  Stress: Not on file  Social Connections: Not on file  Intimate Partner Violence: Not on file    Review of Systems Per HPI.   Objective:   Vitals:   04/02/21 1346  BP: 128/74  Pulse: 84  Resp: 16  Temp: 98.1 F (36.7 C)  TempSrc: Temporal  SpO2: 97%  Weight: (!) 356 lb 12.8 oz (161.8 kg)  Height: $Remove'6\' 2"'vCXLGGn$  (1.88 m)     Physical Exam Vitals reviewed.  Constitutional:      Appearance: He is well-developed.  HENT:     Head: Normocephalic and atraumatic.  Neck:     Vascular: No carotid bruit or JVD.  Cardiovascular:     Rate and Rhythm: Normal rate and regular rhythm.     Heart sounds: Normal heart sounds. No murmur heard. Pulmonary:     Effort: Pulmonary effort is normal.     Breath sounds: Normal breath sounds. No rales.  Abdominal:     Tenderness: There is abdominal tenderness (minimal upper abdomen over musculature.). There is no guarding or rebound.  Musculoskeletal:     Right lower leg: No edema.     Left lower leg: No edema.  Skin:    General: Skin is warm and dry.  Neurological:     Mental Status: He is alert and oriented to person, place, and time.  Psychiatric:        Mood and Affect: Mood normal.       Assessment & Plan:  Matthew Hutchinson is a 60 y.o. male . Hiccups - Plan: Baclofen 5 MG TABS  -Persistent but intermittent symptoms.  No significant change with PPI.  Upper abdominal soreness likely abdominal wall discomfort with recurrent hiccups.   -Keep follow-up with ENT, provided contact information  -Trial of low-dose baclofen, potential side effects discussed, if persistent use and plan to discontinue would need  to taper.  If abdominal soreness is unimproved with cessation of hiccups or worsening symptoms, RTC precautions discussed.  Type 2 diabetes mellitus with hyperglycemia, with long-term current use of insulin (HCC) - Plan:  Microalbumin / creatinine urine ratio  -Improved control on recent labs, no med changes for now, recheck in 3 months.  Constipation, unspecified constipation type  -Improved with MiraLAX, continue same as needed.  RTC precautions if persistent or worsening  Meds ordered this encounter  Medications   Baclofen 5 MG TABS    Sig: Take 5 mg by mouth 3 (three) times daily as needed.    Dispense:  90 tablet    Refill:  0   Patient Instructions  Dr. Deeann Saint office info  - call for appointment for hiccups.  Su Raynelle Bring, MD, PA Address: [address], [place], [place] [address] Phone: [phone] Baclofen if needed for continued hiccups temporarily. If you need that med, then will need to slowly taper off.   If no relief of hiccups with the acid blocker over the next week, can stop it.  If you feel like that medicine was helping, bring the paperwork by and I can certainly send that off for lower cost omeprazole.      Signed,   Merri Ray, MD [place] 04/02/21 2:31 PM

## 2021-04-03 LAB — MICROALBUMIN / CREATININE URINE RATIO
Creatinine,U: 95.4 mg/dL
Microalb Creat Ratio: 1.8 mg/g (ref 0.0–30.0)
Microalb, Ur: 1.7 mg/dL (ref 0.0–1.9)

## 2021-04-04 ENCOUNTER — Encounter: Payer: Self-pay | Admitting: Internal Medicine

## 2021-04-04 ENCOUNTER — Other Ambulatory Visit: Payer: Self-pay | Admitting: Family Medicine

## 2021-04-04 DIAGNOSIS — R066 Hiccough: Secondary | ICD-10-CM

## 2021-04-04 NOTE — Assessment & Plan Note (Signed)
Compliant using old machine, although recalled,  and awaiting replacement. Plan- continue auto 5-20

## 2021-04-04 NOTE — Progress Notes (Signed)
See last OV, mychart message.  Referral for recurrent hiccups.  Minimal change with PPI.  Upper abdominal discomfort thought to be muscular from recurrent hiccups.

## 2021-04-04 NOTE — Assessment & Plan Note (Signed)
Very minimal exacerbations resolved easily. Now at baseline. Not using inhalers and doesn't feel need.

## 2021-04-13 ENCOUNTER — Other Ambulatory Visit (HOSPITAL_COMMUNITY): Payer: Self-pay | Admitting: Psychiatry

## 2021-04-13 DIAGNOSIS — F330 Major depressive disorder, recurrent, mild: Secondary | ICD-10-CM

## 2021-04-14 ENCOUNTER — Encounter (HOSPITAL_COMMUNITY): Payer: Self-pay | Admitting: Psychiatry

## 2021-04-14 ENCOUNTER — Other Ambulatory Visit: Payer: Self-pay

## 2021-04-14 ENCOUNTER — Telehealth (HOSPITAL_BASED_OUTPATIENT_CLINIC_OR_DEPARTMENT_OTHER): Payer: BC Managed Care – PPO | Admitting: Psychiatry

## 2021-04-14 DIAGNOSIS — F411 Generalized anxiety disorder: Secondary | ICD-10-CM

## 2021-04-14 DIAGNOSIS — F330 Major depressive disorder, recurrent, mild: Secondary | ICD-10-CM

## 2021-04-14 MED ORDER — SERTRALINE HCL 100 MG PO TABS: ORAL_TABLET | ORAL | 0 refills | Status: DC

## 2021-04-14 MED ORDER — LAMOTRIGINE 150 MG PO TABS: 150.0000 mg | ORAL_TABLET | Freq: Every day | ORAL | 0 refills | Status: DC

## 2021-04-14 MED ORDER — HYDROXYZINE PAMOATE 50 MG PO CAPS: ORAL_CAPSULE | ORAL | 0 refills | Status: DC

## 2021-04-14 MED ORDER — TRAZODONE HCL 100 MG PO TABS: 100.0000 mg | ORAL_TABLET | Freq: Every evening | ORAL | 0 refills | Status: DC | PRN

## 2021-04-14 NOTE — Progress Notes (Signed)
Virtual Visit via Telephone Note  I connected with Matthew Hutchinson on 04/14/21 at  2:20 PM EDT by telephone and verified that I am speaking with the correct person using two identifiers.  Location: Patient: Home Provider: Home office   I discussed the limitations, risks, security and privacy concerns of performing an evaluation and management service by telephone and the availability of in person appointments. I also discussed with the patient that there may be a patient responsible charge related to this service. The patient expressed understanding and agreed to proceed.   History of Present Illness: Patient is evaluated by phone session.  He is very anxious and nervous since yesterday after he find out that their family home is going to be auction in 10 days because his older brother took a loan which he never paid back.  Patient is very upset because his 60 year old mother lives in the house who is blind and does not have a good memory.  He is afraid that she will be hopeless.  Patient admitted that he has nothing to do with the family farm, home or business but he is still care about his mother.  Patient told their family home is more than 60 years old and his family has been living since 60.  Patient find out through the distant relative that houses on sale.  Patient told now they are trying to talk to the lawyer to see if the house and he is not sure if it can be salvageable.  He admitted taking trazodone for past few days so he can sleep better.  He is also using CPAP.  Recently he had a blood work and he is pleased that his hemoglobin A1c is improved but he noticed having hiccups and now he is going to see the GI.  Otherwise he feels things are going okay and he is taking his medication as prescribed.  He admitted since yesterday he is having a lot of anxiety and is using breathing exercise every few minutes to calm him down.  He admitted the only thing that is in his mind is their old  house and his elderly mother.  He denies any paranoia, hallucination but admitted frustrated and upset but denies any suicidal thoughts or homicidal thoughts.  He like to keep his medication since it is working but like to get the refill of the trazodone.  He does not feel he needs a therapist at this time however promised that call us back if he needs more help.  Like to get all his refills including trazodone.  He has no rash, itching tremors or shakes.  Past Psychiatric History:  H/O inpatient at [facility] for overdose on Ambien.  H/O overdose on Ambien 2 other times but did not require inpatient treatment.  H/O of mood swing, impulsive behavior, speeding ticket, anger, road rage.  No history of psychosis or hallucination.  Recent Results (from the past 2160 hour(s))  Comprehensive metabolic panel     Status: Abnormal   Collection Time: 03/25/21  2:42 PM  Result Value Ref Range   Sodium 137 135 - 145 mEq/L   Potassium 4.0 3.5 - 5.1 mEq/L   Chloride 102 96 - 112 mEq/L   CO2 25 19 - 32 mEq/L   Glucose, Bld 101 (H) 70 - 99 mg/dL   BUN 12 6 - 23 mg/dL   Creatinine, Ser 1.54 0.40 - 1.50 mg/dL   Total Bilirubin 0.6 0.2 - 1.2 mg/dL   Alkaline Phosphatase 91 39 -  117 U/L   AST 24 0 - 37 U/L   ALT 23 0 - 53 U/L   Total Protein 7.1 6.0 - 8.3 g/dL   Albumin 4.5 3.5 - 5.2 g/dL   GFR 10.27 >25.36 mL/min    Comment: Calculated using the CKD-EPI Creatinine Equation (2021)   Calcium 9.4 8.4 - 10.5 mg/dL  Hemoglobin U4Q     Status: Abnormal   Collection Time: 03/25/21  2:42 PM  Result Value Ref Range   Hgb A1c MFr Bld 6.9 (H) 4.6 - 6.5 %    Comment: Glycemic Control Guidelines for People with Diabetes:Non Diabetic:  <6%Goal of Therapy: <7%Additional Action Suggested:  >8%   Lipid panel     Status: None   Collection Time: 03/25/21  2:42 PM  Result Value Ref Range   Cholesterol 141 0 - 200 mg/dL    Comment: ATP III Classification       Desirable:  < 200 mg/dL               Borderline High:  200 - 239  mg/dL          High:  > = 034 mg/dL   Triglycerides 742.5 0.0 - 149.0 mg/dL    Comment: Normal:  <956 mg/dLBorderline High:  150 - 199 mg/dL   HDL 38.75 >64.33 mg/dL   VLDL 29.5 0.0 - 18.8 mg/dL   LDL Cholesterol 76 0 - 99 mg/dL   Total CHOL/HDL Ratio 3     Comment:                Men          Women1/2 Average Risk     3.4          3.3Average Risk          5.0          4.42X Average Risk          9.6          7.13X Average Risk          15.0          11.0                       NonHDL 99.55     Comment: NOTE:  Non-HDL goal should be 30 mg/dL higher than patient's LDL goal (i.e. LDL goal of < 70 mg/dL, would have non-HDL goal of < 100 mg/dL)  Microalbumin / creatinine urine ratio     Status: None   Collection Time: 04/02/21  4:38 PM  Result Value Ref Range   Microalb, Ur 1.7 0.0 - 1.9 mg/dL   Creatinine,U 41.6 mg/dL   Microalb Creat Ratio 1.8 0.0 - 30.0 mg/g     Psychiatric Specialty Exam: Physical Exam  Review of Systems  Weight (!) 356 lb (161.5 kg).There is no height or weight on file to calculate BMI.  General Appearance: NA  Eye Contact:  NA  Speech:  Slow  Volume:  Normal  Mood:  Anxious and Depressed  Affect:  NA  Thought Process:  Descriptions of Associations: Intact  Orientation:  Full (Time, Place, and Person)  Thought Content:  Rumination  Suicidal Thoughts:  No  Homicidal Thoughts:  No  Memory:  Immediate;   Good Recent;   Good Remote;   Good  Judgement:  Intact  Insight:  Present  Psychomotor Activity:  NA  Concentration:  Concentration: Good and Attention Span: Good  Recall:  Good  Fund of Knowledge:  Good  Language:  Good  Akathisia:  No  Handed:  Right  AIMS (if indicated):     Assets:  Communication Skills Desire for Improvement Housing Transportation  ADL's:  Intact  Cognition:  WNL  Sleep:         Assessment and Plan: Major depressive disorder, recurrent.  Generalized anxiety disorder.  I reviewed blood work results.  His hemoglobin A1c  improved from the past.  Discussed his family situation and patient understand that if his anxiety gets out of control then he will call us to get some help.  At this time he feels stable and like to keep the current medication including trazodone which he rarely takes but now he has been taking few nights a week.  He has no side effects.  We will continue Lamictal 150 mg daily, hydroxyzine 20 mg at bedtime, Zoloft 100 mg daily and trazodone 100 mg as needed.  Recommended to call us back if is any question or any concern.  Follow-up in 3 months.  Follow Up Instructions:    I discussed the assessment and treatment plan with the patient. The patient was provided an opportunity to ask questions and all were answered. The patient agreed with the plan and demonstrated an understanding of the instructions.   The patient was advised to call back or seek an in-person evaluation if the symptoms worsen or if the condition fails to improve as anticipated.  I provided 25 minutes of non-face-to-face time during this encounter.   Cleotis Nipper, MD

## 2021-05-01 DIAGNOSIS — R609 Edema, unspecified: Secondary | ICD-10-CM | POA: Diagnosis not present

## 2021-05-01 DIAGNOSIS — G4733 Obstructive sleep apnea (adult) (pediatric): Secondary | ICD-10-CM | POA: Diagnosis not present

## 2021-05-01 DIAGNOSIS — S91001A Unspecified open wound, right ankle, initial encounter: Secondary | ICD-10-CM | POA: Diagnosis not present

## 2021-05-01 DIAGNOSIS — E118 Type 2 diabetes mellitus with unspecified complications: Secondary | ICD-10-CM | POA: Diagnosis not present

## 2021-05-01 DIAGNOSIS — Z9181 History of falling: Secondary | ICD-10-CM | POA: Diagnosis not present

## 2021-05-01 DIAGNOSIS — Z794 Long term (current) use of insulin: Secondary | ICD-10-CM | POA: Diagnosis not present

## 2021-05-03 ENCOUNTER — Other Ambulatory Visit: Payer: Self-pay | Admitting: Family Medicine

## 2021-05-03 DIAGNOSIS — E1165 Type 2 diabetes mellitus with hyperglycemia: Secondary | ICD-10-CM

## 2021-05-06 ENCOUNTER — Encounter: Payer: Self-pay | Admitting: Family Medicine

## 2021-05-11 ENCOUNTER — Encounter: Payer: Self-pay | Admitting: Family Medicine

## 2021-05-11 DIAGNOSIS — E1165 Type 2 diabetes mellitus with hyperglycemia: Secondary | ICD-10-CM

## 2021-05-11 DIAGNOSIS — E118 Type 2 diabetes mellitus with unspecified complications: Secondary | ICD-10-CM | POA: Diagnosis not present

## 2021-05-12 DIAGNOSIS — Z961 Presence of intraocular lens: Secondary | ICD-10-CM | POA: Diagnosis not present

## 2021-05-12 DIAGNOSIS — H04123 Dry eye syndrome of bilateral lacrimal glands: Secondary | ICD-10-CM | POA: Diagnosis not present

## 2021-05-12 DIAGNOSIS — H524 Presbyopia: Secondary | ICD-10-CM | POA: Diagnosis not present

## 2021-05-12 DIAGNOSIS — E119 Type 2 diabetes mellitus without complications: Secondary | ICD-10-CM | POA: Diagnosis not present

## 2021-05-12 LAB — HM DIABETES EYE EXAM

## 2021-05-12 MED ORDER — INSULIN GLARGINE 100 UNIT/ML ~~LOC~~ SOLN: 62.0000 [IU] | Freq: Every day | SUBCUTANEOUS | 5 refills | Status: DC

## 2021-05-12 NOTE — Telephone Encounter (Signed)
Semglee ordered. Will also ask referrals to expedite GI eval, given new symptoms on 10/26 message.

## 2021-05-13 ENCOUNTER — Encounter: Payer: Self-pay | Admitting: Physician Assistant

## 2021-05-13 NOTE — Telephone Encounter (Signed)
Pt has been scheduled for 05/28/21

## 2021-05-14 ENCOUNTER — Telehealth: Payer: Self-pay

## 2021-05-14 ENCOUNTER — Other Ambulatory Visit: Payer: Self-pay | Admitting: Family Medicine

## 2021-05-14 DIAGNOSIS — E1165 Type 2 diabetes mellitus with hyperglycemia: Secondary | ICD-10-CM

## 2021-05-14 NOTE — Telephone Encounter (Signed)
Caller name:Abdirizak T Benton-Elliot  Caller callback #:[phone]  Encourage patient to contact the pharmacy for refills or they can request refills through [facility]  (Please schedule appointment if patient has not been seen in over a year)  MEDICATION NAME & DOSE:Semglee Pin   Notes/Comments from patient:Pt was sent insulin and the pharmacy said that Dr Neva Seat would have to write RX for the Pin can this be fixed and resent   WHAT PHARMACY WOULD THEY LIKE THIS SENT TO: [facility]   Please notify patient: It takes 48-72 hours to process rx refill requests Ask patient to call pharmacy to ensure rx is ready before heading there.   (CLINICAL TO FILL OR ROUTE PER PROTOCOLS)

## 2021-05-14 NOTE — Telephone Encounter (Signed)
Error

## 2021-05-15 MED ORDER — INSULIN GLARGINE-YFGN 100 UNIT/ML ~~LOC~~ SOPN: 62.0000 [IU] | PEN_INJECTOR | Freq: Every day | SUBCUTANEOUS | 3 refills | Status: DC

## 2021-05-15 NOTE — Telephone Encounter (Signed)
Please advise 

## 2021-05-15 NOTE — Telephone Encounter (Signed)
Pen Ordered.

## 2021-05-15 NOTE — Telephone Encounter (Signed)
Rx has been sent to pharmacy. Patient is asking for pens. Please advise if correct medication and form was sent correctly.

## 2021-05-28 ENCOUNTER — Ambulatory Visit (INDEPENDENT_AMBULATORY_CARE_PROVIDER_SITE_OTHER): Payer: BC Managed Care – PPO | Admitting: Physician Assistant

## 2021-05-28 ENCOUNTER — Encounter: Payer: Self-pay | Admitting: Physician Assistant

## 2021-05-28 VITALS — BP 140/80 | HR 81 | Ht 74.0 in | Wt 352.0 lb

## 2021-05-28 DIAGNOSIS — R066 Hiccough: Secondary | ICD-10-CM

## 2021-05-28 DIAGNOSIS — K219 Gastro-esophageal reflux disease without esophagitis: Secondary | ICD-10-CM

## 2021-05-28 MED ORDER — BACLOFEN 5 MG PO TABS: 1.0000 | ORAL_TABLET | Freq: Three times a day (TID) | ORAL | 1 refills | Status: DC | PRN

## 2021-05-28 MED ORDER — PANTOPRAZOLE SODIUM 40 MG PO TBEC: 40.0000 mg | DELAYED_RELEASE_TABLET | Freq: Every day | ORAL | 6 refills | Status: DC

## 2021-05-28 NOTE — Progress Notes (Signed)
Subjective:    Patient ID: Matthew Hutchinson, male    DOB: 04/07/1961, 60 y.o.   MRN: [id number]  HPI Matthew Hutchinson is a 60 year old white male, new to GI today referred by Dr. Merri Ray for evaluation of persistent hiccups.  Patient relates that he has previously been a longtime patient of Dr. Benson Norway, and has had several prior colonoscopies.  The last was done in January 2020 and shows sigmoid diverticulosis, otherwise negative exam and was indicated for 10-year interval follow-up. He has not had any prior EGD. Patient has history of hypertension, coronary artery disease, COPD, sleep apnea with CPAP use/no O2, adult onset diabetes mellitus, morbid obesity, chronic pain syndrome, depression and history of asthma.  He is maintained on Roxicodone for chronic pain.  He says he has been having ongoing chronic problems with hiccups over the past 6 months which are present multiple times per day.  He says he gets sore and uncomfortable at his upper abdomen due to the ongoing persistent contractions.  He had a similar episode about 10 years ago which went on for a long period of time intermittently and then gradually resolved. He was recently given a prescription for omeprazole by Dr. Nyoka Cowden and also a trial of baclofen but says that his insurance would not cover either 1 of those.  He was also to be referred to ENT but says when he called their they said that they did not deal with hiccups. He is not aware of having heartburn or indigestion or acid reflux, very occasionally will have an episode of dysphagia within the first few bites of a meal but otherwise no regular problems.  He is unaware of any triggers for the hiccups, they occur randomly throughout the day not necessarily associated with meals or eating or activity. Bowel movements have been normal, occasional constipation, no melena or hematochezia.  He says hiccups will usually have onset within a few minutes of getting up each morning.  Review of  Systems Pertinent positive and negative review of systems were noted in the above HPI section.  All other review of systems was otherwise negative.   Outpatient Encounter Medications as of 05/28/2021  Medication Sig   amLODipine (NORVASC) 5 MG tablet Take 1 tablet (5 mg total) by mouth daily.   aspirin 81 MG tablet Take 1 tablet (81 mg total) by mouth daily.   b complex vitamins tablet Take 1 tablet by mouth daily.   Baclofen 5 MG TABS Take 1 tablet by mouth 3 (three) times daily as needed (spasms).   blood glucose meter kit and supplies Dispense based on patient and insurance preference. Use 2-3 times per day.   calcium gluconate 500 MG tablet Take 500 mg by mouth daily.   Carboxymethylcellulose Sodium (EYE DROPS OP) Apply to eye. Prednisone acetate USP 1% BID   Cholecalciferol (VITAMIN D) 2000 UNITS tablet Take 1 tablet (2,000 Units total) by mouth daily.   Dulaglutide (TRULICITY) 3 EG/3.1DV SOPN Inject 3 mg as directed once a week.   gabapentin (NEURONTIN) 300 MG capsule Take 2 capsules (600 mg total) by mouth 2 (two) times daily.   hydrOXYzine (VISTARIL) 50 MG capsule TAKE 2 CAPSULES BY MOUTH AT BEDTIME   insulin glargine-yfgn (SEMGLEE) 100 UNIT/ML Pen Inject 62 Units into the skin daily.   Insulin Pen Needle (TECHLITE PEN NEEDLES) 32G X 4 MM MISC Use once daily with lantus. Dx E11.9, Z79.4   lamoTRIgine (LAMICTAL) 150 MG tablet Take 1 tablet (150 mg total) by mouth  daily.   losartan-hydrochlorothiazide (HYZAAR) 100-25 MG tablet Take 1 tablet by mouth daily.   Lutein 40 MG CAPS Take 1 capsule by mouth daily.   magnesium gluconate (MAGONATE) 500 MG tablet Take 500 mg by mouth daily.   metFORMIN (GLUCOPHAGE) 500 MG tablet Take 1 tablet (500 mg total) by mouth 2 (two) times daily with a meal.   Multiple Vitamin (MULTIVITAMIN) capsule Take 1 capsule by mouth daily.   nitroGLYCERIN (NITROSTAT) 0.4 MG SL tablet Place 1 tablet (0.4 mg total) under the tongue every 5 (five) minutes as needed for  chest pain. If chest pain not resolved, after 3 doses 5 minutes apart--call 911   pantoprazole (PROTONIX) 40 MG tablet Take 1 tablet (40 mg total) by mouth daily before breakfast.   Potassium 99 MG TABS Take 1 tablet by mouth daily.   sertraline (ZOLOFT) 100 MG tablet TAKE 1 TABLET BY MOUTH DAILY   Shark Cartilage 740 MG CAPS Take 1 capsule by mouth 3 (three) times daily.   simvastatin (ZOCOR) 20 MG tablet Take 1 tablet (20 mg total) by mouth at bedtime.   traMADol (ULTRAM) 50 MG tablet Take 50 mg by mouth every 6 (six) hours as needed for pain.   traZODone (DESYREL) 100 MG tablet Take 1 tablet (100 mg total) by mouth at bedtime as needed for sleep.   [DISCONTINUED] omeprazole (PRILOSEC) 20 MG capsule Take 1 capsule (20 mg total) by mouth daily.   [DISCONTINUED] azelastine (ASTELIN) 0.1 % nasal spray    [DISCONTINUED] Baclofen 5 MG TABS Take 5 mg by mouth 3 (three) times daily as needed.   [DISCONTINUED] oxyCODONE (OXY IR/ROXICODONE) 5 MG immediate release tablet Take by mouth.   No facility-administered encounter medications on file as of 05/28/2021.   Allergies  Allergen Reactions   Penicillins Anaphylaxis    REACTION: anaphylaxis   Valsartan     REACTION: itch/swelling   Patient Active Problem List   Diagnosis Date Noted   Newly diagnosed diabetes ([place]) 07/27/2018   Morbid obesity ([place]) 02/02/2017   Chronic pain due to trauma 06/25/2013   Depressive disorder, not elsewhere classified 11/09/2012   CAD (coronary artery disease) 02/21/2012   Lipid disorder 02/21/2012   Angina pectoris 09/27/2011   Obstructive sleep apnea 05/20/2010   Essential hypertension 04/17/2010   COPD mixed type ([place]) 04/17/2010   WEIGHT GAIN, ABNORMAL 04/17/2010   Social History   Socioeconomic History   Marital status: Married    Spouse name: Not on file   Number of children: 0   Years of education: Not on file   Highest education level: Not on file  Occupational History   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 Education: college. Exercise: No.   Social Determinants of Health   Financial Resource Strain: Not on  file  Food Insecurity: Not on file  Transportation Needs: Not on file  Physical Activity: Not on file  Stress: Not on file  Social Connections: Not on file  Intimate Partner Violence: Not on file    Mr. Muhlestein's family history includes Alcohol abuse in his father and mother; Arthritis in his mother; Breast cancer in his mother; Cancer in his maternal grandmother; Cancer - Other in his father; Emphysema in his paternal grandfather and paternal grandmother; Heart disease in his maternal grandfather; Hyperlipidemia in his brother; Hypertension in his brother; Lung cancer in his paternal uncle; Macular degeneration in his mother; Ovarian cancer in his mother.      Objective:    Vitals:   05/28/21 1501  BP: 140/80  Pulse: 81    Physical Exam Well-developed well-nourished obese older white male in no acute distress.  Height, Weight, 352 BMI 45.1  HEENT; nontraumatic normocephalic, EOMI, PE R LA, sclera anicteric. Oropharynx; not examined today Neck; supple, no JVD Cardiovascular; regular rate and rhythm with S1-S2, no murmur rub or gallop Pulmonary; Clear bilaterally Abdomen; soft, morbidly obese nontender, nondistended, no palpable mass or hepatosplenomegaly, no palpable abdominal wall hernia, bowel sounds are active  umbilical incisional scar Rectal; not done today Skin; benign exam, no jaundice rash or appreciable lesions Extremities; no clubbing cyanosis or edema skin warm and dry Neuro/Psych; alert and oriented x4, grossly nonfocal mood and affect appropriate        Assessment & Plan:   #44 60 year old white male with chronic daily hiccups over the past 6 months.  Similar episode about 10 years ago which lasted for over a year and gradually resolved.  No current associated abdominal pain but does have soreness in the upper abdomen he thinks from persistent contractions of the upper abdominal muscles Unaware of any heartburn or indigestion or acid reflux, very occasional dysphagia  , Etiology of the hiccups is unclear, generally occur with spasm of the diaphragm, rule out secondary to distal esophagitis, GERD  #2 colon cancer screening-up-to-date/colonoscopy per Dr. Benson Norway January 2020, sigmoid diverticulosis no polyps indicated for 10-year interval follow-up  #3 COPD #4  Morbid obesity-BMI 45 #5.  Chronic pain syndrome on chronic opioids #6 diabetes mellitus #7.  Sleep apnea #8.  Coronary artery disease #9.  Hypertension #10.  Depression  Plan; We will schedule for barium swallow Patient is not a good candidate for endoscopic evaluation due to increased risk with sedation and will hope to avoid Start trial of Protonix 40 mg p.o. every morning AC breakfast Start baclofen 5 mg p.o. 3 times daily  As he has been a long-term patient of Dr. Benson Norway, if he wishes to return to Dr. Ulyses Amor care, that is certainly  fine and reasonable.  He would like to see how he does with the above regimen. Patient will be established with Dr. Loletha Carrow if he chooses to continue care here  Matthew Ferguson PA-C 05/28/2021   Cc: Wendie Agreste, MD

## 2021-05-28 NOTE — Patient Instructions (Signed)
If you are age 60 or younger, your body mass index should be between 19-25. Your Body mass index is 45.19 kg/m. If this is out of the aformentioned range listed, please consider follow up with your Primary Care Provider.  ________________________________________________________  The [facility] providers would like to encourage you to use [facility] to communicate with providers for non-urgent requests or questions.  Due to long hold times on the telephone, sending your provider a message by [facility] may be a faster and more efficient way to get a response.  Please allow 48 business hours for a response.  Please remember that this is for non-urgent requests.  _______________________________________________________  Matthew Hutchinson have been scheduled for a Barium Esophogram at [facility] (1st floor of the hospital) on 06/05/2021 at 9:30 am. Please arrive 15 minutes prior to your appointment for registration. Make certain not to have anything to eat or drink 3 hours prior to your test. If you need to reschedule for any reason, please contact radiology at [phone] to do so. __________________________________________________________________ A barium swallow is an examination that concentrates on views of the esophagus. This tends to be a double contrast exam (barium and two liquids which, when combined, create a gas to distend the wall of the oesophagus) or single contrast (non-ionic iodine based). The study is usually tailored to your symptoms so a good history is essential. Attention is paid during the study to the form, structure and configuration of the esophagus, looking for functional disorders (such as aspiration, dysphagia, achalasia, motility and reflux) EXAMINATION You may be asked to change into a gown, depending on the type of swallow being performed. A radiologist and radiographer will perform the procedure. The radiologist will advise you of the type of contrast selected for your procedure and  direct you during the exam. You will be asked to stand, sit or lie in several different positions and to hold a small amount of fluid in your mouth before being asked to swallow while the imaging is performed .In some instances you may be asked to swallow barium coated marshmallows to assess the motility of a solid food bolus. The exam can be recorded as a digital or video fluoroscopy procedure. POST PROCEDURE It will take 1-2 days for the barium to pass through your system. To facilitate this, it is important, unless otherwise directed, to increase your fluids for the next 24-48hrs and to resume your normal diet.  This test typically takes about 30 minutes to perform. ____________________________________________________________  START Pantoprazole 40 mg 1 tablet prior to breakfast Baclofen 5 mg 1 tablet three times daily as needed.  Follow up pending the results of your Barium Swallow.  Thank you for entrusting me with your care and choosing [facility].  Amy Esterwood, PA-C

## 2021-05-29 ENCOUNTER — Telehealth: Payer: Self-pay

## 2021-05-29 MED ORDER — METHOCARBAMOL 750 MG PO TABS: ORAL_TABLET | ORAL | 1 refills | Status: DC

## 2021-05-29 NOTE — Telephone Encounter (Signed)
Prior Authorization has been started 

## 2021-05-29 NOTE — Telephone Encounter (Addendum)
Patient called stating that he just got off the phone with insurance and they told him nothing is on file.     Also inquired about Baclofen and provided medications that they would cover Orphenadrane  Pholorzoxazone Methocarbol

## 2021-05-29 NOTE — Telephone Encounter (Signed)
Do you want to change the Baclofen to one of the medications listed below?

## 2021-05-29 NOTE — Telephone Encounter (Signed)
Methocarbamol has been sent to patient's pharmacy. I have called and spoke with the patient about the new medication being sent in to as a trial and hopefully this will not be a long term. I have advised him that the medication could be a little more sedative. I have also made him aware that I have submitted a prior authorization for his Pantoprazole. I let him know that it may be possible that it may not get covered since his insurance requires him to take 2 OTC PPIs, but I would let him know once I have received notification. He has expressed understanding, all questions have been answered.

## 2021-06-01 NOTE — Telephone Encounter (Signed)
Patient's Pantoprazole has been denied. His insurance wants him to try Nexium or Prevacid OTC first.

## 2021-06-01 NOTE — Telephone Encounter (Signed)
Sent patient a message from previous My Chart message.

## 2021-06-02 NOTE — Progress Notes (Signed)
____________________________________________________________  Attending physician addendum:  Thank you for sending this case to me. I have reviewed the entire note and agree with the plan.  Hiccups are uncommonly due to an upper digestive condition and I would not recommend an EGD , as it is expected to be low yield.  Amada Jupiter, MD  ____________________________________________________________

## 2021-06-05 ENCOUNTER — Ambulatory Visit (HOSPITAL_COMMUNITY)
Admission: RE | Admit: 2021-06-05 | Discharge: 2021-06-05 | Disposition: A | Discharge status: Home / Self Care | Payer: BC Managed Care – PPO | Source: Ambulatory Visit | Attending: Physician Assistant | Admitting: Physician Assistant

## 2021-06-05 ENCOUNTER — Other Ambulatory Visit: Payer: Self-pay

## 2021-06-05 DIAGNOSIS — R066 Hiccough: Secondary | ICD-10-CM | POA: Diagnosis not present

## 2021-06-05 DIAGNOSIS — K219 Gastro-esophageal reflux disease without esophagitis: Secondary | ICD-10-CM | POA: Diagnosis not present

## 2021-06-05 DIAGNOSIS — K2289 Other specified disease of esophagus: Secondary | ICD-10-CM | POA: Diagnosis not present

## 2021-06-10 ENCOUNTER — Other Ambulatory Visit: Payer: Self-pay | Admitting: Family Medicine

## 2021-06-10 DIAGNOSIS — M79605 Pain in left leg: Secondary | ICD-10-CM

## 2021-06-10 DIAGNOSIS — E119 Type 2 diabetes mellitus without complications: Secondary | ICD-10-CM

## 2021-06-10 DIAGNOSIS — Z794 Long term (current) use of insulin: Secondary | ICD-10-CM

## 2021-06-10 DIAGNOSIS — G8929 Other chronic pain: Secondary | ICD-10-CM

## 2021-06-11 ENCOUNTER — Encounter: Payer: Self-pay | Admitting: Family Medicine

## 2021-06-11 ENCOUNTER — Other Ambulatory Visit: Payer: Self-pay

## 2021-06-11 DIAGNOSIS — E1165 Type 2 diabetes mellitus with hyperglycemia: Secondary | ICD-10-CM

## 2021-06-11 MED ORDER — TECHLITE PEN NEEDLES 32G X 4 MM MISC: 3 refills | Status: DC

## 2021-06-15 ENCOUNTER — Telehealth: Payer: Self-pay

## 2021-06-15 DIAGNOSIS — E1165 Type 2 diabetes mellitus with hyperglycemia: Secondary | ICD-10-CM

## 2021-06-15 NOTE — Telephone Encounter (Signed)
Caller name:Laquinn Benton-Elliot ([facility])  On DPR? :Yes  Call back number:[phone]  Provider they see: Neva Seat  Reason for call:Pharmacy is wanting a call back out the [employer]

## 2021-06-15 NOTE — Telephone Encounter (Signed)
Called pharmacy and they stated they needed clarification on the quantity. I let them know I would have the provider confirm and send in correct quantity

## 2021-06-16 MED ORDER — TRULICITY 3 MG/0.5ML ~~LOC~~ SOAJ: 3.0000 mg | SUBCUTANEOUS | 1 refills | Status: DC

## 2021-06-16 NOTE — Telephone Encounter (Signed)
Called pharmacy. Quantity question - reordered as 72ml with 1 RF

## 2021-06-24 ENCOUNTER — Ambulatory Visit: Payer: BC Managed Care – PPO | Admitting: Family Medicine

## 2021-06-24 ENCOUNTER — Encounter: Payer: Self-pay | Admitting: Family Medicine

## 2021-06-24 VITALS — BP 122/80 | HR 73 | Temp 98.1°F | Resp 17 | Ht 74.0 in | Wt 348.0 lb

## 2021-06-24 DIAGNOSIS — Z794 Long term (current) use of insulin: Secondary | ICD-10-CM

## 2021-06-24 DIAGNOSIS — E785 Hyperlipidemia, unspecified: Secondary | ICD-10-CM | POA: Diagnosis not present

## 2021-06-24 DIAGNOSIS — E1165 Type 2 diabetes mellitus with hyperglycemia: Secondary | ICD-10-CM | POA: Diagnosis not present

## 2021-06-24 DIAGNOSIS — I10 Essential (primary) hypertension: Secondary | ICD-10-CM | POA: Diagnosis not present

## 2021-06-24 DIAGNOSIS — R066 Hiccough: Secondary | ICD-10-CM | POA: Diagnosis not present

## 2021-06-24 DIAGNOSIS — Z23 Encounter for immunization: Secondary | ICD-10-CM | POA: Diagnosis not present

## 2021-06-24 MED ORDER — LOSARTAN POTASSIUM-HCTZ 100-25 MG PO TABS
1.0000 | ORAL_TABLET | Freq: Every day | ORAL | 1 refills | Status: DC
Start: 2021-06-24 — End: 2022-01-13

## 2021-06-24 MED ORDER — AMLODIPINE BESYLATE 5 MG PO TABS: 5.0000 mg | ORAL_TABLET | Freq: Every day | ORAL | 1 refills | Status: DC

## 2021-06-24 MED ORDER — SIMVASTATIN 20 MG PO TABS: 20.0000 mg | ORAL_TABLET | Freq: Every day | ORAL | 0 refills | Status: DC

## 2021-06-24 NOTE — Progress Notes (Signed)
Subjective:  Patient ID: Matthew Hutchinson, male    DOB: [birth year]  Age: 60 y.o. MRN: [id number]  CC:  Chief Complaint  Patient presents with   Diabetes    Pt reports doing okay currently    Hypertension    Pt reports doing well, no concerns    Hyperlipidemia    Pt reports doing well needs recheck     HPI Matthew Hutchinson presents for  Diabetes: With hyperglycemia, treated with Trulicity, metformin, Lantus - changed to Semglee Trulicity 3 mg/week, improved A1c in September.  Metformin 500 mg twice daily and Semglee 62 units/day at that visit.  Continue same regimen. Referred to gastroenterology and visit November 17 for hiccups.  Ordered barium swallow, treated with Protonix 40 mg daily, baclofen 5 mg 3 times daily - not covered, but on methocarbamol - Minimal changes - plans to follow up with GI Dr. Benson Norway. Had some issues on barium swallow - not thought to be causing hiccups. Some relief with abdominal binder/wrap.  On gabapentin for chronic pain of left lower extremity. Home readings: Fasting: 100-140 Postprandial: up to 170. Some dietary indiscretion past few months with more driving - mother had leg fracture and in rehab in [place]. Having fast food at times.  Symptomatic lows: none.   No recent 200's.  Microalbumin: Normal ratio 04/02/2021 Optho, foot exam, pneumovax: Up-to-date Some home stress with neighbors currently.   Lab Results  Component Value Date   HGBA1C 6.9 (H) 03/25/2021   HGBA1C 7.3 (H) 12/10/2020   HGBA1C 7.7 (H) 08/28/2020   Lab Results  Component Value Date   MICROALBUR 1.7 04/02/2021   LDLCALC 76 03/25/2021   CREATININE 0.91 03/25/2021   Hypertension: Treated with Hyzaar 100/25 mg daily.  Amlodipine 5 mg daily. Home readings: 130/70 range.  BP Readings from Last 3 Encounters:  06/24/21 122/80  05/28/21 140/80  04/02/21 128/74   Lab Results  Component Value Date   CREATININE 0.91 03/25/2021   Hyperlipidemia: Simvastatin 20  mg daily. Lab Results  Component Value Date   CHOL 141 03/25/2021   HDL 41.10 03/25/2021   LDLCALC 76 03/25/2021   TRIG 119.0 03/25/2021   CHOLHDL 3 03/25/2021   Lab Results  Component Value Date   ALT 23 03/25/2021   AST 24 03/25/2021   ALKPHOS 91 03/25/2021   BILITOT 0.6 03/25/2021   Immunization History  Administered Date(s) Administered   Hepatitis B 10/02/2010, 11/02/2010, 03/08/2011   Influenza Split 04/11/2012   Influenza Whole 05/12/2010   Influenza,inj,Quad PF,6+ Mos 04/30/2013, 05/23/2014, 06/02/2015, 06/10/2016, 05/09/2018, 03/16/2019, 03/28/2020, 03/25/2021   PFIZER(Purple Top)SARS-COV-2 Vaccination 09/15/2019, 10/06/2019, 04/08/2020, 04/03/2021   Pneumococcal Conjugate-13 06/02/2015   Pneumococcal Polysaccharide-23 02/17/2009, 05/12/2010, 11/04/2017   Td 08/27/2004   Tdap 03/06/2015   Zoster Recombinat (Shingrix) 03/25/2021, 06/24/2021  2nd shingrix today.    History Patient Active Problem List   Diagnosis Date Noted   Newly diagnosed diabetes ([place]) 07/27/2018   Morbid obesity ([place]) 02/02/2017   Chronic pain due to trauma 06/25/2013   Depressive disorder, not elsewhere classified 11/09/2012   CAD (coronary artery disease) 02/21/2012   Lipid disorder 02/21/2012   Angina pectoris 09/27/2011   Obstructive sleep apnea 05/20/2010   Essential hypertension 04/17/2010   COPD mixed type ([place]) 04/17/2010   WEIGHT GAIN, ABNORMAL 04/17/2010   Past Medical History:  Diagnosis Date   Allergy    Anxiety    Arthritis    COPD (chronic obstructive pulmonary disease) ([place])    Depression  Emphysema of lung ([place])    Hyperlipidemia    Morbid obesity (H[facility])    OSA (obstructive sleep apnea)    uses CPAP nightly   PONV (postoperative nausea and vomiting)    Unspecified essential hypertension    Past Surgical History:  Procedure Laterality Date   INSERTION OF MESH N/A 09/18/2015   Procedure: INSERTION OF MESH;  Surgeon: Coralie Keens, MD;  Location: Home;  Service: General;  Laterality: N/A;   LEG SURGERY Left    SHOULDER ARTHROSCOPY Left    TONSILLECTOMY     UMBILICAL HERNIA REPAIR N/A 09/18/2015   Procedure: HERNIA REPAIR UMBILICAL ADULT WITH MESH ;  Surgeon: Coralie Keens, MD;  Location: [facility];  Service: General;  Laterality: N/A;   Allergies  Allergen Reactions   Penicillins Anaphylaxis    REACTION: anaphylaxis   Valsartan     REACTION: itch/swelling   Prior to Admission medications   Medication Sig Start Date End Date Taking? Authorizing Provider  amLODipine (NORVASC) 5 MG tablet Take 1 tablet (5 mg total) by mouth daily. 12/10/20  Yes Wendie Agreste, MD  aspirin 81 MG tablet Take 1 tablet (81 mg total) by mouth daily. 11/13/12  Yes Patrecia Pour, NP  b complex vitamins tablet Take 1 tablet by mouth daily.   Yes [provider]  blood glucose meter kit and supplies Dispense based on patient and insurance preference. Use 2-3 times per day. 05/09/18  Yes Wendie Agreste, MD  calcium gluconate 500 MG tablet Take 500 mg by mouth daily.   Yes [provider]  Carboxymethylcellulose Sodium (EYE DROPS OP) Apply to eye. Prednisone acetate USP 1% BID   Yes [provider]  Cholecalciferol (VITAMIN D) 2000 UNITS tablet Take 1 tablet (2,000 Units total) by mouth daily. 11/13/12  Yes Lord, Asa Saunas, NP  Dulaglutide (TRULICITY) 3 MW/1.0UV SOPN Inject 3 mg as directed once a week. 06/16/21  Yes Wendie Agreste, MD  gabapentin (NEURONTIN) 300 MG capsule TAKE 2 CAPSULES BY MOUTH 2 TIMES DAILY. 06/11/21  Yes Wendie Agreste, MD  hydrOXYzine (VISTARIL) 50 MG capsule TAKE 2 CAPSULES BY MOUTH AT BEDTIME 04/14/21  Yes Arfeen, Arlyce Harman, MD  insulin glargine-yfgn (SEMGLEE) 100 UNIT/ML Pen Inject 62 Units into the skin daily. 05/15/21  Yes Wendie Agreste, MD  Insulin Pen Needle (TECHLITE PEN NEEDLES) 32G X 4 MM MISC Use once daily with lantus. Dx E11.9, Z79.4 06/11/21  Yes Wendie Agreste, MD   lamoTRIgine (LAMICTAL) 150 MG tablet Take 1 tablet (150 mg total) by mouth daily. 04/14/21  Yes Arfeen, Arlyce Harman, MD  losartan-hydrochlorothiazide (HYZAAR) 100-25 MG tablet Take 1 tablet by mouth daily. 12/10/20  Yes Wendie Agreste, MD  Lutein 40 MG CAPS Take 1 capsule by mouth daily.   Yes [provider]  magnesium gluconate (MAGONATE) 500 MG tablet Take 500 mg by mouth daily.   Yes [provider]  metFORMIN (GLUCOPHAGE) 500 MG tablet TAKE 1 TABLET BY MOUTH 2 TIMES DAILY WITH A MEAL. 06/11/21  Yes Wendie Agreste, MD  methocarbamol (ROBAXIN) 750 MG tablet Take 1 tablet by mouth 2-3 times daily as needed for muscle spasms. 05/29/21  Yes Esterwood, Amy S, PA-C  Multiple Vitamin (MULTIVITAMIN) capsule Take 1 capsule by mouth daily. 11/13/12  Yes Patrecia Pour, NP  nitroGLYCERIN (NITROSTAT) 0.4 MG SL tablet Place 1 tablet (0.4 mg total) under the tongue every 5 (five) minutes as needed for chest  pain. If chest pain not resolved, after 3 doses 5 minutes apart--call 911 11/13/12  Yes Lord, Asa Saunas, NP  pantoprazole (PROTONIX) 40 MG tablet Take 1 tablet (40 mg total) by mouth daily before breakfast. 05/28/21  Yes Esterwood, Amy S, PA-C  Potassium 99 MG TABS Take 1 tablet by mouth daily.   Yes [provider]  sertraline (ZOLOFT) 100 MG tablet TAKE 1 TABLET BY MOUTH DAILY 04/14/21  Yes Arfeen, Arlyce Harman, MD  Shark Cartilage 740 MG CAPS Take 1 capsule by mouth 3 (three) times daily.   Yes [provider]  simvastatin (ZOCOR) 20 MG tablet Take 1 tablet (20 mg total) by mouth at bedtime. 12/10/20  Yes Wendie Agreste, MD  traMADol (ULTRAM) 50 MG tablet Take 50 mg by mouth every 6 (six) hours as needed for pain.   Yes [provider]  traZODone (DESYREL) 100 MG tablet Take 1 tablet (100 mg total) by mouth at bedtime as needed for sleep. 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 Education: college. Exercise: No.   Social Determinants of Health   Financial Resource Strain: Not on file  Food Insecurity: Not on file  Transportation Needs: Not on file  Physical Activity: Not on file  Stress: Not on file  Social Connections: Not on file  Intimate Partner Violence: Not on file    Review of Systems  Constitutional:  Negative for fatigue and unexpected weight change.  Eyes:  Negative for visual disturbance.  Respiratory:  Negative for cough, chest tightness and shortness of breath.   Cardiovascular:  Negative for chest pain, palpitations and leg swelling.  Gastrointestinal:  Negative for abdominal pain and blood in stool.  Neurological:  Negative for dizziness, light-headedness and headaches.    Objective:   Vitals:   06/24/21 1548  BP: 122/80  Pulse: 73  Resp: 17  Temp: 98.1 F (36.7 C)  TempSrc: Temporal  SpO2: 94%  Weight: (!) 348 lb (157.9 kg)  Height: $Remove'6\' 2"'iZkRmGh$  (1.88 m)     Physical Exam Vitals reviewed.  Constitutional:      Appearance: He is well-developed.  HENT:     Head: Normocephalic and atraumatic.  Neck:     Vascular: No carotid bruit or JVD.  Cardiovascular:     Rate and Rhythm: Normal rate and regular rhythm.     Heart sounds: Normal heart  sounds. No murmur heard. Pulmonary:     Effort: Pulmonary effort is normal.  Breath sounds: Normal breath sounds. No rales.  Musculoskeletal:     Right lower leg: No edema.     Left lower leg: No edema.  Skin:    General: Skin is warm and dry.  Neurological:     Mental Status: He is alert and oriented to person, place, and time.  Psychiatric:        Mood and Affect: Mood normal.        Behavior: Behavior normal.       Assessment & Plan:  Matthew Hutchinson is a 60 y.o. male . Hyperlipidemia, unspecified hyperlipidemia type - Plan: Lipid panel  -Tolerating current statin regimen, continue same, check labs.  Essential hypertension - Plan: amLODipine (NORVASC) 5 MG tablet, losartan-hydrochlorothiazide (HYZAAR) 100-25 MG tablet, Comprehensive metabolic panel  -Stable, continue same dose Hyzaar, Norvasc, check labs  Type 2 diabetes mellitus with hyperglycemia, with long-term current use of insulin (HCC) - Plan: simvastatin (ZOCOR) 20 MG tablet, Hemoglobin A1c  -Improved A1c last visit, home readings overall sound reasonable.  Denies hypoglycemia.  Continue same regimen for now, check A1c.  Does admit to some room for improvement with diet.  Hiccups  -Persistent, unfortunately minimal change with methocarbamol.  Recommend he discuss with his previous gastroenterologist to decide on further testing or treatment options, or referrals.  I am happy to place referral to different specialists if recommended by gastroenterology.  Compressive band with some improving, okay to continue same.  Need for shingles vaccine - Plan: Varicella-zoster vaccine IM given with potential side effects discussed.   Meds ordered this encounter  Medications   amLODipine (NORVASC) 5 MG tablet    Sig: Take 1 tablet (5 mg total) by mouth daily.    Dispense:  90 tablet    Refill:  1   losartan-hydrochlorothiazide (HYZAAR) 100-25 MG tablet    Sig: Take 1 tablet by mouth daily.    Dispense:  90 tablet     Refill:  1   simvastatin (ZOCOR) 20 MG tablet    Sig: Take 1 tablet (20 mg total) by mouth at bedtime.    Dispense:  30 tablet    Refill:  0   Patient Instructions  Follow up with Dr. Benson Norway and get his opinion on your hiccups. I am happy to refer you to other specialist if needed. Ok to continue abdominal binder/wrap for now.   No med changes at this time. Thanks for coming in today.      Signed,   Merri Ray, MD [facility] 06/24/21 6:38 PM

## 2021-06-24 NOTE — Patient Instructions (Addendum)
Follow up with Dr. Elnoria Howard and get his opinion on your hiccups. I am happy to refer you to other specialist if needed. Ok to continue abdominal binder/wrap for now.   No med changes at this time. Thanks for coming in today.

## 2021-06-25 LAB — COMPREHENSIVE METABOLIC PANEL
ALT: 23 U/L (ref 0–53)
AST: 22 U/L (ref 0–37)
Albumin: 4.5 g/dL (ref 3.5–5.2)
Alkaline Phosphatase: 85 U/L (ref 39–117)
BUN: 9 mg/dL (ref 6–23)
CO2: 25 mEq/L (ref 19–32)
Calcium: 9.5 mg/dL (ref 8.4–10.5)
Chloride: 104 mEq/L (ref 96–112)
Creatinine, Ser: 0.91 mg/dL (ref 0.40–1.50)
GFR: 91.7 mL/min (ref >60.00)
Glucose, Bld: 79 mg/dL (ref 70–99)
Potassium: 4 mEq/L (ref 3.5–5.1)
Sodium: 139 mEq/L (ref 135–145)
Total Bilirubin: 0.7 mg/dL (ref 0.2–1.2)
Total Protein: 7.1 g/dL (ref 6.0–8.3)

## 2021-06-25 LAB — LIPID PANEL
Cholesterol: 132 mg/dL (ref 0–200)
HDL: 43.8 mg/dL (ref >39.00)
LDL Cholesterol: 72 mg/dL (ref 0–99)
NonHDL: 88.43
Total CHOL/HDL Ratio: 3
Triglycerides: 82 mg/dL (ref 0.0–149.0)
VLDL: 16.4 mg/dL (ref 0.0–40.0)

## 2021-06-25 LAB — HEMOGLOBIN A1C: Hgb A1c MFr Bld: 6.5 % (ref 4.6–6.5)

## 2021-07-04 ENCOUNTER — Other Ambulatory Visit: Payer: Self-pay | Admitting: Physician Assistant

## 2021-07-10 ENCOUNTER — Other Ambulatory Visit (HOSPITAL_COMMUNITY): Payer: Self-pay | Admitting: Psychiatry

## 2021-07-10 DIAGNOSIS — F330 Major depressive disorder, recurrent, mild: Secondary | ICD-10-CM

## 2021-07-15 ENCOUNTER — Encounter (HOSPITAL_COMMUNITY): Payer: Self-pay | Admitting: Psychiatry

## 2021-07-15 ENCOUNTER — Other Ambulatory Visit: Payer: Self-pay

## 2021-07-15 ENCOUNTER — Telehealth (HOSPITAL_BASED_OUTPATIENT_CLINIC_OR_DEPARTMENT_OTHER): Payer: BC Managed Care – PPO | Admitting: Psychiatry

## 2021-07-15 DIAGNOSIS — F330 Major depressive disorder, recurrent, mild: Secondary | ICD-10-CM | POA: Diagnosis not present

## 2021-07-15 DIAGNOSIS — F411 Generalized anxiety disorder: Secondary | ICD-10-CM | POA: Diagnosis not present

## 2021-07-15 MED ORDER — TRAZODONE HCL 100 MG PO TABS: 100.0000 mg | ORAL_TABLET | Freq: Every evening | ORAL | 0 refills | Status: DC | PRN

## 2021-07-15 MED ORDER — SERTRALINE HCL 100 MG PO TABS: ORAL_TABLET | ORAL | 0 refills | Status: DC

## 2021-07-15 MED ORDER — LAMOTRIGINE 150 MG PO TABS: 150.0000 mg | ORAL_TABLET | Freq: Every day | ORAL | 0 refills | Status: DC

## 2021-07-15 MED ORDER — HYDROXYZINE PAMOATE 50 MG PO CAPS: ORAL_CAPSULE | ORAL | 0 refills | Status: DC

## 2021-07-15 NOTE — Progress Notes (Signed)
Virtual Visit via Telephone Note  I connected with Matthew Hutchinson on 07/15/21 at  2:20 PM EST by telephone and verified that I am speaking with the correct person using two identifiers.  Location: Patient: Home Provider: Home Office   I discussed the limitations, risks, security and privacy concerns of performing an evaluation and management service by telephone and the availability of in person appointments. I also discussed with the patient that there may be a patient responsible charge related to this service. The patient expressed understanding and agreed to proceed.   History of Present Illness: Patient is evaluated by phone session.  He is taking all his medication as prescribed.  Patient told her mother fell and broke her hip 1 week after her 90th birthday.  She is currently in rehab and he wanted to visit her but lately her business partner came out with a COVID and patient is in a quarantine because he had a meeting with him few days ago.  The patient denies any symptoms but is still wants to keep himself isolated and hoping after 10 days he will visit his mother.  He is also relieved that his family home situation issue will resolve.  They have to sell the portion of the property to stop the auction of the house.  However he is still upset on his brother and now all his siblings decided to talk to the brother who was responsible for this crisis.  Patient overall sleeping good.  He is using CPAP.  He denies any anger, mood swing, irritability, crying spells.  Recently had a visit with his PCP and his labs were drawn.  His hemoglobin A1c further reduced to 6.5.  He is happy about it.  He lost some weight.  He is active.  The relationship is going well.  Denies any panic attack.  He wants to keep the current medication.  He has no rash, itching tremors or shakes.  Past Psychiatric History:  [facility].  H/O overdose on Ambien 2 other times but did not  require inpatient treatment.  H/O of mood swing, impulsive behavior, speeding ticket, anger, road rage.  No history of psychosis or hallucination.  Recent Results (from the past 2160 hour(s))  HM DIABETES EYE EXAM     Status: None   Collection Time: 05/12/21 12:00 AM  Result Value Ref Range   HM Diabetic Eye Exam No Retinopathy No Retinopathy  Comprehensive metabolic panel     Status: None   Collection Time: 06/24/21  4:56 PM  Result Value Ref Range   Sodium 139 135 - 145 mEq/L   Potassium 4.0 3.5 - 5.1 mEq/L   Chloride 104 96 - 112 mEq/L   CO2 25 19 - 32 mEq/L   Glucose, Bld 79 70 - 99 mg/dL   BUN 9 6 - 23 mg/dL   Creatinine, Ser 1.610.91 0.40 - 1.50 mg/dL   Total Bilirubin 0.7 0.2 - 1.2 mg/dL   Alkaline Phosphatase 85 39 - 117 U/L   AST 22 0 - 37 U/L   ALT 23 0 - 53 U/L   Total Protein 7.1 6.0 - 8.3 g/dL   Albumin 4.5 3.5 - 5.2 g/dL   GFR 09.6091.70 >45.40>60.00 mL/min    Comment: Calculated using the CKD-EPI Creatinine Equation (2021)   Calcium 9.5 8.4 - 10.5 mg/dL  Lipid panel     Status: None   Collection Time: 06/24/21  4:56 PM  Result Value Ref Range  Cholesterol 132 0 - 200 mg/dL    Comment: ATP III Classification       Desirable:  < 200 mg/dL               Borderline High:  200 - 239 mg/dL          High:  > = 814 mg/dL   Triglycerides 48.1 0.0 - 149.0 mg/dL    Comment: Normal:  <856 mg/dLBorderline High:  150 - 199 mg/dL   HDL 31.49 >70.26 mg/dL   VLDL 37.8 0.0 - 58.8 mg/dL   LDL Cholesterol 72 0 - 99 mg/dL   Total CHOL/HDL Ratio 3     Comment:                Men          Women1/2 Average Risk     3.4          3.3Average Risk          5.0          4.42X Average Risk          9.6          7.13X Average Risk          15.0          11.0                       NonHDL 88.43     Comment: NOTE:  Non-HDL goal should be 30 mg/dL higher than patient's LDL goal (i.e. LDL goal of < 70 mg/dL, would have non-HDL goal of < 100 mg/dL)  Hemoglobin F0Y     Status: None   Collection Time: 06/24/21   4:56 PM  Result Value Ref Range   Hgb A1c MFr Bld 6.5 4.6 - 6.5 %    Comment: Glycemic Control Guidelines for People with Diabetes:Non Diabetic:  <6%Goal of Therapy: <7%Additional Action Suggested:  >8%     Psychiatric Specialty Exam: Physical Exam  Review of Systems  Weight (!) 348 lb (157.9 kg).There is no height or weight on file to calculate BMI.  General Appearance: NA  Eye Contact:  NA  Speech:  Normal Rate  Volume:  Normal  Mood:  Anxious and Dysphoric  Affect:  NA  Thought Process:  Goal Directed  Orientation:  Full (Time, Place, and Person)  Thought Content:  Logical  Suicidal Thoughts:  No  Homicidal Thoughts:  No  Memory:  Immediate;   Good Recent;   Good Remote;   Good  Judgement:  Intact  Insight:  Present  Psychomotor Activity:  NA  Concentration:  Concentration: Good and Attention Span: Good  Recall:  Good  Fund of Knowledge:  Good  Language:  Good  Akathisia:  No  Handed:  Right  AIMS (if indicated):     Assets:  Communication Skills Desire for Improvement Housing Transportation  ADL's:  Intact  Cognition:  WNL  Sleep:        Assessment and Plan: Major depressive disorder, recurrent.  Generalized anxiety disorder.    Patient is stable on his medication.  I reviewed blood work results.  His hemoglobin is improved from the past.  He lost some weight and is more active.  His family home situation getting better.  He is hoping to see his mother back in 10 days.  He like to keep the current medication.  Continue Zoloft 100 mg daily, trazodone 100 mg at bedtime, hydroxyzine 50 mg  two pills at bedtime and Lamictal 150 mg daily.  Recommended to call us back if is any question of any concern.  Follow-up in 3 months.  Follow Up Instructions:    I discussed the assessment and treatment plan with the patient. The patient was provided an opportunity to ask questions and all were answered. The patient agreed with the plan and demonstrated an understanding of the  instructions.   The patient was advised to call back or seek an in-person evaluation if the symptoms worsen or if the condition fails to improve as anticipated.  I provided 19 minutes of non-face-to-face time during this encounter.   Cleotis Nipper, MD

## 2021-07-22 DIAGNOSIS — R066 Hiccough: Secondary | ICD-10-CM | POA: Diagnosis not present

## 2021-07-22 DIAGNOSIS — R1013 Epigastric pain: Secondary | ICD-10-CM | POA: Diagnosis not present

## 2021-07-22 DIAGNOSIS — I10 Essential (primary) hypertension: Secondary | ICD-10-CM | POA: Diagnosis not present

## 2021-07-30 DIAGNOSIS — G4733 Obstructive sleep apnea (adult) (pediatric): Secondary | ICD-10-CM | POA: Diagnosis not present

## 2021-08-19 ENCOUNTER — Other Ambulatory Visit: Payer: Self-pay | Admitting: Gastroenterology

## 2021-08-19 DIAGNOSIS — R066 Hiccough: Secondary | ICD-10-CM | POA: Diagnosis not present

## 2021-09-02 DIAGNOSIS — G4733 Obstructive sleep apnea (adult) (pediatric): Secondary | ICD-10-CM | POA: Diagnosis not present

## 2021-09-11 ENCOUNTER — Other Ambulatory Visit: Payer: Self-pay | Admitting: Registered Nurse

## 2021-09-11 ENCOUNTER — Other Ambulatory Visit: Payer: Self-pay | Admitting: Family Medicine

## 2021-09-11 DIAGNOSIS — E1165 Type 2 diabetes mellitus with hyperglycemia: Secondary | ICD-10-CM

## 2021-09-11 DIAGNOSIS — G8929 Other chronic pain: Secondary | ICD-10-CM

## 2021-09-11 DIAGNOSIS — Z794 Long term (current) use of insulin: Secondary | ICD-10-CM

## 2021-09-22 ENCOUNTER — Other Ambulatory Visit: Payer: Self-pay | Admitting: Family Medicine

## 2021-09-22 DIAGNOSIS — E1165 Type 2 diabetes mellitus with hyperglycemia: Secondary | ICD-10-CM

## 2021-09-23 ENCOUNTER — Ambulatory Visit: Payer: BC Managed Care – PPO | Admitting: Family Medicine

## 2021-09-23 ENCOUNTER — Encounter: Payer: Self-pay | Admitting: Family Medicine

## 2021-09-23 VITALS — BP 146/78 | HR 95 | Temp 98.2°F | Resp 17 | Ht 74.0 in | Wt 354.2 lb

## 2021-09-23 DIAGNOSIS — R110 Nausea: Secondary | ICD-10-CM

## 2021-09-23 DIAGNOSIS — R066 Hiccough: Secondary | ICD-10-CM | POA: Diagnosis not present

## 2021-09-23 DIAGNOSIS — Z794 Long term (current) use of insulin: Secondary | ICD-10-CM | POA: Diagnosis not present

## 2021-09-23 DIAGNOSIS — E1165 Type 2 diabetes mellitus with hyperglycemia: Secondary | ICD-10-CM | POA: Diagnosis not present

## 2021-09-23 DIAGNOSIS — I10 Essential (primary) hypertension: Secondary | ICD-10-CM

## 2021-09-23 LAB — POCT GLYCOSYLATED HEMOGLOBIN (HGB A1C): Hemoglobin A1C: 6.2 % — AB (ref 4.0–5.6)

## 2021-09-23 NOTE — Patient Instructions (Addendum)
Diabetes is controlled with improved A1C today.  ?Decrease insulin to 60 units per day. Watch for low blood sugars and if those occur, let me know and we will adjust your meds further.  ?Carry [place] candy or other form of sugar with you in case you do drop low.  ?Some form of calories in the morning and midday as insulin is working all day.  ?Discuss nausea with gastroenterologist, follow up to discuss further if persistent (depending on instructions from gastroenterologist) but if any acute worsening or new symptoms be seen right away.  ?Blood pressure was borderline elevated today.  Check your readings at home and if you have readings above 140/90 follow-up to discuss medication changes.  ? ?Recheck in 3 months for diabetes, fasting labs.  ? ? ?

## 2021-09-23 NOTE — Progress Notes (Signed)
Subjective:  Patient ID: Matthew Hutchinson, male    DOB: [birth year]  Age: 61 y.o. MRN: [id number]  CC:  Chief Complaint  Patient presents with   Diabetes    Pt here for recheck no concerns    Immunizations    Pt would like to discuss some questions about COVID vaccine     HPI [name] presents for   Diabetes: With hyperglycemia, treated with Trulicity, metformin, Lantus then changed to [facility].  Metformin 500 mg twice daily, Semglee 62 units/day.  Trulicity 3 mg/week.  Improving control at his visit in December even with some dietary indiscretion. One meal per day at night,  then small snack midday.  Home readings fasting: 101-128 Home readings postprandial: 140-180  Nausea in the morning, some during day. Still with hiccups and planned EGD - 10/16/21 - some improvement with NTG prescribed by Dr. Elnoria Howard. Worse nausea with hiccups, no fever, no vomiting.  symptomatic lows: none.  Microalbumin: Normal ratio 04/02/2021 Increased stress with family issues, has psychiatrist. Attributes higher BP to stress. No missed doses of meds- has monitor at home.  Wt Readings from Last 3 Encounters:  09/23/21 (!) 354 lb 3.2 oz (160.7 kg)  06/24/21 (!) 348 lb (157.9 kg)  05/28/21 (!) 352 lb (159.7 kg)   Optho, foot exam, pneumovax:  Up-to-date Lab Results  Component Value Date   HGBA1C 6.2 (A) 09/23/2021   HGBA1C 6.5 06/24/2021   HGBA1C 6.9 (H) 03/25/2021   Lab Results  Component Value Date   MICROALBUR 1.7 04/02/2021   LDLCALC 72 06/24/2021   CREATININE 0.91 06/24/2021   Vaccine counseling: Questions regarding COVID-19 vaccination Last vaccine 04/03/2021.  Bivalent vaccine.  Mother with positive test 3 days ago, home tests still positive.     History Patient Active Problem List   Diagnosis Date Noted   Newly diagnosed diabetes (HCC) 07/27/2018   Morbid obesity (HCC) 02/02/2017   Chronic pain due to trauma 06/25/2013   Depressive disorder, not elsewhere classified  11/09/2012   CAD (coronary artery disease) 02/21/2012   Lipid disorder 02/21/2012   Angina pectoris 09/27/2011   Obstructive sleep apnea 05/20/2010   Essential hypertension 04/17/2010   COPD mixed type (HCC) 04/17/2010   WEIGHT GAIN, ABNORMAL 04/17/2010   Past Medical History:  Diagnosis Date   Allergy    Anxiety    Arthritis    COPD (chronic obstructive pulmonary disease) (HCC)    Depression    Emphysema of lung (HCC)    Hyperlipidemia    Morbid obesity (HCC)    OSA (obstructive sleep apnea)    uses CPAP nightly   PONV (postoperative nausea and vomiting)    Unspecified essential hypertension    Past Surgical History:  Procedure Laterality Date   INSERTION OF MESH N/A 09/18/2015   Procedure: INSERTION OF MESH;  Surgeon: Abigail Miyamoto, MD;  Location: [facility];  Service: General;  Laterality: N/A;   LEG SURGERY Left    SHOULDER ARTHROSCOPY Left    TONSILLECTOMY     UMBILICAL HERNIA REPAIR N/A 09/18/2015   Procedure: HERNIA REPAIR UMBILICAL ADULT WITH MESH ;  Surgeon: Abigail Miyamoto, MD;  Location: [facility];  Service: General;  Laterality: N/A;   Allergies  Allergen Reactions   Penicillins Anaphylaxis    REACTION: anaphylaxis   Valsartan     REACTION: itch/swelling   Prior to Admission medications   Medication Sig Start Date End Date Taking? Authorizing Provider  amLODipine (NORVASC) 5 MG tablet Take  1 tablet (5 mg total) by mouth daily. 06/24/21  Yes Shade Flood, MD  aspirin 81 MG tablet Take 1 tablet (81 mg total) by mouth daily. 11/13/12  Yes [name], NP  b complex vitamins tablet Take 1 tablet by mouth daily.   Yes [provider]  blood glucose meter kit and supplies Dispense based on patient and insurance preference. Use 2-3 times per day. 05/09/18  Yes Shade Flood, MD  calcium gluconate 500 MG tablet Take 500 mg by mouth daily.   Yes [provider]  Carboxymethylcellulose Sodium (EYE DROPS OP)  Apply to eye. Prednisone acetate USP 1% BID   Yes [provider]  Cholecalciferol (VITAMIN D) 2000 UNITS tablet Take 1 tablet (2,000 Units total) by mouth daily. 11/13/12  Yes [name], NP  gabapentin (NEURONTIN) 300 MG capsule TAKE 2 CAPSULES BY MOUTH TWICE A DAY 09/11/21  Yes Shade Flood, MD  hydrOXYzine (VISTARIL) 50 MG capsule TAKE 2 CAPSULES BY MOUTH AT BEDTIME 07/15/21  Yes Arfeen, Phillips Grout, MD  insulin glargine-yfgn (SEMGLEE) 100 UNIT/ML Pen Inject 62 Units into the skin daily. 05/15/21  Yes Shade Flood, MD  Insulin Pen Needle (TECHLITE PEN NEEDLES) 32G X 4 MM MISC Use once daily with lantus. Dx E11.9, Z79.4 06/11/21  Yes Shade Flood, MD  lamoTRIgine (LAMICTAL) 150 MG tablet Take 1 tablet (150 mg total) by mouth daily. 07/15/21  Yes Arfeen, Phillips Grout, MD  losartan-hydrochlorothiazide (HYZAAR) 100-25 MG tablet Take 1 tablet by mouth daily. 06/24/21  Yes Shade Flood, MD  Lutein 40 MG CAPS Take 1 capsule by mouth daily.   Yes [provider]  magnesium gluconate (MAGONATE) 500 MG tablet Take 500 mg by mouth daily.   Yes [provider]  metFORMIN (GLUCOPHAGE) 500 MG tablet TAKE 1 TABLET BY MOUTH 2 TIMES DAILY WITH A MEAL. 06/11/21  Yes Shade Flood, MD  methocarbamol (ROBAXIN) 750 MG tablet TAKE 1 TABLET BY MOUTH 2-3 TIMES DAILY AS NEEDED FOR MUSCLE SPASMS. 07/07/21  Yes Esterwood, Amy S, PA-C  Multiple Vitamin (MULTIVITAMIN) capsule Take 1 capsule by mouth daily. 11/13/12  Yes Lord, Herminio Heads, NP  nitroGLYCERIN (NITROSTAT) 0.4 MG SL tablet Place 1 tablet (0.4 mg total) under the tongue every 5 (five) minutes as needed for chest pain. If chest pain not resolved, after 3 doses 5 minutes apart--call 911 11/13/12  Yes Lord, Herminio Heads, NP  pantoprazole (PROTONIX) 40 MG tablet Take 1 tablet (40 mg total) by mouth daily before breakfast. 05/28/21  Yes Esterwood, Amy S, PA-C  Potassium 99 MG TABS Take 1 tablet by mouth daily.   Yes [provider]   sertraline (ZOLOFT) 100 MG tablet TAKE 1 TABLET BY MOUTH DAILY 07/15/21  Yes Arfeen, Phillips Grout, MD  Shark Cartilage 740 MG CAPS Take 1 capsule by mouth 3 (three) times daily.   Yes [provider]  simvastatin (ZOCOR) 20 MG tablet TAKE 1 TABLET BY MOUTH AT BEDTIME 09/11/21  Yes Janeece Agee, NP  traMADol (ULTRAM) 50 MG tablet Take 50 mg by mouth every 6 (six) hours as needed for pain.   Yes [provider]  traZODone (DESYREL) 100 MG tablet Take 1 tablet (100 mg total) by mouth at bedtime as needed for sleep. 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 Education: college. Exercise: No.   Social Determinants of Health   Financial Resource Strain: Not on file  Food Insecurity: Not on file  Transportation Needs: Not on file  Physical Activity: Not on file  Stress: Not on file  Social Connections: Not on file  Intimate Partner Violence: Not on file    Review of Systems Per HPI.   Objective:   Vitals:   09/23/21 1544 09/23/21 1637  BP: (!) 154/78 (!) 146/78  Pulse: 95   Resp:  17   Temp: 98.2 F (36.8 C)   TempSrc: Temporal   SpO2: 93%   Weight: (!) 354 lb 3.2 oz (160.7 kg)   Height: 6\' 2"  (1.88 m)      Physical Exam Vitals reviewed.  Constitutional:      Appearance: He is well-developed.  HENT:     Head: Normocephalic and atraumatic.  Neck:     Vascular: No carotid bruit or JVD.  Cardiovascular:     Rate and Rhythm: Normal rate and regular rhythm.     Heart sounds: Normal heart sounds. No murmur heard. Pulmonary:     Effort: Pulmonary effort is normal.     Breath sounds: Normal breath sounds. No rales.  Musculoskeletal:     Right lower leg: No edema.     Left lower leg: No edema.  Skin:    General: Skin is warm and dry.  Neurological:     Mental Status: He is alert and oriented to person, place, and time.  Psychiatric:        Mood and Affect: Mood normal.       Assessment & Plan:  ANTRELL ABRAHA is a 61 y.o. male . Type 2 diabetes mellitus with hyperglycemia, with long-term current use of insulin (HCC) - Plan: POCT glycosylated hemoglobin (Hb A1C)  -Commended on improved diabetes control.  We will continue same dose of Trulicity, metformin, decrease insulin by 2 units.  Stressed importance of regular meals and risk of hypoglycemia with skipping meals.  RTC precautions if hypoglycemia occurs, especially with improved diabetic control.  Advised to carry some form of sugar with him at all times.  45-month follow-up  Nausea Hiccups  -History of nausea, could be related in part to his eating patterns above.  Does notice worse symptoms with hiccups.  With some worsening of hiccups recently, has noted more nausea symptoms but no vomiting or fever.  Under care of gastroenterology, recommend he discuss the symptoms with GI, or follow-up if worsening nausea/new symptoms.  Essential hypertension  -Slight elevation in office, attributes to current stressors.  Home monitoring discussed with RTC precautions.  Continue same regimen for now.  No  orders of the defined types were placed in this encounter.  Patient Instructions  Diabetes is controlled with improved A1C today.  Decrease insulin to 60 units per day. Watch for low blood sugars and if those occur, let me know and we will adjust your meds further.  Carry Airhead candy or other form of sugar with you in case you do drop low.  Some form of calories in the morning and midday as insulin is working all day.  Discuss nausea with gastroenterologist, follow up to discuss further if persistent (depending on instructions from gastroenterologist) but if any acute worsening or new symptoms be seen right away.  Blood pressure was borderline elevated today.  Check your readings at home and if you have readings above 140/90 follow-up to discuss medication changes.   Recheck in 3 months for diabetes, fasting labs.       Signed,   [name], MD [facility] 09/23/21 7:36 PM

## 2021-10-02 DIAGNOSIS — G4733 Obstructive sleep apnea (adult) (pediatric): Secondary | ICD-10-CM | POA: Diagnosis not present

## 2021-10-08 ENCOUNTER — Encounter (HOSPITAL_COMMUNITY): Payer: Self-pay | Admitting: Gastroenterology

## 2021-10-12 ENCOUNTER — Other Ambulatory Visit (HOSPITAL_COMMUNITY): Payer: Self-pay | Admitting: Psychiatry

## 2021-10-12 DIAGNOSIS — F330 Major depressive disorder, recurrent, mild: Secondary | ICD-10-CM

## 2021-10-14 ENCOUNTER — Encounter (HOSPITAL_COMMUNITY): Payer: Self-pay | Admitting: Psychiatry

## 2021-10-14 ENCOUNTER — Telehealth (HOSPITAL_BASED_OUTPATIENT_CLINIC_OR_DEPARTMENT_OTHER): Payer: BC Managed Care – PPO | Admitting: Psychiatry

## 2021-10-14 DIAGNOSIS — F330 Major depressive disorder, recurrent, mild: Secondary | ICD-10-CM

## 2021-10-14 DIAGNOSIS — F411 Generalized anxiety disorder: Secondary | ICD-10-CM

## 2021-10-14 MED ORDER — LAMOTRIGINE 150 MG PO TABS: 150.0000 mg | ORAL_TABLET | Freq: Every day | ORAL | 0 refills | Status: DC

## 2021-10-14 MED ORDER — HYDROXYZINE PAMOATE 50 MG PO CAPS: ORAL_CAPSULE | ORAL | 0 refills | Status: DC

## 2021-10-14 MED ORDER — SERTRALINE HCL 100 MG PO TABS: ORAL_TABLET | ORAL | 0 refills | Status: DC

## 2021-10-14 MED ORDER — TRAZODONE HCL 100 MG PO TABS: 100.0000 mg | ORAL_TABLET | Freq: Every evening | ORAL | 0 refills | Status: DC | PRN

## 2021-10-14 NOTE — Progress Notes (Signed)
Virtual Visit via Telephone Note ? ?I connected with Matthew Hutchinson on 10/14/21 at  2:20 PM EDT by telephone and verified that I am speaking with the correct person using two identifiers. ? ?Location: ?Patient: Home ?Provider: Home Office ?  ?I discussed the limitations, risks, security and privacy concerns of performing an evaluation and management service by telephone and the availability of in person appointments. I also discussed with the patient that there may be a patient responsible charge related to this service. The patient expressed understanding and agreed to proceed. ? ? ?History of Present Illness: ?Patient is evaluated by phone session.  He is somewhat sad because mother passed away 2 weeks ago and now family is trying to arrange  funeral at [place].  Patient reported she was in and out from rehab and due to COVID runs in the family he was not able to see her lately.  But he understand that she is in a better place.  He admitted sometime frustrated dealing with the family members during the funeral.  However overall he feels medicines are working and symptoms are manageable.  He is using CPAP that is helping his sleep.  Recently had a visit to his PCP and he is pleased that his hemoglobin A1c is dropped to 6.2.  He also taking nitroglycerin for hiccups.  Denies any crying spells or any feeling of hopelessness or worthlessness.  Denies any anger or any agitation.  His business going well and his relationship with the partner is also going well.  He has no tremors, shakes or any EPS.  Like to keep his current medication.  He has no rash, itching tremors or shakes. ? ? ?Past Psychiatric History:  ?H/O inpatient at [facility] for overdose on Ambien.  H/O overdose on Ambien 2 other times but did not require inpatient treatment.  H/O of mood swing, impulsive behavior, speeding ticket, anger, road rage.  No history of psychosis or hallucination. ? ?Recent Results (from the past 2160 hour(s))  ?POCT  glycosylated hemoglobin (Hb A1C)     Status: Abnormal  ? Collection Time: 09/23/21  4:12 PM  ?Result Value Ref Range  ? Hemoglobin A1C 6.2 (A) 4.0 - 5.6 %  ? HbA1c POC (<> result, manual entry)    ? HbA1c, POC (prediabetic range)    ? HbA1c, POC (controlled diabetic range)    ?  ?  ?Psychiatric Specialty Exam: ?Physical Exam  ?Review of Systems  ?Weight (!) 354 lb (160.6 kg).There is no height or weight on file to calculate BMI.  ?General Appearance: NA  ?Eye Contact:  NA  ?Speech:  Normal Rate  ?Volume:  Normal  ?Mood:  Dysphoric  ?Affect:  NA  ?Thought Process:  Goal Directed  ?Orientation:  Full (Time, Place, and Person)  ?Thought Content:  Logical  ?Suicidal Thoughts:  No  ?Homicidal Thoughts:  No  ?Memory:  Immediate;   Good ?Recent;   Good ?Remote;   Good  ?Judgement:  Good  ?Insight:  Present  ?Psychomotor Activity:  NA  ?Concentration:  Concentration: Good and Attention Span: Good  ?Recall:  Good  ?Fund of Knowledge:  Good  ?Language:  Good  ?Akathisia:  No  ?Handed:  Right  ?AIMS (if indicated):     ?Assets:  Communication Skills ?Desire for Improvement ?Housing ?Transportation  ?ADL's:  Intact  ?Cognition:  WNL  ?Sleep:   ok with cpap  ? ? ? ? ?Assessment and Plan: ?Major depressive disorder, recurrent.  Anxiety. ? ?I reviewed blood  work results.  His hemoglobin A1c is improved.  He does not want to change the medication.  Condolences given about losing his mother.  I offer grief therapy but patient does not feel he needed at this time.  I will continue Zoloft 100 mg daily, trazodone 100 mg at bedtime, hydroxyzine 50 mg 2 pills at bedtime and Lamictal 150 mg daily.  Recommended to call us back if is any question or any concern.  Follow-up in 3 months. ? ?Follow Up Instructions: ? ?  ?I discussed the assessment and treatment plan with the patient. The patient was provided an opportunity to ask questions and all were answered. The patient agreed with the plan and demonstrated an understanding of the  instructions. ?  ?The patient was advised to call back or seek an in-person evaluation if the symptoms worsen or if the condition fails to improve as anticipated. ? ?Collaboration of Care: Primary Care Provider AEB notes are in Epic to review.  ? ?Patient/Guardian was advised Release of Information must be obtained prior to any record release in order to collaborate their care with an outside provider. Patient/Guardian was advised if they have not already done so to contact the registration department to sign all necessary forms in order for [place] to release information regarding their care.  ? ?Consent: Patient/Guardian gives verbal consent for treatment and assignment of benefits for services provided during this visit. Patient/Guardian expressed understanding and agreed to proceed.   ? ?I provided 25 minutes of non-face-to-face time during this encounter. ? ? ?Cleotis Nipper, MD  ?

## 2021-10-16 ENCOUNTER — Other Ambulatory Visit: Payer: Self-pay

## 2021-10-16 ENCOUNTER — Encounter (HOSPITAL_COMMUNITY)
Admission: RE | Disposition: A | Discharge status: Home / Self Care | Payer: Self-pay | Source: Home / Self Care | Attending: Gastroenterology

## 2021-10-16 ENCOUNTER — Ambulatory Visit (HOSPITAL_COMMUNITY): Payer: BC Managed Care – PPO | Admitting: Anesthesiology

## 2021-10-16 ENCOUNTER — Ambulatory Visit (HOSPITAL_COMMUNITY)
Admission: RE | Admit: 2021-10-16 | Discharge: 2021-10-16 | Disposition: A | Discharge status: Home / Self Care | Payer: BC Managed Care – PPO | Attending: Gastroenterology | Admitting: Gastroenterology

## 2021-10-16 ENCOUNTER — Encounter (HOSPITAL_COMMUNITY): Payer: Self-pay | Admitting: Gastroenterology

## 2021-10-16 DIAGNOSIS — R1310 Dysphagia, unspecified: Secondary | ICD-10-CM | POA: Diagnosis not present

## 2021-10-16 DIAGNOSIS — R066 Hiccough: Secondary | ICD-10-CM | POA: Diagnosis not present

## 2021-10-16 DIAGNOSIS — Z87891 Personal history of nicotine dependence: Secondary | ICD-10-CM | POA: Diagnosis not present

## 2021-10-16 DIAGNOSIS — Z941 Heart transplant status: Secondary | ICD-10-CM | POA: Insufficient documentation

## 2021-10-16 DIAGNOSIS — Z6841 Body Mass Index (BMI) 40.0 and over, adult: Secondary | ICD-10-CM | POA: Diagnosis not present

## 2021-10-16 DIAGNOSIS — Z9989 Dependence on other enabling machines and devices: Secondary | ICD-10-CM | POA: Diagnosis not present

## 2021-10-16 DIAGNOSIS — E119 Type 2 diabetes mellitus without complications: Secondary | ICD-10-CM | POA: Insufficient documentation

## 2021-10-16 DIAGNOSIS — G4733 Obstructive sleep apnea (adult) (pediatric): Secondary | ICD-10-CM | POA: Diagnosis not present

## 2021-10-16 DIAGNOSIS — Z79899 Other long term (current) drug therapy: Secondary | ICD-10-CM | POA: Insufficient documentation

## 2021-10-16 DIAGNOSIS — F32A Depression, unspecified: Secondary | ICD-10-CM | POA: Insufficient documentation

## 2021-10-16 DIAGNOSIS — F419 Anxiety disorder, unspecified: Secondary | ICD-10-CM | POA: Diagnosis not present

## 2021-10-16 DIAGNOSIS — J449 Chronic obstructive pulmonary disease, unspecified: Secondary | ICD-10-CM | POA: Diagnosis not present

## 2021-10-16 DIAGNOSIS — Z7984 Long term (current) use of oral hypoglycemic drugs: Secondary | ICD-10-CM | POA: Insufficient documentation

## 2021-10-16 DIAGNOSIS — I10 Essential (primary) hypertension: Secondary | ICD-10-CM | POA: Diagnosis not present

## 2021-10-16 HISTORY — PX: ESOPHAGOGASTRODUODENOSCOPY (EGD) WITH PROPOFOL: SHX5813

## 2021-10-16 HISTORY — PX: SAVORY DILATION: SHX5439

## 2021-10-16 LAB — GLUCOSE, CAPILLARY: Glucose-Capillary: 151 mg/dL — ABNORMAL HIGH (ref 70–99)

## 2021-10-16 SURGERY — ESOPHAGOGASTRODUODENOSCOPY (EGD) WITH PROPOFOL
Anesthesia: Monitor Anesthesia Care

## 2021-10-16 MED ORDER — LACTATED RINGERS IV SOLN
INTRAVENOUS | Status: DC
  Administered 2021-10-16: 1000 mL via INTRAVENOUS

## 2021-10-16 MED ORDER — PROPOFOL 500 MG/50ML IV EMUL
INTRAVENOUS | Status: DC | PRN
  Administered 2021-10-16: 150 ug/kg/min via INTRAVENOUS

## 2021-10-16 MED ORDER — PROPOFOL 1000 MG/100ML IV EMUL
INTRAVENOUS | Status: AC
  Filled 2021-10-16: qty 100

## 2021-10-16 SURGICAL SUPPLY — 15 items

## 2021-10-16 NOTE — H&P (Signed)
Matthew Hutchinson ?HPI: The patient complains about dysphagia and hiccups. ? ?Past Medical History:  ?Diagnosis Date  ? Allergy   ? Anxiety   ? Arthritis   ? COPD (chronic obstructive pulmonary disease) (HCC)   ? Depression   ? Emphysema of lung (HCC)   ? Hyperlipidemia   ? Morbid obesity (HCC)   ? OSA (obstructive sleep apnea)   ? uses CPAP nightly  ? PONV (postoperative nausea and vomiting)   ? Unspecified essential hypertension   ? ? ?Past Surgical History:  ?Procedure Laterality Date  ? INSERTION OF MESH N/A 09/18/2015  ? Procedure: INSERTION OF MESH;  Surgeon: Abigail Miyamoto, MD;  Location: [facility];  Service: General;  Laterality: N/A;  ? LEG SURGERY Left   ? SHOULDER ARTHROSCOPY Left   ? TONSILLECTOMY    ? UMBILICAL HERNIA REPAIR N/A 09/18/2015  ? Procedure: HERNIA REPAIR UMBILICAL ADULT WITH MESH ;  Surgeon: Abigail Miyamoto, MD;  Location: [facility];  Service: General;  Laterality: N/A;  ? ? ?Family History  ?Problem Relation Age of Onset  ? Ovarian cancer Mother   ? Breast cancer Mother   ? Macular degeneration Mother   ? Alcohol abuse Mother   ? Arthritis Mother   ? Emphysema Paternal Grandfather   ? Cancer - Other Father   ?     gallbladder cancer  ? Alcohol abuse Father   ? Cancer Maternal Grandmother   ? Heart disease Maternal Grandfather   ? Emphysema Paternal Grandmother   ? Hyperlipidemia Brother   ? Hypertension Brother   ? Lung cancer Paternal Uncle   ? ? ?Social History:  reports that he quit smoking about 18 years ago. His smoking use included cigarettes. He has a 52.00 pack-year smoking history. He has never used smokeless tobacco. He reports that he does not drink alcohol and does not use drugs. ? ?Allergies:  ?Allergies  ?Allergen Reactions  ? Penicillins Anaphylaxis  ? Valsartan Itching and Swelling  ? ? ?Medications: Scheduled: ?Continuous: ? lactated ringers Stopped (10/16/21 1221)  ? ? ?Results for orders placed or performed during the hospital  encounter of 10/16/21 (from the past 24 hour(s))  ?Glucose, capillary     Status: Abnormal  ? Collection Time: 10/16/21 11:01 AM  ?Result Value Ref Range  ? Glucose-Capillary 151 (H) 70 - 99 mg/dL  ?  ? ?No results found. ? ?ROS:  As stated above in the HPI otherwise negative. ? ?Blood pressure (!) 150/71, pulse 86, temperature (!) 97.4 ?F (36.3 ?C), temperature source Temporal, resp. rate (!) 23, height 6' 2.5" (1.892 m), weight (!) 157.9 kg, SpO2 92 %.   ? ?PE: ?Gen: NAD, Alert and Oriented ?HEENT:  [place]/AT, EOMI ?Neck: Supple, no LAD ?Lungs: CTA Bilaterally ?CV: RRR without M/G/R ?ABD: Soft, NTND, +BS ?Ext: No C/C/E ? ?Assessment/Plan: ?1) Dysphagia. ?2) Hiccups. ? ?Plan: ?1) EGD with dilation. ? ?Kerington Hildebrant D ?10/16/2021, 2:00 PM  ? ? ?  ?

## 2021-10-16 NOTE — Discharge Instructions (Signed)

## 2021-10-16 NOTE — Anesthesia Preprocedure Evaluation (Signed)
Anesthesia Evaluation  ?Patient identified by MRN, date of birth, ID band ?Patient awake ? ? ? ?Reviewed: ?Allergy & Precautions, NPO status , Patient's Chart, lab work & pertinent test results ? ?History of Anesthesia Complications ?(+) PONV and history of anesthetic complications ? ?Airway ?Mallampati: III ? ?TM Distance: >3 FB ?Neck ROM: Full ? ? ? Dental ? ?(+) Partial Upper, Partial Lower ?  ?Pulmonary ?sleep apnea and Continuous Positive Airway Pressure Ventilation , COPD, former smoker,  ?  ?Pulmonary exam normal ?breath sounds clear to auscultation ? ? ? ? ? ? Cardiovascular ?hypertension, Pt. on medications ?Normal cardiovascular exam ?Rhythm:Regular Rate:Normal ? ? ?  ?Neuro/Psych ?PSYCHIATRIC DISORDERS Anxiety Depression negative neurological ROS ?   ? GI/Hepatic ?negative GI ROS, Neg liver ROS,   ?Endo/Other  ?diabetes, Type 2, Oral Hypoglycemic Agents, Insulin DependentMorbid obesity ? Renal/GU ?negative Renal ROS  ?negative genitourinary ?  ?Musculoskeletal ?negative musculoskeletal ROS ?(+)  ? Abdominal ?(+) + obese,   ?Peds ?negative pediatric ROS ?(+)  Hematology ?negative hematology ROS ?(+)   ?Anesthesia Other Findings ? ? Reproductive/Obstetrics ?negative OB ROS ? ?  ? ? ? ? ? ? ? ? ? ? ? ? ? ?  ?  ? ? ? ? ? ? ? ? ?Anesthesia Physical ? ?Anesthesia Plan ? ?ASA: III ? ?Anesthesia Plan: MAC  ? ?Post-op Pain Management:   ? ?Induction: Intravenous ? ?PONV Risk Score and Plan: Propofol infusion and TIVA ? ?Airway Management Planned: Natural Airway and Mask ? ?Additional Equipment: None ? ?Intra-op Plan:  ? ?Post-operative Plan:  ? ?Informed Consent: I have reviewed the patients History and Physical, chart, labs and discussed the procedure including the risks, benefits and alternatives for the proposed anesthesia with the patient or authorized representative who has indicated his/her understanding and acceptance.  ? ? ? ?Dental advisory given ? ?Plan Discussed with:  CRNA ? ?Anesthesia Plan Comments:   ? ? ? ? ? ? ?Anesthesia Quick Evaluation ? ?

## 2021-10-16 NOTE — Transfer of Care (Signed)
Immediate Anesthesia Transfer of Care Note ? ?Patient: Matthew Hutchinson ? ?Procedure(s) Performed: ESOPHAGOGASTRODUODENOSCOPY (EGD) WITH PROPOFOL ?SAVORY DILATION ? ?Patient Location: PACU ? ?Anesthesia Type:MAC ? ?Level of Consciousness: awake, alert  and oriented ? ?Airway & Oxygen Therapy: Patient Spontanous Breathing and Patient connected to face mask oxygen ? ?Post-op Assessment: Report given to RN and Post -op Vital signs reviewed and stable ? ?Post vital signs: Reviewed and stable ? ?Last Vitals:  ?Vitals Value Taken Time  ?BP    ?Temp    ?Pulse 74 10/16/21 1152  ?Resp 15 10/16/21 1152  ?SpO2 95 % 10/16/21 1152  ?Vitals shown include unvalidated device data. ? ?Last Pain:  ?Vitals:  ? 10/16/21 1046  ?TempSrc: Temporal  ?PainSc: 0-No pain  ?   ? ?  ? ?Complications: No notable events documented. ?

## 2021-10-16 NOTE — Anesthesia Postprocedure Evaluation (Signed)
Anesthesia Post Note ? ?Patient: Matthew Hutchinson ? ?Procedure(s) Performed: ESOPHAGOGASTRODUODENOSCOPY (EGD) WITH PROPOFOL ?SAVORY DILATION ? ?  ? ?Patient location during evaluation: Phase II ?Anesthesia Type: MAC ?Level of consciousness: awake ?Pain management: pain level controlled ?Vital Signs Assessment: post-procedure vital signs reviewed and stable ?Respiratory status: spontaneous breathing ?Cardiovascular status: stable ?Postop Assessment: no apparent nausea or vomiting ?Anesthetic complications: no ? ? ?No notable events documented. ? ?Last Vitals:  ?Vitals:  ? 10/16/21 1210 10/16/21 1220  ?BP: 135/86   ?Pulse: 76 86  ?Resp: 19 (!) 23  ?Temp:    ?SpO2: 92% 92%  ?  ?Last Pain:  ?Vitals:  ? 10/16/21 1220  ?TempSrc:   ?PainSc: 0-No pain  ? ? ?  ?  ?  ?  ?  ?  ? ?Huston Foley ? ? ? ? ?

## 2021-10-16 NOTE — Op Note (Signed)
[facility] ?Patient Name: Matthew Hutchinson ?Procedure Date: 10/16/2021 ?MRN: [id number] ?Attending MD: Carol Ada , MD ?Date of Birth: 09/22/1960 ?CSN: [id number] ?Age: 61 ?Admit Type: Outpatient ?Procedure:                Upper GI endoscopy ?Indications:              Dysphagia, Hiccups ?Providers:                Carol Ada, MD, Grace Isaac, RN, Julious Oka  ?                          [place], RN, [employer], Merchant navy officer ?Referring MD:              ?Medicines:                Propofol per Anesthesia ?Complications:            No immediate complications. ?Estimated Blood Loss:     Estimated blood loss: none. ?Procedure:                Pre-Anesthesia Assessment: ?                          - Prior to the procedure, a History and Physical  ?                          was performed, and patient medications and  ?                          allergies were reviewed. The patient's tolerance of  ?                          previous anesthesia was also reviewed. The risks  ?                          and benefits of the procedure and the sedation  ?                          options and risks were discussed with the patient.  ?                          All questions were answered, and informed consent  ?                          was obtained. Prior Anticoagulants: The patient has  ?                          taken no previous anticoagulant or antiplatelet  ?                          agents. ASA Grade Assessment: III - A patient with  ?                          severe systemic disease. After reviewing the risks  ?  and benefits, the patient was deemed in  ?                          satisfactory condition to undergo the procedure. ?                          - Sedation was administered by an anesthesia  ?                          professional. Deep sedation was attained. ?                          After obtaining informed consent, the endoscope was  ?                          passed  under direct vision. Throughout the  ?                          procedure, the patient's blood pressure, pulse, and  ?                          oxygen saturations were monitored continuously. The  ?                          [id number]) Olympus endoscope was introduced  ?                          through the mouth, and advanced to the second part  ?                          of duodenum. The upper GI endoscopy was  ?                          accomplished without difficulty. The patient  ?                          tolerated the procedure well. ?Scope In: ?Scope Out: ?Findings: ?     No endoscopic abnormality was evident in the esophagus to explain the  ?     patient's complaint of dysphagia. It was decided, however, to proceed  ?     with dilation of the entire esophagus. A guidewire was placed and the  ?     scope was withdrawn. Dilation was performed with a Savary dilator with  ?     no resistance at 18 mm. The dilation site was examined following  ?     endoscope reinsertion and showed no change. ?     The stomach was normal. ?     The examined duodenum was normal. ?     In the distal esophagus it was torturous and it felt spasmed. Tip  ?     deflection did allow for passage of the endoscope. There was little  ?     receptive relaxation in this portion of the esophagus. A mechanical  ?     stricture was not identified. In an attempt to possibly alleviate the  ?     spasm the  esophagus was dilated with an 18 mm Savary dilator. A relook  ?     EGD was performed and it was subjectively mildly easier to pass the  ?     endoscope. ?Impression:               - No endoscopic esophageal abnormality to explain  ?                          patient's dysphagia. Esophagus dilated. Dilated. ?                          - Normal stomach. ?                          - Normal examined duodenum. ?                          - No specimens collected. ?Moderate Sedation: ?     Not Applicable - Patient had care per  Anesthesia. ?Recommendation:           - Patient has a contact number available for  ?                          emergencies. The signs and symptoms of potential  ?                          delayed complications were discussed with the  ?                          patient. Return to normal activities tomorrow.  ?                          Written discharge instructions were provided to the  ?                          patient. ?                          - Resume previous diet. ?                          - Continue present medications. ?Procedure Code(s):        --- Professional --- ?                          [phone], Esophagogastroduodenoscopy, flexible,  ?                          transoral; with insertion of guide wire followed by  ?                          passage of dilator(s) through esophagus over guide  ?                          wire ?Diagnosis Code(s):        --- Professional --- ?  R13.10, Dysphagia, unspecified ?CPT copyright 2019 American Medical Association. All rights reserved. ?The codes documented in this report are preliminary and upon coder review may  ?be revised to meet current compliance requirements. ?Carol Ada, MD ?Carol Ada, MD ?10/16/2021 11:55:04 AM ?This report has been signed electronically. ?Number of Addenda: 0 ?

## 2021-11-06 DIAGNOSIS — G4733 Obstructive sleep apnea (adult) (pediatric): Secondary | ICD-10-CM | POA: Diagnosis not present

## 2021-11-18 IMAGING — DX DG CHEST 2V
2 series · 2 of 2 positions shown · non-contrast
Comparison: 03/20/2008

CLINICAL DATA: Cough, hiccups for 4 months

EXAM:
CHEST - 2 VIEW

[chest pa]
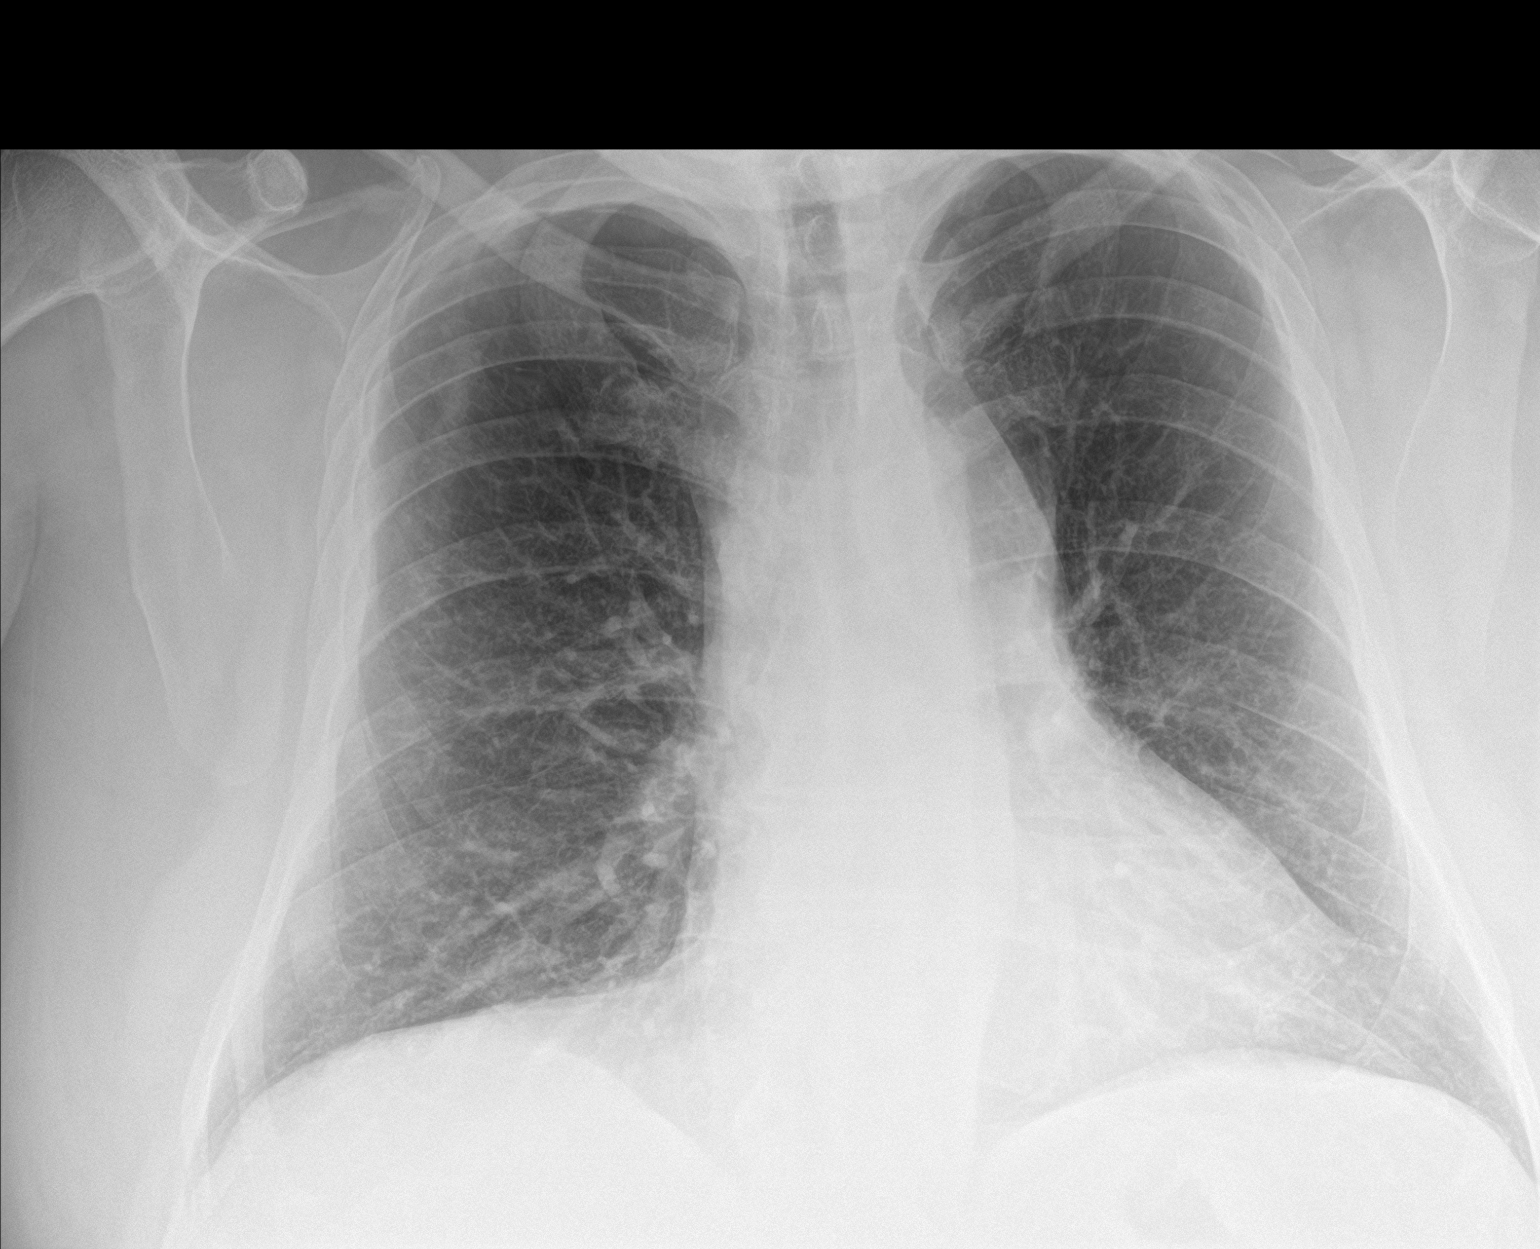

[chest lat]
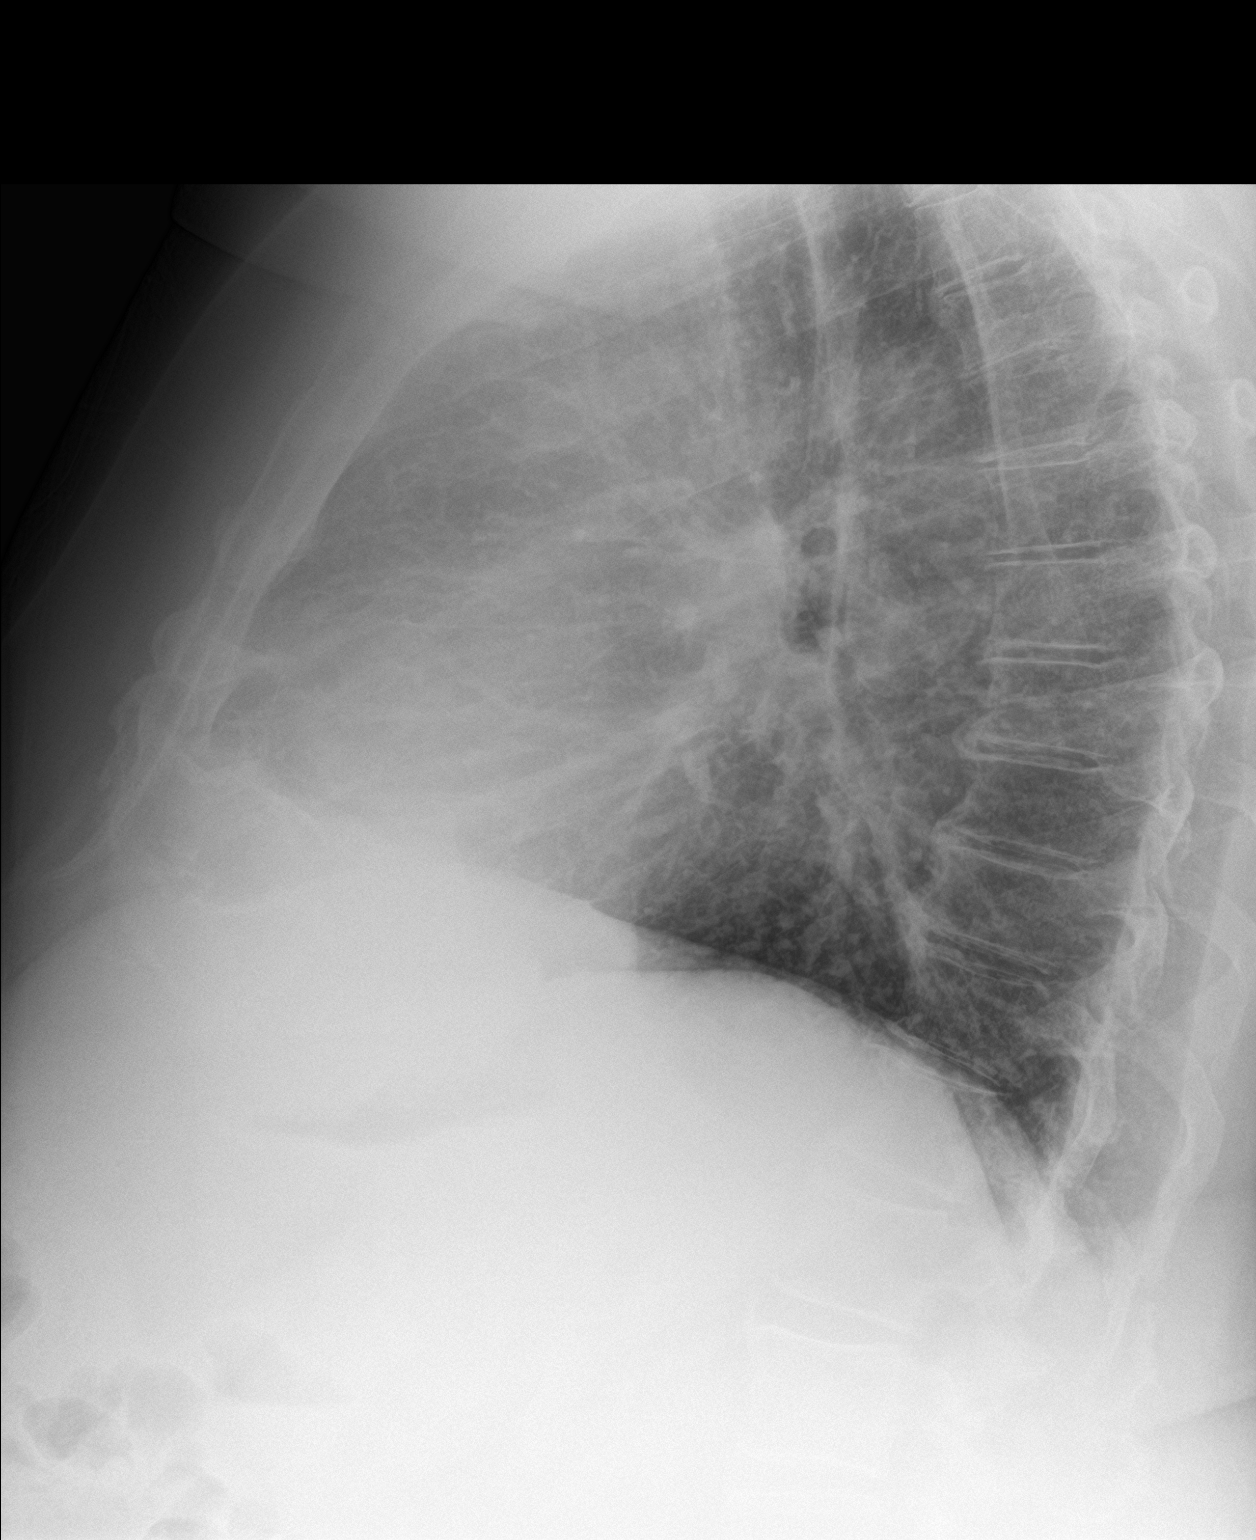

[2 of 2 positions shown; findings below may reference images not displayed]

FINDINGS: The heart size and mediastinal contours are within normal limits.
Both lungs are clear. The visualized skeletal structures are
unremarkable.
IMPRESSION: No active cardiopulmonary disease.

## 2021-11-22 ENCOUNTER — Other Ambulatory Visit: Payer: Self-pay | Admitting: Family Medicine

## 2021-12-02 ENCOUNTER — Telehealth (HOSPITAL_COMMUNITY): Payer: Self-pay | Admitting: *Deleted

## 2021-12-02 NOTE — Telephone Encounter (Signed)
thanks

## 2021-12-02 NOTE — Telephone Encounter (Signed)
This sounds too serious to handle on the phone. He needs to go to the [facility]

## 2021-12-02 NOTE — Telephone Encounter (Signed)
Yes ok. And I am trying to get a visit with Arfeen for nextweek if at all possible. Trying to find therapist also. Thank you.

## 2021-12-02 NOTE — Telephone Encounter (Signed)
Writer called Dr. Blair Heys pt who left generic VM. I was able to connect with pt today. Pt says he is reaching out because his anger actually described as "enraged" is becoming more frequent. He also says that he has not been going out of the house as he feels threatened due to the fact that he's gay in today's world climate. Pt consistently denies thoughts of self harm but states that he feels he could hurt someone else; badly. If someone were threatening to him, real or imagined as he has become hypersensitive. So he's requesting possibly an increase in medication or additional med. Pt also says he's in the bed sleeping or laying in bed, up to 18 hours per day. He doesn't see Arfeen until 7//6/23. He Is familiar with [facility], or ED. Has been inpt in the past r/t anger issues and depression. Please review and advise. Thanks so much.

## 2021-12-10 ENCOUNTER — Other Ambulatory Visit: Payer: Self-pay | Admitting: Family Medicine

## 2021-12-10 DIAGNOSIS — E119 Type 2 diabetes mellitus without complications: Secondary | ICD-10-CM

## 2021-12-14 ENCOUNTER — Other Ambulatory Visit: Payer: Self-pay | Admitting: Family Medicine

## 2021-12-14 DIAGNOSIS — G8929 Other chronic pain: Secondary | ICD-10-CM

## 2021-12-24 ENCOUNTER — Ambulatory Visit (INDEPENDENT_AMBULATORY_CARE_PROVIDER_SITE_OTHER): Payer: BC Managed Care – PPO | Admitting: Family Medicine

## 2021-12-24 ENCOUNTER — Encounter: Payer: Self-pay | Admitting: Family Medicine

## 2021-12-24 VITALS — BP 134/76 | HR 89 | Temp 98.2°F | Resp 16 | Ht 74.5 in | Wt 354.3 lb

## 2021-12-24 DIAGNOSIS — F339 Major depressive disorder, recurrent, unspecified: Secondary | ICD-10-CM

## 2021-12-24 DIAGNOSIS — I10 Essential (primary) hypertension: Secondary | ICD-10-CM | POA: Diagnosis not present

## 2021-12-24 DIAGNOSIS — E785 Hyperlipidemia, unspecified: Secondary | ICD-10-CM

## 2021-12-24 DIAGNOSIS — E1165 Type 2 diabetes mellitus with hyperglycemia: Secondary | ICD-10-CM | POA: Diagnosis not present

## 2021-12-24 DIAGNOSIS — Z794 Long term (current) use of insulin: Secondary | ICD-10-CM

## 2021-12-24 NOTE — Patient Instructions (Addendum)
It is important that you call your therapist and mental health providers and discuss your symptoms.  If any acute worsening symptoms call 988 hotline as we discussed.  Please follow up to discuss hiccups and plan if no relief with current treatment.   If any concerns on labs or med changes needed I will let you know.   Hang in there!

## 2021-12-24 NOTE — Progress Notes (Signed)
Subjective:  Patient ID: Matthew Hutchinson, male    DOB: [birth year]  Age: 61 y.o. MRN: [id number]  CC:  Chief Complaint  Patient presents with   Hyperlipidemia   Diabetes    Concerned A1c may be up due to rough few months    Depression    PHQ-9 =20 pt reports did call Dr Benancio Deeds but he was out of office and no one called him back to assist given increased sxs and has not been called back since Dr A has returned from vacation     HPI Matthew Hutchinson presents for   Diabetes: Insulin-dependent with previous hyperglycemia Improved control last visit.  Metformin 500 mg twice daily, Semglee decreased to 60 units/day, Trulicity 3 mg/week Readings fasting 130-160,  127 this am.  Postprandial: 220-240.  Symptomatic lows - none.  Microalbumin: Normal ratio 04/02/2021 Optho, foot exam, pneumovax: Up-to-date  Some increased snacking, dietary indiscretion at times. Stressful 3 months with some stress eating. Followed by psychiatry - plans to follow up to adjust meds. No SI, but has had homicidal ideation - same for awhile - past few months,  concerned about state of the world. Fantasies,no intent, no plan. Mother passed 3 days after out last visit. Has therapist, last talked a month ago and they are aware of his thoughts.  Considering moving.  Has network of friends that he has support system in place.  Psychiatry appt 01/14/22.  Carving out 2 hours per night for hobbies.   Lab Results  Component Value Date   HGBA1C 6.2 (A) 09/23/2021   HGBA1C 6.5 06/24/2021   HGBA1C 6.9 (H) 03/25/2021   Lab Results  Component Value Date   MICROALBUR 1.7 04/02/2021   LDLCALC 72 06/24/2021   CREATININE 0.91 06/24/2021      12/24/2021    3:50 PM 09/23/2021    3:46 PM 06/24/2021    3:50 PM 04/02/2021    1:48 PM 08/28/2020    3:18 PM  Depression screen PHQ 2/9  Decreased Interest _0 0 0  Down, Depressed, Hopeless _1 0 1  PHQ - 2 Score _2 0 1  Altered sleeping _3 0   Tired,  decreased energy _4 Change in appetite _5 Feeling bad or failure about yourself  0 0 0 1   Trouble concentrating _6 0   Moving slowly or fidgety/restless 3 2 0 0   Suicidal thoughts 0 0 0 0   PHQ-9 Score _7 Hyperlipidemia: Zocor 42m QD.  No new side effects.  Lab Results  Component Value Date   CHOL 132 06/24/2021   HDL 43.80 06/24/2021   LDLCALC 72 06/24/2021   TRIG 82.0 06/24/2021   CHOLHDL 3 06/24/2021   Lab Results  Component Value Date   ALT 23 06/24/2021   AST 22 06/24/2021   ALKPHOS 85 06/24/2021   BILITOT 0.7 06/24/2021    Hypertension: Norvasc 546mqd, hyzaar 100/254md.  No new side effects. No CP/dyspnea. Leg swelling better currently.  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   WEIGHT GAIN, ABNORMAL 04/17/2010   Past Medical History:  Diagnosis Date   Allergy    Anxiety    Arthritis    COPD (chronic obstructive pulmonary disease) (HCC)    Depression    Emphysema of lung (HCC)    Hyperlipidemia    Morbid obesity (HCC)    OSA (obstructive sleep apnea)    uses CPAP nightly   PONV (postoperative nausea and vomiting)    Unspecified essential hypertension    Past Surgical History:  Procedure Laterality Date   ESOPHAGOGASTRODUODENOSCOPY (EGD) WITH PROPOFOL N/A 10/16/2021   Procedure: ESOPHAGOGASTRODUODENOSCOPY (EGD) WITH PROPOFOL;  Surgeon: Carol Ada, MD;   Location: [facility];  Service: Endoscopy;  Laterality: N/A;   INSERTION OF MESH N/A 09/18/2015   Procedure: INSERTION OF MESH;  Surgeon: Coralie Keens, MD;  Location: [place];  Service: General;  Laterality: N/A;   LEG SURGERY Left    SAVORY DILATION N/A 10/16/2021   Procedure: SAVORY DILATION;  Surgeon: Carol Ada, MD;  Location: [facility];  Service: Endoscopy;  Laterality: N/A;   SHOULDER ARTHROSCOPY Left    TONSILLECTOMY     UMBILICAL HERNIA REPAIR N/A 09/18/2015   Procedure: HERNIA REPAIR UMBILICAL ADULT WITH MESH ;  Surgeon: Coralie Keens, MD;  Location: [place];  Service: General;  Laterality: N/A;   Allergies  Allergen Reactions   Penicillins Anaphylaxis   Valsartan Itching and Swelling   Prior to Admission medications   Medication Sig Start Date End Date Taking? Authorizing Provider  amLODipine (NORVASC) 5 MG tablet Take 1 tablet (5 mg total) by mouth daily. 06/24/21  Yes Wendie Agreste, MD  aspirin 81 MG tablet Take 1 tablet (81 mg total) by mouth daily. 11/13/12  Yes Patrecia Pour, NP  b complex vitamins tablet Take 1 tablet by mouth daily.   Yes [provider]  blood glucose meter kit and supplies Dispense based on patient and insurance preference. Use 2-3 times per day. 05/09/18  Yes Wendie Agreste, MD  calcium gluconate 500 MG tablet Take 500 mg by mouth daily.   Yes [provider]  Carboxymethylcellul-Glycerin (LUBRICATING EYE DROPS OP) Place 1 drop into both eyes daily as needed (dry eyes).   Yes [provider]  Cholecalciferol (VITAMIN D) 2000 UNITS tablet Take 1 tablet (2,000 Units total) by mouth daily. 11/13/12  Yes Patrecia Pour, NP  gabapentin (NEURONTIN) 300 MG capsule TAKE 2 CAPSULES BY MOUTH TWICE A DAY 12/15/21  Yes Wendie Agreste, MD  GARLIC PO Take 219 mg by mouth daily.   Yes [provider]  hydrOXYzine (VISTARIL) 50 MG capsule TAKE 2 CAPSULES BY MOUTH AT BEDTIME 10/14/21   Yes Arfeen, Arlyce Harman, MD  Insulin Pen Needle (TECHLITE PEN NEEDLES) 32G X 4 MM MISC Use once daily with lantus. Dx E11.9, Z79.4 06/11/21  Yes Wendie Agreste, MD  lamoTRIgine (LAMICTAL) 150 MG tablet Take 1 tablet (150 mg total) by mouth daily. 10/14/21  Yes Arfeen, Arlyce Harman, MD  losartan-hydrochlorothiazide (HYZAAR) 100-25 MG tablet Take 1 tablet by mouth daily. 06/24/21  Yes Wendie Agreste, MD  Lutein 40 MG CAPS Take 40 mg by mouth at bedtime.   Yes [provider]  magnesium gluconate (MAGONATE) 500 MG tablet Take 500 mg by mouth daily.   Yes [provider]  metFORMIN (GLUCOPHAGE) 500 MG tablet TAKE 1 TABLET BY MOUTH 2 TIMES DAILY WITH A MEAL. 12/10/21  Yes Greene, Jeffrey R, MD  Multiple Vitamin (MULTIVITAMIN) capsule Take 1 capsule by mouth daily. 11/13/12  Yes Lord, Jamison Y, NP  nitroGLYCERIN (NITROSTAT) 0.4 MG SL tablet Place 1 tablet (0.4 mg total) under the tongue every 5 (five) minutes as needed for chest pain. If chest pain not resolved, after 3 doses 5 minutes apart--call 911 Patient taking differently: Place 0.4 mg under the tongue See admin instructions. Take 0.4 mg as needed for hiccups 11/13/12  Yes Lord, Jamison Y, NP  Potassium 99 MG TABS Take 99 mg by mouth at bedtime.   Yes [provider]  SEMGLEE, YFGN, 100 UNIT/ML Pen INJECT 62 UNITS INTO THE SKIN DAILY. 11/23/21  Yes Greene, Jeffrey R, MD  sertraline (ZOLOFT) 100 MG tablet TAKE 1 TABLET BY MOUTH DAILY 10/14/21  Yes Arfeen, Syed T, MD  Shark Cartilage 740 MG CAPS Take 1 capsule by mouth 3 (three) times daily.   Yes [provider]  simvastatin (ZOCOR) 20 MG tablet TAKE 1 TABLET BY MOUTH AT BEDTIME 09/11/21  Yes Morrow, Richard, NP  traMADol (ULTRAM) 50 MG tablet Take 50 mg by mouth daily as needed for severe pain.   Yes [provider]  traZODone (DESYREL) 100 MG tablet Take 1 tablet (100 mg total) by mouth at bedtime as needed for sleep. 10/14/21  Yes Arfeen, Syed T, MD  TRULICITY 3 MG/0.5ML  SOPN INJECT 3 MG SUBCUTANEOUSLY ONCE A WEEK AS DIRECTED 09/22/21  Yes Greene, Jeffrey R, MD  methocarbamol (ROBAXIN) 750 MG tablet TAKE 1 TABLET BY MOUTH 2-3 TIMES DAILY AS NEEDED FOR MUSCLE SPASMS. 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 Education: college. Exercise: No.   Social Determinants of Health   Financial Resource Strain: Not on file  Food Insecurity: Not on file  Transportation Needs: Not on file  Physical Activity: Not on file  Stress: Not on file  Social Connections: Not on file  Intimate Partner Violence: Not on file    Review of Systems Per HPI.   Objective:   Vitals:   12/24/21 1552  BP: 134/76  Pulse: 89  Resp: 16  Temp: 98.2 F (36.8 C)  TempSrc: Temporal  SpO2: 92%  Weight: (!)   354 lb 4.8 oz (160.7 kg)  Height: 6' 2.5" (1.892 m)     Physical Exam Vitals reviewed.  Constitutional:      Appearance: He is well-developed.  HENT:     Head: Normocephalic and atraumatic.  Neck:     Vascular: No carotid bruit or JVD.  Cardiovascular:     Rate and Rhythm: Normal rate and regular rhythm.     Heart sounds:  Normal heart sounds. No murmur heard. Pulmonary:     Effort: Pulmonary effort is normal.     Breath sounds: Normal breath sounds. No rales.  Musculoskeletal:     Right lower leg: No edema.     Left lower leg: No edema.  Skin:    General: Skin is warm and dry.  Neurological:     Mental Status: He is alert and oriented to person, place, and time.  Psychiatric:        Attention and Perception: Attention normal.        Mood and Affect: Mood is depressed.        Speech: Speech normal.        Behavior: Behavior normal. Behavior is cooperative.        Thought Content: Thought content includes homicidal ideation. Thought content does not include suicidal ideation. Thought content does not include homicidal or suicidal plan.      Assessment & Plan:  Matthew Hutchinson is a 61 y.o. male . Type 2 diabetes mellitus with hyperglycemia, with long-term current use of insulin (HCC) - Plan: Hemoglobin A1c  -Some elevated postprandials, likely in response to diet, stress eating.  Check A1c, and then adjust insulin dosing likely initially.  Stress management, mental health follow-up with his therapist, psychiatrist planned as below.  Essential hypertension - Plan: Comprehensive metabolic panel  -Stable on current regimen, continue same, 11-monthrecheck.  Hyperlipidemia, unspecified hyperlipidemia type - Plan: Comprehensive metabolic panel, Lipid panel  -Tolerating statin, continue same  Depression, recurrent ([place]  -Worsening control as above.  Denies suicidal ideation but does admit to homicidal ideation without formal intent or plan.  He has reached out to his psychiatrist to discuss medication change options, but I recommended he contact his therapist tomorrow to discuss these thoughts and other plans if needed. 988 mental health hotline recommended if any worsening symptoms.  Has been evaluated by ENT for recurrent hiccups, plans to follow-up separately to discuss other options as persistent  symptoms.   No orders of the defined types were placed in this encounter.  Patient Instructions  It is important that you call your therapist and mental health providers and discuss your symptoms.  If any acute worsening symptoms call 988 hotline as we discussed.  Please follow up to discuss hiccups and plan if no relief with current treatment.   If any concerns on labs or med changes needed I will let you know.   Hang in there!      Signed,   JMerri Ray MD [facility] 12/24/21 5:03 PM

## 2021-12-25 LAB — COMPREHENSIVE METABOLIC PANEL
ALT: 24 U/L (ref 0–53)
AST: 25 U/L (ref 0–37)
Albumin: 4.5 g/dL (ref 3.5–5.2)
Alkaline Phosphatase: 77 U/L (ref 39–117)
BUN: 11 mg/dL (ref 6–23)
CO2: 28 mEq/L (ref 19–32)
Calcium: 9.6 mg/dL (ref 8.4–10.5)
Chloride: 98 mEq/L (ref 96–112)
Creatinine, Ser: 0.98 mg/dL (ref 0.40–1.50)
GFR: 83.6 mL/min (ref >60.00)
Glucose, Bld: 114 mg/dL — ABNORMAL HIGH (ref 70–99)
Potassium: 4.3 mEq/L (ref 3.5–5.1)
Sodium: 137 mEq/L (ref 135–145)
Total Bilirubin: 1 mg/dL (ref 0.2–1.2)
Total Protein: 7.2 g/dL (ref 6.0–8.3)

## 2021-12-25 LAB — HEMOGLOBIN A1C: Hgb A1c MFr Bld: 7.1 % — ABNORMAL HIGH (ref 4.6–6.5)

## 2021-12-25 LAB — LIPID PANEL
Cholesterol: 140 mg/dL (ref 0–200)
HDL: 41.3 mg/dL (ref >39.00)
LDL Cholesterol: 67 mg/dL (ref 0–99)
NonHDL: 98.85
Total CHOL/HDL Ratio: 3
Triglycerides: 157 mg/dL — ABNORMAL HIGH (ref 0.0–149.0)
VLDL: 31.4 mg/dL (ref 0.0–40.0)

## 2022-01-13 ENCOUNTER — Other Ambulatory Visit: Payer: Self-pay | Admitting: Family Medicine

## 2022-01-13 ENCOUNTER — Other Ambulatory Visit (HOSPITAL_COMMUNITY): Payer: Self-pay | Admitting: Psychiatry

## 2022-01-13 DIAGNOSIS — I10 Essential (primary) hypertension: Secondary | ICD-10-CM

## 2022-01-13 DIAGNOSIS — F330 Major depressive disorder, recurrent, mild: Secondary | ICD-10-CM

## 2022-01-14 ENCOUNTER — Encounter (HOSPITAL_COMMUNITY): Payer: Self-pay | Admitting: Psychiatry

## 2022-01-14 ENCOUNTER — Telehealth (HOSPITAL_COMMUNITY): Payer: BC Managed Care – PPO | Admitting: Psychiatry

## 2022-01-14 VITALS — Wt 354.0 lb

## 2022-01-14 DIAGNOSIS — F330 Major depressive disorder, recurrent, mild: Secondary | ICD-10-CM

## 2022-01-14 DIAGNOSIS — F411 Generalized anxiety disorder: Secondary | ICD-10-CM

## 2022-01-14 MED ORDER — TRAZODONE HCL 100 MG PO TABS: 100.0000 mg | ORAL_TABLET | Freq: Every evening | ORAL | 0 refills | Status: DC | PRN

## 2022-01-14 MED ORDER — HYDROXYZINE PAMOATE 50 MG PO CAPS: ORAL_CAPSULE | ORAL | 0 refills | Status: DC

## 2022-01-14 MED ORDER — SERTRALINE HCL 100 MG PO TABS: ORAL_TABLET | ORAL | 0 refills | Status: DC

## 2022-01-14 MED ORDER — LAMOTRIGINE 200 MG PO TABS: 200.0000 mg | ORAL_TABLET | Freq: Every day | ORAL | 0 refills | Status: DC

## 2022-01-14 NOTE — Progress Notes (Signed)
Virtual Visit via Telephone Note  I connected with Matthew Hutchinson on 01/14/22 at  2:20 PM EDT by telephone and verified that I am speaking with the correct person using two identifiers.  Location: Patient: Home Provider: Home Office   I discussed the limitations, risks, security and privacy concerns of performing an evaluation and management service by telephone and the availability of in person appointments. I also discussed with the patient that there may be a patient responsible charge related to this service. The patient expressed understanding and agreed to proceed.   History of Present Illness: Patient is evaluated by phone session.  He reported things has been very stressful lately and the last month was very difficult.  His 2 friends were murdered in [place] and is very upset.  He is also not happy with the climate outside.  He is getting easily upset, agitated and irritable.  He admitted having some rage but denies any violence or aggression.  Sometimes he does not want to do anything and sleeps up to 14 to 15 hours.  He gets frustrated with people around him and does not want to bother to talk to them.  He also reported it has been a difficult time since he lost his mother in March.  He had called our office few weeks ago and he was recommended to go to emergency room but patient did not go.  He admitted that he wanted to give time to himself and it got better with the time.  But is still have ups and downs in his mood.  Though he consistently denies any suicidal or homicidal thoughts but admitted that he is getting very sensitive and emotional when people talk about being lesbians.  Recently he was invited in any event but he did not go because he was not sure how people will handle about his talk.  He has decreased energy, sometimes hopelessness and worthlessness.  He denies any hallucination or any paranoia.  Recently had a blood work and his hemoglobin A1c is slightly increased from  the past.  His weight remains unchanged.  He is taking all his medication as prescribed.  His relationship with his partner/has been going okay.  He has no tremors, shakes or any EPS.  He does use his CPAP.   Past Psychiatric History:  H/O inpatient at [facility] for overdose on Ambien.  H/O overdose on Ambien 2 other times but did not require inpatient treatment.  H/O of mood swing, impulsive behavior, speeding ticket, anger, road rage.  No history of psychosis or hallucination.    Recent Results (from the past 2160 hour(s))  Comprehensive metabolic panel     Status: Abnormal   Collection Time: 12/24/21  4:11 PM  Result Value Ref Range   Sodium 137 135 - 145 mEq/L   Potassium 4.3 3.5 - 5.1 mEq/L   Chloride 98 96 - 112 mEq/L   CO2 28 19 - 32 mEq/L   Glucose, Bld 114 (H) 70 - 99 mg/dL   BUN 11 6 - 23 mg/dL   Creatinine, Ser 1.94 0.40 - 1.50 mg/dL   Total Bilirubin 1.0 0.2 - 1.2 mg/dL   Alkaline Phosphatase 77 39 - 117 U/L   AST 25 0 - 37 U/L   ALT 24 0 - 53 U/L   Total Protein 7.2 6.0 - 8.3 g/dL   Albumin 4.5 3.5 - 5.2 g/dL   GFR 17.40 >81.44 mL/min    Comment: Calculated using the CKD-EPI Creatinine Equation (2021)  Calcium 9.6 8.4 - 10.5 mg/dL  Lipid panel     Status: Abnormal   Collection Time: 12/24/21  4:11 PM  Result Value Ref Range   Cholesterol 140 0 - 200 mg/dL    Comment: ATP III Classification       Desirable:  < 200 mg/dL               Borderline High:  200 - 239 mg/dL          High:  > = 914240 mg/dL   Triglycerides 782.9157.0 (H) 0.0 - 149.0 mg/dL    Comment: Normal:  <562<150 mg/dLBorderline High:  150 - 199 mg/dL   HDL 13.0841.30 >65.78>39.00 mg/dL   VLDL 46.931.4 0.0 - 62.940.0 mg/dL   LDL Cholesterol 67 0 - 99 mg/dL   Total CHOL/HDL Ratio 3     Comment:                Men          Women1/2 Average Risk     3.4          3.3Average Risk          5.0          4.42X Average Risk          9.6          7.13X Average Risk          15.0          11.0                       NonHDL 98.85     Comment: NOTE:   Non-HDL goal should be 30 mg/dL higher than patient's LDL goal (i.e. LDL goal of < 70 mg/dL, would have non-HDL goal of < 100 mg/dL)  Hemoglobin B2WA1c     Status: Abnormal   Collection Time: 12/24/21  4:11 PM  Result Value Ref Range   Hgb A1c MFr Bld 7.1 (H) 4.6 - 6.5 %    Comment: Glycemic Control Guidelines for People with Diabetes:Non Diabetic:  <6%Goal of Therapy: <7%Additional Action Suggested:  >8%       Psychiatric Specialty Exam: Physical Exam  Review of Systems  Weight (!) 354 lb (160.6 kg).Body mass index is 44.84 kg/m.  General Appearance: NA  Eye Contact:  NA  Speech:  Pressured  Volume:  Increased  Mood:  Anxious, Depressed, Dysphoric, and Irritable  Affect:  NA  Thought Process:  Descriptions of Associations: Intact  Orientation:  Full (Time, Place, and Person)  Thought Content:  Rumination  Suicidal Thoughts:  No  Homicidal Thoughts:  No  Memory:  Immediate;   Fair Recent;   Good Remote;   Good  Judgement:  Fair  Insight:  Shallow  Psychomotor Activity:  Increased  Concentration:  Concentration: Good and Attention Span: Good  Recall:  Good  Fund of Knowledge:  Good  Language:  Good  Akathisia:  No  Handed:  Right  AIMS (if indicated):     Assets:  Communication Skills Desire for Improvement Housing Social Support Talents/Skills Transportation  ADL's:  Intact  Cognition:  WNL  Sleep:   12-15 hrs      Assessment and Plan: Major depressive disorder, recurrent.  Anxiety.  We discussed psychosocial stressors, current medication.  Patient is open to try adjusting the dose of medication and also open to see a therapist to help his coping skills.  We will refer him to see a  therapist in our office.  We will also increase Lamictal to 100 mg daily.  He will continue Zoloft 100 mg daily, trazodone 1 mg at bedtime, hydroxyzine 50 mg 2 p.o. at bedtime.  We will follow up in 4 to 5 weeks.  We discussed safety concerns at any time having active suicidal thoughts or  homicidal thought that he need to call 911 or go to local emergency room.  I also reviewed his blood work results and discussed exercise, walking and breathing technique.  His blood sugar has increased from the past.  Follow Up Instructions:    I discussed the assessment and treatment plan with the patient. The patient was provided an opportunity to ask questions and all were answered. The patient agreed with the plan and demonstrated an understanding of the instructions.   The patient was advised to call back or seek an in-person evaluation if the symptoms worsen or if the condition fails to improve as anticipated.  Collaboration of Care: Other provider involved in patient's care AEB notes are available in epic to review.  Patient/Guardian was advised Release of Information must be obtained prior to any record release in order to collaborate their care with an outside provider. Patient/Guardian was advised if they have not already done so to contact the registration department to sign all necessary forms in order for [place] to release information regarding their care.   Consent: Patient/Guardian gives verbal consent for treatment and assignment of benefits for services provided during this visit. Patient/Guardian expressed understanding and agreed to proceed.    I provided 30 minutes of non-face-to-face time during this encounter.   Cleotis Nipper, MD

## 2022-01-26 ENCOUNTER — Encounter: Payer: Self-pay | Admitting: Family Medicine

## 2022-02-08 DIAGNOSIS — G4733 Obstructive sleep apnea (adult) (pediatric): Secondary | ICD-10-CM | POA: Diagnosis not present

## 2022-02-14 ENCOUNTER — Other Ambulatory Visit (HOSPITAL_COMMUNITY): Payer: Self-pay | Admitting: Psychiatry

## 2022-02-14 DIAGNOSIS — F330 Major depressive disorder, recurrent, mild: Secondary | ICD-10-CM

## 2022-02-18 ENCOUNTER — Other Ambulatory Visit: Payer: Self-pay | Admitting: Family Medicine

## 2022-02-18 NOTE — Telephone Encounter (Signed)
Pt wants refill on Semglee

## 2022-02-25 ENCOUNTER — Other Ambulatory Visit: Payer: Self-pay | Admitting: Family Medicine

## 2022-02-25 ENCOUNTER — Encounter (HOSPITAL_COMMUNITY): Payer: Self-pay | Admitting: Psychiatry

## 2022-02-25 ENCOUNTER — Telehealth (HOSPITAL_BASED_OUTPATIENT_CLINIC_OR_DEPARTMENT_OTHER): Payer: BC Managed Care – PPO | Admitting: Psychiatry

## 2022-02-25 ENCOUNTER — Other Ambulatory Visit (HOSPITAL_COMMUNITY): Payer: Self-pay | Admitting: Psychiatry

## 2022-02-25 DIAGNOSIS — F330 Major depressive disorder, recurrent, mild: Secondary | ICD-10-CM

## 2022-02-25 DIAGNOSIS — I10 Essential (primary) hypertension: Secondary | ICD-10-CM

## 2022-02-25 DIAGNOSIS — F411 Generalized anxiety disorder: Secondary | ICD-10-CM | POA: Diagnosis not present

## 2022-02-25 MED ORDER — SERTRALINE HCL 100 MG PO TABS: ORAL_TABLET | ORAL | 0 refills | Status: DC

## 2022-02-25 MED ORDER — HYDROXYZINE PAMOATE 50 MG PO CAPS: ORAL_CAPSULE | ORAL | 0 refills | Status: DC

## 2022-02-25 MED ORDER — LAMOTRIGINE 200 MG PO TABS: 200.0000 mg | ORAL_TABLET | Freq: Every day | ORAL | 0 refills | Status: DC

## 2022-02-25 MED ORDER — TRAZODONE HCL 100 MG PO TABS: 100.0000 mg | ORAL_TABLET | Freq: Every evening | ORAL | 0 refills | Status: DC | PRN

## 2022-02-25 NOTE — Progress Notes (Signed)
Virtual Visit via Telephone Note  I connected with Matthew Hutchinson on 02/25/22 at  3:40 PM EDT by telephone and verified that I am speaking with the correct person using two identifiers.  Location: Patient: Home Provider: Office   I discussed the limitations, risks, security and privacy concerns of performing an evaluation and management service by telephone and the availability of in person appointments. I also discussed with the patient that there may be a patient responsible charge related to this service. The patient expressed understanding and agreed to proceed.   History of Present Illness: Patient is evaluated by phone session.  On the last visit we increased Lamictal from 150mg  to 200 mg because he was having a lot of irritability, anger and mood swings.  He noticed much improvement.  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  He denies any severe anger.  He is tolerating Lamictal 200 along with other medication and reported no rash, itching, tremors or shakes.  He still have chronic stress but he is handling much better than he anticipated.  He is also trying to focus on his diet and lately he notices blood sugar is much better.  Denies any hallucination, paranoia, suicidal thoughts.  He is using CPAP.  He does not feel as tired and fatigue as getting good sleep at night.  He is does not sleep during the day.  Like to keep his current medication.   Past Psychiatric History:  H/O inpatient at [facility] for overdose on Ambien.  H/O overdose on Ambien 2 other times but did not require inpatient treatment.  H/O of mood swing, impulsive behavior, speeding ticket, anger, road rage.  No history of psychosis or hallucination.  Psychiatric Specialty Exam: Physical Exam  Review of Systems  Weight (!) 354 lb (160.6 kg).There is no height or weight on file to calculate BMI.  General Appearance: NA  Eye Contact:  NA  Speech:  Clear and Coherent and Normal Rate  Volume:   Normal  Mood:  Anxious and Irritable  Affect:  NA  Thought Process:  Goal Directed  Orientation:  Full (Time, Place, and Person)  Thought Content:  Rumination  Suicidal Thoughts:  No  Homicidal Thoughts:  No  Memory:  Immediate;   Good Recent;   Good Remote;   Good  Judgement:  Good  Insight:  Good  Psychomotor Activity:  NA  Concentration:  Concentration: Good and Attention Span: Good  Recall:  Good  Fund of Knowledge:  Good  Language:  Good  Akathisia:  No  Handed:  Right  AIMS (if indicated):     Assets:  Communication Skills Desire for Improvement Housing Social Support Talents/Skills Transportation  ADL's:  Intact  Cognition:  WNL  Sleep:         Assessment and Plan: Major depressive disorder, recurrent.  Anxiety.  Patient doing better since we increased the Lamictal from 150mg  to 200 mg.  He is tolerating his medication and denies any side effects.  We will continue Zoloft 100 mg daily, trazodone 100 mg at bedtime, hydroxyzine 50 mg 2 times a day.  Recommended to call [facility] back with any question or any concern.  He is going to see a therapist first time next week in our office.  I encouraged to keep that appointment.  Follow-up in 3 months.  Follow Up Instructions:    I discussed the assessment and treatment plan with the patient. The patient was provided an opportunity to ask questions  and all were answered. The patient agreed with the plan and demonstrated an understanding of the instructions.   The patient was advised to call back or seek an in-person evaluation if the symptoms worsen or if the condition fails to improve as anticipated.  Collaboration of Care: Other provider involved in patient's care AEB notes are in epic to review.  Patient/Guardian was advised Release of Information must be obtained prior to any record release in order to collaborate their care with an outside provider. Patient/Guardian was advised if they have not already done so to contact  the registration department to sign all necessary forms in order for [place] to release information regarding their care.   Consent: Patient/Guardian gives verbal consent for treatment and assignment of benefits for services provided during this visit. Patient/Guardian expressed understanding and agreed to proceed.    I provided 16 minutes of non-face-to-face time during this encounter.   Cleotis Nipper, MD

## 2022-03-03 ENCOUNTER — Ambulatory Visit (INDEPENDENT_AMBULATORY_CARE_PROVIDER_SITE_OTHER): Payer: BC Managed Care – PPO | Admitting: Licensed Clinical Social Worker

## 2022-03-03 DIAGNOSIS — F330 Major depressive disorder, recurrent, mild: Secondary | ICD-10-CM | POA: Diagnosis not present

## 2022-03-03 DIAGNOSIS — F411 Generalized anxiety disorder: Secondary | ICD-10-CM
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
## 2022-03-13 ENCOUNTER — Other Ambulatory Visit: Payer: Self-pay | Admitting: Family Medicine

## 2022-03-13 ENCOUNTER — Other Ambulatory Visit (HOSPITAL_COMMUNITY): Payer: Self-pay | Admitting: Psychiatry

## 2022-03-13 DIAGNOSIS — G8929 Other chronic pain: Secondary | ICD-10-CM

## 2022-03-13 DIAGNOSIS — F330 Major depressive disorder, recurrent, mild: Secondary | ICD-10-CM

## 2022-03-16 ENCOUNTER — Telehealth: Payer: Self-pay | Admitting: Family Medicine

## 2022-03-16 DIAGNOSIS — E118 Type 2 diabetes mellitus with unspecified complications: Secondary | ICD-10-CM | POA: Diagnosis not present

## 2022-03-16 DIAGNOSIS — Z794 Long term (current) use of insulin: Secondary | ICD-10-CM | POA: Diagnosis not present

## 2022-03-16 DIAGNOSIS — I10 Essential (primary) hypertension: Secondary | ICD-10-CM | POA: Diagnosis not present

## 2022-03-16 DIAGNOSIS — R609 Edema, unspecified: Secondary | ICD-10-CM | POA: Diagnosis not present

## 2022-03-16 DIAGNOSIS — S91001A Unspecified open wound, right ankle, initial encounter: Secondary | ICD-10-CM | POA: Diagnosis not present

## 2022-03-17 DIAGNOSIS — E118 Type 2 diabetes mellitus with unspecified complications: Secondary | ICD-10-CM | POA: Diagnosis not present

## 2022-03-17 DIAGNOSIS — R609 Edema, unspecified: Secondary | ICD-10-CM | POA: Diagnosis not present

## 2022-03-17 DIAGNOSIS — S91001A Unspecified open wound, right ankle, initial encounter: Secondary | ICD-10-CM | POA: Diagnosis not present

## 2022-03-17 DIAGNOSIS — I10 Essential (primary) hypertension: Secondary | ICD-10-CM | POA: Diagnosis not present

## 2022-03-17 NOTE — Telephone Encounter (Signed)
error 

## 2022-03-31 ENCOUNTER — Ambulatory Visit (INDEPENDENT_AMBULATORY_CARE_PROVIDER_SITE_OTHER): Payer: BC Managed Care – PPO | Admitting: Family Medicine

## 2022-03-31 ENCOUNTER — Encounter: Payer: Self-pay | Admitting: Family Medicine

## 2022-03-31 ENCOUNTER — Ambulatory Visit: Payer: BC Managed Care – PPO | Admitting: Family Medicine

## 2022-03-31 VITALS — BP 132/70 | HR 78 | Temp 98.5°F | Ht 74.5 in | Wt 350.8 lb

## 2022-03-31 DIAGNOSIS — E1165 Type 2 diabetes mellitus with hyperglycemia: Secondary | ICD-10-CM

## 2022-03-31 DIAGNOSIS — Z794 Long term (current) use of insulin: Secondary | ICD-10-CM | POA: Diagnosis not present

## 2022-03-31 DIAGNOSIS — Z23 Encounter for immunization: Secondary | ICD-10-CM

## 2022-03-31 LAB — POCT GLYCOSYLATED HEMOGLOBIN (HGB A1C): Hemoglobin A1C: 7.5 % — AB (ref 4.0–5.6)

## 2022-03-31 MED ORDER — FREESTYLE LIBRE 3 SENSOR MISC: 1.0000 | 1 refills | Status: DC

## 2022-03-31 NOTE — Patient Instructions (Signed)
I will let you know the results of your A1c later today.  No medication changes for now.  If the Elenor Legato 3 is not covered, we could try either Dexcom or libre 2.  Keep me posted.

## 2022-03-31 NOTE — Progress Notes (Signed)
Subjective:  Patient ID: Matthew Hutchinson, male    DOB: [birth year]  Age: 61 y.o. MRN: [id number]  CC:  Chief Complaint  Patient presents with   Diabetes    Pt states all is well Pt wants more information about glucose meter that reads through the phone    HPI RUDY LUHMANN presents for   Diabetes: Insulin-dependent with history of previous hyperglycemia, obesity. Metformin 500 mg twice daily, Semglee dosing was decreasing at last visit at 60 units/day as improved readings.  Trulicity 3 mg/week.  Minimal bump in A1c of 7.1 on June 15.  We did discuss some dietary indiscretion at times during stressful times.  Followed by psychiatry. Doing better on higher dose of meds from psychiatry, and seeing therapist. Not planning on moving at this time.  Numbers were doing well, but up to 200's past 4 days - no change in diet, no recent illness. Some leg pains that have increased with cooler weather.  Some white rice few days ago.  Home readings fasting:120-130 usually Home readings postprandial: 150-180.  Interested in continuous glucose monitor.unsure of cost with insurance.  He is on Zocor 20 mg daily for statin, Norvasc and Hyzaar for hypertension with ARB in Hyzaar.   Microalbumin: Normal ratio 04/02/2021 Optho, foot exam, pneumovax: Due for foot exam, flu vaccine.  Lab Results  Component Value Date   HGBA1C 7.1 (H) 12/24/2021   HGBA1C 6.2 (A) 09/23/2021   HGBA1C 6.5 06/24/2021   Lab Results  Component Value Date   MICROALBUR 1.7 04/02/2021   LDLCALC 67 12/24/2021   CREATININE 0.98 12/24/2021    History Patient Active Problem List   Diagnosis Date Noted   Newly diagnosed diabetes ([place]) 07/27/2018   Morbid obesity ([place]) 02/02/2017   Chronic pain due to trauma 06/25/2013   Depression, recurrent ([place]) 11/09/2012   CAD (coronary artery disease) 02/21/2012   Lipid disorder 02/21/2012   Angina pectoris 09/27/2011   Obstructive sleep apnea 05/20/2010   Essential  hypertension 04/17/2010   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/17/2010   WEIGHT GAIN, ABNORMAL 04/17/2010   Past Medical History:  Diagnosis Date   Allergy    Anxiety    Arthritis    COPD (chronic obstructive pulmonary disease) ([place])    Depression    Emphysema of lung ([place])    Hyperlipidemia    Morbid obesity (HCC)    OSA (obstructive sleep apnea)    uses CPAP nightly   PONV (postoperative nausea and vomiting)    Unspecified essential hypertension    Past Surgical History:  Procedure Laterality Date   ESOPHAGOGASTRODUODENOSCOPY (EGD) WITH PROPOFOL N/A 10/16/2021   Procedure: ESOPHAGOGASTRODUODENOSCOPY (EGD) WITH PROPOFOL;  Surgeon: Carol Ada, MD;  Location: [facility];  Service: Endoscopy;  Laterality: N/A;   INSERTION OF MESH N/A 09/18/2015   Procedure: INSERTION OF MESH;  Surgeon: Coralie Keens, MD;  Location: [facility];  Service: General;  Laterality: N/A;   LEG SURGERY Left    SAVORY DILATION N/A 10/16/2021   Procedure: SAVORY DILATION;  Surgeon: Carol Ada, MD;  Location: [facility];  Service: Endoscopy;  Laterality: N/A;   SHOULDER ARTHROSCOPY Left    TONSILLECTOMY     UMBILICAL HERNIA REPAIR N/A 09/18/2015   Procedure: HERNIA REPAIR UMBILICAL ADULT WITH MESH ;  Surgeon: Coralie Keens, MD;  Location: [facility];  Service: General;  Laterality: N/A;   Allergies  Allergen Reactions   Penicillins Anaphylaxis   Valsartan Itching and Swelling   Prior to Admission  medications   Medication Sig Start Date End Date Taking? Authorizing Provider  amLODipine (NORVASC) 5 MG tablet TAKE 1 TABLET (5 MG TOTAL) BY MOUTH DAILY. 02/25/22  Yes Wendie Agreste, MD  aspirin 81 MG tablet Take 1 tablet (81 mg total) by mouth daily. 11/13/12  Yes Patrecia Pour, NP  b complex vitamins tablet Take 1 tablet by mouth daily.   Yes [provider]  blood glucose meter kit and supplies Dispense based on patient and insurance preference. Use 2-3 times per day.  05/09/18  Yes Wendie Agreste, MD  calcium gluconate 500 MG tablet Take 500 mg by mouth daily.   Yes [provider]  Carboxymethylcellul-Glycerin (LUBRICATING EYE DROPS OP) Place 1 drop into both eyes daily as needed (dry eyes).   Yes [provider]  Cholecalciferol (VITAMIN D) 2000 UNITS tablet Take 1 tablet (2,000 Units total) by mouth daily. 11/13/12  Yes Patrecia Pour, NP  gabapentin (NEURONTIN) 300 MG capsule TAKE 2 CAPSULES BY MOUTH TWICE A DAY 03/16/22  Yes Wendie Agreste, MD  GARLIC PO Take 537 mg by mouth daily.   Yes [provider]  hydrOXYzine (VISTARIL) 50 MG capsule TAKE 2 CAPSULES BY MOUTH AT BEDTIME 02/25/22  Yes Arfeen, Arlyce Harman, MD  Insulin Pen Needle (TECHLITE PEN NEEDLES) 32G X 4 MM MISC Use once daily with lantus. Dx E11.9, Z79.4 06/11/21  Yes Wendie Agreste, MD  lamoTRIgine (LAMICTAL) 200 MG tablet Take 1 tablet (200 mg total) by mouth daily. 02/25/22  Yes Arfeen, Arlyce Harman, MD  losartan-hydrochlorothiazide (HYZAAR) 100-25 MG tablet TAKE 1 TABLET BY MOUTH EVERY DAY 01/13/22  Yes Wendie Agreste, MD  Lutein 40 MG CAPS Take 40 mg by mouth at bedtime.   Yes [provider]  magnesium gluconate (MAGONATE) 500 MG tablet Take 500 mg by mouth daily.   Yes [provider]  metFORMIN (GLUCOPHAGE) 500 MG tablet TAKE 1 TABLET BY MOUTH 2 TIMES DAILY WITH A MEAL. 12/10/21  Yes Wendie Agreste, MD  Multiple Vitamin (MULTIVITAMIN) capsule Take 1 capsule by mouth daily. 11/13/12  Yes Lord, Asa Saunas, NP  nitroGLYCERIN (NITROSTAT) 0.4 MG SL tablet Place 1 tablet (0.4 mg total) under the tongue every 5 (five) minutes as needed for chest pain. If chest pain not resolved, after 3 doses 5 minutes apart--call 911 Patient taking differently: Place 0.4 mg under the tongue See admin instructions. Take 0.4 mg as needed for hiccups 11/13/12  Yes Patrecia Pour, NP  Potassium 99 MG TABS Take 99 mg by mouth at bedtime.   Yes [provider]  SEMGLEE, YFGN,  100 UNIT/ML Pen INJECT 62 UNITS INTO THE SKIN DAILY. 02/18/22  Yes Wendie Agreste, MD  sertraline (ZOLOFT) 100 MG tablet TAKE 1 TABLET BY MOUTH DAILY 02/25/22  Yes Arfeen, Arlyce Harman, MD  Shark Cartilage 740 MG CAPS Take 1 capsule by mouth 3 (three) times daily.   Yes [provider]  simvastatin (ZOCOR) 20 MG tablet TAKE 1 TABLET BY MOUTH AT BEDTIME 09/11/21  Yes Maximiano Coss, NP  traMADol (ULTRAM) 50 MG tablet Take 50 mg by mouth daily as needed for severe pain.   Yes [provider]  traZODone (DESYREL) 100 MG tablet Take 1 tablet (100 mg total) by mouth at bedtime as needed for sleep. 02/25/22  Yes Arfeen, Arlyce Harman, MD  TRULICITY 3 SM/2.7MB SOPN INJECT 3 MG SUBCUTANEOUSLY ONCE A WEEK AS DIRECTED 09/22/21  Yes Wendie Agreste, MD  methocarbamol (ROBAXIN) 750 MG tablet TAKE 1 TABLET BY MOUTH 2-3 TIMES DAILY AS NEEDED FOR MUSCLE SPASMS. 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 Education: college. Exercise: No.   Social Determinants of Health   Financial Resource Strain: Not on file  Food Insecurity: Not on file  Transportation Needs:  Not on file  Physical Activity: Not on file  Stress: Not on file  Social Connections: Not on file  Intimate Partner Violence: Not on file    Review of Systems Per HPI.   Objective:   Vitals:   03/31/22 1103  BP: 132/70  Pulse: 78  Temp: 98.5 F (36.9 C)  SpO2: 94%  Weight: (!) 350 lb 12.8 oz (159.1 kg)  Height: 6' 2.5" (1.892 m)    Physical Exam Vitals reviewed.  Constitutional:      Appearance: He is well-developed.  HENT:     Head: Normocephalic and atraumatic.  Neck:     Vascular: No carotid bruit or JVD.  Cardiovascular:     Rate and Rhythm: Normal rate and regular rhythm.     Heart sounds: Normal heart sounds. No murmur heard. Pulmonary:     Effort: Pulmonary effort is normal.     Breath sounds: Normal breath sounds. No rales.  Musculoskeletal:     Right lower leg: No edema.     Left lower leg: No edema.  Skin:    General: Skin is warm and dry.  Neurological:     Mental Status: He is alert and oriented to person, place, and time.  Psychiatric:        Mood and Affect: Mood normal.     Results for orders placed or performed in visit on 03/31/22  POCT glycosylated hemoglobin (Hb A1C)  Result Value Ref Range   Hemoglobin A1C 7.5 (A) 4.0 - 5.6 %   HbA1c POC (<> result, manual entry)     HbA1c, POC (prediabetic range)     HbA1c, POC (controlled diabetic range)        Assessment & Plan:  NYGEL PROKOP is a 61 y.o. male . Type 2 diabetes mellitus with hyperglycemia, with long-term current use of insulin (HCC) - Plan: Continuous Blood Gluc Sensor (FREESTYLE LIBRE 3 SENSOR) MISC, POCT glycosylated hemoglobin (Hb A1C) Decreased control with slight increase in A1c.  Few recent elevated readings but otherwise has been fairly stable at home.  Option of higher dose of GLP-1,  will contact patient to discuss that change versus higher dose of insulin.  Recheck next 3 months.  Continuous glucose monitor may also be helpful in monitoring, ordered.  Need for  influenza vaccination - Plan: Flu Vaccine QUAD 6+ mos PF IM (Fluarix Quad PF)   Meds ordered this encounter  Medications   Continuous Blood Gluc Sensor (FREESTYLE LIBRE 3 SENSOR) MISC    Sig: 1 Application by Does not apply route every 14 (fourteen) days. Place 1 sensor on the skin every 14 days. Use to check glucose continuously    Dispense:  6 each    Refill:  1   Patient Instructions  I will let you know the results of your A1c later today.  No medication changes for now.  If the Elenor Legato 3 is not covered, we could try either Dexcom or libre 2.  Keep me posted.      Signed,   Merri Ray, MD [place], [facility] 03/31/22 11:38 AM

## 2022-03-31 NOTE — Progress Notes (Unsigned)
HPI M former smoker followed for OSA, complicated by Morbid Obesity, HBP, CAD, COPD, DM2, Depression, Chronic Pain NPSG 06/08/09 AHI 14.8/ hr, desaturation to 83%, body weight 298 lbs Spirometry 10/16/12- moderate restriction with mild obstruction likely --------------------------------------------------------------------------------   03/30/21- 60 yoM former smoker followed for OSA, complicated by Morbid Obesity, HBP, CAD, COPD, DM2, Depression, Chronic Pain CPAP  auto 5-20   / [employer] Dreamstation Auto  Download-compliance 97%, AHI 1.8/ hr Body weight today-355 lbs Covid vax-2 Phizer, 1 Moderna Flu vax- had Awaiting replacement DreamStation soon. Had a couple of very mild bronchitis episodes but overall breathing is stable.  Chronic hiccups have returned.  CXR 03/25/21- IMPRESSION: No active cardiopulmonary disease.  04/01/22-  42 yoM former smoker(52 pkyrs) followed for OSA, complicated by Morbid Obesity, HBP, CAD, COPD, DM2, Depression, Chronic Pain CPAP  auto 5-20   / [employer] Dreamstation Auto  Download-compliance not available Body weight today-350 lbs Covid vax-5 Phizer,  Flu vax-had Got replacement Dream Station machine and pressure ok with regular use. His preferred brand of nasal pillows is no longer being made apparently. 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 Apparently has never tried thorazine. He will ask his doctors about this. [facility] (anesthesiologists) reportedly also treat this.  ROS-see HPI   + = positive Constitutional:    weight loss, night sweats, fevers, chills, fatigue, lassitude. HEENT:    +headaches, difficulty swallowing, tooth/dental problems, sore throat,       sneezing, itching, ear ache, nasal congestion, post nasal drip, snoring CV:    +chest pain, orthopnea, PND, swelling in lower extremities, anasarca,                                   dizziness,  palpitations Resp:  + shortness of breath with exertion or at rest.                productive cough,   +non-productive cough, coughing up of blood.              change in color of mucus.  wheezing.   Skin:    rash or lesions. GI:  No-   heartburn, indigestion, abdominal pain, nausea, vomiting, diarrhea, +hiccups,                change in bowel habits, loss of appetite GU: dysuria, change in color of urine, no urgency or frequency.   flank pain. MS:   +joint pain, stiffness, decreased range of motion, back pain. Neuro-     nothing unusual Psych:  change in mood or affect.  +depression or anxiety.   memory loss.  OBJ- Physical Exam, + morbidly obese General- Alert, Oriented, Affect-appropriate, Distress- none acute Skin- rash-none, lesions- none, excoriation- none Lymphadenopathy- none Head- atraumatic            Eyes- Gross vision intact, PERRLA, conjunctivae and secretions clear            Ears- Hearing, canals-normal            Nose- Clear, no-Septal dev, mucus, polyps, erosion, perforation             Throat- Mallampati IV , mucosa clear , drainage- none, tonsils- atrophic Neck- flexible , trachea midline, no stridor , thyroid nl, carotid no bruit Chest - symmetrical excursion , unlabored  Heart/CV- RRR ,  murmur + 1-2/6 S, no gallop  , no rub, nl s1 s2                           - JVD- none , edema+1, stasis changes- none, varices- none           Lung- +few crackles on R, wheeze- none, cough- none , dullness-none, rub- none           Chest wall-  Abd-  Br/ Gen/ Rectal- Not done, not indicated Extrem- cyanosis- none, clubbing, none, atrophy- none, strength- nl, +cane Neuro- grossly intact to observation

## 2022-04-01 ENCOUNTER — Ambulatory Visit (INDEPENDENT_AMBULATORY_CARE_PROVIDER_SITE_OTHER): Payer: BC Managed Care – PPO | Admitting: Internal Medicine

## 2022-04-01 ENCOUNTER — Encounter: Payer: Self-pay | Admitting: Family Medicine

## 2022-04-01 ENCOUNTER — Encounter: Payer: Self-pay | Admitting: Internal Medicine

## 2022-04-01 DIAGNOSIS — J449 Chronic obstructive pulmonary disease, unspecified: Secondary | ICD-10-CM

## 2022-04-01 DIAGNOSIS — R066 Hiccough: Secondary | ICD-10-CM | POA: Diagnosis not present

## 2022-04-01 DIAGNOSIS — G4733 Obstructive sleep apnea (adult) (pediatric): Secondary | ICD-10-CM | POA: Diagnosis not present

## 2022-04-01 NOTE — Patient Instructions (Signed)
We can continue CPAP auto 5-20  We discussed possible trial of thorazine for hiccups. You can get your doctors to look it up and see if its would be worth trying.  Please call if we can help

## 2022-04-01 NOTE — Assessment & Plan Note (Signed)
Working with GI I suggested he ask about thorazine- an older treatment. Also [facility] may treat this with various modalities.

## 2022-04-01 NOTE — Assessment & Plan Note (Signed)
Benefits from CPAP- Plan continue auto 5-20 

## 2022-04-01 NOTE — Assessment & Plan Note (Signed)
Severity unclear. He minimizes symptoms without inhalers. Plan- consider PFT

## 2022-04-02 NOTE — Telephone Encounter (Signed)
[employer] and they reported if we change to [facility] it is preferred and no forms are required

## 2022-04-03 MED ORDER — DEXCOM G5 MOB/G4 PLAT SENSOR MISC
3 refills | Status: DC
Start: 2022-04-03 — End: 2022-06-23

## 2022-04-03 NOTE — Telephone Encounter (Signed)
Dexcom ordered  

## 2022-04-06 NOTE — Telephone Encounter (Signed)
Per previous instructions I ordered Dexcom and specifically on that prescription noted to supplement with any version of Dexcom for insurance coverage.  Appears patient is requesting the libre 3, let me know if there is information needed from me for prior authorization.  Thanks.

## 2022-04-06 NOTE — Telephone Encounter (Signed)
PA submitted via fax on [facility] form they had sent for the [place] 3

## 2022-04-20 ENCOUNTER — Encounter (HOSPITAL_COMMUNITY): Payer: Self-pay

## 2022-04-20 ENCOUNTER — Ambulatory Visit (INDEPENDENT_AMBULATORY_CARE_PROVIDER_SITE_OTHER): Payer: BC Managed Care – PPO | Admitting: Licensed Clinical Social Worker

## 2022-04-20 DIAGNOSIS — F411 Generalized anxiety disorder: Secondary | ICD-10-CM | POA: Diagnosis not present

## 2022-04-20 DIAGNOSIS — F330 Major depressive disorder, recurrent, mild: Secondary | ICD-10-CM | POA: Diagnosis not present

## 2022-04-20 NOTE — Plan of Care (Signed)
  Problem: Depression Goal:  Decrease depressive symptoms and improve levels of effective functioning-pt reports a decrease in overall depression symptoms 3 out of 5 sessions documented.  Outcome: Progressing Goal: Develop healthy thinking patterns and beliefs about self, others, and the world that lead to the alleviation and help prevent the relapse of depression per self report 3 out of 5 sessions documented.   Outcome: Progressing Intervention: REVIEW PLEASE SKILLS (TREAT PHYSICAL ILLNESS, BALANCE EATING, AVOID MOOD-ALTERING SUBSTANCES, BALANCE SLEEP AND GET EXERCISE) WITH Matthew Hutchinson Note: reviewed Intervention: Encourage verbalization of feelings/concerns/expectations Note: Allowed/explored  Intervention: Provide grief and bereavement support Note: Explored/provided   Problem: Anxiety Goal:  Reduce overall frequency, intensity, and duration of the anxiety so that daily functioning is not impaired per pt self report 3 out of 5 sessions documented.   Outcome: Progressing Goal: Learn and implement coping skills that result in a reduction of anxiety and worry, and improve daily functioning per pt report 3 out of 5 sessions documented  Outcome: Progressing Intervention: Encourage patient to identify triggers Note: Allowed pt to identify and reviewed coping skills pt using to manage
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
## 2022-04-26 ENCOUNTER — Encounter: Payer: Self-pay | Admitting: Family Medicine

## 2022-04-27 ENCOUNTER — Encounter: Payer: Self-pay | Admitting: Family Medicine

## 2022-05-06 ENCOUNTER — Encounter: Payer: Self-pay | Admitting: Family Medicine

## 2022-05-06 ENCOUNTER — Ambulatory Visit (INDEPENDENT_AMBULATORY_CARE_PROVIDER_SITE_OTHER): Payer: BC Managed Care – PPO | Admitting: Family Medicine

## 2022-05-06 VITALS — BP 122/68 | HR 74 | Temp 98.0°F | Ht 74.0 in | Wt 356.4 lb

## 2022-05-06 DIAGNOSIS — R066 Hiccough: Secondary | ICD-10-CM | POA: Diagnosis not present

## 2022-05-06 DIAGNOSIS — H938X1 Other specified disorders of right ear: Secondary | ICD-10-CM

## 2022-05-06 NOTE — Patient Instructions (Addendum)
Continue to discuss treatments for hiccups with gastroenterology but I will refer you to ENT to discuss ear sounds and hiccups if needed as well.  Take care.

## 2022-05-06 NOTE — Progress Notes (Signed)
Subjective:  Patient ID: Matthew Hutchinson, male    DOB: 1960-07-14  Age: 61 y.o. MRN: [id number]  CC:  Chief Complaint  Patient presents with   Ear Exam     Pt would like to have a referral to an ENT   Depression    PHQ9 - 7    HPI Matthew Hutchinson presents for   Abnormal ear sounds See my chart message from October 16.  History of chronic hiccups that have been evaluated by multiple specialists, various treatments without relief..  Has noted gurgling sound in his left ear in the past, now has noted sound in the right ear.  Started about 4 weeks ago. No pain in ears. No change in hearing, no ear discharge.  Tried Q tip in ears after shower, no other treatments.  No nasal congestion, runny nose or sneezing.  No recent airplane travel or travel in altitude/mountains.  No jaw popping.   He had requested referral to ENT previously for hiccups, referral was ordered in September 2022 but declined by ENT at that time as they did not see patients for hiccups. Nitroglycerin helps immediately, but return and HA if takes more than 506 per day or after initial dose. Rx by GI - Dr. Benson Norway. 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 Option of botix, but not covered by insurance.  Increased hiccups past month, 5 episodes.      05/06/2022    2:25 PM 03/31/2022   11:01 AM 03/04/2022    5:36 PM 12/24/2021    3:50 PM 09/23/2021    3:46 PM  Depression screen PHQ 2/9  Decreased Interest 1 0  3 1  Down, Depressed, Hopeless _0 PHQ - 2 Score _1 Altered sleeping 0 0  3 2  Tired, decreased energy _2 Change in appetite 1 0  3 2  Feeling bad or failure about yourself  0 0  0 0  Trouble concentrating _3 Moving slowly or fidgety/restless 1 0  3 2  Suicidal thoughts 0 0  0 0  PHQ-9 Score _4 Information is confidential and restricted. Go to Review Flowsheets to unlock data.   History of depression on meds  followed by psychiatry, no SI.  PHQ noted as above   History Patient Active Problem List   Diagnosis Date Noted   Hiccups 04/01/2022   Newly diagnosed diabetes ([place]) 07/27/2018   Morbid obesity ([place]) 02/02/2017   Chronic pain due to trauma 06/25/2013   Depression, recurrent ([place]) 11/09/2012   CAD (coronary artery disease) 02/21/2012   Lipid disorder 02/21/2012   Angina pectoris 09/27/2011   Obstructive sleep apnea 05/20/2010   Essential hypertension 04/17/2010   COPD mixed type ([place]) 04/17/2010   WEIGHT GAIN, ABNORMAL 04/17/2010   Past Medical History:  Diagnosis Date   Allergy    Anxiety    Arthritis    COPD (chronic obstructive pulmonary disease) ([place])    Depression    Emphysema of lung (HCC)    Hyperlipidemia    Morbid obesity ([facility])    OSA (obstructive sleep apnea)    uses CPAP nightly   PONV (postoperative nausea and vomiting)    Unspecified essential hypertension    Past Surgical History:  Procedure Laterality Date   ESOPHAGOGASTRODUODENOSCOPY (EGD) WITH PROPOFOL N/A 10/16/2021   Procedure: ESOPHAGOGASTRODUODENOSCOPY (  EGD) WITH PROPOFOL;  Surgeon: Carol Ada, MD;  Location: [facility];  Service: Endoscopy;  Laterality: N/A;   INSERTION OF MESH N/A 09/18/2015   Procedure: INSERTION OF MESH;  Surgeon: Coralie Keens, MD;  Location: [facility];  Service: General;  Laterality: N/A;   LEG SURGERY Left    SAVORY DILATION N/A 10/16/2021   Procedure: SAVORY DILATION;  Surgeon: Carol Ada, MD;  Location: [facility];  Service: Endoscopy;  Laterality: N/A;   SHOULDER ARTHROSCOPY Left    TONSILLECTOMY     UMBILICAL HERNIA REPAIR N/A 09/18/2015   Procedure: HERNIA REPAIR UMBILICAL ADULT WITH MESH ;  Surgeon: Coralie Keens, MD;  Location: [facility];  Service: General;  Laterality: N/A;   Allergies  Allergen Reactions   Penicillins Anaphylaxis   Valsartan Itching and Swelling   Prior to Admission medications   Medication Sig Start Date  End Date Taking? Authorizing Provider  amLODipine (NORVASC) 5 MG tablet TAKE 1 TABLET (5 MG TOTAL) BY MOUTH DAILY. 02/25/22  Yes Wendie Agreste, MD  aspirin 81 MG tablet Take 1 tablet (81 mg total) by mouth daily. 11/13/12  Yes Patrecia Pour, NP  b complex vitamins tablet Take 1 tablet by mouth daily.   Yes [provider]  blood glucose meter kit and supplies Dispense based on patient and insurance preference. Use 2-3 times per day. 05/09/18  Yes Wendie Agreste, MD  calcium gluconate 500 MG tablet Take 500 mg by mouth daily.   Yes [provider]  Cholecalciferol (VITAMIN D) 2000 UNITS tablet Take 1 tablet (2,000 Units total) by mouth daily. 11/13/12  Yes Patrecia Pour, NP  Continuous Blood Gluc Sensor (FREESTYLE LIBRE 3 SENSOR) MISC by Does not apply route.   Yes [provider]  gabapentin (NEURONTIN) 300 MG capsule TAKE 2 CAPSULES BY MOUTH TWICE A DAY 03/16/22  Yes Wendie Agreste, MD  GARLIC PO Take 852 mg by mouth daily.   Yes [provider]  hydrOXYzine (VISTARIL) 50 MG capsule TAKE 2 CAPSULES BY MOUTH AT BEDTIME 02/25/22  Yes Arfeen, Arlyce Harman, MD  Insulin Pen Needle (TECHLITE PEN NEEDLES) 32G X 4 MM MISC Use once daily with lantus. Dx E11.9, Z79.4 06/11/21  Yes Wendie Agreste, MD  lamoTRIgine (LAMICTAL) 200 MG tablet Take 1 tablet (200 mg total) by mouth daily. 02/25/22  Yes Arfeen, Arlyce Harman, MD  losartan-hydrochlorothiazide (HYZAAR) 100-25 MG tablet TAKE 1 TABLET BY MOUTH EVERY DAY 01/13/22  Yes Wendie Agreste, MD  Lutein 40 MG CAPS Take 40 mg by mouth at bedtime.   Yes [provider]  magnesium gluconate (MAGONATE) 500 MG tablet Take 500 mg by mouth daily.   Yes [provider]  metFORMIN (GLUCOPHAGE) 500 MG tablet TAKE 1 TABLET BY MOUTH 2 TIMES DAILY WITH A MEAL. 12/10/21  Yes Wendie Agreste, MD  Multiple Vitamin (MULTIVITAMIN) capsule Take 1 capsule by mouth daily. 11/13/12  Yes Lord, Asa Saunas, NP  nitroGLYCERIN (NITROSTAT) 0.4  MG SL tablet Place 1 tablet (0.4 mg total) under the tongue every 5 (five) minutes as needed for chest pain. If chest pain not resolved, after 3 doses 5 minutes apart--call 911 Patient taking differently: Place 0.4 mg under the tongue See admin instructions. Take 0.4 mg as needed for hiccups 11/13/12  Yes Patrecia Pour, NP  Potassium 99 MG TABS Take 99 mg by mouth at bedtime.   Yes [provider]  SEMGLEE, YFGN, 100 UNIT/ML Pen INJECT  62 UNITS INTO THE SKIN DAILY. 02/18/22  Yes Wendie Agreste, MD  sertraline (ZOLOFT) 100 MG tablet TAKE 1 TABLET BY MOUTH DAILY 02/25/22  Yes Arfeen, Arlyce Harman, MD  Shark Cartilage 740 MG CAPS Take 1 capsule by mouth 3 (three) times daily.   Yes [provider]  simvastatin (ZOCOR) 20 MG tablet TAKE 1 TABLET BY MOUTH AT BEDTIME 09/11/21  Yes Maximiano Coss, NP  traMADol (ULTRAM) 50 MG tablet Take 50 mg by mouth daily as needed for severe pain.   Yes [provider]  traZODone (DESYREL) 100 MG tablet Take 1 tablet (100 mg total) by mouth at bedtime as needed for sleep. 02/25/22  Yes Arfeen, Arlyce Harman, MD  TRULICITY 3 BX/0.3YB SOPN INJECT 3 MG SUBCUTANEOUSLY ONCE A WEEK AS DIRECTED 09/22/21  Yes Wendie Agreste, MD  amLODipine (NORVASC) 5 MG tablet Take 1 tablet by mouth daily.    [provider]  Carboxymethylcellul-Glycerin (LUBRICATING EYE DROPS OP) Place 1 drop into both eyes daily as needed (dry eyes). Patient not taking: Reported on 05/06/2022    [provider]  Cholecalciferol (VITAMIN D3) 1.25 MG (50000 UT) CAPS INSTILL 1 DROP BY OPHTHALMIC ROUTE 3 TIMES EVERY DAY INTO RIGHT EYE    [provider]  Continuous Blood Gluc Sensor (DEXCOM G5 MOB/G4 PLAT SENSOR) MISC Apply as instructed, change every 10 days. Patient not taking: Reported on 05/06/2022 04/03/22   Wendie Agreste, MD  gabapentin (NEURONTIN) 300 MG capsule Take 2 capsules by mouth 2 (two) times daily.    [provider]  hydrOXYzine (ATARAX) 50  MG tablet 3 tablets every day by oral route.    [provider]  insulin glargine (LANTUS SOLOSTAR) 100 UNIT/ML Solostar Pen INJECT 50 UNITS INTO THE SKIN DAILY    [provider]  lamoTRIgine (LAMICTAL) 150 MG tablet Take 1 tablet by mouth daily.    [provider]  losartan-hydrochlorothiazide (HYZAAR) 100-25 MG tablet Take 1 tablet by mouth daily.    [provider]  metFORMIN (GLUCOPHAGE) 500 MG tablet Take 1 tablet by mouth 2 (two) times daily with a meal.    [provider]  methocarbamol (ROBAXIN) 750 MG tablet TAKE 1 TABLET BY MOUTH 2-3 TIMES DAILY AS NEEDED FOR MUSCLE SPASMS. Patient not taking: Reported on 10/12/2021 07/07/21   Esterwood, Amy S, PA-C  sertraline (ZOLOFT) 100 MG tablet Take 1 tablet by mouth daily.    [provider]  simvastatin (ZOCOR) 20 MG tablet Take 1 tablet by mouth at bedtime.    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 Education: college. Exercise: No.   Social Determinants of Health   Financial Resource Strain: Not on file  Food Insecurity: Not on file  Transportation Needs: Not on file  Physical Activity: Not  on file  Stress: Not on file  Social Connections: Not on file  Intimate Partner Violence: Not on file    Review of Systems  Per HPI.  Objective:   Vitals:   05/06/22 1431  BP: 122/68  Pulse: 74  Temp: 98 F (36.7 C)  SpO2: 98%  Weight: (!) 356 lb 6.4 oz (161.7 kg)  Height: _0  (1.88 m)     Physical Exam Vitals reviewed.  Constitutional:      Appearance: He is well-developed.  HENT:     Head: Normocephalic and atraumatic.     Right Ear: Ear canal and external ear normal.     Left Ear: Tympanic membrane, ear canal and external ear normal.     Ears:     Comments: Near canals bilaterally but right canal appears to be more narrow than left, no apparent edema or erythema, no exudate in canal.  Visualized portion of top of TM on the right did not appear erythematous, but unable to see the base of the TM.  No discharge in canal.  Pinna nontender.  TMJ nontender with no popping/clicking with jaw motion    Nose: No rhinorrhea.     Mouth/Throat:     Pharynx: No oropharyngeal exudate or posterior oropharyngeal erythema.  Eyes:     Conjunctiva/sclera: Conjunctivae normal.     Pupils: Pupils are equal, round, and reactive to light.  Cardiovascular:     Rate and Rhythm: Normal rate and regular rhythm.     Heart sounds: Normal heart sounds. No murmur heard. Pulmonary:     Effort: Pulmonary effort is normal.     Breath sounds: Normal breath sounds. No wheezing, rhonchi or rales.  Abdominal:     Palpations: Abdomen is soft.     Tenderness: There is no abdominal tenderness.  Musculoskeletal:     Cervical back: Neck supple.  Lymphadenopathy:     Cervical: No cervical adenopathy.  Skin:    General: Skin is warm and dry.     Findings: No rash.  Neurological:     Mental Status: He is alert and oriented to person, place, and time.  Psychiatric:        Behavior: Behavior normal.        Assessment & Plan:  Matthew Hutchinson is a 61 y.o. male . Crackling sound in ear,  right - Plan: Ambulatory referral to ENT  Chronic hiccups - Plan: Ambulatory referral to ENT Gurgly sound of right ear, previously had noted in left ear.  Exam was reassuring.  Denies any nasal congestion, allergy symptoms or recent travel, less likely eustachian tube dysfunction.  TMJ nontender, no popping or clicking, unlikely referred pain or sound from TMJ.  Chronic hiccups but not sure that is related to the sound.  Some difficulty with treatment of hiccups due to side effects from only medication that has been effective.  Refer to ENT to evaluate right ear, hopefully can get a better look at his TM as I did have some difficulty seeing the base of that today. discuss hiccups with them as well, but encouraged to follow-up with GI regarding medication changes.  RTC precautions  No orders of the defined  types were placed in this encounter.  Patient Instructions  Continue to discuss treatments for hiccups with gastroenterology but I will refer you to ENT to discuss ear sounds and hiccups if needed as well.  Take care.     Signed,   Merri Ray, MD [facility] 05/06/22 3:09 PM

## 2022-05-09 DIAGNOSIS — G4733 Obstructive sleep apnea (adult) (pediatric): Secondary | ICD-10-CM | POA: Diagnosis not present

## 2022-05-13 DIAGNOSIS — H524 Presbyopia: Secondary | ICD-10-CM | POA: Diagnosis not present

## 2022-05-13 DIAGNOSIS — Z961 Presence of intraocular lens: Secondary | ICD-10-CM | POA: Diagnosis not present

## 2022-05-13 DIAGNOSIS — Z7984 Long term (current) use of oral hypoglycemic drugs: Secondary | ICD-10-CM | POA: Diagnosis not present

## 2022-05-13 DIAGNOSIS — Z794 Long term (current) use of insulin: Secondary | ICD-10-CM | POA: Diagnosis not present

## 2022-05-13 DIAGNOSIS — E119 Type 2 diabetes mellitus without complications: Secondary | ICD-10-CM | POA: Diagnosis not present

## 2022-05-13 LAB — HM DIABETES EYE EXAM

## 2022-05-14 ENCOUNTER — Telehealth: Payer: Self-pay | Admitting: Lab

## 2022-05-14 NOTE — Telephone Encounter (Signed)
Placed in Dr. Mancel Bale folder about patients diabetic eye exam

## 2022-05-18 ENCOUNTER — Encounter: Payer: Self-pay | Admitting: Family Medicine

## 2022-05-25 ENCOUNTER — Other Ambulatory Visit: Payer: Self-pay | Admitting: Family Medicine

## 2022-05-25 DIAGNOSIS — H903 Sensorineural hearing loss, bilateral: Secondary | ICD-10-CM | POA: Diagnosis not present

## 2022-05-25 DIAGNOSIS — E1165 Type 2 diabetes mellitus with hyperglycemia: Secondary | ICD-10-CM

## 2022-05-25 DIAGNOSIS — H938X1 Other specified disorders of right ear: Secondary | ICD-10-CM | POA: Diagnosis not present

## 2022-05-25 DIAGNOSIS — E119 Type 2 diabetes mellitus without complications: Secondary | ICD-10-CM

## 2022-05-25 DIAGNOSIS — H9313 Tinnitus, bilateral: Secondary | ICD-10-CM | POA: Diagnosis not present

## 2022-05-26 ENCOUNTER — Other Ambulatory Visit (HOSPITAL_COMMUNITY): Payer: Self-pay | Admitting: Psychiatry

## 2022-05-26 DIAGNOSIS — F330 Major depressive disorder, recurrent, mild: Secondary | ICD-10-CM

## 2022-05-27 ENCOUNTER — Encounter (HOSPITAL_COMMUNITY): Payer: Self-pay | Admitting: Psychiatry

## 2022-05-27 ENCOUNTER — Telehealth (HOSPITAL_BASED_OUTPATIENT_CLINIC_OR_DEPARTMENT_OTHER): Payer: BC Managed Care – PPO | Admitting: Psychiatry

## 2022-05-27 DIAGNOSIS — F411 Generalized anxiety disorder: Secondary | ICD-10-CM | POA: Diagnosis not present

## 2022-05-27 DIAGNOSIS — F330 Major depressive disorder, recurrent, mild: Secondary | ICD-10-CM | POA: Diagnosis not present

## 2022-05-27 MED ORDER — HYDROXYZINE PAMOATE 50 MG PO CAPS: ORAL_CAPSULE | ORAL | 0 refills | Status: DC

## 2022-05-27 MED ORDER — SERTRALINE HCL 100 MG PO TABS: ORAL_TABLET | ORAL | 0 refills | Status: DC

## 2022-05-27 MED ORDER — LAMOTRIGINE 200 MG PO TABS: 200.0000 mg | ORAL_TABLET | Freq: Every day | ORAL | 0 refills | Status: DC

## 2022-05-27 MED ORDER — TRAZODONE HCL 100 MG PO TABS: 100.0000 mg | ORAL_TABLET | Freq: Every evening | ORAL | 0 refills | Status: DC | PRN

## 2022-05-27 NOTE — Progress Notes (Signed)
Virtual Visit via Telephone Note  I connected with Matthew Hutchinson on 05/27/22 at  3:40 PM EST by telephone and verified that I am speaking with the correct person using two identifiers.  Location: Patient: Home Provider: Home Office   I discussed the limitations, risks, security and privacy concerns of performing an evaluation and management service by telephone and the availability of in person appointments. I also discussed with the patient that there may be a patient responsible charge related to this service. The patient expressed understanding and agreed to proceed.   History of Present Illness: Is evaluated by phone session.  He is taking all his medication as prescribed.  Recently had a blood sugar and his hemoglobin A1c is slightly increased 7.5.  He noticed much better in his mood since Lamictal increased to 200 mg 6 months ago.  He does not get upset or agitated.  He also saw Christina for therapy.  He was able to ventilate his frustration.  He is still anxious and nervous about the future and while politics but he understands that he has no control to change.  He is using CPAP.  Lately he is having hiccups which is a chronic condition and despite taking multiple medication has not have relief.  He is working closely with Dr. Chilton Si for further options.  He denies any suicidal thoughts or homicidal thoughts.  He like to keep his current medication.  He has no rash, itching tremors or shakes.  He is on gabapentin prescribed by other provider.  He is now considering Trulicity to take so he can help to lower his blood sugar.  He is hoping that may give better numbers.  Denies any panic attack.  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   Past Psychiatric History:  H/O inpatient at [facility] for overdose on Ambien.  H/O overdose on Ambien 2 other times but did not require inpatient treatment.  H/O of mood swing, impulsive behavior, speeding ticket, anger, road rage.  No history of psychosis or  hallucination.  Recent Results (from the past 2160 hour(s))  POCT glycosylated hemoglobin (Hb A1C)     Status: Abnormal   Collection Time: 03/31/22 12:32 PM  Result Value Ref Range   Hemoglobin A1C 7.5 (A) 4.0 - 5.6 %   HbA1c POC (<> result, manual entry)     HbA1c, POC (prediabetic range)     HbA1c, POC (controlled diabetic range)    HM DIABETES EYE EXAM     Status: None   Collection Time: 05/13/22 12:00 AM  Result Value Ref Range   HM Diabetic Eye Exam No Retinopathy No Retinopathy      Psychiatric Specialty Exam: Physical Exam  Review of Systems  Weight (!) 356 lb (161.5 kg).There is no height or weight on file to calculate BMI.  General Appearance: NA  Eye Contact:  NA  Speech:  Clear and Coherent  Volume:  Normal  Mood:  Euthymic  Affect:  NA  Thought Process:  Goal Directed  Orientation:  Full (Time, Place, and Person)  Thought Content:  Logical  Suicidal Thoughts:  No  Homicidal Thoughts:  No  Memory:  Immediate;   Good Recent;   Good Remote;   Good  Judgement:  Good  Insight:  Present  Psychomotor Activity:  NA  Concentration:  Concentration: Good and Attention Span: Good  Recall:  Good  Fund of Knowledge:  Good  Language:  Good  Akathisia:  No  Handed:  Right  AIMS (if indicated):  Assets:  Communication Skills Desire for Improvement Housing Social Support Transportation  ADL's:  Intact  Cognition:  WNL  Sleep:   ok with CPAP      Assessment and Plan: Major depressive disorder, recurrent.  Anxiety.  I reviewed blood work results.  Hemoglobin A1c 7.5.  Overall he feels things are going very well.  Continue Lamictal 200 mg daily, Zoloft 100 mg daily, trazodone 100 mg at bedtime and trazodone 50 mg 2 times a day.  I encouraged to keep appointment with [facility] for therapy.  Recommended to call us back if he has any question, concern or if he feels worsening of the symptoms.  Follow-up in 3 months.  Follow Up Instructions:    I discussed the  assessment and treatment plan with the patient. The patient was provided an opportunity to ask questions and all were answered. The patient agreed with the plan and demonstrated an understanding of the instructions.   The patient was advised to call back or seek an in-person evaluation if the symptoms worsen or if the condition fails to improve as anticipated.  Collaboration of Care: Other provider involved in patient's care AEB notes are available in epic to review.  Patient/Guardian was advised Release of Information must be obtained prior to any record release in order to collaborate their care with an outside provider. Patient/Guardian was advised if they have not already done so to contact the registration department to sign all necessary forms in order for [place] to release information regarding their care.   Consent: Patient/Guardian gives verbal consent for treatment and assignment of benefits for services provided during this visit. Patient/Guardian expressed understanding and agreed to proceed.    I provided 20 minutes of non-face-to-face time during this encounter.   Cleotis Nipper, MD

## 2022-05-31 ENCOUNTER — Other Ambulatory Visit: Payer: Self-pay | Admitting: Lab

## 2022-05-31 ENCOUNTER — Other Ambulatory Visit: Payer: Self-pay | Admitting: Family Medicine

## 2022-05-31 DIAGNOSIS — M79672 Pain in left foot: Secondary | ICD-10-CM | POA: Diagnosis not present

## 2022-05-31 DIAGNOSIS — M25572 Pain in left ankle and joints of left foot: Secondary | ICD-10-CM | POA: Diagnosis not present

## 2022-05-31 MED ORDER — INSULIN GLARGINE-YFGN 100 UNIT/ML ~~LOC~~ SOPN: PEN_INJECTOR | SUBCUTANEOUS | 3 refills | Status: DC

## 2022-06-02 ENCOUNTER — Ambulatory Visit (INDEPENDENT_AMBULATORY_CARE_PROVIDER_SITE_OTHER): Payer: BC Managed Care – PPO | Admitting: Licensed Clinical Social Worker

## 2022-06-02 DIAGNOSIS — F330 Major depressive disorder, recurrent, mild: Secondary | ICD-10-CM

## 2022-06-02 DIAGNOSIS — F411 Generalized anxiety disorder: Secondary | ICD-10-CM

## 2022-06-02 NOTE — Progress Notes (Signed)
Virtual Visit via Video Note  I connected with Makayla Confer Benton-Elliot on 06/02/22 at  2:00 PM EST by a video enabled telemedicine application and verified that I am speaking with the correct person using two identifiers.  Location: Patient: home Provider: remote office [place], [place])   I discussed the limitations of evaluation and management by telemedicine and the availability of in person appointments. The patient expressed understanding and agreed to proceed.   I discussed the assessment and treatment plan with the patient. The patient was provided an opportunity to ask questions and all were answered. The patient agreed with the plan and demonstrated an understanding of the instructions.   The patient was advised to call back or seek an in-person evaluation if the symptoms worsen or if the condition fails to improve as anticipated.  I provided 50 minutes of non-face-to-face time during this encounter.   Konner Saiz R Lanis Storlie, LCSW  THERAPIST PROGRESS NOTE  Session Time: 210-3p  Participation Level: Active  Behavioral Response: Neat and Well GroomedAlertDepressed  Type of Therapy: Individual Therapy  Treatment Goals addressed: Problem: Depression Goal:  Decrease depressive symptoms and improve levels of effective functioning-pt reports a decrease in overall depression symptoms 3 out of 5 sessions documented.  Outcome: Progressing Goal: Develop healthy thinking patterns and beliefs about self, others, and the world that lead to the alleviation and help prevent the relapse of depression per self report 3 out of 5 sessions documented.   Outcome: Progressing Intervention: REVIEW PLEASE SKILLS (TREAT PHYSICAL ILLNESS, BALANCE EATING, AVOID MOOD-ALTERING SUBSTANCES, BALANCE SLEEP AND GET EXERCISE) WITH Greggory Stallion Note: reviewed Intervention: Encourage verbalization of feelings/concerns/expectations Note: Allowed/explored  Intervention: Provide grief and bereavement support Note:  Explored/provided   Problem: Anxiety Goal:  Reduce overall frequency, intensity, and duration of the anxiety so that daily functioning is not impaired per pt self report 3 out of 5 sessions documented.   Outcome: Progressing Goal: Learn and implement coping skills that result in a reduction of anxiety and worry, and improve daily functioning per pt report 3 out of 5 sessions documented  Outcome: Progressing Intervention: Encourage patient to identify triggers Note: Allowed pt to identify and reviewed coping skills pt using to manage  ProgressTowards Goals: Progressing  Interventions: CBT, Strength-based, and Supportive  Summary: KELTIN BAIRD is a 61 y.o. male who presents with improving symptoms related to depression diagnosis. Pt reports that he got medication changes at his last psychiatric appointment and he feels that things have steadily improved since then.  Allowed pt to explore and express thoughts and feelings associated with recent life situations and external stressors. Patient reports ongoing stress associated with a recent foot injury. Patient reports that he broke his foot in three places and got a secondary Cellulitis infection because he did not go to the doctor right after the break happened. Patient reports that the pain is better with the foot, but the infection is painful to the touch. Patient reports that he is trying to prioritize his health, especially since he has diabetes and is very aware of the higher risk.  Patient reports that he will be visiting family for Thanksgiving, and plans on enjoying himself as best he can. Patient reports that there is one brother that he does not get along with that will be present at this gathering. Reviewed importance of setting limits and setting boundaries.  Patient reports that he continues to stay up with the news, and is worried about the speaker of the house, and how he does  not have patients best interests at heart.  Encouraged patient to set personal boundaries as far as watching politics on TV.  Patient reports that he is not getting out or leaving the house much, but he is OK with that. Patient feels that overall his mood is improving, and that he's managing situational stressors as well. Patient very cooperative and engaged throughout session, and has good insight and good judgment.  Continued recommendations are as follows: self care behaviors, positive social engagements, focusing on overall work/home/life balance, and focusing on positive physical and emotional wellness.   Suicidal/Homicidal: No  Therapist Response: Pt is continuing to apply interventions learned in session into daily life situations. Pt is currently on track to meet goals utilizing interventions mentioned above. Personal growth and progress noted. Treatment to continue as indicated.   Plan: Return again in 4 weeks.  Diagnosis:  Encounter Diagnoses  Name Primary?   Major depressive disorder, recurrent episode, mild (HCC) Yes   GAD (generalized anxiety disorder)    Collaboration of Care: Other pt encouraged to continue care with psychiatrist of record, Dr. Berniece Andreas  Patient/Guardian was advised Release of Information must be obtained prior to any record release in order to collaborate their care with an outside provider. Patient/Guardian was advised if they have not already done so to contact the registration department to sign all necessary forms in order for [place] to release information regarding their care.   Consent: Patient/Guardian gives verbal consent for treatment and assignment of benefits for services provided during this visit. Patient/Guardian expressed understanding and agreed to proceed.   [place], LCSW 06/02/2022

## 2022-06-05 ENCOUNTER — Other Ambulatory Visit: Payer: Self-pay | Admitting: Family Medicine

## 2022-06-05 DIAGNOSIS — G8929 Other chronic pain: Secondary | ICD-10-CM

## 2022-06-05 DIAGNOSIS — E1165 Type 2 diabetes mellitus with hyperglycemia: Secondary | ICD-10-CM

## 2022-06-07 ENCOUNTER — Other Ambulatory Visit: Payer: Self-pay

## 2022-06-07 DIAGNOSIS — E1165 Type 2 diabetes mellitus with hyperglycemia: Secondary | ICD-10-CM

## 2022-06-07 MED ORDER — TRULICITY 3 MG/0.5ML ~~LOC~~ SOAJ: SUBCUTANEOUS | 1 refills | Status: DC

## 2022-06-07 NOTE — Telephone Encounter (Signed)
Gabapentin 300 mg LOV: 05/06/22 Last Refill:03/16/22 Upcoming appt: 06/30/22

## 2022-06-07 NOTE — Telephone Encounter (Signed)
On gabapentin for chronic pain of left lower extremity, discussed at his 06/24/2021 visit.  Refilled.

## 2022-06-15 DIAGNOSIS — S92515A Nondisplaced fracture of proximal phalanx of left lesser toe(s), initial encounter for closed fracture: Secondary | ICD-10-CM | POA: Diagnosis not present

## 2022-06-15 DIAGNOSIS — M79672 Pain in left foot: Secondary | ICD-10-CM | POA: Diagnosis not present

## 2022-06-23 ENCOUNTER — Encounter: Payer: Self-pay | Admitting: Family Medicine

## 2022-06-23 MED ORDER — DEXCOM G5 MOB/G4 PLAT SENSOR MISC: 3 refills | Status: DC

## 2022-06-25 ENCOUNTER — Encounter: Payer: Self-pay | Admitting: Family Medicine

## 2022-06-25 MED ORDER — FREESTYLE LIBRE 3 SENSOR MISC: 3 refills | Status: DC

## 2022-06-28 NOTE — Addendum Note (Signed)
Addended by: Eldred Manges on: 06/28/2022 10:33 AM   Modules accepted: Orders

## 2022-06-30 ENCOUNTER — Ambulatory Visit: Payer: BC Managed Care – PPO | Admitting: Family Medicine

## 2022-06-30 ENCOUNTER — Encounter: Payer: Self-pay | Admitting: Family Medicine

## 2022-06-30 VITALS — BP 130/80 | HR 94 | Temp 98.0°F | Resp 17 | Ht 74.0 in | Wt 359.2 lb

## 2022-06-30 DIAGNOSIS — E1165 Type 2 diabetes mellitus with hyperglycemia: Secondary | ICD-10-CM | POA: Diagnosis not present

## 2022-06-30 DIAGNOSIS — E785 Hyperlipidemia, unspecified: Secondary | ICD-10-CM | POA: Diagnosis not present

## 2022-06-30 DIAGNOSIS — R066 Hiccough: Secondary | ICD-10-CM

## 2022-06-30 DIAGNOSIS — Z794 Long term (current) use of insulin: Secondary | ICD-10-CM

## 2022-06-30 LAB — HEMOGLOBIN A1C: Hgb A1c MFr Bld: 7.1 % — ABNORMAL HIGH (ref 4.6–6.5)

## 2022-06-30 LAB — MICROALBUMIN / CREATININE URINE RATIO
Creatinine,U: 126.3 mg/dL
Microalb Creat Ratio: 2.9 mg/g (ref 0.0–30.0)
Microalb, Ur: 3.7 mg/dL — ABNORMAL HIGH (ref 0.0–1.9)

## 2022-06-30 MED ORDER — TRULICITY 4.5 MG/0.5ML ~~LOC~~ SOAJ: 4.5000 mg | SUBCUTANEOUS | 2 refills | Status: DC

## 2022-06-30 MED ORDER — FREESTYLE LIBRE 3 SENSOR MISC: 0 refills | Status: DC

## 2022-06-30 NOTE — Progress Notes (Signed)
Subjective:  Patient ID: Matthew Hutchinson, male    DOB: 09-17-1960  Age: 61 y.o. MRN: [id number]  CC:  Chief Complaint  Patient presents with   Medication Refill    Pt still has not received his glucose meter  States [place] has been helping with that    Diabetes    Recheck on diabetes doing well  Blood sugars have been up a little on Monday the blood sugars was in the 300     HPI [name] presents for   Diabetes: Insulin dependent with history of previous hypoglycemia, obesity.  Metformin 500 mg twice daily, Trulicity 3 mg/week and Semglee 60-61units/day He is on Zocor for statin, Norvasc, Hyzaar for hypertension, on ARB.  Has been taking 4.5mg  trulicity (spouse's rx) past 3-4 weeks.  On CGM now, overall happy with readings recently.  As above some elevated home blood sugar readings, 300 a few days ago. Usually in 100's.  Stress earlier in the week - noted higher reading after. 200's with coffee at times, creamer.  Fasting 110-140. Under 100 before dinner - 70-80's at times, usually around 100-110.  No sx lows. Once with late meal in past.  Initial meal 10a-12p, dinner at 6-8pm. Snack at midnight. Bedtime 3 am. Nausea if trying to eat midafternoon. Highest usually 240.  Microalbumin: Normal ratio 04/02/2021 Optho, foot exam, pneumovax: up to date.   Still with chronic hiccups, no relief after ENT, some recommendations from GI and pulmonary. Lithium as option, but has discussed with psychiatrist and decided against for now. Specialist at [facility] and study not feasible.    Lab Results  Component Value Date   HGBA1C 7.5 (A) 03/31/2022   HGBA1C 7.1 (H) 12/24/2021   HGBA1C 6.2 (A) 09/23/2021   Lab Results  Component Value Date   MICROALBUR 1.7 04/02/2021   LDLCALC 67 12/24/2021   CREATININE 0.98 12/24/2021    History Patient Active Problem List   Diagnosis Date Noted   Hiccups 04/01/2022   Newly diagnosed diabetes (HCC) 07/27/2018   Morbid  obesity (HCC) 02/02/2017   Chronic pain due to trauma 06/25/2013   Depression, recurrent (HCC) 11/09/2012   CAD (coronary artery disease) 02/21/2012   Lipid disorder 02/21/2012   Angina pectoris 09/27/2011   Obstructive sleep apnea 05/20/2010   Essential hypertension 04/17/2010   COPD mixed type (HCC) 04/17/2010   WEIGHT GAIN, ABNORMAL 04/17/2010   Past Medical History:  Diagnosis Date   Allergy    Anxiety    Arthritis    COPD (chronic obstructive pulmonary disease) (HCC)    Depression    Emphysema of lung (HCC)    Hyperlipidemia    Morbid obesity (HCC)    OSA (obstructive sleep apnea)    uses CPAP nightly   PONV (postoperative nausea and vomiting)    Unspecified essential hypertension    Past Surgical History:  Procedure Laterality Date   ESOPHAGOGASTRODUODENOSCOPY (EGD) WITH PROPOFOL N/A 10/16/2021   Procedure: ESOPHAGOGASTRODUODENOSCOPY (EGD) WITH PROPOFOL;  Surgeon: Jeani Hawking, MD;  Location: [facility];  Service: Endoscopy;  Laterality: N/A;   INSERTION OF MESH N/A 09/18/2015   Procedure: INSERTION OF MESH;  Surgeon: Abigail Miyamoto, MD;  Location: [facility];  Service: General;  Laterality: N/A;   LEG SURGERY Left    SAVORY DILATION N/A 10/16/2021   Procedure: SAVORY DILATION;  Surgeon: Jeani Hawking, MD;  Location: [facility];  Service: Endoscopy;  Laterality: N/A;   SHOULDER ARTHROSCOPY Left    TONSILLECTOMY  UMBILICAL HERNIA REPAIR N/A 09/18/2015   Procedure: HERNIA REPAIR UMBILICAL ADULT WITH MESH ;  Surgeon: Abigail Miyamoto, MD;  Location: [facility];  Service: General;  Laterality: N/A;   Allergies  Allergen Reactions   Penicillins Anaphylaxis   Valsartan Itching and Swelling   Prior to Admission medications   Medication Sig Start Date End Date Taking? Authorizing Provider  amLODipine (NORVASC) 5 MG tablet TAKE 1 TABLET (5 MG TOTAL) BY MOUTH DAILY. 02/25/22   Shade Flood, MD  amLODipine (NORVASC) 5 MG tablet Take 1  tablet by mouth daily.    [provider]  aspirin 81 MG tablet Take 1 tablet (81 mg total) by mouth daily. 11/13/12   [name], NP  b complex vitamins tablet Take 1 tablet by mouth daily.    [provider]  calcium gluconate 500 MG tablet Take 500 mg by mouth daily.    [provider]  Carboxymethylcellul-Glycerin (LUBRICATING EYE DROPS OP) Place 1 drop into both eyes daily as needed (dry eyes). Patient not taking: Reported on 05/06/2022    [provider]  Cholecalciferol (VITAMIN D) 2000 UNITS tablet Take 1 tablet (2,000 Units total) by mouth daily. 11/13/12   [name], NP  Cholecalciferol (VITAMIN D3) 1.25 MG (50000 UT) CAPS INSTILL 1 DROP BY OPHTHALMIC ROUTE 3 TIMES EVERY DAY INTO RIGHT EYE    [provider]  Continuous Blood Gluc Sensor (FREESTYLE LIBRE 3 SENSOR) MISC Apply as instructed, change every 10 days. 06/30/22   Shade Flood, MD  Dulaglutide (TRULICITY) 3 MG/0.5ML SOPN INJECT 3 MG SUBCUTANEOUSLY ONCE A WEEK AS DIRECTED 06/07/22   Shade Flood, MD  Dulaglutide (TRULICITY) 3 MG/0.5ML SOPN INJECT 3 MG SUBCUTANEOUSLY ONCE A WEEK AS DIRECTED 06/07/22   Shade Flood, MD  gabapentin (NEURONTIN) 300 MG capsule Take 2 capsules by mouth 2 (two) times daily.    [provider]  gabapentin (NEURONTIN) 300 MG capsule TAKE 2 CAPSULES BY MOUTH TWICE A DAY 06/07/22   Shade Flood, MD  GARLIC PO Take 353 mg by mouth daily.    [provider]  hydrOXYzine (VISTARIL) 50 MG capsule TAKE 2 CAPSULES BY MOUTH AT BEDTIME 05/27/22   Arfeen, Phillips Grout, MD  insulin glargine (LANTUS SOLOSTAR) 100 UNIT/ML Solostar Pen INJECT 50 UNITS INTO THE SKIN DAILY    [provider]  insulin glargine-yfgn (SEMGLEE, YFGN,) 100 UNIT/ML Pen INJECT 62 UNITS INTO THE SKIN DAILY. 05/31/22   Shade Flood, MD  Insulin Pen Needle (TECHLITE PEN NEEDLES) 32G X 4 MM MISC Use once daily with lantus. Dx E11.9, Z79.4 06/11/21   Shade Flood, MD  lamoTRIgine (LAMICTAL) 200 MG tablet Take 1 tablet (200 mg total) by mouth daily. 05/27/22   Arfeen, Phillips Grout, MD  losartan-hydrochlorothiazide (HYZAAR) 100-25 MG tablet TAKE 1 TABLET BY MOUTH EVERY DAY 01/13/22   Shade Flood, MD  losartan-hydrochlorothiazide (HYZAAR) 100-25 MG tablet Take 1 tablet by mouth daily.    [provider]  Lutein 40 MG CAPS Take 40 mg by mouth at bedtime.    [provider]  magnesium gluconate (MAGONATE) 500 MG tablet Take 500 mg by mouth daily.    [provider]  metFORMIN (GLUCOPHAGE) 500 MG tablet Take 1 tablet by mouth 2 (two) times daily with a meal.    [provider]  metFORMIN (GLUCOPHAGE) 500 MG tablet TAKE 1 TABLET BY MOUTH TWICE A DAY WITH MEALS 05/25/22   Neva Seat,  Asencion Partridge, MD  methocarbamol (ROBAXIN) 750 MG tablet TAKE 1 TABLET BY MOUTH 2-3 TIMES DAILY AS NEEDED FOR MUSCLE SPASMS. Patient not taking: Reported on 10/12/2021 07/07/21   Sammuel Cooper, PA-C  Multiple Vitamin (MULTIVITAMIN) capsule Take 1 capsule by mouth daily. 11/13/12   [name], NP  nitroGLYCERIN (NITROSTAT) 0.4 MG SL tablet Place 1 tablet (0.4 mg total) under the tongue every 5 (five) minutes as needed for chest pain. If chest pain not resolved, after 3 doses 5 minutes apart--call 911 Patient taking differently: Place 0.4 mg under the tongue See admin instructions. Take 0.4 mg as needed for hiccups 11/13/12   [name], NP  Potassium 99 MG TABS Take 99 mg by mouth at bedtime.    [provider]  sertraline (ZOLOFT) 100 MG tablet TAKE 1 TABLET BY MOUTH DAILY 05/27/22   Arfeen, Phillips Grout, MD  Shark Cartilage 740 MG CAPS Take 1 capsule by mouth 3 (three) times daily.    [provider]  simvastatin (ZOCOR) 20 MG tablet Take 1 tablet by mouth at bedtime.    [provider]  simvastatin (ZOCOR) 20 MG tablet TAKE 1 TABLET BY MOUTH EVERYDAY AT BEDTIME 05/25/22   Shade Flood, MD  traMADol (ULTRAM) 50 MG  tablet Take 50 mg by mouth daily as needed for severe pain.    [provider]  traZODone (DESYREL) 100 MG tablet Take 1 tablet (100 mg total) by mouth at bedtime as needed for sleep. 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 Education: college. Exercise: No.   Social Determinants of Health   Financial Resource Strain: Not on file  Food Insecurity: Not on file  Transportation Needs: Not on file  Physical Activity: Not on file  Stress: Not on file  Social Connections: Not on file  Intimate Partner Violence: Not on file    Review of Systems  Constitutional:  Negative for fatigue and unexpected weight change.  Eyes:  Negative for visual disturbance.  Respiratory:  Negative for cough, chest tightness and shortness of breath.   Cardiovascular:  Negative for chest pain, palpitations and leg swelling.  Gastrointestinal:  Negative for abdominal pain and blood in stool.  Neurological:  Negative for dizziness, light-headedness and headaches.  hiccups, other per HPI as above.   Objective:   Vitals:   06/30/22 1335  BP: 130/80  Pulse: 94  Resp: 17  Temp: 98 F (36.7 C)  TempSrc:  Temporal  SpO2: 98%  Weight: (!) 359 lb 3 oz (162.9 kg)  Height: 6\' 2"  (1.88 m)     Physical Exam Vitals reviewed.  Constitutional:      Appearance: He is well-developed.  HENT:     Head: Normocephalic and atraumatic.  Neck:     Vascular: No carotid bruit or JVD.  Cardiovascular:     Rate and Rhythm: Normal rate and regular rhythm.     Heart sounds: Normal heart sounds. No murmur heard. Pulmonary:     Effort: Pulmonary effort is normal.     Breath sounds: Normal breath sounds. No rales.  Musculoskeletal:     Right lower leg: No edema.     Left lower leg: No edema.  Skin:    General: Skin is warm and dry.  Neurological:     Mental Status: He is alert and oriented to person, place, and time.  Psychiatric:        Mood and Affect: Mood normal.     Assessment & Plan:  [name] is a 61 y.o. male . Type 2 diabetes mellitus with hyperglycemia, with long-term current use of insulin (HCC) - Plan: Microalbumin / creatinine urine ratio, Comprehensive metabolic panel, Hemoglobin A1c  -Variable readings as above, check A1c.  Midday snack discussed to minimize risk of hypoglycemia but difficult with nausea at times with midday snack.  Depending on A1c and home readings, would consider endocrinology eval to decide on medication changes.  RTC precautions.  Check labs as above, no med changes for now.  Continue higher dose of Trulicity, new prescription sent.  Hyperlipidemia, unspecified hyperlipidemia type - Plan: Comprehensive metabolic panel, Lipid panel Tolerating statin, continue same, check updated labs.  Chronic hiccups Unfortunately has had some persistent difficulty without any improvement with various treatment attempts by gastroenterology and status post ENT and GI eval.  [facility], but recommended he discuss other evaluation with possible tertiary care center locally to determine if other treatment options available.  As discussed  above with his specialists, lithium not thought to be option at this time.  Meds ordered this encounter  Medications   Continuous Blood Gluc Sensor (FREESTYLE LIBRE 3 SENSOR) MISC    Sig: Apply as instructed, change every 10 days.    Dispense:  16 each    Refill:  0   Patient Instructions  Midafternoon snack may be helpful to minimize risk of low blood sugars, but understand if that causes nausea that may be difficult.  If A1c is elevated, we could change meds further or option to meet with endocrinology.  I would recommend discussing other specialist or tertiary care center eval with GI or pulmonary for hiccups.      Signed,   Meredith StaggersJeffrey Baylee Campus, MD [facility], [facility] 06/30/22 2:25 PM

## 2022-06-30 NOTE — Patient Instructions (Addendum)
Mid-afternoon snack may be helpful to minimize risk of low blood sugars, but understand if that causes nausea that may be difficult.  Make sure not to skip meals.  If any low blood sugars let me know. If A1c is elevated, we could change meds further or option to meet with endocrinology, or can continue to monitor as you have only been on the higher dose of Trulicity the past month.  I will send in that new dose. I would recommend discussing other specialist or tertiary care center eval with GI or pulmonary for hiccups.   Please let me know if I can help.  Hang in there and thanks for coming in today.

## 2022-07-01 LAB — COMPREHENSIVE METABOLIC PANEL
ALT: 22 U/L (ref 0–53)
AST: 22 U/L (ref 0–37)
Albumin: 4.6 g/dL (ref 3.5–5.2)
Alkaline Phosphatase: 74 U/L (ref 39–117)
BUN: 12 mg/dL (ref 6–23)
CO2: 28 mEq/L (ref 19–32)
Calcium: 9.6 mg/dL (ref 8.4–10.5)
Chloride: 102 mEq/L (ref 96–112)
Creatinine, Ser: 0.98 mg/dL (ref 0.40–1.50)
GFR: 83.3 mL/min (ref >60.00)
Glucose, Bld: 151 mg/dL — ABNORMAL HIGH (ref 70–99)
Potassium: 4.4 mEq/L (ref 3.5–5.1)
Sodium: 137 mEq/L (ref 135–145)
Total Bilirubin: 0.7 mg/dL (ref 0.2–1.2)
Total Protein: 7.4 g/dL (ref 6.0–8.3)

## 2022-07-01 LAB — LIPID PANEL
Cholesterol: 135 mg/dL (ref 0–200)
HDL: 44.7 mg/dL (ref >39.00)
LDL Cholesterol: 72 mg/dL (ref 0–99)
NonHDL: 89.9
Total CHOL/HDL Ratio: 3
Triglycerides: 88 mg/dL (ref 0.0–149.0)
VLDL: 17.6 mg/dL (ref 0.0–40.0)

## 2022-07-02 ENCOUNTER — Other Ambulatory Visit: Payer: Self-pay | Admitting: Lab

## 2022-07-02 DIAGNOSIS — I10 Essential (primary) hypertension: Secondary | ICD-10-CM

## 2022-07-02 DIAGNOSIS — G8929 Other chronic pain: Secondary | ICD-10-CM

## 2022-07-02 DIAGNOSIS — E119 Type 2 diabetes mellitus without complications: Secondary | ICD-10-CM

## 2022-07-02 MED ORDER — METFORMIN HCL 500 MG PO TABS: 500.0000 mg | ORAL_TABLET | Freq: Two times a day (BID) | ORAL | 1 refills | Status: DC

## 2022-07-02 MED ORDER — GABAPENTIN 300 MG PO CAPS: 600.0000 mg | ORAL_CAPSULE | Freq: Two times a day (BID) | ORAL | 0 refills | Status: DC

## 2022-07-02 MED ORDER — LOSARTAN POTASSIUM-HCTZ 100-25 MG PO TABS: 1.0000 | ORAL_TABLET | Freq: Every day | ORAL | 1 refills | Status: DC

## 2022-07-02 MED ORDER — AMLODIPINE BESYLATE 5 MG PO TABS: 5.0000 mg | ORAL_TABLET | Freq: Every day | ORAL | 1 refills | Status: DC

## 2022-07-15 DIAGNOSIS — S92515A Nondisplaced fracture of proximal phalanx of left lesser toe(s), initial encounter for closed fracture: Secondary | ICD-10-CM | POA: Diagnosis not present

## 2022-08-04 ENCOUNTER — Ambulatory Visit (INDEPENDENT_AMBULATORY_CARE_PROVIDER_SITE_OTHER): Payer: BC Managed Care – PPO | Admitting: Licensed Clinical Social Worker

## 2022-08-04 DIAGNOSIS — F330 Major depressive disorder, recurrent, mild: Secondary | ICD-10-CM | POA: Diagnosis not present

## 2022-08-04 DIAGNOSIS — F411 Generalized anxiety disorder: Secondary | ICD-10-CM

## 2022-08-04 NOTE — Progress Notes (Signed)
Virtual Visit via Video Note  I connected with Matthew Hutchinson on 08/04/22 at  2:00 PM EST by a video enabled telemedicine application and verified that I am speaking with the correct person using two identifiers.  Location: Patient: home Provider: remote office [place], [place])   I discussed the limitations of evaluation and management by telemedicine and the availability of in person appointments. The patient expressed understanding and agreed to proceed.   I discussed the assessment and treatment plan with the patient. The patient was provided an opportunity to ask questions and all were answered. The patient agreed with the plan and demonstrated an understanding of the instructions.   The patient was advised to call back or seek an in-person evaluation if the symptoms worsen or if the condition fails to improve as anticipated.  I provided 50 minutes of non-face-to-face time during this encounter.   Gionna Polak R Kieran Arreguin, LCSW  THERAPIST PROGRESS NOTE  Session Time: 210-3p  Participation Level: Active  Behavioral Response: Neat and Well GroomedAlertDepressed  Type of Therapy: Individual Therapy  Treatment Goals addressed: Learn and implement coping skills that result in a reduction of anxiety and worry, and improve daily functioning per pt report 3 out of 5 sessions documented   Develop healthy thinking patterns and beliefs about self, others, and the world that lead to the alleviation and help prevent the relapse of depression per self report 3 out of 5 sessions documented   ProgressTowards Goals: Progressing  Interventions: CBT, Strength-based, and Supportive  Summary: Matthew Hutchinson is a 62 y.o. male who presents with improving symptoms related to depression diagnosis. Pt reports overall mood stable and that he is managing situational stressors and anxiety/panic well. Pt reports one slight panicky moment (not a panic attacks) since last session.  .  Allowed pt to explore and express thoughts and feelings associated with recent life situations and external stressors. Managing anxiety well. Discussed situation where pt broke foot in a few spots--possible nerve damage. Pt reports that he didn't go to the doctor for one week.  Pt is also continuing to experience long term hiccups.   Discussed holidays--pt elected to not  visit family for christmas. Spent time with friends and went to a restaurant for the first time since Covid. Discussed need for setting limits/boundaries with family members to protect feelings of safety and peace.   Pt reports stress due to partner having dx of AFIB--doing better. Discussed negative experience being in the ED with "a bunch of "Karen's"".  Pt feels that other people in the ED were being judgmental of him and/or partner.  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 Pt states that he doesn't know what direction the [facility] [employer] store will be in--new owner could evict them or go up on the rent. Pt admits this is triggering some stress.   Reviewed pt focusing on things that he can control and pt is very aware that if things go in a different direction, he will have a plan in place.   Allowed pt to identify anxiety/stress triggers, and discuss coping skills that assist pt in overall management of mood, anxiety, panic, and emotion regulation.   Continued recommendations are as follows: self care behaviors, positive social engagements, focusing on overall work/home/life balance, and focusing on positive physical and emotional wellness.   Suicidal/Homicidal: No  Therapist Response: Pt is continuing to apply interventions learned in session into daily life situations. Pt is  currently on track to meet goals utilizing interventions mentioned above. Personal growth and progress noted. Treatment to continue as indicated.   Plan: Return again in 4  weeks.  Diagnosis:  Encounter Diagnoses  Name Primary?   Major depressive disorder, recurrent episode, mild (HCC) Yes   GAD (generalized anxiety disorder)     Collaboration of Care: Other pt encouraged to continue care with psychiatrist of record, Dr. Berniece Andreas  Patient/Guardian was advised Release of Information must be obtained prior to any record release in order to collaborate their care with an outside provider. Patient/Guardian was advised if they have not already done so to contact the registration department to sign all necessary forms in order for [place] to release information regarding their care.   Consent: Patient/Guardian gives verbal consent for treatment and assignment of benefits for services provided during this visit. Patient/Guardian expressed understanding and agreed to proceed.   [place], LCSW 08/04/2022

## 2022-08-07 DIAGNOSIS — G4733 Obstructive sleep apnea (adult) (pediatric): Secondary | ICD-10-CM | POA: Diagnosis not present

## 2022-08-24 ENCOUNTER — Encounter (HOSPITAL_COMMUNITY): Payer: Self-pay | Admitting: Psychiatry

## 2022-08-24 ENCOUNTER — Telehealth (HOSPITAL_BASED_OUTPATIENT_CLINIC_OR_DEPARTMENT_OTHER): Payer: BC Managed Care – PPO | Admitting: Psychiatry

## 2022-08-24 DIAGNOSIS — F330 Major depressive disorder, recurrent, mild: Secondary | ICD-10-CM | POA: Diagnosis not present

## 2022-08-24 DIAGNOSIS — F411 Generalized anxiety disorder: Secondary | ICD-10-CM

## 2022-08-24 MED ORDER — SERTRALINE HCL 100 MG PO TABS: ORAL_TABLET | ORAL | 0 refills | Status: DC

## 2022-08-24 MED ORDER — LAMOTRIGINE 200 MG PO TABS: 200.0000 mg | ORAL_TABLET | Freq: Every day | ORAL | 0 refills | Status: DC

## 2022-08-24 MED ORDER — HYDROXYZINE PAMOATE 50 MG PO CAPS: ORAL_CAPSULE | ORAL | 0 refills | Status: DC

## 2022-08-24 MED ORDER — TRAZODONE HCL 100 MG PO TABS: 100.0000 mg | ORAL_TABLET | Freq: Every evening | ORAL | 0 refills | Status: DC | PRN

## 2022-08-24 NOTE — Progress Notes (Signed)
Virtual Visit via Telephone Note  I connected with Meric Prout Benton-Elliot on 08/24/22 at  3:00 PM EST by telephone and verified that I am speaking with the correct person using two identifiers.  Location: Patient: Home Provider: Home Office   I discussed the limitations, risks, security and privacy concerns of performing an evaluation and management service by telephone and the availability of in person appointments. I also discussed with the patient that there may be a patient responsible charge related to this service. The patient expressed understanding and agreed to proceed.   History of Present Illness: Patient is evaluated by phone session.  He is taking all his medication as prescribed.  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  He reported medicine is working and he denies any mania, psychosis, hallucination.  He denies any agitation or any irritability.  He uses CPAP that helps his sleep.  He recently saw his PCP and his hemoglobin A1c improved from the past.  Patient told his PCP is now considering to refer to endocrinologist.  Patient denies any tremors, rash, itching or any concern from the medication.  He reported holidays are okay.  He did not visit to the family members because he does not want any new issues since his mother died.  He denies any panic attack or any crying spells.  He like to keep his current medication.   Past Psychiatric History:  H/O inpatient at [facility] for overdose on Ambien.  H/O overdose on Ambien 2 other times but did not require inpatient treatment.  H/O of mood swing, impulsive behavior, speeding ticket, anger, road rage.  No history of psychosis or hallucination.  Recent Results (from the past 2160 hour(s))  Microalbumin / creatinine urine ratio     Status: Abnormal   Collection Time: 06/30/22  2:31 PM  Result Value Ref  Range   Microalb, Ur 3.7 (H) 0.0 - 1.9 mg/dL   Creatinine,U 126.3 mg/dL   Microalb Creat Ratio 2.9 0.0 - 30.0 mg/g  Comprehensive metabolic panel     Status: Abnormal   Collection Time: 06/30/22  2:31 PM  Result Value Ref Range   Sodium 137 135 - 145 mEq/L   Potassium 4.4 3.5 - 5.1 mEq/L   Chloride 102 96 - 112 mEq/L   CO2 28 19 - 32 mEq/L   Glucose, Bld 151 (H) 70 - 99 mg/dL   BUN 12 6 - 23 mg/dL   Creatinine, Ser 0.98 0.40 - 1.50 mg/dL   Total Bilirubin 0.7 0.2 - 1.2 mg/dL   Alkaline Phosphatase 74 39 - 117 U/L   AST 22 0 - 37 U/L   ALT 22 0 - 53 U/L   Total Protein 7.4 6.0 - 8.3 g/dL   Albumin 4.6 3.5 - 5.2 g/dL   GFR 83.30 >60.00 mL/min    Comment: Calculated using the CKD-EPI Creatinine Equation (2021)   Calcium 9.6 8.4 - 10.5 mg/dL  Lipid panel     Status: None   Collection Time: 06/30/22  2:31 PM  Result Value Ref Range   Cholesterol 135 0 - 200 mg/dL    Comment: ATP III Classification       Desirable:  < 200 mg/dL               Borderline High:  200 - 239 mg/dL  High:  > = 240 mg/dL   Triglycerides 88.0 0.0 - 149.0 mg/dL    Comment: Normal:  <150 mg/dLBorderline High:  150 - 199 mg/dL   HDL 44.70 >39.00 mg/dL   VLDL 17.6 0.0 - 40.0 mg/dL   LDL Cholesterol 72 0 - 99 mg/dL   Total CHOL/HDL Ratio 3     Comment:                Men          Women1/2 Average Risk     3.4          3.3Average Risk          5.0          4.42X Average Risk          9.6          7.13X Average Risk          15.0          11.0                       NonHDL 89.90     Comment: NOTE:  Non-HDL goal should be 30 mg/dL higher than patient's LDL goal (i.e. LDL goal of < 70 mg/dL, would have non-HDL goal of < 100 mg/dL)  Hemoglobin A1c     Status: Abnormal   Collection Time: 06/30/22  2:31 PM  Result Value Ref Range   Hgb A1c MFr Bld 7.1 (H) 4.6 - 6.5 %    Comment: Glycemic Control Guidelines for People with Diabetes:Non Diabetic:  <6%Goal of Therapy: <7%Additional Action Suggested:  >8%       Psychiatric Specialty Exam: Physical Exam  Review of Systems  Weight (!) 359 lb (162.8 kg).There is no height or weight on file to calculate BMI.  General Appearance: NA  Eye Contact:  NA  Speech:  Clear and Coherent  Volume:  Normal  Mood:  Euthymic  Affect:  NA  Thought Process:  Goal Directed  Orientation:  Full (Time, Place, and Person)  Thought Content:  Logical  Suicidal Thoughts:  No  Homicidal Thoughts:  No  Memory:  Immediate;   Good Recent;   Good Remote;   Good  Judgement:  Intact  Insight:  Present  Psychomotor Activity:  NA  Concentration:  Concentration: Good and Attention Span: Good  Recall:  Good  Fund of Knowledge:  Good  Language:  Good  Akathisia:  No  Handed:  Right  AIMS (if indicated):     Assets:  Communication Skills Desire for Improvement Housing Social Support Talents/Skills Transportation  ADL's:  Intact  Cognition:  WNL  Sleep:   ok with CPAP      Assessment and Plan: Major depressive disorder, recurrent.  Anxiety.  I reviewed blood work results.  Hemoglobin A1c improved from 7.5-7.1.  He may need an endocrinologist as PCP trying to refer him.  He does not want to change the medications and symptoms are manageable.  He is in therapy with Christina.  Continue Zoloft 100 mg daily, trazodone 1 mg daily, Lamictal 200 mg daily and hydroxyzine 50 mg 2 tablet at bedtime.  Recommended to call us back if is any question or any concern.  Follow-up in 3 months.  Follow Up Instructions:    I discussed the assessment and treatment plan with the patient. The patient was provided an opportunity to ask questions and all were answered. The patient agreed with the plan and demonstrated  an understanding of the instructions.   The patient was advised to call back or seek an in-person evaluation if the symptoms worsen or if the condition fails to improve as anticipated.  Collaboration of Care: Other provider involved in patient's care AEB notes are  available in epic to review.  Patient/Guardian was advised Release of Information must be obtained prior to any record release in order to collaborate their care with an outside provider. Patient/Guardian was advised if they have not already done so to contact the registration department to sign all necessary forms in order for [place] to release information regarding their care.   Consent: Patient/Guardian gives verbal consent for treatment and assignment of benefits for services provided during this visit. Patient/Guardian expressed understanding and agreed to proceed.    I provided 21 minutes of non-face-to-face time during this encounter.   Kathlee Nations, MD

## 2022-08-26 ENCOUNTER — Other Ambulatory Visit: Payer: Self-pay | Admitting: Family Medicine

## 2022-09-14 DIAGNOSIS — S92515D Nondisplaced fracture of proximal phalanx of left lesser toe(s), subsequent encounter for fracture with routine healing: Secondary | ICD-10-CM | POA: Diagnosis not present

## 2022-09-29 ENCOUNTER — Ambulatory Visit (INDEPENDENT_AMBULATORY_CARE_PROVIDER_SITE_OTHER): Payer: BC Managed Care – PPO | Admitting: Family Medicine

## 2022-09-29 DIAGNOSIS — Z794 Long term (current) use of insulin: Secondary | ICD-10-CM

## 2022-09-29 DIAGNOSIS — I10 Essential (primary) hypertension: Secondary | ICD-10-CM | POA: Diagnosis not present

## 2022-09-29 DIAGNOSIS — E1165 Type 2 diabetes mellitus with hyperglycemia: Secondary | ICD-10-CM | POA: Diagnosis not present

## 2022-09-29 DIAGNOSIS — G8929 Other chronic pain: Secondary | ICD-10-CM

## 2022-09-29 DIAGNOSIS — M79605 Pain in left leg: Secondary | ICD-10-CM

## 2022-09-29 MED ORDER — TRULICITY 4.5 MG/0.5ML ~~LOC~~ SOAJ: 4.5000 mg | SUBCUTANEOUS | 2 refills | Status: DC

## 2022-09-29 MED ORDER — METFORMIN HCL 500 MG PO TABS: 500.0000 mg | ORAL_TABLET | Freq: Two times a day (BID) | ORAL | 1 refills | Status: DC

## 2022-09-29 MED ORDER — LOSARTAN POTASSIUM-HCTZ 100-25 MG PO TABS: 1.0000 | ORAL_TABLET | Freq: Every day | ORAL | 1 refills | Status: DC

## 2022-09-29 MED ORDER — INSULIN GLARGINE-YFGN 100 UNIT/ML ~~LOC~~ SOPN: PEN_INJECTOR | SUBCUTANEOUS | 3 refills | Status: DC

## 2022-09-29 MED ORDER — GABAPENTIN 300 MG PO CAPS: 600.0000 mg | ORAL_CAPSULE | Freq: Two times a day (BID) | ORAL | 0 refills | Status: DC

## 2022-09-29 MED ORDER — SIMVASTATIN 20 MG PO TABS: ORAL_TABLET | ORAL | 2 refills | Status: DC

## 2022-09-29 MED ORDER — AMLODIPINE BESYLATE 5 MG PO TABS: 5.0000 mg | ORAL_TABLET | Freq: Every day | ORAL | 1 refills | Status: DC

## 2022-09-29 NOTE — Patient Instructions (Addendum)
Thanks for coming in today. No med changes at this time. Recheck in 3 months.

## 2022-09-29 NOTE — Progress Notes (Unsigned)
Subjective:  Patient ID: Matthew Hutchinson, male    DOB: 1961/01/23  Age: 62 y.o. MRN: [id number]  CC:  Chief Complaint  Patient presents with   Diabetes    HPI ZAVEON NOLTON presents for   Diabetes: Insulin-dependent with history of previous hyperglycemia, obesity.  Treated with metformin 500 mg twice daily, Trulicity 4.5 mg/week (for 3-4 weeks prior to last visit) and Semglee 61 units/day.  Improved glucose readings on higher dose Trulicity at last visit.  A1c near goal at 7.1.  We discussed a mid afternoon snack to minimize risk of hypoglycemia but that has been previously difficult due to nausea. Stable LDL in December, continues on simvastatin 20 mg daily.  He is on ARB with losartan HCTZ. Still on semglee 61 units Home readings doing better.  Currently 108, 70-180 92% of time past week.  fasting: around 110.  Lower around 100 before dinner.  Postprandial: up to 180-220. With nondairy creamer and splenda. Rare high with certain foods only.  Lowest in past 90 days was 60. 30 day low of 85.   No symptomatic lows recently.   Microalbumin: 3.7 on 06/30/2022 Optho, foot exam, pneumovax: Up-to-date  Lab Results  Component Value Date   HGBA1C 7.1 (H) 06/30/2022   HGBA1C 7.5 (A) 03/31/2022   HGBA1C 7.1 (H) 12/24/2021   Lab Results  Component Value Date   MICROALBUR 3.7 (H) 06/30/2022   LDLCALC 72 06/30/2022   CREATININE 0.98 06/30/2022   Hypertension: Norvasc 5mg , losartan hct 100/25mg  qd.  No new side effects.  BP Readings from Last 3 Encounters:  09/29/22 128/70  06/30/22 130/80  05/06/22 122/68   Lab Results  Component Value Date   CREATININE 0.98 06/30/2022     History Patient Active Problem List   Diagnosis Date Noted   Hiccups 04/01/2022   Newly diagnosed diabetes ([place]) 07/27/2018   Morbid obesity ([place]) 02/02/2017   Chronic pain due to trauma 06/25/2013   Depression, recurrent ([place]) 11/09/2012   CAD (coronary artery disease) 02/21/2012    Lipid disorder 02/21/2012   Angina pectoris 09/27/2011   Obstructive sleep apnea 05/20/2010   Essential hypertension 04/17/2010   COPD mixed type ([place]) 04/17/2010   WEIGHT GAIN, ABNORMAL 04/17/2010    Past Medical History:  Diagnosis Date   Allergy    Anxiety    Arthritis    COPD (chronic obstructive pulmonary disease) ([place])    Depression    Emphysema of lung (HCC)    Hyperlipidemia    Morbid obesity (HCC)    OSA (obstructive sleep apnea)    uses CPAP nightly   PONV (postoperative nausea and vomiting)    Unspecified essential hypertension     {History (Optional):23778}  Review of Systems  Constitutional:  Negative for fatigue and unexpected weight change.  Eyes:  Negative for visual disturbance.  Respiratory:  Negative for cough, chest tightness and shortness of breath.   Cardiovascular:  Negative for chest pain, palpitations and leg swelling (no new sx's, compression socks.).  Gastrointestinal:  Negative for abdominal pain and blood in stool.  Neurological:  Negative for dizziness, light-headedness and headaches.     Objective:   Vitals:   09/29/22 1411  BP: 128/70  Pulse: 85  Temp: 97.8 F (36.6 C)  TempSrc: Temporal  SpO2: 98%  Weight: (!) 351 lb 3.2 oz (159.3 kg)  Height: 6\' 2"  (1.88 m)   {Vitals History (Optional):23777}  Physical Exam Vitals reviewed.  Constitutional:      Appearance: He is well-developed.  HENT:     Head: Normocephalic and atraumatic.  Neck:     Vascular: No carotid bruit or JVD.  Cardiovascular:     Rate and Rhythm: Normal rate and regular rhythm.     Heart sounds: Normal heart sounds. No murmur heard. Pulmonary:     Effort: Pulmonary effort is normal.     Breath sounds: Normal breath sounds. No rales.  Musculoskeletal:     Right lower leg: No edema.     Left lower leg: No edema.  Skin:    General: Skin is warm and dry.  Neurological:     Mental Status: He is alert and oriented to person, place, and time.  Psychiatric:         Mood and Affect: Mood normal.     Assessment & Plan:  JEMELLE JANSMA is a 62 y.o. male . Essential hypertension -     amLODIPine Besylate; Take 1 tablet (5 mg total) by mouth daily.  Dispense: 90 tablet; Refill: 1 -     Losartan Potassium-HCTZ; Take 1 tablet by mouth daily.  Dispense: 90 tablet; Refill: 1  Chronic pain of left lower extremity -     Gabapentin; Take 2 capsules (600 mg total) by mouth 2 (two) times daily.  Dispense: 360 capsule; Refill: 0  Type 2 diabetes mellitus with hyperglycemia, with long-term current use of insulin (HCC) -     Comprehensive metabolic panel -     Hemoglobin [id number] -     Trulicity; Inject 4.5 mg as directed once a week.  Dispense: 6 mL; Refill: 2 -     Insulin Glargine-yfgn; INJECT 61 UNITS INTO THE SKIN DAILY  Dispense: 15 mL; Refill: 3 -     metFORMIN HCl; Take 1 tablet (500 mg total) by mouth 2 (two) times daily with a meal.  Dispense: 180 tablet; Refill: 1 -     Simvastatin; TAKE 1 TABLET BY MOUTH EVERYDAY AT BEDTIME  Dispense: 90 tablet; Refill: 2    Patient Instructions  Thanks for coming in today. No med changes at this time. Recheck in 3 months.       Signed,   Merri Ray, MD [place] 09/29/22 2:28 PM

## 2022-09-30 ENCOUNTER — Encounter: Payer: Self-pay | Admitting: Family Medicine

## 2022-09-30 DIAGNOSIS — G8929 Other chronic pain: Secondary | ICD-10-CM | POA: Insufficient documentation

## 2022-09-30 DIAGNOSIS — E1165 Type 2 diabetes mellitus with hyperglycemia: Secondary | ICD-10-CM | POA: Insufficient documentation

## 2022-09-30 LAB — COMPREHENSIVE METABOLIC PANEL
ALT: 24 U/L (ref 0–53)
AST: 25 U/L (ref 0–37)
Albumin: 4.4 g/dL (ref 3.5–5.2)
Alkaline Phosphatase: 72 U/L (ref 39–117)
BUN: 9 mg/dL (ref 6–23)
CO2: 28 mEq/L (ref 19–32)
Calcium: 9.3 mg/dL (ref 8.4–10.5)
Chloride: 103 mEq/L (ref 96–112)
Creatinine, Ser: 0.95 mg/dL (ref 0.40–1.50)
GFR: 86.32 mL/min (ref >60.00)
Glucose, Bld: 116 mg/dL — ABNORMAL HIGH (ref 70–99)
Potassium: 4.1 mEq/L (ref 3.5–5.1)
Sodium: 139 mEq/L (ref 135–145)
Total Bilirubin: 0.7 mg/dL (ref 0.2–1.2)
Total Protein: 7.2 g/dL (ref 6.0–8.3)

## 2022-09-30 LAB — HEMOGLOBIN A1C: Hgb A1c MFr Bld: 7.1 % — ABNORMAL HIGH (ref 4.6–6.5)

## 2022-09-30 NOTE — Assessment & Plan Note (Signed)
Stable, tolerating current regimen. Medications refilled. Labs pending as above.   

## 2022-09-30 NOTE — Assessment & Plan Note (Signed)
Overall stable, continue gabapentin

## 2022-09-30 NOTE — Assessment & Plan Note (Signed)
Improved readings, doing well. Continue same med regimen, check labs with adjustment of plan accordingly.

## 2022-10-05 ENCOUNTER — Ambulatory Visit (INDEPENDENT_AMBULATORY_CARE_PROVIDER_SITE_OTHER): Payer: BC Managed Care – PPO | Admitting: Licensed Clinical Social Worker

## 2022-10-05 DIAGNOSIS — F411 Generalized anxiety disorder: Secondary | ICD-10-CM | POA: Diagnosis not present

## 2022-10-05 DIAGNOSIS — F330 Major depressive disorder, recurrent, mild: Secondary | ICD-10-CM | POA: Diagnosis not present
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
## 2022-10-07 ENCOUNTER — Other Ambulatory Visit (HOSPITAL_COMMUNITY): Payer: Self-pay | Admitting: Psychiatry

## 2022-10-07 DIAGNOSIS — F330 Major depressive disorder, recurrent, mild: Secondary | ICD-10-CM

## 2022-10-15 ENCOUNTER — Encounter: Payer: Self-pay | Admitting: Family Medicine

## 2022-10-15 MED ORDER — FREESTYLE LIBRE 3 SENSOR MISC: 0 refills | Status: DC

## 2022-10-15 NOTE — Telephone Encounter (Signed)
Pt reports the trial and failure of several products any advice you may have?

## 2022-10-15 NOTE — Addendum Note (Signed)
Addended by: Eldred Manges on: 10/15/2022 04:16 PM   Modules accepted: Orders

## 2022-10-15 NOTE — Addendum Note (Signed)
Addended by: Eldred Manges on: 10/15/2022 04:10 PM   Modules accepted: Orders

## 2022-11-01 ENCOUNTER — Telehealth: Payer: Self-pay | Admitting: Family Medicine

## 2022-11-01 NOTE — Telephone Encounter (Signed)
Encourage patient to contact the pharmacy for refills or they can request refills through [facility]   WHAT PHARMACY WOULD THEY LIKE THIS SENT TO:  [id number] - [place], [place] - [address]   MEDICATION NAME & DOSE: Dulaglutide (TRULICITY) 4.5 MG/0.5ML SOPN  NOTES/COMMENTS FROM PATIENT: Trulicity currently taking but now not covered 1 week = 5 pens/box --> 15 weeks no longer approving  6 pens week/ 4 boxes per month which equals 72 boxes for 3 months they will approve with Dr authorization.     Front office please notify patient: It takes 48-72 hours to process rx refill requests Ask patient to call pharmacy to ensure rx is ready before heading there.

## 2022-11-01 NOTE — Telephone Encounter (Signed)
Please begin PA processing

## 2022-11-04 ENCOUNTER — Encounter: Payer: Self-pay | Admitting: Family Medicine

## 2022-11-05 NOTE — Telephone Encounter (Signed)
Pt looking for an update as he nears being due for next dose

## 2022-11-08 ENCOUNTER — Other Ambulatory Visit (HOSPITAL_COMMUNITY): Payer: Self-pay

## 2022-11-08 ENCOUNTER — Telehealth: Payer: Self-pay

## 2022-11-08 NOTE — Telephone Encounter (Signed)
Pharmacy Patient Advocate Encounter   Received notification that prior authorization for Trulicity is required/requested.   PA submitted on 11/08/22 to (ins) [facility] via CoverMyMeds Key  # [id number] Status is pending

## 2022-11-09 DIAGNOSIS — G4733 Obstructive sleep apnea (adult) (pediatric): Secondary | ICD-10-CM | POA: Diagnosis not present

## 2022-11-23 ENCOUNTER — Encounter (HOSPITAL_COMMUNITY): Payer: Self-pay | Admitting: Psychiatry

## 2022-11-23 ENCOUNTER — Telehealth (HOSPITAL_BASED_OUTPATIENT_CLINIC_OR_DEPARTMENT_OTHER): Payer: BC Managed Care – PPO | Admitting: Psychiatry

## 2022-11-23 VITALS — Wt 355.0 lb

## 2022-11-23 DIAGNOSIS — F330 Major depressive disorder, recurrent, mild: Secondary | ICD-10-CM | POA: Diagnosis not present

## 2022-11-23 DIAGNOSIS — F411 Generalized anxiety disorder: Secondary | ICD-10-CM | POA: Diagnosis not present

## 2022-11-23 MED ORDER — LAMOTRIGINE 200 MG PO TABS: 200.0000 mg | ORAL_TABLET | Freq: Every day | ORAL | 0 refills | Status: DC

## 2022-11-23 MED ORDER — HYDROXYZINE PAMOATE 50 MG PO CAPS
ORAL_CAPSULE | ORAL | 0 refills | Status: DC
Start: 2022-11-23 — End: 2023-05-09

## 2022-11-23 MED ORDER — TRAZODONE HCL 100 MG PO TABS
100.0000 mg | ORAL_TABLET | Freq: Every evening | ORAL | 0 refills | Status: DC | PRN
Start: 2022-11-23 — End: 2023-05-09

## 2022-11-23 MED ORDER — SERTRALINE HCL 100 MG PO TABS
ORAL_TABLET | ORAL | 0 refills | Status: DC
Start: 2022-11-23 — End: 2023-05-09
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
## 2022-11-24 ENCOUNTER — Ambulatory Visit (INDEPENDENT_AMBULATORY_CARE_PROVIDER_SITE_OTHER): Payer: BC Managed Care – PPO | Admitting: Licensed Clinical Social Worker

## 2022-11-24 DIAGNOSIS — F330 Major depressive disorder, recurrent, mild: Secondary | ICD-10-CM

## 2022-11-24 DIAGNOSIS — F32A Depression, unspecified: Secondary | ICD-10-CM | POA: Diagnosis not present

## 2022-11-24 NOTE — Progress Notes (Signed)
Virtual Visit via Video Note  I connected with Matthew Hutchinson on 11/24/22 at  3:00 PM EDT by a video enabled telemedicine application and verified that I am speaking with the correct person using two identifiers.  Location: Patient: home Provider: remote office [place], [place])   I discussed the limitations of evaluation and management by telemedicine and the availability of in person appointments. The patient expressed understanding and agreed to proceed.   I discussed the assessment and treatment plan with the patient. The patient was provided an opportunity to ask questions and all were answered. The patient agreed with the plan and demonstrated an understanding of the instructions.   The patient was advised to call back or seek an in-person evaluation if the symptoms worsen or if the condition fails to improve as anticipated.  I provided 55 minutes of non-face-to-face time during this encounter.   Joanell Cressler R Nicole Defino, LCSW  THERAPIST PROGRESS NOTE  Session Time: 3-355p  Participation Level: Active  Behavioral Response: Neat and Well GroomedAlertDepressed  Type of Therapy: Individual Therapy  Treatment Goals addressed: Learn and implement coping skills that result in a reduction of anxiety and worry, and improve daily functioning per pt report 3 out of 5 sessions documented   Develop healthy thinking patterns and beliefs about self, others, and the world that lead to the alleviation and help prevent the relapse of depression per self report 3 out of 5 sessions documented   ProgressTowards Goals: Progressing  Interventions: CBT, Strength-based, and Supportive  Summary: Matthew Hutchinson is a 62 y.o. male who presents with improving symptoms related to depression diagnosis. Pt reports overall mood stable and that he is managing situational stressors and anxiety/panic well.   Clinician assisted pt with identifying situations/scenarios/schemas triggering anxiety  and/or depression symptoms. Allowed pt to explore and express thoughts and feelings and discussed current coping mechanisms. Reviewed changes/recommendations. Discussed inability to secure trulicity medication due to recent shortage. Allowed pt to explore problem-solving approaches he has used trying to find medication in surrounding areas. Pt happy with recent secure of lease--not happy with personal liability. Discussed concerns re: vehicle, health of [name], plumbing issues. Pt reports that he accepts challenges and focusing on solutions.  Clinician assisted pt with identifying current levels of chronic pain, and explored medical interventions for treating chronic pain. Discussed overall impact of pain on daily activities, relationships, and ability/inability to engage in self care or recreational activities. Reviewed keeping balance between rest and activity, and to continue communicating with medical providers for pain management. Reviewed mindfulness and meditation as relaxation interventions.   Continued recommendations are as follows: self care behaviors, positive social engagements, focusing on overall work/home/life balance, and focusing on positive physical and emotional wellness.   Suicidal/Homicidal: No  Therapist Response: Pt is continuing to apply interventions learned in session into daily life situations. Pt is currently on track to meet goals utilizing interventions mentioned above. Personal growth and progress noted. Treatment to continue as indicated.   Patient is doing a good job of identifying triggers, and utilizing helpful coping skills for managing symptoms.  Plan: Return again in 4 weeks.  Diagnosis:  No diagnosis found.   Collaboration of Care: Other pt encouraged to continue care with psychiatrist of record, Dr. Kathryne Sharper  Patient/Guardian was advised Release of Information must be obtained prior to any record release in order to collaborate their care with an outside  provider. Patient/Guardian was advised if they have not already done so to contact the registration department  to sign all necessary forms in order for [place] to release information regarding their care.   Consent: Patient/Guardian gives verbal consent for treatment and assignment of benefits for services provided during this visit. Patient/Guardian expressed understanding and agreed to proceed.   Ernest Haber Daiel Strohecker, LCSW 11/24/2022

## 2022-12-04 ENCOUNTER — Other Ambulatory Visit: Payer: Self-pay | Admitting: Family Medicine

## 2022-12-04 DIAGNOSIS — E1165 Type 2 diabetes mellitus with hyperglycemia: Secondary | ICD-10-CM

## 2022-12-07 ENCOUNTER — Encounter: Payer: Self-pay | Admitting: Family Medicine

## 2022-12-13 ENCOUNTER — Telehealth (INDEPENDENT_AMBULATORY_CARE_PROVIDER_SITE_OTHER): Payer: BC Managed Care – PPO | Admitting: Family Medicine

## 2022-12-13 ENCOUNTER — Encounter: Payer: Self-pay | Admitting: Family Medicine

## 2022-12-13 VITALS — Ht 74.0 in | Wt 355.0 lb

## 2022-12-13 DIAGNOSIS — M549 Dorsalgia, unspecified: Secondary | ICD-10-CM

## 2022-12-13 DIAGNOSIS — L84 Corns and callosities: Secondary | ICD-10-CM

## 2022-12-13 DIAGNOSIS — M25512 Pain in left shoulder: Secondary | ICD-10-CM | POA: Diagnosis not present

## 2022-12-13 DIAGNOSIS — E1165 Type 2 diabetes mellitus with hyperglycemia: Secondary | ICD-10-CM

## 2022-12-13 DIAGNOSIS — Z794 Long term (current) use of insulin: Secondary | ICD-10-CM

## 2022-12-13 NOTE — Progress Notes (Signed)
Virtual Visit via Video Note  I connected with Matthew Hutchinson on 12/13/22 at 1:11 PM by a video enabled telemedicine application and verified that I am speaking with the correct person using two identifiers.  Patient location: home, by self.  My location: office - [place].    I discussed the limitations, risks, security and privacy concerns of performing an evaluation and management service by telephone and the availability of in person appointments. I also discussed with the patient that there may be a patient responsible charge related to this service. The patient expressed understanding and agreed to proceed, consent obtained  Chief complaint:  Chief Complaint  Patient presents with   Follow-up    Virtual- Wants referral for Podiatry and referral to someone who can help with is posture.    History of Present Illness: Matthew Hutchinson is a 62 y.o. male  Foot concerns: History of type 2 diabetes, with hyperglycemia. Trouble caring for feet - some callous and bothersome over the years on toes. Has tried using pumice, toenail clippers for dead skin at times. Trouble reaching toes with flexibility. Curling of toenails if unable to clip.   Posture concern: Soreness in mid back to upper back for some time, thinks some is posture related - better if sitting up straight. Surgery in left shoulder in past. Some pain with terminal motion of left shoulder recently. Husband has been falling and he has had to pick up his husband with pulling up left hand. Feeling pain at shoulder blade and left shoulder blade - past 3-4 months felt more soreness. Has not seen ortho. Looking into posture correction brace. Many years ago saw ortho for left shoulder surgery adhesive capsulitis - 2000 or 2001.  Had PT in past - tries to do some of those same exercises.   Patient Active Problem List   Diagnosis Date Noted   Chronic pain of left lower extremity 09/30/2022   Type 2 diabetes mellitus  with hyperglycemia, with long-term current use of insulin (HCC) 09/30/2022   Hiccups 04/01/2022   Newly diagnosed diabetes (HCC) 07/27/2018   Morbid obesity (HCC) 02/02/2017   Chronic pain due to trauma 06/25/2013   Depression, recurrent (HCC) 11/09/2012   CAD (coronary artery disease) 02/21/2012   Lipid disorder 02/21/2012   Angina pectoris 09/27/2011   Obstructive sleep apnea 05/20/2010   Essential hypertension 04/17/2010   COPD mixed type (HCC) 04/17/2010   WEIGHT GAIN, ABNORMAL 04/17/2010   Past Medical History:  Diagnosis Date   Allergy    Anxiety    Arthritis    COPD (chronic obstructive pulmonary disease) (HCC)    Depression    Emphysema of lung (HCC)    Hyperlipidemia    Morbid obesity (HCC)    OSA (obstructive sleep apnea)    uses CPAP nightly   PONV (postoperative nausea and vomiting)    Unspecified essential hypertension    Past Surgical History:  Procedure Laterality Date   ESOPHAGOGASTRODUODENOSCOPY (EGD) WITH PROPOFOL N/A 10/16/2021   Procedure: ESOPHAGOGASTRODUODENOSCOPY (EGD) WITH PROPOFOL;  Surgeon: Jeani Hawking, MD;  Location: [facility];  Service: Endoscopy;  Laterality: N/A;   INSERTION OF MESH N/A 09/18/2015   Procedure: INSERTION OF MESH;  Surgeon: Abigail Miyamoto, MD;  Location: [facility];  Service: General;  Laterality: N/A;   LEG SURGERY Left    SAVORY DILATION N/A 10/16/2021   Procedure: SAVORY DILATION;  Surgeon: Jeani Hawking, MD;  Location: [facility];  Service: Endoscopy;  Laterality: N/A;   SHOULDER ARTHROSCOPY  Left    TONSILLECTOMY     UMBILICAL HERNIA REPAIR N/A 09/18/2015   Procedure: HERNIA REPAIR UMBILICAL ADULT WITH MESH ;  Surgeon: Abigail Miyamoto, MD;  Location: [facility];  Service: General;  Laterality: N/A;   Allergies  Allergen Reactions   Penicillins Anaphylaxis   Valsartan Itching and Swelling   Prior to Admission medications   Medication Sig Start Date End Date Taking? Authorizing Provider   amLODipine (NORVASC) 5 MG tablet Take 1 tablet (5 mg total) by mouth daily. 09/29/22  Yes Shade Flood, MD  aspirin 81 MG tablet Take 1 tablet (81 mg total) by mouth daily. 11/13/12  Yes [name], NP  b complex vitamins tablet Take 1 tablet by mouth daily.   Yes [provider]  calcium gluconate 500 MG tablet Take 500 mg by mouth daily.   Yes [provider]  Carboxymethylcellul-Glycerin (LUBRICATING EYE DROPS OP) Place 1 drop into both eyes daily as needed (dry eyes).   Yes [provider]  Cholecalciferol (VITAMIN D) 2000 UNITS tablet Take 1 tablet (2,000 Units total) by mouth daily. 11/13/12  Yes [name], NP  Continuous Blood Gluc Sensor (FREESTYLE LIBRE 3 SENSOR) MISC Apply as instructed, change every 10 days. 10/15/22  Yes Shade Flood, MD  Continuous Blood Gluc Sensor (FREESTYLE LIBRE 3 SENSOR) MISC Apply as instructed, change every 10 days. 10/15/22  Yes Shade Flood, MD  gabapentin (NEURONTIN) 300 MG capsule Take 2 capsules (600 mg total) by mouth 2 (two) times daily. 09/29/22  Yes Shade Flood, MD  GARLIC PO Take 161 mg by mouth daily.   Yes [provider]  hydrOXYzine (VISTARIL) 50 MG capsule TAKE 2 CAPSULES BY MOUTH AT BEDTIME 11/23/22  Yes Arfeen, Phillips Grout, MD  insulin glargine-yfgn (SEMGLEE, YFGN,) 100 UNIT/ML Pen INJECT 61 UNITS INTO THE SKIN DAILY 09/29/22  Yes Shade Flood, MD  lamoTRIgine (LAMICTAL) 200 MG tablet Take 1 tablet (200 mg total) by mouth daily. 11/23/22  Yes Arfeen, Phillips Grout, MD  losartan-hydrochlorothiazide (HYZAAR) 100-25 MG tablet Take 1 tablet by mouth daily. 09/29/22  Yes Shade Flood, MD  Lutein 40 MG CAPS Take 40 mg by mouth at bedtime.   Yes [provider]  magnesium gluconate (MAGONATE) 500 MG tablet Take 500 mg by mouth daily.   Yes [provider]  metFORMIN (GLUCOPHAGE) 500 MG tablet Take 1 tablet (500 mg total) by mouth 2 (two) times daily with a meal. 09/29/22  Yes Shade Flood, MD  Multiple Vitamin (MULTIVITAMIN) capsule Take 1 capsule by mouth daily. 11/13/12  Yes Lord, Herminio Heads, NP  nitroGLYCERIN (NITROSTAT) 0.4 MG SL tablet Place 1 tablet (0.4 mg total) under the tongue every 5 (five) minutes as needed for chest pain. If chest pain not resolved, after 3 doses 5 minutes apart--call 911 Patient taking differently: Place 0.4 mg under the tongue See admin instructions. Take 0.4 mg as needed for hiccups 11/13/12  Yes [name], NP  Potassium 99 MG TABS Take 99 mg by mouth at bedtime.   Yes [provider]  sertraline (ZOLOFT) 100 MG tablet TAKE 1 TABLET BY MOUTH DAILY 11/23/22  Yes Arfeen, Phillips Grout, MD  Shark Cartilage 740 MG CAPS Take 1 capsule by mouth 3 (three) times daily.   Yes [provider]  simvastatin (ZOCOR) 20 MG tablet TAKE 1 TABLET BY MOUTH EVERYDAY AT BEDTIME 09/29/22  Yes Shade Flood, MD  TECHLITE PLUS PEN  NEEDLES 32G X 4 MM MISC USE ONCE DAILY WITH LANTUS. DX E11.9, Z79.4 12/07/22  Yes Shade Flood, MD  traMADol (ULTRAM) 50 MG tablet Take 50 mg by mouth daily as needed for severe pain.   Yes [provider]  traZODone (DESYREL) 100 MG tablet Take 1 tablet (100 mg total) by mouth at bedtime as needed for sleep. 11/23/22  Yes Arfeen, Phillips Grout, MD  Cholecalciferol (VITAMIN D3) 1.25 MG (50000 UT) CAPS INSTILL 1 DROP BY OPHTHALMIC ROUTE 3 TIMES EVERY DAY INTO RIGHT EYE    [provider]  Dulaglutide (TRULICITY) 4.5 MG/0.5ML SOPN Inject 4.5 mg as directed once a week. 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 Education: college. Exercise: No.   Social Determinants of Health   Financial Resource Strain: Not on file  Food Insecurity: Not on file  Transportation Needs: Not on file  Physical Activity: Not on file  Stress: Not on file  Social Connections: Not on file  Intimate Partner Violence: Not on file    Observations/Objective: Vitals:   12/13/22 1251  Weight: (!) 355 lb (161 kg)  Height: 6\' 2"  (1.88 m)  Nontoxic appearance on video, speaking full sentences, no respiratory distress.  Overall appears to have intact abduction, flexion but discomfort at terminal flexion, abduction of left shoulder   Assessment and Plan: Type 2 diabetes mellitus with hyperglycemia, with long-term current use of insulin (HCC) Callus of foot  -Difficulty with footcare, history of diabetes.  Referred to podiatry for assessment of calluses, nail care, footcare.  Left shoulder pain, unspecified chronicity Upper back pain on left side  -Remote history adhesive capsulitis, intermittent stiffness, may be heading that route again.  Increased workload recently likely contributing.  Differential also includes rotator cuff tendinosis.  Upper back pain, posterior may be secondary to pain in shoulder.  Limited exam over video, but I anticipate he may need to see Ortho anyway for possible injection versus PT trial, I will refer to Ortho for assessment initially.  Okay to continue range of motion, previous exercises for adhesive capsulitis as those  have been helpful attempts at maintaining straight posture when sitting  Follow Up Instructions:    I discussed the assessment and treatment plan with the patient. The patient was provided an opportunity to ask questions and all were answered. The patient agreed with the plan and demonstrated an  understanding of the instructions.   The patient was advised to call back or seek an in-person evaluation if the symptoms worsen or if the condition fails to improve as anticipated.   Shade Flood, MD

## 2022-12-13 NOTE — Patient Instructions (Addendum)
I will refer you to podiatry and Ortho as we discussed.  Please let me know if there are questions.  Take care.

## 2022-12-20 DIAGNOSIS — M546 Pain in thoracic spine: Secondary | ICD-10-CM | POA: Insufficient documentation

## 2022-12-20 DIAGNOSIS — M25512 Pain in left shoulder: Secondary | ICD-10-CM | POA: Insufficient documentation

## 2022-12-28 ENCOUNTER — Encounter: Payer: Self-pay | Admitting: Podiatry

## 2022-12-28 ENCOUNTER — Ambulatory Visit (INDEPENDENT_AMBULATORY_CARE_PROVIDER_SITE_OTHER): Payer: BC Managed Care – PPO | Admitting: Podiatry

## 2022-12-28 DIAGNOSIS — E119 Type 2 diabetes mellitus without complications: Secondary | ICD-10-CM | POA: Diagnosis not present

## 2022-12-28 DIAGNOSIS — E1165 Type 2 diabetes mellitus with hyperglycemia: Secondary | ICD-10-CM

## 2022-12-28 DIAGNOSIS — Z794 Long term (current) use of insulin: Secondary | ICD-10-CM

## 2022-12-28 DIAGNOSIS — H35033 Hypertensive retinopathy, bilateral: Secondary | ICD-10-CM | POA: Diagnosis not present

## 2022-12-28 DIAGNOSIS — H26493 Other secondary cataract, bilateral: Secondary | ICD-10-CM | POA: Diagnosis not present

## 2022-12-28 DIAGNOSIS — H43812 Vitreous degeneration, left eye: Secondary | ICD-10-CM | POA: Diagnosis not present
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
## 2022-12-30 ENCOUNTER — Ambulatory Visit (INDEPENDENT_AMBULATORY_CARE_PROVIDER_SITE_OTHER): Payer: BC Managed Care – PPO | Admitting: Family Medicine

## 2022-12-30 ENCOUNTER — Encounter: Payer: Self-pay | Admitting: Family Medicine

## 2022-12-30 VITALS — BP 132/70 | HR 78 | Temp 98.4°F | Ht 74.0 in | Wt 353.4 lb

## 2022-12-30 DIAGNOSIS — E1165 Type 2 diabetes mellitus with hyperglycemia: Secondary | ICD-10-CM | POA: Diagnosis not present

## 2022-12-30 DIAGNOSIS — Z794 Long term (current) use of insulin: Secondary | ICD-10-CM

## 2022-12-30 MED ORDER — TIRZEPATIDE 5 MG/0.5ML ~~LOC~~ SOAJ
5.0000 mg | SUBCUTANEOUS | 1 refills | Status: DC
Start: 2022-12-30 — End: 2023-01-31

## 2022-12-30 NOTE — Progress Notes (Signed)
Subjective:  Patient ID: Matthew Hutchinson, male    DOB: 20-Jun-1961  Age: 62 y.o. MRN: [id number]  CC:  Chief Complaint  Patient presents with   Medical Management of Chronic Issues    Notes still not able to get trulicity    Eye Problem    Notes he was dx with Postierier Vitrious detachment this is an FYI     HPI Matthew Hutchinson presents for   Diabetes: Insulin-dependent with history of hyperglycemia, obesity.  A1c stable in March.  Treated with Semglee 61 units/day, Trulicity 4.5 mg/week, difficulty obtaining medication recently.  Off trulicity past 6 weeks.  He is on ARB with losartan HCTZ, statin with simvastatin 20 mg daily. Home readings fasting: 110-130 range.  Postprandial: 170-230, rare 300.  Symptomatic lows: none. Lowest past few weeks in 90 range.  Microalbumin: 3.7 on 06/30/2022  Followed by optho for vitreous detachment - will be seeing Dr. Dione Booze.   Lab Results  Component Value Date   HGBA1C 7.1 (H) 09/29/2022   HGBA1C 7.1 (H) 06/30/2022   HGBA1C 7.5 (A) 03/31/2022   Lab Results  Component Value Date   MICROALBUR 3.7 (H) 06/30/2022   LDLCALC 72 06/30/2022   CREATININE 0.95 09/29/2022      History Patient Active Problem List   Diagnosis Date Noted   Chronic pain of left lower extremity 09/30/2022   Type 2 diabetes mellitus with hyperglycemia, with long-term current use of insulin (HCC) 09/30/2022   Hiccups 04/01/2022   Newly diagnosed diabetes (HCC) 07/27/2018   Morbid obesity (HCC) 02/02/2017   Chronic pain due to trauma 06/25/2013   Depression, recurrent (HCC) 11/09/2012   CAD (coronary artery disease) 02/21/2012   Lipid disorder 02/21/2012   Angina pectoris 09/27/2011   Obstructive sleep apnea 05/20/2010   Essential hypertension 04/17/2010   COPD mixed type (HCC) 04/17/2010   WEIGHT GAIN, ABNORMAL 04/17/2010   Past Medical History:  Diagnosis Date   Allergy    Anxiety    Arthritis    COPD (chronic obstructive pulmonary  disease) (HCC)    Depression    Emphysema of lung (HCC)    Hyperlipidemia    Morbid obesity (HCC)    OSA (obstructive sleep apnea)    uses CPAP nightly   PONV (postoperative nausea and vomiting)    Unspecified essential hypertension    Past Surgical History:  Procedure Laterality Date   ESOPHAGOGASTRODUODENOSCOPY (EGD) WITH PROPOFOL N/A 10/16/2021   Procedure: ESOPHAGOGASTRODUODENOSCOPY (EGD) WITH PROPOFOL;  Surgeon: Jeani Hawking, MD;  Location: [facility];  Service: Endoscopy;  Laterality: N/A;   INSERTION OF MESH N/A 09/18/2015   Procedure: INSERTION OF MESH;  Surgeon: Abigail Miyamoto, MD;  Location: [facility];  Service: General;  Laterality: N/A;   LEG SURGERY Left    SAVORY DILATION N/A 10/16/2021   Procedure: SAVORY DILATION;  Surgeon: Jeani Hawking, MD;  Location: [facility];  Service: Endoscopy;  Laterality: N/A;   SHOULDER ARTHROSCOPY Left    TONSILLECTOMY     UMBILICAL HERNIA REPAIR N/A 09/18/2015   Procedure: HERNIA REPAIR UMBILICAL ADULT WITH MESH ;  Surgeon: Abigail Miyamoto, MD;  Location: [facility];  Service: General;  Laterality: N/A;   Allergies  Allergen Reactions   Penicillins Anaphylaxis   Valsartan Itching and Swelling   Prior to Admission medications   Medication Sig Start Date End Date Taking? Authorizing Provider  amLODipine (NORVASC) 5 MG tablet Take 1 tablet (5 mg total) by mouth daily. 09/29/22  Yes [name]  R, MD  aspirin 81 MG tablet Take 1 tablet (81 mg total) by mouth daily. 11/13/12  Yes [name], NP  b complex vitamins tablet Take 1 tablet by mouth daily.   Yes [provider]  calcium gluconate 500 MG tablet Take 500 mg by mouth daily.   Yes [provider]  Carboxymethylcellul-Glycerin (LUBRICATING EYE DROPS OP) Place 1 drop into both eyes daily as needed (dry eyes).   Yes [provider]  Cholecalciferol (VITAMIN D) 2000 UNITS tablet Take 1 tablet (2,000 Units total) by mouth  daily. 11/13/12  Yes [name], NP  Continuous Blood Gluc Sensor (FREESTYLE LIBRE 3 SENSOR) MISC Apply as instructed, change every 10 days. 10/15/22  Yes Shade Flood, MD  Continuous Blood Gluc Sensor (FREESTYLE LIBRE 3 SENSOR) MISC Apply as instructed, change every 10 days. 10/15/22  Yes Shade Flood, MD  gabapentin (NEURONTIN) 300 MG capsule Take 2 capsules (600 mg total) by mouth 2 (two) times daily. 09/29/22  Yes Shade Flood, MD  GARLIC PO Take 540 mg by mouth daily.   Yes [provider]  hydrOXYzine (VISTARIL) 50 MG capsule TAKE 2 CAPSULES BY MOUTH AT BEDTIME 11/23/22  Yes Arfeen, Phillips Grout, MD  insulin glargine-yfgn (SEMGLEE, YFGN,) 100 UNIT/ML Pen INJECT 61 UNITS INTO THE SKIN DAILY 09/29/22  Yes Shade Flood, MD  lamoTRIgine (LAMICTAL) 200 MG tablet Take 1 tablet (200 mg total) by mouth daily. 11/23/22  Yes Arfeen, Phillips Grout, MD  losartan-hydrochlorothiazide (HYZAAR) 100-25 MG tablet Take 1 tablet by mouth daily. 09/29/22  Yes Shade Flood, MD  Lutein 40 MG CAPS Take 40 mg by mouth at bedtime.   Yes [provider]  magnesium gluconate (MAGONATE) 500 MG tablet Take 500 mg by mouth daily.   Yes [provider]  metFORMIN (GLUCOPHAGE) 500 MG tablet Take 1 tablet (500 mg total) by mouth 2 (two) times daily with a meal. 09/29/22  Yes Shade Flood, MD  Multiple Vitamin (MULTIVITAMIN) capsule Take 1 capsule by mouth daily. 11/13/12  Yes Lord, Herminio Heads, NP  nitroGLYCERIN (NITROSTAT) 0.4 MG SL tablet Place 1 tablet (0.4 mg total) under the tongue every 5 (five) minutes as needed for chest pain. If chest pain not resolved, after 3 doses 5 minutes apart--call 911 Patient taking differently: Place 0.4 mg under the tongue See admin instructions. Take 0.4 mg as needed for hiccups 11/13/12  Yes [name], NP  Potassium 99 MG TABS Take 99 mg by mouth at bedtime.   Yes [provider]  sertraline (ZOLOFT) 100 MG tablet TAKE 1 TABLET BY MOUTH DAILY  11/23/22  Yes Arfeen, Phillips Grout, MD  Shark Cartilage 740 MG CAPS Take 1 capsule by mouth 3 (three) times daily.   Yes [provider]  simvastatin (ZOCOR) 20 MG tablet TAKE 1 TABLET BY MOUTH EVERYDAY AT BEDTIME 09/29/22  Yes Shade Flood, MD  TECHLITE PLUS PEN NEEDLES 32G X 4 MM MISC USE ONCE DAILY WITH LANTUS. DX E11.9, Z79.4 12/07/22  Yes Shade Flood, MD  traMADol (ULTRAM) 50 MG tablet Take 50 mg by mouth daily as needed for severe pain.   Yes [provider]  traZODone (DESYREL) 100 MG tablet Take 1 tablet (100 mg total) by mouth at bedtime as needed for sleep. 11/23/22  Yes Arfeen, Phillips Grout, MD  Cholecalciferol (VITAMIN D3) 1.25 MG (50000 UT) CAPS INSTILL 1 DROP BY OPHTHALMIC ROUTE 3 TIMES EVERY DAY INTO RIGHT EYE  [provider]  Dulaglutide (TRULICITY) 4.5 MG/0.5ML SOPN Inject 4.5 mg as directed once a week. 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 Education: college. Exercise: No.   Social Determinants of Health   Financial Resource Strain: Low Risk   (12/26/2022)   Overall Financial Resource Strain (CARDIA)    Difficulty of Paying Living Expenses: Not hard at all  Food Insecurity: No Food Insecurity (12/26/2022)   Hunger Vital Sign    Worried About Running Out of Food in the Last Year: Never true    Ran Out of Food in the Last Year: Never true  Transportation Needs: No Transportation Needs (12/26/2022)   PRAPARE - Administrator, Civil Service (Medical): No    Lack of Transportation (Non-Medical): No  Physical Activity: Unknown (12/26/2022)   Exercise Vital Sign    Days of Exercise per Week: 0 days    Minutes of Exercise per Session: Not on file  Stress: Stress Concern Present (12/26/2022)   [employer] of Occupational Health - Occupational Stress Questionnaire    Feeling of Stress : To some extent  Social Connections: Moderately Isolated (12/26/2022)   Social Connection and Isolation Panel [NHANES]    Frequency of Communication with Friends and Family: More than three times a week    Frequency of Social Gatherings with Friends and Family: Never    Attends Religious Services: Never    Database administrator or Organizations: No    Attends Engineer, structural: Not on file    Marital Status: Married  Catering manager Violence: Not on file    Review of Systems Per HPI  Objective:   Vitals:   12/30/22 1543  BP: 132/70  Pulse: 78  Temp: 98.4 F (36.9 C)  TempSrc: Temporal  SpO2: 96%  Weight: (!) 353 lb 6.4 oz (160.3 kg)  Height: 6\' 2"  (1.88 m)     Physical Exam Vitals reviewed.  Constitutional:      Appearance: He is well-developed.  HENT:     Head: Normocephalic and atraumatic.  Neck:     Vascular: No carotid bruit or JVD.  Cardiovascular:     Rate and Rhythm: Normal rate and regular rhythm.     Heart sounds: Normal heart sounds. No murmur heard. Pulmonary:     Effort: Pulmonary effort is normal.     Breath  sounds: Normal breath sounds. No rales.  Musculoskeletal:     Right lower leg:  No edema.     Left lower leg: No edema.  Skin:    General: Skin is warm and dry.  Neurological:     Mental Status: He is alert and oriented to person, place, and time.  Psychiatric:        Mood and Affect: Mood normal.        Assessment & Plan:  TAKERU MCADEN is a 62 y.o. male . Type 2 diabetes mellitus with hyperglycemia, with long-term current use of insulin (HCC) - Plan: tirzepatide (MOUNJARO) 5 MG/0.5ML Pen Uncontrolled, primarily postprandials.  Unfortunately has had difficulty with availability of Trulicity.  Will try switching to [facility].  Initially 5 mg, likely will need a stronger dose but will start here and increase if tolerated.  Virtual visit in 1 month for recheck.  Deferred labs at this time given likely elevated A1c, off Trulicity as above.  3 months recheck labs.  Meds ordered this encounter  Medications   tirzepatide (MOUNJARO) 5 MG/0.5ML Pen    Sig: Inject 5 mg into the skin once a week.    Dispense:  2 mL    Refill:  1   Patient Instructions  We can try Mounjaro in place of trulicity. Will start at 5mg  but likely will need to increase dose - this can be done every 4 weeks. Follow up in 1 month - virtual, then will plan labs in 3 months.   No change in insulin at this time.   If any persistent readings over 250, or low readings let me know.   Hang in there.       Signed,   [name], MD [facility], [facility] 12/30/22 11:28 PM

## 2022-12-30 NOTE — Patient Instructions (Addendum)
We can try Mounjaro in place of trulicity. Will start at 5mg  but likely will need to increase dose - this can be done every 4 weeks. Follow up in 1 month - virtual, then will plan labs in 3 months.   No change in insulin at this time.   If any persistent readings over 250, or low readings let me know.   Hang in there.

## 2022-12-31 DIAGNOSIS — M546 Pain in thoracic spine: Secondary | ICD-10-CM | POA: Diagnosis not present

## 2023-01-17 DIAGNOSIS — M546 Pain in thoracic spine: Secondary | ICD-10-CM | POA: Diagnosis not present

## 2023-01-18 DIAGNOSIS — H43812 Vitreous degeneration, left eye: Secondary | ICD-10-CM | POA: Diagnosis not present

## 2023-01-19 ENCOUNTER — Ambulatory Visit (INDEPENDENT_AMBULATORY_CARE_PROVIDER_SITE_OTHER): Payer: BC Managed Care – PPO | Admitting: Licensed Clinical Social Worker

## 2023-01-19 DIAGNOSIS — F330 Major depressive disorder, recurrent, mild: Secondary | ICD-10-CM | POA: Diagnosis not present

## 2023-01-19 DIAGNOSIS — F411 Generalized anxiety disorder: Secondary | ICD-10-CM | POA: Diagnosis not present

## 2023-01-19 NOTE — Progress Notes (Signed)
Virtual Visit via Video Note  I connected with Matthew Hutchinson on 01/19/23 at  2:00 PM EDT by a video enabled telemedicine application and verified that I am speaking with the correct person using two identifiers.  Location: Patient: home Provider:  [facility]    I discussed the limitations of evaluation and management by telemedicine and the availability of in person appointments. The patient expressed understanding and agreed to proceed.   I discussed the assessment and treatment plan with the patient. The patient was provided an opportunity to ask questions and all were answered. The patient agreed with the plan and demonstrated an understanding of the instructions.   The patient was advised to call back or seek an in-person evaluation if the symptoms worsen or if the condition fails to improve as anticipated.  I provided 60 minutes of non-face-to-face time during this encounter.   Kendra Grissett R Raequon Catanzaro, LCSW  THERAPIST PROGRESS NOTE  Session Time: 2-3p  Participation Level: Active  Behavioral Response: Neat and Well GroomedAlertDepressed  Type of Therapy: Individual Therapy  Treatment Goals addressed: Learn and implement coping skills that result in a reduction of anxiety and worry, and improve daily functioning per pt report 3 out of 5 sessions documented   Develop healthy thinking patterns and beliefs about self, others, and the world that lead to the alleviation and help prevent the relapse of depression per self report 3 out of 5 sessions documented   ProgressTowards Goals: Progressing  Interventions: CBT, Strength-based, and Supportive  Summary: Matthew Hutchinson is a 62 y.o. male who presents with improving symptoms related to depression diagnosis. Pt reports overall mood stable and that he is managing situational stressors and anxiety/panic well.   Pt reports ongoing medical concerns including ongoing diabetes  management and recent eye concerns. Allowed pt to identify current symptoms, treatments, and future treatment plans/recommendations.Clinician and patient continued to discuss problem solving regarding complex medical history, implementation and practice of relaxation activities and self-care, addressing impact of current medications, and encouraging patient to communicate thoughts and feelings to medical professionals, and anyone else in their inner circle. Pt waiting to see if he is eligible for brace to help w/ shoulder pain.  Explored current family-based issues/concerns. Pt reports that one of the main anxiety triggers related to family relationships is ongoing conflict regarding upkeep at one of the family owned properties. Pt fears he will be responsible for bills that have accrued by other family members. Discussed current ways that pt and family members are communicating, and discussed positive changes.  Discussed current conflict resolution and problem solving behaviors--pt does a grea job of identifying problems and brainstorming solutions.   Continued recommendations are as follows: self care behaviors, positive social engagements, focusing on overall work/home/life balance, and focusing on positive physical and emotional wellness.   Suicidal/Homicidal: No  Therapist Response: Pt is continuing to apply interventions learned in session into daily life situations. Pt is currently on track to meet goals utilizing interventions mentioned above. Personal growth and progress noted. Treatment to continue as indicated.   Patient is doing a good job of identifying triggers, and utilizing helpful coping skills for managing symptoms.  Plan: Informed patient that clinician will be leaving outpatient department. Allowed pt to explore any questions or concerns and discussed future counseling options/resources. Provided pt with psychoeducational resources and list of OPT therapists. Encouraged pt to continue  with psychiatric med management appointments, if applicable.   Diagnosis:  Encounter Diagnoses  Name Primary?  Major depressive disorder, recurrent episode, mild (HCC) Yes   GAD (generalized anxiety disorder)    Collaboration of Care: Other pt encouraged to continue care with psychiatrist of record, Dr. Kathryne Sharper  Patient/Guardian was advised Release of Information must be obtained prior to any record release in order to collaborate their care with an outside provider. Patient/Guardian was advised if they have not already done so to contact the registration department to sign all necessary forms in order for [place] to release information regarding their care.   Consent: Patient/Guardian gives verbal consent for treatment and assignment of benefits for services provided during this visit. Patient/Guardian expressed understanding and agreed to proceed.   Ernest Haber Matthew Hinderliter, LCSW 01/19/2023

## 2023-01-19 NOTE — Patient Instructions (Signed)
Outpatient Psychiatry and Counseling  FOR CRISIS:  call 911, [facility] Altern[facility]: Mobile Crisis Management 24 hours:  [phone], call [phone], [facility] ([facility]) [address] walk in, or go to your local EMERGENCY DEPARTMENT  [facility] [address], [place], [place] [address]  [phone]  [facility] [address] [place], [place] [address] [phone]  [facility] Address: [address] [place] [address], [place], [place] [address] Phone: [phone]  [facility] ([place] and [place] Locations) [email or web address]  [facility] sliding scale fee and walk in schedule: M-F 8am-12pm/1pm-3pm [address]  [place], [place] [address] [phone]  [facility] [address] [place], [place] [address] [phone]  [facility]/ Intensive Outpatient Therapy Program/CDIOP/PHP [address] [place], [place] [address] [phone]  [facility]                  Crisis Services, Outpatient Therapy Services, Walk in Services      [phone]     [address]    [place], [place] [address]                 [facility]   [facility] [phone] [address] [place], [place] [address]  [facility]          [address],  [place], [place] [address]       [phone]  [facility] [address] [place], [place] [address] [phone]  [facility]    [address]    [place], [place] [address]     [phone]       [facility] [address] [place] [address]  [facility]     [address]     [place], [place]      [phone]       [facility] Shamsher Ahluwalia, MD [address]  [address] [place], [place] [address] [phone]  [facility]     [address]     [place], [place] [address]     [phone]       [facility] [address] [place], [place] [address] [phone] Resources for Temporary Residential Assistance/Crisis Centers  

## 2023-01-31 ENCOUNTER — Encounter: Payer: Self-pay | Admitting: Family Medicine

## 2023-01-31 ENCOUNTER — Telehealth (INDEPENDENT_AMBULATORY_CARE_PROVIDER_SITE_OTHER): Payer: BC Managed Care – PPO | Admitting: Family Medicine

## 2023-01-31 DIAGNOSIS — E162 Hypoglycemia, unspecified: Secondary | ICD-10-CM | POA: Diagnosis not present

## 2023-01-31 DIAGNOSIS — Z794 Long term (current) use of insulin: Secondary | ICD-10-CM | POA: Diagnosis not present

## 2023-01-31 DIAGNOSIS — E1165 Type 2 diabetes mellitus with hyperglycemia: Secondary | ICD-10-CM

## 2023-01-31 MED ORDER — TIRZEPATIDE 5 MG/0.5ML ~~LOC~~ SOAJ: 5.0000 mg | SUBCUTANEOUS | 2 refills | Status: DC

## 2023-01-31 NOTE — Progress Notes (Signed)
Virtual Visit via Video Note  I connected with Matthew Hutchinson on 01/31/23 at 4:39 PM by a video enabled telemedicine application and verified that I am speaking with the correct person using two identifiers.  Patient location:home by self My location: office - [place].    I discussed the limitations, risks, security and privacy concerns of performing an evaluation and management service by telephone and the availability of in person appointments. I also discussed with the patient that there may be a patient responsible charge related to this service. The patient expressed understanding and agreed to proceed, consent obtained  Chief complaint:  Chief Complaint  Patient presents with   Diabetes    Pt doing well, no concerns from pt, pt latest BG is 98 4:12 01/31/2023 pt notes stays under 120 typically except with his morning coffees, side effects feel equal to trulicity and are manageable     History of Present Illness: Matthew Hutchinson is a 62 y.o. male  Diabetes: Insulin-dependent with history of hyperglycemia, obesity.  Discussed in June.  Still elevated readings at that time including postprandials.  Difficulty obtaining Trulicity, had been 4.5 mg/week but had been off his Trulicity for 6 weeks.  He is on ARB with losartan HCTZ and statin with simvastatin.  Semglee 61 units/day.  He was changed to [facility], initial 5 mg with likely need for increased doses. Has taken 5 total doses of Mounjaro. Home readings have improved.  Current reading 95, usually low 100's. Spikes to close to 200 with nondairy creamer, but usually meals 140-180, sometimes lower with meals.  Symptomatic low this morning at 59. Less intake yesterday than usual. This has been the only low. Not symptomatic. CGM woke up with that reading. Ate some glucose tabs.   Lab Results  Component Value Date   NA 139 09/29/2022   K 4.1 09/29/2022   CL 103 09/29/2022   CO2 28 09/29/2022   Lab Results   Component Value Date   HGBA1C 7.1 (H) 09/29/2022      Patient Active Problem List   Diagnosis Date Noted   Chronic pain of left lower extremity 09/30/2022   Type 2 diabetes mellitus with hyperglycemia, with long-term current use of insulin (HCC) 09/30/2022   Hiccups 04/01/2022   Newly diagnosed diabetes (HCC) 07/27/2018   Morbid obesity (HCC) 02/02/2017   Chronic pain due to trauma 06/25/2013   Depression, recurrent (HCC) 11/09/2012   CAD (coronary artery disease) 02/21/2012   Lipid disorder 02/21/2012   Angina pectoris 09/27/2011   Obstructive sleep apnea 05/20/2010   Essential hypertension 04/17/2010   COPD mixed type (HCC) 04/17/2010   WEIGHT GAIN, ABNORMAL 04/17/2010   Past Medical History:  Diagnosis Date   Allergy    Anxiety    Arthritis    COPD (chronic obstructive pulmonary disease) (HCC)    Depression    Emphysema of lung (HCC)    Hyperlipidemia    Morbid obesity (HCC)    OSA (obstructive sleep apnea)    uses CPAP nightly   PONV (postoperative nausea and vomiting)    Unspecified essential hypertension    Past Surgical History:  Procedure Laterality Date   ESOPHAGOGASTRODUODENOSCOPY (EGD) WITH PROPOFOL N/A 10/16/2021   Procedure: ESOPHAGOGASTRODUODENOSCOPY (EGD) WITH PROPOFOL;  Surgeon: Jeani Hawking, MD;  Location: [facility];  Service: Endoscopy;  Laterality: N/A;   INSERTION OF MESH N/A 09/18/2015   Procedure: INSERTION OF MESH;  Surgeon: Abigail Miyamoto, MD;  Location: [facility];  Service: General;  Laterality:  N/A;   LEG SURGERY Left    SAVORY DILATION N/A 10/16/2021   Procedure: SAVORY DILATION;  Surgeon: Jeani Hawking, MD;  Location: [facility];  Service: Endoscopy;  Laterality: N/A;   SHOULDER ARTHROSCOPY Left    TONSILLECTOMY     UMBILICAL HERNIA REPAIR N/A 09/18/2015   Procedure: HERNIA REPAIR UMBILICAL ADULT WITH MESH ;  Surgeon: Abigail Miyamoto, MD;  Location: [facility];  Service: General;  Laterality: N/A;    Allergies  Allergen Reactions   Penicillins Anaphylaxis   Valsartan Itching and Swelling   Prior to Admission medications   Medication Sig Start Date End Date Taking? Authorizing Provider  amLODipine (NORVASC) 5 MG tablet Take 1 tablet (5 mg total) by mouth daily. 09/29/22  Yes Shade Flood, MD  aspirin 81 MG tablet Take 1 tablet (81 mg total) by mouth daily. 11/13/12  Yes [name], NP  b complex vitamins tablet Take 1 tablet by mouth daily.   Yes [provider]  calcium gluconate 500 MG tablet Take 500 mg by mouth daily.   Yes [provider]  Carboxymethylcellul-Glycerin (LUBRICATING EYE DROPS OP) Place 1 drop into both eyes daily as needed (dry eyes).   Yes [provider]  Cholecalciferol (VITAMIN D) 2000 UNITS tablet Take 1 tablet (2,000 Units total) by mouth daily. 11/13/12  Yes [name], NP  Continuous Blood Gluc Sensor (FREESTYLE LIBRE 3 SENSOR) MISC Apply as instructed, change every 10 days. 10/15/22  Yes Shade Flood, MD  Continuous Blood Gluc Sensor (FREESTYLE LIBRE 3 SENSOR) MISC Apply as instructed, change every 10 days. 10/15/22  Yes Shade Flood, MD  diclofenac (VOLTAREN) 75 MG EC tablet Take 1 tablet by mouth 2 (two) times daily with a meal. 12/20/22  Yes [provider]  gabapentin (NEURONTIN) 300 MG capsule Take 2 capsules (600 mg total) by mouth 2 (two) times daily. 09/29/22  Yes Shade Flood, MD  GARLIC PO Take 403 mg by mouth daily.   Yes [provider]  hydrOXYzine (VISTARIL) 50 MG capsule TAKE 2 CAPSULES BY MOUTH AT BEDTIME 11/23/22  Yes Arfeen, Phillips Grout, MD  insulin glargine-yfgn (SEMGLEE, YFGN,) 100 UNIT/ML Pen INJECT 61 UNITS INTO THE SKIN DAILY 09/29/22  Yes Shade Flood, MD  lamoTRIgine (LAMICTAL) 200 MG tablet Take 1 tablet (200 mg total) by mouth daily. 11/23/22  Yes Arfeen, Phillips Grout, MD  losartan-hydrochlorothiazide (HYZAAR) 100-25 MG tablet Take 1 tablet by mouth daily. 09/29/22  Yes Shade Flood, MD  Lutein 40 MG CAPS Take 40 mg by mouth at bedtime.   Yes [provider]  magnesium gluconate (MAGONATE) 500 MG tablet Take 500 mg by mouth daily.   Yes [provider]  metFORMIN (GLUCOPHAGE) 500 MG tablet Take 1 tablet (500 mg total) by mouth 2 (two) times daily with a meal. 09/29/22  Yes Shade Flood, MD  Multiple Vitamin (MULTIVITAMIN) capsule Take 1 capsule by mouth daily. 11/13/12  Yes Lord, Herminio Heads, NP  nitroGLYCERIN (NITROSTAT) 0.4 MG SL tablet Place 1 tablet (0.4 mg total) under the tongue every 5 (five) minutes as needed for chest pain. If chest pain not resolved, after 3 doses 5 minutes apart--call 911 Patient taking differently: Place 0.4 mg under the tongue See admin instructions. Take 0.4 mg as needed for hiccups 11/13/12  Yes [name], NP  Potassium 99 MG TABS Take 99 mg by mouth at bedtime.   Yes [provider]  sertraline (ZOLOFT)  100 MG tablet TAKE 1 TABLET BY MOUTH DAILY 11/23/22  Yes Arfeen, Phillips Grout, MD  Shark Cartilage 740 MG CAPS Take 1 capsule by mouth 3 (three) times daily.   Yes [provider]  simvastatin (ZOCOR) 20 MG tablet TAKE 1 TABLET BY MOUTH EVERYDAY AT BEDTIME 09/29/22  Yes Shade Flood, MD  TECHLITE PLUS PEN NEEDLES 32G X 4 MM MISC USE ONCE DAILY WITH LANTUS. DX E11.9, Z79.4 12/07/22  Yes Shade Flood, MD  tirzepatide [facility]) 5 MG/0.5ML Pen Inject 5 mg into the skin once a week. 12/30/22  Yes Shade Flood, MD  traMADol (ULTRAM) 50 MG tablet Take 50 mg by mouth daily as needed for severe pain.   Yes [provider]  traZODone (DESYREL) 100 MG tablet Take 1 tablet (100 mg total) by mouth at bedtime as needed for sleep. 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  Tobacco Use   Smoking status: Former    Current packs/day: 0.00    Average packs/day: 2.0 packs/day for 26.0 years (52.0 ttl pk-yrs)    Types: Cigarettes    Start date: 08/12/1977    Quit date: 08/13/2003    Years since quitting: 19.4   Smokeless tobacco: Never   Tobacco comments:    2ppd x 26 years  Vaping Use   Vaping status: Never Used  Substance and Sexual Activity   Alcohol use: No    Alcohol/week: 0.0 standard drinks of alcohol    Comment: social   Drug use: No   Sexual activity: Yes    Partners: Male    Birth control/protection: None  Other Topics Concern   Not on file  Social History Narrative   Married. Education: college. Exercise: No.   Social Determinants of Health   Financial Resource Strain: Low Risk  (12/26/2022)   Overall Financial Resource Strain (CARDIA)    Difficulty of Paying Living Expenses: Not hard at all  Food Insecurity: No Food Insecurity (12/26/2022)   Hunger Vital Sign    Worried About Running Out of Food in the Last Year: Never true    Ran Out of Food in the Last Year: Never true  Transportation Needs: No Transportation Needs (12/26/2022)   PRAPARE - Administrator, Civil Service (Medical): No    Lack of Transportation (Non-Medical): No  Physical Activity: Unknown (12/26/2022)   Exercise Vital Sign    Days of Exercise per Week: 0 days    Minutes of Exercise per Session: Not on file  Stress: Stress Concern Present (12/26/2022)   [employer] of Occupational Health - Occupational Stress Questionnaire    Feeling of Stress : To some extent  Social Connections: Moderately Isolated (12/26/2022)   Social Connection and Isolation Panel [NHANES]    Frequency of Communication with Friends and Family: More than three times a week    Frequency of Social Gatherings with Friends and Family: Never    Attends Religious Services: Never    Database administrator or Organizations: No    Attends Museum/gallery exhibitions officer: Not on file    Marital Status: Married  Catering manager Violence: Not on file    Observations/Objective: There  were no vitals filed for this visit. Nontoxic appearance on video, speaking full sentences, no distress, no respiratory distress.  Appropriate responses.  All questions answered with understanding of plan expressed.  Assessment and Plan: Type 2 diabetes mellitus with hyperglycemia, with long-term current use of insulin (HCC)  Hypoglycemia Improved control with Mounjaro.  Single episode of hypoglycemia but predictable based on previous day intake.  Handout given on preventative techniques, continue to monitor with RTC precautions if that is more persistent.  Slight lower dose of Semglee for now based on well-controlled fastings.  9-month in office recheck.  Follow Up Instructions: 2 months, in office.   I discussed the assessment and treatment plan with the patient. The patient was provided an opportunity to ask questions and all were answered. The patient agreed with the plan and demonstrated an understanding of the instructions.   The patient was advised to call back or seek an in-person evaluation if the symptoms worsen or if the condition fails to improve as anticipated.   Shade Flood, MD

## 2023-01-31 NOTE — Patient Instructions (Addendum)
No change in [place] for now. Decrease semglee to 58 units, watch for any other low blood sugars.  If those do occur let me know.  Follow-up in 2 months, can recheck A1c at that time as well as other labs but let me know if there are questions in the meantime.  Take care.   Hypoglycemia Hypoglycemia is when the amount of sugar, or glucose, in your blood is too low. Low blood sugar can happen if you have diabetes or if you don't have diabetes. It may be an emergency. What are the causes? Low blood sugar happens most often in people who have diabetes. It may be caused by: Diabetes medicine. Not eating enough, or not eating often enough. Being more active than normal. If you don't have diabetes, you may still get low blood sugar if: There's a tumor in your pancreas. A tumor is a growth of cells that isn't normal. You don't eat enough, or you fast. Fasting is when you don't eat for long periods at a time. You have a bad infection or illness. You have problems after weight loss surgery. You have kidney or liver problems. You take certain medicines. What increases the risk? You're more likely to have low blood sugar if: You have diabetes and take medicine for it. You drink a lot of alcohol. You get sick. What are the signs or symptoms? Mild Hunger or feeling like you may vomit. Sweating and feeling cold to the touch. Feeling dizzy or light-headed. Being sleepy or having trouble sleeping. A headache. Blurry vision. Mood changes. These include feeling worried, nervous, or easily annoyed. Moderate Feeling confused. Changes in the way you act. Weakness. An uneven heartbeat. Very bad Having very low blood sugar is an emergency. It can cause: Fainting. Seizures. A coma. Death. How is this diagnosed?  Low blood sugar can be found with a blood test. This test tells you how much sugar is in your blood. It's done while you're having symptoms. Your health care provider may also do an exam  and look at your medical history. How is this treated? Treating low blood sugar If you have low blood sugar, eat or drink something with sugar in it right away. The food or drink should have 15 grams of a fast-acting carbohydrate (carb). Options include: 4 oz (120 mL) of fruit juice. 4 oz (120 mL) of soda (not diet soda). A few pieces of hard candy. Check food labels to see how many pieces to eat. 1 Tbsp (15 mL) of sugar or honey. 4 glucose tablets. 1 tube of glucose gel. Treating low blood sugar if you have diabetes Talk with your provider about how much carb you should take. If you're alert and can swallow safely, you may follow the 15:15 rule: Take 15 grams of a fast-acting carb. Check your blood sugar 15 minutes after you take the carb. If your blood sugar is still at or below 70 mg/dL (3.9 mmol/L), take 15 grams of a carb again. If your blood sugar doesn't go above 70 mg/dL (3.9 mmol/L) after 3 tries, get help right away. After your blood sugar goes back to normal, eat a meal or a snack within 1 hour. Always keep 15 grams of a fast-acting carb with you. This could be: 4 glucose tablets. A few pieces of hard candy. 1 Tbsp (15 mL) of honey or sugar. 1 tube of glucose gel. Treating very low blood sugar If your blood sugar is less than 54 mg/dL (3 mmol/L), it's an  emergency. Get help right away. If you can't eat or drink, you will need to be given glucagon. A family member or friend should learn how to check your blood sugar and give you glucagon. Ask your provider if you should keep a glucagon kit at home. You may also need to be treated in a hospital. Follow these instructions at home: If you have diabetes: Always keep a fast-acting carb (15 grams) with you. Follow your diabetes care plan. Make sure you: Know the symptoms of low blood sugar. Check your blood sugar as often as told. Always check it before and after you exercise. Always check your blood sugar before you drive. Take  your medicines as told. Eat on time. Do not skip meals. Share your diabetes care plan with: Your work or school. The people you live with. Wear an alert bracelet or carry a card that says you have diabetes. General instructions If you drink alcohol: Limit how much you have to: 0-1 drink a day if you're male. 0-2 drinks a day if you're male. Know how much alcohol is in your drink. In the [place] one drink is one 12 oz bottle of beer (355 mL), one 5 oz glass of wine (148 mL), or one 1 oz glass of hard liquor (44 mL). Be sure to eat food when you drink alcohol. Be sure to check your blood sugar after you drink. Alcohol may lead to low blood sugar later. Where to find more information American Diabetes Association (ADA): diabetes.org Contact a health care provider if: You have low blood sugar often. You have diabetes and are having trouble keeping your blood sugar in the right range. Get help right away if: You can't get your blood sugar above 70 mg/dL (3.9 mmol/L) after 3 tries. Your blood sugar is below 54 mg/dL (3 mmol/L). You have a seizure. You faint. These symptoms may be an emergency. Call 911 right away. Do not wait to see if the symptoms will go away. Do not drive yourself to the hospital. This information is not intended to replace advice given to you by your health care provider. Make sure you discuss any questions you have with your health care provider. Document Revised: 09/15/2022 Document Reviewed: 09/15/2022 Elsevier Patient Education  2024 [employer].

## 2023-02-01 DIAGNOSIS — L570 Actinic keratosis: Secondary | ICD-10-CM | POA: Diagnosis not present

## 2023-02-01 DIAGNOSIS — L812 Freckles: Secondary | ICD-10-CM | POA: Diagnosis not present

## 2023-02-01 NOTE — Progress Notes (Signed)
Called, lm to call back and schedule next OV

## 2023-02-07 ENCOUNTER — Encounter: Payer: Self-pay | Admitting: Family Medicine

## 2023-02-22 ENCOUNTER — Telehealth (HOSPITAL_COMMUNITY): Payer: BC Managed Care – PPO | Admitting: Psychiatry

## 2023-03-02 ENCOUNTER — Other Ambulatory Visit: Payer: Self-pay | Admitting: Family Medicine

## 2023-03-02 DIAGNOSIS — Z794 Long term (current) use of insulin: Secondary | ICD-10-CM

## 2023-03-19 ENCOUNTER — Other Ambulatory Visit (HOSPITAL_COMMUNITY): Payer: Self-pay | Admitting: Psychiatry

## 2023-03-19 ENCOUNTER — Other Ambulatory Visit: Payer: Self-pay | Admitting: Family Medicine

## 2023-03-19 DIAGNOSIS — G8929 Other chronic pain: Secondary | ICD-10-CM

## 2023-03-19 DIAGNOSIS — F330 Major depressive disorder, recurrent, mild: Secondary | ICD-10-CM

## 2023-03-21 DIAGNOSIS — G4733 Obstructive sleep apnea (adult) (pediatric): Secondary | ICD-10-CM | POA: Diagnosis not present

## 2023-03-24 ENCOUNTER — Encounter: Payer: Self-pay | Admitting: Family Medicine

## 2023-03-28 ENCOUNTER — Other Ambulatory Visit (HOSPITAL_COMMUNITY): Payer: Self-pay | Admitting: *Deleted

## 2023-03-28 DIAGNOSIS — F330 Major depressive disorder, recurrent, mild: Secondary | ICD-10-CM

## 2023-03-28 MED ORDER — LAMOTRIGINE 200 MG PO TABS: 200.0000 mg | ORAL_TABLET | Freq: Every day | ORAL | 0 refills | Status: DC

## 2023-03-30 ENCOUNTER — Ambulatory Visit (INDEPENDENT_AMBULATORY_CARE_PROVIDER_SITE_OTHER): Payer: BC Managed Care – PPO | Admitting: Podiatry

## 2023-03-30 ENCOUNTER — Encounter: Payer: Self-pay | Admitting: Podiatry

## 2023-03-30 DIAGNOSIS — M2041 Other hammer toe(s) (acquired), right foot: Secondary | ICD-10-CM | POA: Insufficient documentation

## 2023-03-30 DIAGNOSIS — M2042 Other hammer toe(s) (acquired), left foot: Secondary | ICD-10-CM | POA: Diagnosis not present

## 2023-03-30 DIAGNOSIS — Z794 Long term (current) use of insulin: Secondary | ICD-10-CM | POA: Diagnosis not present

## 2023-03-30 DIAGNOSIS — E1165 Type 2 diabetes mellitus with hyperglycemia: Secondary | ICD-10-CM | POA: Diagnosis not present

## 2023-03-30 NOTE — Progress Notes (Signed)
This patient returns to my office for at risk foot care.  This patient requires this care by a professional since this patient will be at risk due to having diabetes. This patient says his nails are painful in his 2,3 toes both feet.  He says the nails curve over the tip of his toes during walking. This patient presents for at risk foot care today.  General Appearance  Alert, conversant and in no acute stress.  Vascular  Dorsalis pedis and posterior tibial  pulses are palpable  bilaterally.  Capillary return is within normal limits  bilaterally. Temperature is within normal limits  bilaterally.  Neurologic  Senn-Weinstein monofilament wire test within normal limits  bilaterally. Muscle power within normal limits bilaterally.  Nails Thick disfigured discolored nails with subungual debris  from hallux to fifth toes bilaterally. No evidence of bacterial infection or drainage bilaterally.  Orthopedic  No limitations of motion  feet .  No crepitus or effusions noted.  No bony pathology or digital deformities noted. Hammer toes  2-4  B/L.  Skin  normotropic skin with no porokeratosis noted bilaterally.  No signs of infections or ulcers noted.     Hammer toes 2-4  B/L.    Consent was obtained for treatment procedures.   Discussed hammer toes with this patient.  To consider crest pad in future.  RTC 9 weeks.  Helane Gunther DPM    Return office visit                     Told patient to return for periodic foot care and evaluation due to potential at risk complications.   Helane Gunther DPM

## 2023-04-06 ENCOUNTER — Telehealth (HOSPITAL_COMMUNITY): Payer: BC Managed Care – PPO | Admitting: Psychiatry

## 2023-04-06 NOTE — Progress Notes (Signed)
HPI M former smoker followed for OSA, complicated by Morbid Obesity, HBP, CAD, COPD, DM2, Depression, Chronic Pain NPSG 06/08/09 AHI 14.8/ hr, desaturation to 83%, body weight 298 lbs Spirometry 10/16/12- moderate restriction with mild obstruction likely --------------------------------------------------------------------------------   04/01/22-  61 yoM former smoker(52 pkyrs) followed for OSA, complicated by Morbid Obesity, HBP, CAD, COPD, DM2, Depression, Chronic Pain CPAP  auto 5-20   / [employer] Dreamstation Auto  Download-compliance not available Body weight today-350 lbs Covid vax-5 Phizer,  Flu vax-had Got replacement Dream Station machine and pressure ok with regular use. His preferred brand of nasal pillows is no longer being made apparently. 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 Apparently has never tried thorazine. He will ask his doctors about this. [facility] (anesthesiologists) reportedly also treat this.  04/07/23- 62 yoM former smoker(52 pkyrs) followed for OSA, complicated by Morbid Obesity, HBP, CAD, COPD, DM2, Depression, Chronic Pain -trazodone 100, no inhalers CPAP  auto 5-20   / [employer] Dreamstation Auto  Download-compliance -100%, AHI 2.4/hr Body weight today-350 lbs Plans flu vax from PCP He is happy with his machine, which he says is about a year old. Minor wheeze occasionally. Inhalers were no help.                                     //need PFT??//  ROS-see HPI   + = positive Constitutional:    weight loss, night sweats, fevers, chills, fatigue, lassitude. HEENT:    +headaches, difficulty swallowing, tooth/dental problems, sore throat,       sneezing, itching, ear ache, nasal congestion, post nasal drip, snoring CV:    +chest pain, orthopnea, PND, swelling in lower extremities, anasarca,                                   dizziness, palpitations Resp:  + shortness of breath  with exertion or at rest.                productive cough,   +non-productive cough, coughing up of blood.              change in color of mucus.  wheezing.   Skin:    rash or lesions. GI:  No-   heartburn, indigestion, abdominal pain, nausea, vomiting, diarrhea, +hiccups,                change in bowel habits, loss of appetite GU: dysuria, change in color of urine, no urgency or frequency.   flank pain. MS:   +joint pain, stiffness, decreased range of motion, back pain. Neuro-     nothing unusual Psych:  change in mood or affect.  +depression or anxiety.   memory loss.  OBJ- Physical Exam, + morbidly obese General- Alert, Oriented, Affect-appropriate, Distress- none acute Skin- rash-none, lesions- none, excoriation- none Lymphadenopathy- none Head- atraumatic            Eyes- Gross vision intact, PERRLA, conjunctivae and secretions clear            Ears- Hearing, canals-normal            Nose- Clear, no-Septal dev, mucus, polyps, erosion, perforation             Throat- Mallampati IV , mucosa clear , drainage- none, tonsils-  atrophic Neck- flexible , trachea midline, no stridor , thyroid nl, carotid no bruit Chest - symmetrical excursion , unlabored           Heart/CV- RRR ,  murmur + 1-2/6 S, no gallop  , no rub, nl s1 s2                           - JVD- none , edema+1, stasis changes- none, varices- none           Lung- +quiet, wheeze- none, cough- none , dullness-none, rub- none           Chest wall-  Abd-  Br/ Gen/ Rectal- Not done, not indicated Extrem- cyanosis- none, clubbing, none, atrophy- none, strength- nl, +cane Neuro- grossly intact to observation

## 2023-04-07 ENCOUNTER — Encounter: Payer: Self-pay | Admitting: Internal Medicine

## 2023-04-07 ENCOUNTER — Ambulatory Visit (INDEPENDENT_AMBULATORY_CARE_PROVIDER_SITE_OTHER): Payer: BC Managed Care – PPO | Admitting: Internal Medicine

## 2023-04-07 VITALS — BP 122/74 | HR 84 | Temp 97.0°F | Ht 74.0 in | Wt 350.0 lb

## 2023-04-07 DIAGNOSIS — G4733 Obstructive sleep apnea (adult) (pediatric): Secondary | ICD-10-CM

## 2023-04-07 DIAGNOSIS — J449 Chronic obstructive pulmonary disease, unspecified: Secondary | ICD-10-CM

## 2023-04-07 NOTE — Patient Instructions (Signed)
We can continue CPAP auto 5-20  Please call if we can help

## 2023-04-13 ENCOUNTER — Telehealth (HOSPITAL_COMMUNITY): Payer: BC Managed Care – PPO | Admitting: Psychiatry

## 2023-04-13 ENCOUNTER — Ambulatory Visit: Payer: BC Managed Care – PPO | Admitting: Family Medicine

## 2023-04-14 ENCOUNTER — Ambulatory Visit: Payer: BC Managed Care – PPO | Admitting: Family Medicine

## 2023-04-15 ENCOUNTER — Encounter: Payer: Self-pay | Admitting: Internal Medicine

## 2023-04-15 NOTE — Assessment & Plan Note (Signed)
Encourage weight loss. Likely will need external support program

## 2023-04-15 NOTE — Assessment & Plan Note (Signed)
Former smoker, but primary limitation is likely his morbid obesity. Hasn't noted benefit from bronchodilators Plan- consider full PFT

## 2023-04-15 NOTE — Assessment & Plan Note (Signed)
Benefits from CPAP with good compliance and control. Apparently he got a replacement Dream Station. Plan- continue auto 5-20

## 2023-04-20 ENCOUNTER — Ambulatory Visit (INDEPENDENT_AMBULATORY_CARE_PROVIDER_SITE_OTHER): Payer: BC Managed Care – PPO | Admitting: Family Medicine

## 2023-04-20 ENCOUNTER — Encounter: Payer: Self-pay | Admitting: Family Medicine

## 2023-04-20 VITALS — BP 132/74 | HR 82 | Temp 97.8°F | Wt 345.0 lb

## 2023-04-20 DIAGNOSIS — Z23 Encounter for immunization: Secondary | ICD-10-CM | POA: Diagnosis not present

## 2023-04-20 DIAGNOSIS — E785 Hyperlipidemia, unspecified: Secondary | ICD-10-CM | POA: Diagnosis not present

## 2023-04-20 DIAGNOSIS — Z7985 Long-term (current) use of injectable non-insulin antidiabetic drugs: Secondary | ICD-10-CM | POA: Diagnosis not present

## 2023-04-20 DIAGNOSIS — Z794 Long term (current) use of insulin: Secondary | ICD-10-CM

## 2023-04-20 DIAGNOSIS — E1165 Type 2 diabetes mellitus with hyperglycemia: Secondary | ICD-10-CM

## 2023-04-20 DIAGNOSIS — I10 Essential (primary) hypertension: Secondary | ICD-10-CM | POA: Diagnosis not present

## 2023-04-20 MED ORDER — LOSARTAN POTASSIUM-HCTZ 100-25 MG PO TABS: 1.0000 | ORAL_TABLET | Freq: Every day | ORAL | 1 refills | Status: DC

## 2023-04-20 MED ORDER — TIRZEPATIDE 5 MG/0.5ML ~~LOC~~ SOAJ
5.0000 mg | SUBCUTANEOUS | 2 refills | Status: DC
Start: 2023-04-20 — End: 2023-07-28

## 2023-04-20 MED ORDER — AMLODIPINE BESYLATE 5 MG PO TABS
5.0000 mg | ORAL_TABLET | Freq: Every day | ORAL | 1 refills | Status: DC
Start: 2023-04-20 — End: 2023-11-14

## 2023-04-20 MED ORDER — METFORMIN HCL 500 MG PO TABS
500.0000 mg | ORAL_TABLET | Freq: Two times a day (BID) | ORAL | 1 refills | Status: DC
Start: 2023-04-20 — End: 2023-11-07

## 2023-04-20 NOTE — Patient Instructions (Addendum)
Thanks for coming in today.  No med changes at this time.   If any concerns on A1c or other labs I will let you know.  Follow-up in 3 months but if any changes in the meantime let me know.  Take care!

## 2023-04-20 NOTE — Progress Notes (Signed)
Subjective:  Patient ID: GENT RACK, male    DOB: [birth year]  Age: 62 y.o. MRN: [id number]  CC:  Chief Complaint  Patient presents with   Medical Management of Chronic Issues    Yes to FLU    HPI Matthew Hutchinson presents for   Diabetes:  With hyperglycemia, obesity.  Previous difficulty obtaining Trulicity, changed to [employer].  He is on ARB with losartan HCTZ, simvastatin for statin, Semglee 61 units/day. Last discussed in July, readings in the low 100s, sometimes up to 200 with nondairy creamer but usually mid to high 100s.  Single symptomatic low that morning but less intake a day prior and has CGM for monitoring.  Hypoglycemia with precautions and prevention techniques discussed.  We also decreased his Semglee to 58 units to decrease risk of hypoglycemia. Recent home readings fasting: 120-140 - still only eating 1 meal at night, snack in afternoon.  Postprandial - occasional 200 after coffee.  Symptomatic lows none in past 90 days.  Mounjaro 5mg  per week. No new n/v/abd pain. No new med side effects.  Microalbumin:  3.7 on 06/30/2022.  Optho, foot exam, pneumovax: utd.  Flu vaccine today. Covid booster this weekend.  Followed by derm with hx of skin CA, and podiatry regularly.   Lab Results  Component Value Date   HGBA1C 7.1 (H) 09/29/2022   HGBA1C 7.1 (H) 06/30/2022   HGBA1C 7.5 (A) 03/31/2022   Lab Results  Component Value Date   MICROALBUR 3.7 (H) 06/30/2022   LDLCALC 72 06/30/2022   CREATININE 0.95 09/29/2022   Hyperlipidemia: No new side effects with simvastatin. Some back soreness from lifting - treating at home. No new myalgias with meds.  Lab Results  Component Value Date   CHOL 135 06/30/2022   HDL 44.70 06/30/2022   LDLCALC 72 06/30/2022   TRIG 88.0 06/30/2022   CHOLHDL 3 06/30/2022   Lab Results  Component Value Date   ALT 24 09/29/2022   AST 25 09/29/2022   ALKPHOS 72 09/29/2022   BILITOT 0.7 09/29/2022    Hypertension: Losartan hCt 100/25mg  every day. Norvasc 5mg  every day.  No new side effects.  BP Readings from Last 3 Encounters:  04/20/23 132/74  04/07/23 122/74  12/30/22 132/70   Lab Results  Component Value Date   CREATININE 0.95 09/29/2022        History Patient Active Problem List   Diagnosis Date Noted   Hammer toes of both feet 03/30/2023   Chronic pain of left lower extremity 09/30/2022   Type 2 diabetes mellitus with hyperglycemia, with long-term current use of insulin (HCC) 09/30/2022   Hiccups 04/01/2022   Newly diagnosed diabetes (HCC) 07/27/2018   Morbid obesity (HCC) 02/02/2017   Chronic pain due to trauma 06/25/2013   Depression, recurrent (HCC) 11/09/2012   CAD (coronary artery disease) 02/21/2012   Lipid disorder 02/21/2012   Angina pectoris (HCC) 09/27/2011   Obstructive sleep apnea 05/20/2010   Essential hypertension 04/17/2010   COPD mixed type (HCC) 04/17/2010   WEIGHT GAIN, ABNORMAL 04/17/2010   Past Medical History:  Diagnosis Date   Allergy    Anxiety    Arthritis    COPD (chronic obstructive pulmonary disease) (HCC)    Depression    Emphysema of lung (HCC)    Hyperlipidemia    Morbid obesity (HCC)    OSA (obstructive sleep apnea)    uses CPAP nightly   PONV (postoperative nausea and vomiting)    Unspecified essential hypertension    Past  Surgical History:  Procedure Laterality Date   ESOPHAGOGASTRODUODENOSCOPY (EGD) WITH PROPOFOL N/A 10/16/2021   Procedure: ESOPHAGOGASTRODUODENOSCOPY (EGD) WITH PROPOFOL;  Surgeon: Jeani Hawking, MD;  Location: [facility];  Service: Endoscopy;  Laterality: N/A;   INSERTION OF MESH N/A 09/18/2015   Procedure: INSERTION OF MESH;  Surgeon: Abigail Miyamoto, MD;  Location: [facility];  Service: General;  Laterality: N/A;   LEG SURGERY Left    SAVORY DILATION N/A 10/16/2021   Procedure: SAVORY DILATION;  Surgeon: Jeani Hawking, MD;  Location: [facility];  Service: Endoscopy;  Laterality:  N/A;   SHOULDER ARTHROSCOPY Left    TONSILLECTOMY     UMBILICAL HERNIA REPAIR N/A 09/18/2015   Procedure: HERNIA REPAIR UMBILICAL ADULT WITH MESH ;  Surgeon: Abigail Miyamoto, MD;  Location: [facility];  Service: General;  Laterality: N/A;   Allergies  Allergen Reactions   Penicillins Anaphylaxis   Valsartan Itching and Swelling   Prior to Admission medications   Medication Sig Start Date End Date Taking? Authorizing Provider  amLODipine (NORVASC) 5 MG tablet Take 1 tablet (5 mg total) by mouth daily. 09/29/22  Yes Shade Flood, MD  aspirin 81 MG tablet Take 1 tablet (81 mg total) by mouth daily. 11/13/12  Yes [name], NP  b complex vitamins tablet Take 1 tablet by mouth daily.   Yes [provider]  calcium gluconate 500 MG tablet Take 500 mg by mouth daily.   Yes [provider]  Carboxymethylcellul-Glycerin (LUBRICATING EYE DROPS OP) Place 1 drop into both eyes daily as needed (dry eyes).   Yes [provider]  Cholecalciferol (VITAMIN D) 2000 UNITS tablet Take 1 tablet (2,000 Units total) by mouth daily. 11/13/12  Yes [name], NP  Continuous Blood Gluc Sensor (FREESTYLE LIBRE 3 SENSOR) MISC Apply as instructed, change every 10 days. 10/15/22  Yes Shade Flood, MD  Continuous Blood Gluc Sensor (FREESTYLE LIBRE 3 SENSOR) MISC Apply as instructed, change every 10 days. 10/15/22  Yes Shade Flood, MD  diclofenac (VOLTAREN) 75 MG EC tablet Take 1 tablet by mouth 2 (two) times daily with a meal. 12/20/22  Yes [provider]  gabapentin (NEURONTIN) 300 MG capsule TAKE 2 CAPSULES BY MOUTH 2 TIMES DAILY. 03/21/23  Yes Shade Flood, MD  GARLIC PO Take 657 mg by mouth daily.   Yes [provider]  hydrOXYzine (VISTARIL) 50 MG capsule TAKE 2 CAPSULES BY MOUTH AT BEDTIME 11/23/22  Yes Arfeen, Phillips Grout, MD  insulin glargine-yfgn (SEMGLEE, YFGN,) 100 UNIT/ML Pen INJECT 61 UNITS INTO THE SKIN DAILY 03/02/23  Yes Shade Flood, MD  lamoTRIgine (LAMICTAL) 200 MG tablet Take 1 tablet (200 mg total) by mouth daily. 03/28/23  Yes Arfeen, Phillips Grout, MD  losartan-hydrochlorothiazide (HYZAAR) 100-25 MG tablet Take 1 tablet by mouth daily. 09/29/22  Yes Shade Flood, MD  Lutein 40 MG CAPS Take 40 mg by mouth at bedtime.   Yes [provider]  magnesium gluconate (MAGONATE) 500 MG tablet Take 500 mg by mouth daily.   Yes [provider]  metFORMIN (GLUCOPHAGE) 500 MG tablet Take 1 tablet (500 mg total) by mouth 2 (two) times daily with a meal. 09/29/22  Yes Shade Flood, MD  Multiple Vitamin (MULTIVITAMIN) capsule Take 1 capsule by mouth daily. 11/13/12  Yes [name], NP  nitroGLYCERIN (NITROSTAT) 0.4 MG SL tablet Place 1 tablet (0.4 mg total) under the tongue every 5 (five) minutes as needed for  chest pain. If chest pain not resolved, after 3 doses 5 minutes apart--call 911 Patient taking differently: Place 0.4 mg under the tongue See admin instructions. Take 0.4 mg as needed for hiccups 11/13/12  Yes [name], NP  Potassium 99 MG TABS Take 99 mg by mouth at bedtime.   Yes [provider]  sertraline (ZOLOFT) 100 MG tablet TAKE 1 TABLET BY MOUTH DAILY 11/23/22  Yes Arfeen, Phillips Grout, MD  Shark Cartilage 740 MG CAPS Take 1 capsule by mouth 3 (three) times daily.   Yes [provider]  simvastatin (ZOCOR) 20 MG tablet TAKE 1 TABLET BY MOUTH EVERYDAY AT BEDTIME 09/29/22  Yes Shade Flood, MD  TECHLITE PLUS PEN NEEDLES 32G X 4 MM MISC USE ONCE DAILY WITH LANTUS. DX E11.9, Z79.4 12/07/22  Yes Shade Flood, MD  tirzepatide [facility]) 5 MG/0.5ML Pen Inject 5 mg into the skin once a week. 01/31/23  Yes Shade Flood, MD  traMADol (ULTRAM) 50 MG tablet Take 50 mg by mouth daily as needed for severe pain.   Yes [provider]  traZODone (DESYREL) 100 MG tablet Take 1 tablet (100 mg total) by mouth at bedtime as needed for sleep. 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  Tobacco Use   Smoking status: Former    Current packs/day: 0.00    Average packs/day: 2.0 packs/day for 26.0 years (52.0 ttl pk-yrs)    Types: Cigarettes    Start date: 08/12/1977    Quit date: 08/13/2003    Years since quitting: 19.6   Smokeless tobacco: Never   Tobacco comments:    2ppd x 26 years  Vaping Use   Vaping status: Never Used  Substance and Sexual Activity   Alcohol use: No    Alcohol/week: 0.0 standard drinks of alcohol    Comment: social   Drug use: No   Sexual activity: Yes    Partners: Male    Birth control/protection: None  Other Topics Concern   Not on file  Social History Narrative   Married. Education: college. Exercise: No.   Social Determinants of Health   Financial Resource Strain: Low Risk  (12/26/2022)   Overall Financial Resource Strain (CARDIA)    Difficulty of Paying Living Expenses: Not hard at all  Food Insecurity: No Food Insecurity (12/26/2022)   Hunger Vital Sign    Worried About Running Out of Food in the Last Year: Never true    Ran Out of Food in the Last Year: Never true  Transportation Needs: No Transportation Needs (12/26/2022)   PRAPARE - Administrator, Civil Service (Medical): No    Lack of Transportation (Non-Medical): No  Physical Activity: Unknown (12/26/2022)   Exercise Vital Sign    Days of Exercise per Week: 0 days    Minutes of Exercise per Session: Not on file  Stress: Stress Concern Present (12/26/2022)   [employer] of Occupational Health - Occupational Stress Questionnaire    Feeling of Stress : To some extent  Social Connections: Moderately Isolated (12/26/2022)   Social Connection and Isolation Panel [NHANES]    Frequency of  Communication with Friends and Family: More than three  times a week    Frequency of Social Gatherings with Friends and Family: Never    Attends Religious Services: Never    Database administrator or Organizations: No    Attends Engineer, structural: Not on file    Marital Status: Married  Catering manager Violence: Not on file    Review of Systems  Constitutional:  Negative for fatigue and unexpected weight change.  Eyes:  Negative for visual disturbance.  Respiratory:  Negative for cough, chest tightness and shortness of breath.   Cardiovascular:  Negative for chest pain, palpitations and leg swelling.  Gastrointestinal:  Negative for abdominal pain and blood in stool.  Neurological:  Negative for dizziness, light-headedness and headaches.     Objective:   Vitals:   04/20/23 1603  BP: 132/74  Pulse: 82  Temp: 97.8 F (36.6 C)  SpO2: 94%  Weight: (!) 345 lb (156.5 kg)     Physical Exam Vitals reviewed.  Constitutional:      Appearance: He is well-developed.  HENT:     Head: Normocephalic and atraumatic.  Neck:     Vascular: No carotid bruit or JVD.  Cardiovascular:     Rate and Rhythm: Normal rate and regular rhythm.     Heart sounds: Normal heart sounds. No murmur heard. Pulmonary:     Effort: Pulmonary effort is normal.     Breath sounds: Normal breath sounds. No rales.  Musculoskeletal:     Right lower leg: No edema.     Left lower leg: No edema.  Skin:    General: Skin is warm and dry.  Neurological:     Mental Status: He is alert and oriented to person, place, and time.  Psychiatric:        Mood and Affect: Mood normal.        Assessment & Plan:  Matthew Hutchinson is a 62 y.o. male . Type 2 diabetes mellitus with hyperglycemia, with long-term current use of insulin (HCC) - Plan: Hemoglobin A1c, metFORMIN (GLUCOPHAGE) 500 MG tablet, tirzepatide (MOUNJARO) 5 MG/0.5ML Pen  Essential hypertension - Plan: Comprehensive metabolic panel,  amLODipine (NORVASC) 5 MG tablet, losartan-hydrochlorothiazide (HYZAAR) 100-25 MG tablet  Hyperlipidemia, unspecified hyperlipidemia type - Plan: Lipid panel  Flu vaccine need - Plan: Flu vaccine trivalent PF, 6mos and older(Flulaval,Afluria,Fluarix,Fluzone)  Tolerating current med regimen for diabetes, hypertension, hyperlipidemia, hypertension overall stable.  Denies symptomatic lows with diabetes but we did discuss again concerns with 1 large meal per day.  Continue snacks throughout the day to minimize risk of hypoglycemia with RTC precautions.  Check A1c and adjust plan accordingly, no med changes at this time.  Continue statin for hyperlipidemia with labs pending as above, adjust plan accordingly.  Flu vaccine given.  Meds ordered this encounter  Medications   amLODipine (NORVASC) 5 MG tablet    Sig: Take 1 tablet (5 mg total) by mouth daily.    Dispense:  90 tablet    Refill:  1   losartan-hydrochlorothiazide (HYZAAR) 100-25 MG tablet    Sig: Take 1 tablet by mouth daily.    Dispense:  90 tablet    Refill:  1   metFORMIN (GLUCOPHAGE) 500 MG tablet    Sig: Take 1 tablet (500 mg total) by mouth 2 (two) times daily with a meal.    Dispense:  180 tablet    Refill:  1   tirzepatide (MOUNJARO) 5 MG/0.5ML Pen    Sig: Inject 5 mg into the skin once a week.  Dispense:  2 mL    Refill:  2   Patient Instructions  Thanks for coming in today.  No med changes at this time.   If any concerns on A1c or other labs I will let you know.  Follow-up in 3 months but if any changes in the meantime let me know.  Take care!    Signed,   [name], MD [facility], [facility] 04/20/23 4:22 PM

## 2023-04-21 LAB — COMPREHENSIVE METABOLIC PANEL
ALT: 28 U/L (ref 0–53)
AST: 28 U/L (ref 0–37)
Albumin: 4.6 g/dL (ref 3.5–5.2)
Alkaline Phosphatase: 71 U/L (ref 39–117)
BUN: 14 mg/dL (ref 6–23)
CO2: 25 meq/L (ref 19–32)
Calcium: 9.6 mg/dL (ref 8.4–10.5)
Chloride: 103 meq/L (ref 96–112)
Creatinine, Ser: 0.96 mg/dL (ref 0.40–1.50)
GFR: 84.91 mL/min (ref >60.00)
Glucose, Bld: 100 mg/dL — ABNORMAL HIGH (ref 70–99)
Potassium: 4.2 meq/L (ref 3.5–5.1)
Sodium: 136 meq/L (ref 135–145)
Total Bilirubin: 0.7 mg/dL (ref 0.2–1.2)
Total Protein: 7.1 g/dL (ref 6.0–8.3)

## 2023-04-21 LAB — LIPID PANEL
Cholesterol: 132 mg/dL (ref 0–200)
HDL: 42.3 mg/dL (ref >39.00)
LDL Cholesterol: 67 mg/dL (ref 0–99)
NonHDL: 89.54
Total CHOL/HDL Ratio: 3
Triglycerides: 112 mg/dL (ref 0.0–149.0)
VLDL: 22.4 mg/dL (ref 0.0–40.0)

## 2023-04-21 LAB — HEMOGLOBIN A1C: Hgb A1c MFr Bld: 6.6 % — ABNORMAL HIGH (ref 4.6–6.5)

## 2023-04-25 DIAGNOSIS — G4733 Obstructive sleep apnea (adult) (pediatric): Secondary | ICD-10-CM | POA: Diagnosis not present

## 2023-04-27 ENCOUNTER — Encounter: Payer: Self-pay | Admitting: Family Medicine

## 2023-04-29 ENCOUNTER — Other Ambulatory Visit (HOSPITAL_COMMUNITY): Payer: Self-pay | Admitting: Psychiatry

## 2023-04-29 DIAGNOSIS — F330 Major depressive disorder, recurrent, mild: Secondary | ICD-10-CM

## 2023-05-03 ENCOUNTER — Telehealth (HOSPITAL_COMMUNITY): Payer: BC Managed Care – PPO | Admitting: Psychiatry

## 2023-05-09 ENCOUNTER — Encounter (HOSPITAL_COMMUNITY): Payer: Self-pay | Admitting: Psychiatry

## 2023-05-09 ENCOUNTER — Telehealth (HOSPITAL_COMMUNITY): Payer: BC Managed Care – PPO | Admitting: Psychiatry

## 2023-05-09 VITALS — Wt 345.0 lb

## 2023-05-09 DIAGNOSIS — F330 Major depressive disorder, recurrent, mild: Secondary | ICD-10-CM | POA: Diagnosis not present

## 2023-05-09 DIAGNOSIS — F411 Generalized anxiety disorder: Secondary | ICD-10-CM | POA: Diagnosis not present

## 2023-05-09 MED ORDER — SERTRALINE HCL 100 MG PO TABS: ORAL_TABLET | ORAL | 0 refills | Status: DC

## 2023-05-09 MED ORDER — HYDROXYZINE PAMOATE 50 MG PO CAPS: ORAL_CAPSULE | ORAL | 0 refills | Status: DC

## 2023-05-09 MED ORDER — TRAZODONE HCL 100 MG PO TABS: 100.0000 mg | ORAL_TABLET | Freq: Every evening | ORAL | 0 refills | Status: DC | PRN

## 2023-05-09 MED ORDER — LAMOTRIGINE 200 MG PO TABS: 200.0000 mg | ORAL_TABLET | Freq: Every day | ORAL | 0 refills | Status: DC

## 2023-05-09 NOTE — Progress Notes (Signed)
[facility] MD Virtual Progress Note   Patient Location: Home Provider Location: Home Office  I connect with patient by telephone and verified that I am speaking with correct person by using two identifiers. I discussed the limitations of evaluation and management by telemedicine and the availability of in person appointments. I also discussed with the patient that there may be a patient responsible charge related to this service. The patient expressed understanding and agreed to proceed.  Matthew Hutchinson [id number] 62 y.o.  05/09/2023 4:23 PM  History of Present Illness:  Patient is evaluated by video session but Sheria Lang did not work properly.  He reported a lot of stress in past few months.  He apologized missing his last appointment.  He reported that his partner [name] has a lot of health issues.  He had diagnosed with diabetes and having a lot of fall.  He has to pick up few times and he is not thinking to moving to better place.  He also reported neighborhood is not very friendly and due to extreme political involvement he does not feel safe.  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  He sleeps on and off.  On the good note he recently had a blood work and his hemoglobin A1c improved.  He lost a few pounds.  He reported medicine is doing the job but he has no control on particle environment.  He is taking the Lamictal, trazodone, Zoloft and hydroxyzine.  He has no rash, itching tremors or shakes.  He denies any suicidal thoughts or homicidal thoughts.  He was seen Trula Ore but she had left and now he is not seeing any therapist.  He is wondering if he can consider seeing a therapist to help his coping skills.    Past Psychiatric History: H/O inpatient at [facility] for overdose on Ambien.  H/O overdose on Ambien 2 other times but did not require inpatient treatment.  H/O of mood  swing, impulsive behavior, speeding ticket, anger, road rage.  No history of psychosis or hallucination.   Outpatient Encounter Medications as of 05/09/2023  Medication Sig   amLODipine (NORVASC) 5 MG tablet Take 1 tablet (5 mg total) by mouth daily.   aspirin 81 MG tablet Take 1 tablet (81 mg total) by mouth daily.   b complex vitamins tablet Take 1 tablet by mouth daily.   calcium gluconate 500 MG tablet Take 500 mg by mouth daily.   Carboxymethylcellul-Glycerin (LUBRICATING EYE DROPS OP) Place 1 drop into both eyes daily as needed (dry eyes).   Cholecalciferol (VITAMIN D) 2000 UNITS tablet Take 1 tablet (2,000 Units total) by mouth daily.   Continuous Blood Gluc Sensor (FREESTYLE LIBRE 3 SENSOR) MISC Apply as instructed, change every 10 days.   Continuous Blood Gluc Sensor (FREESTYLE LIBRE 3 SENSOR) MISC Apply as instructed, change every 10 days.   diclofenac (VOLTAREN) 75 MG EC tablet Take 1 tablet by mouth 2 (two) times daily with a meal.   gabapentin (NEURONTIN) 300 MG capsule TAKE 2 CAPSULES BY MOUTH 2 TIMES DAILY.   GARLIC PO Take 606 mg by mouth daily.   hydrOXYzine (VISTARIL) 50 MG capsule TAKE 2 CAPSULES BY MOUTH AT BEDTIME   insulin glargine-yfgn (SEMGLEE, YFGN,) 100 UNIT/ML Pen INJECT 61 UNITS INTO THE SKIN DAILY   lamoTRIgine (LAMICTAL) 200 MG tablet Take 1 tablet (200 mg total) by mouth daily.   losartan-hydrochlorothiazide (HYZAAR) 100-25  MG tablet Take 1 tablet by mouth daily.   Lutein 40 MG CAPS Take 40 mg by mouth at bedtime.   magnesium gluconate (MAGONATE) 500 MG tablet Take 500 mg by mouth daily.   metFORMIN (GLUCOPHAGE) 500 MG tablet Take 1 tablet (500 mg total) by mouth 2 (two) times daily with a meal.   Multiple Vitamin (MULTIVITAMIN) capsule Take 1 capsule by mouth daily.   nitroGLYCERIN (NITROSTAT) 0.4 MG SL tablet Place 1 tablet (0.4 mg total) under the tongue every 5 (five) minutes as needed for chest pain. If chest pain not resolved, after 3 doses 5 minutes  apart--call 911 (Patient taking differently: Place 0.4 mg under the tongue See admin instructions. Take 0.4 mg as needed for hiccups)   Potassium 99 MG TABS Take 99 mg by mouth at bedtime.   sertraline (ZOLOFT) 100 MG tablet TAKE 1 TABLET BY MOUTH DAILY   Shark Cartilage 740 MG CAPS Take 1 capsule by mouth 3 (three) times daily.   simvastatin (ZOCOR) 20 MG tablet TAKE 1 TABLET BY MOUTH EVERYDAY AT BEDTIME   TECHLITE PLUS PEN NEEDLES 32G X 4 MM MISC USE ONCE DAILY WITH LANTUS. DX E11.9, Z79.4   tirzepatide (MOUNJARO) 5 MG/0.5ML Pen Inject 5 mg into the skin once a week.   traMADol (ULTRAM) 50 MG tablet Take 50 mg by mouth daily as needed for severe pain.   traZODone (DESYREL) 100 MG tablet Take 1 tablet (100 mg total) by mouth at bedtime as needed for sleep.   No facility-administered encounter medications on file as of 05/09/2023.    Recent Results (from the past 2160 hour(s))  Comprehensive metabolic panel     Status: Abnormal   Collection Time: 04/20/23  4:38 PM  Result Value Ref Range   Sodium 136 135 - 145 mEq/L   Potassium 4.2 3.5 - 5.1 mEq/L   Chloride 103 96 - 112 mEq/L   CO2 25 19 - 32 mEq/L   Glucose, Bld 100 (H) 70 - 99 mg/dL   BUN 14 6 - 23 mg/dL   Creatinine, Ser 4.09 0.40 - 1.50 mg/dL   Total Bilirubin 0.7 0.2 - 1.2 mg/dL   Alkaline Phosphatase 71 39 - 117 U/L   AST 28 0 - 37 U/L   ALT 28 0 - 53 U/L   Total Protein 7.1 6.0 - 8.3 g/dL   Albumin 4.6 3.5 - 5.2 g/dL   GFR 81.19 >14.78 mL/min    Comment: Calculated using the CKD-EPI Creatinine Equation (2021)   Calcium 9.6 8.4 - 10.5 mg/dL  Lipid panel     Status: None   Collection Time: 04/20/23  4:38 PM  Result Value Ref Range   Cholesterol 132 0 - 200 mg/dL    Comment: ATP III Classification       Desirable:  < 200 mg/dL               Borderline High:  200 - 239 mg/dL          High:  > = 295 mg/dL   Triglycerides 621.3 0.0 - 149.0 mg/dL    Comment: Normal:  <086 mg/dLBorderline High:  150 - 199 mg/dL   HDL 57.84  >69.62 mg/dL   VLDL 95.2 0.0 - 84.1 mg/dL   LDL Cholesterol 67 0 - 99 mg/dL   Total CHOL/HDL Ratio 3     Comment:                Men  Women1/2 Average Risk     3.4          3.3Average Risk          5.0          4.42X Average Risk          9.6          7.13X Average Risk          15.0          11.0                       NonHDL 89.54     Comment: NOTE:  Non-HDL goal should be 30 mg/dL higher than patient's LDL goal (i.e. LDL goal of < 70 mg/dL, would have non-HDL goal of < 100 mg/dL)  Hemoglobin B2W     Status: Abnormal   Collection Time: 04/20/23  4:38 PM  Result Value Ref Range   Hgb A1c MFr Bld 6.6 (H) 4.6 - 6.5 %    Comment: Glycemic Control Guidelines for People with Diabetes:Non Diabetic:  <6%Goal of Therapy: <7%Additional Action Suggested:  >8%      Psychiatric Specialty Exam: Physical Exam  Review of Systems  Weight (!) 345 lb (156.5 kg).There is no height or weight on file to calculate BMI.  General Appearance: NA  Eye Contact:  NA  Speech:  Normal Rate  Volume:  Normal  Mood:  Anxious and Dysphoric  Affect:  Congruent  Thought Process:  Goal Directed  Orientation:  Full (Time, Place, and Person)  Thought Content:  Rumination  Suicidal Thoughts:  No  Homicidal Thoughts:  No  Memory:  Immediate;   Good Recent;   Good Remote;   Good  Judgement:  Intact  Insight:  Present  Psychomotor Activity:  Decreased  Concentration:  Concentration: Good and Attention Span: Good  Recall:  Good  Fund of Knowledge:  Good  Language:  Good  Akathisia:  No  Handed:  Right  AIMS (if indicated):     Assets:  Communication Skills Desire for Improvement Housing Talents/Skills Transportation  ADL's:  Intact  Cognition:  WNL  Sleep:  use CPAP     Assessment/Plan: Major depressive disorder, recurrent episode, mild (HCC) - Plan: sertraline (ZOLOFT) 100 MG tablet, traZODone (DESYREL) 100 MG tablet, lamoTRIgine (LAMICTAL) 200 MG tablet, hydrOXYzine (VISTARIL) 50 MG  capsule  GAD (generalized anxiety disorder) - Plan: sertraline (ZOLOFT) 100 MG tablet  Discussed psychosocial stressors.  Patient concerned about protocol and environment and sometimes he does not feel safe as being gay and living with his partner.  His partner also have a lot of health issues.  I reviewed blood work results his blood sugar is better.  He lost few pounds.  Patient is thinking about moving to a safe place but does not have any thoughts of hurting himself or someone else.  We talk about seeing a therapist and patient agreed.  He was seen [place] who had left the practice.  We decided not to change the medication since it is working.  We will continue Zoloft 100 mg daily, trazodone 100 mg at bedtime, Lamictal 200 mg daily and hydroxyzine 50 mg twice a day.  He has no rash, itching, tremors or shakes.  We will refer him for therapy.  Follow-up in 3 months unless he requested an earlier appointment.   Follow Up Instructions:     I discussed the assessment and treatment plan with the patient. The patient was provided  an opportunity to ask questions and all were answered. The patient agreed with the plan and demonstrated an understanding of the instructions.   The patient was advised to call back or seek an in-person evaluation if the symptoms worsen or if the condition fails to improve as anticipated.    Collaboration of Care: Other provider involved in patient's care AEB notes are available in epic to review  Patient/Guardian was advised Release of Information must be obtained prior to any record release in order to collaborate their care with an outside provider. Patient/Guardian was advised if they have not already done so to contact the registration department to sign all necessary forms in order for [place] to release information regarding their care.   Consent: Patient/Guardian gives verbal consent for treatment and assignment of benefits for services provided during this visit.  Patient/Guardian expressed understanding and agreed to proceed.     I provided 20 minutes of non face to face time during this encounter.  Note: This document was prepared by [employer] voice dictation technology and any errors that results from this process are unintentional.    Cleotis Nipper, MD 05/09/2023

## 2023-05-10 ENCOUNTER — Encounter: Payer: Self-pay | Admitting: Family Medicine

## 2023-06-01 ENCOUNTER — Encounter: Payer: Self-pay | Admitting: Podiatry

## 2023-06-01 ENCOUNTER — Ambulatory Visit (INDEPENDENT_AMBULATORY_CARE_PROVIDER_SITE_OTHER): Payer: BC Managed Care – PPO | Admitting: Podiatry

## 2023-06-01 DIAGNOSIS — B351 Tinea unguium: Secondary | ICD-10-CM

## 2023-06-01 DIAGNOSIS — M79674 Pain in right toe(s): Secondary | ICD-10-CM | POA: Diagnosis not present

## 2023-06-01 DIAGNOSIS — M79675 Pain in left toe(s): Secondary | ICD-10-CM | POA: Diagnosis not present

## 2023-06-01 NOTE — Progress Notes (Signed)
This patient returns to my office for at risk foot care.  This patient requires this care by a professional since this patient will be at risk due to having diabetes. This patient says his nails are painful in his 2,3 toes both feet.  He says the nails curve over the tip of his toes during walking. This patient presents for at risk foot care today.  General Appearance  Alert, conversant and in no acute stress.  Vascular  Dorsalis pedis and posterior tibial  pulses are palpable  bilaterally.  Capillary return is within normal limits  bilaterally. Temperature is within normal limits  bilaterally.  Neurologic  Senn-Weinstein monofilament wire test within normal limits  bilaterally. Muscle power within normal limits bilaterally.  Nails Thick disfigured discolored nails with subungual debris  from hallux to fifth toes bilaterally. No evidence of bacterial infection or drainage bilaterally.  Orthopedic  No limitations of motion  feet .  No crepitus or effusions noted.  No bony pathology or digital deformities noted. Hammer toes  2-4  B/L.  Skin  normotropic skin with no porokeratosis noted bilaterally.  No signs of infections or ulcers noted.     Hammer toes 2-4  B/L.    Consent was obtained for treatment procedures.   Debride nails with nail nipper followed by dremel tool.  RTC 9 weeks.     Return office visit    9 weeks                  Told patient to return for periodic foot care and evaluation due to potential at risk complications.   Helane Gunther DPM

## 2023-06-08 ENCOUNTER — Ambulatory Visit (INDEPENDENT_AMBULATORY_CARE_PROVIDER_SITE_OTHER): Payer: BC Managed Care – PPO | Admitting: Licensed Clinical Social Worker

## 2023-06-08 ENCOUNTER — Encounter (HOSPITAL_COMMUNITY): Payer: Self-pay

## 2023-06-08 DIAGNOSIS — F330 Major depressive disorder, recurrent, mild: Secondary | ICD-10-CM | POA: Diagnosis not present

## 2023-06-08 DIAGNOSIS — F411 Generalized anxiety disorder: Secondary | ICD-10-CM | POA: Diagnosis not present
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
## 2023-06-13 ENCOUNTER — Other Ambulatory Visit: Payer: Self-pay | Admitting: Family Medicine

## 2023-06-13 DIAGNOSIS — G8929 Other chronic pain: Secondary | ICD-10-CM

## 2023-06-29 ENCOUNTER — Other Ambulatory Visit: Payer: Self-pay | Admitting: Family Medicine

## 2023-06-29 DIAGNOSIS — E1165 Type 2 diabetes mellitus with hyperglycemia: Secondary | ICD-10-CM

## 2023-07-21 ENCOUNTER — Ambulatory Visit: Payer: BC Managed Care – PPO | Admitting: Family Medicine

## 2023-07-21 ENCOUNTER — Encounter: Payer: Self-pay | Admitting: Family Medicine

## 2023-07-21 VITALS — BP 130/76 | HR 71 | Temp 98.0°F | Ht 74.0 in | Wt 325.0 lb

## 2023-07-21 DIAGNOSIS — F339 Major depressive disorder, recurrent, unspecified: Secondary | ICD-10-CM

## 2023-07-21 DIAGNOSIS — E1165 Type 2 diabetes mellitus with hyperglycemia: Secondary | ICD-10-CM | POA: Diagnosis not present

## 2023-07-21 DIAGNOSIS — E162 Hypoglycemia, unspecified: Secondary | ICD-10-CM

## 2023-07-21 DIAGNOSIS — F439 Reaction to severe stress, unspecified: Secondary | ICD-10-CM | POA: Diagnosis not present

## 2023-07-21 DIAGNOSIS — Z794 Long term (current) use of insulin: Secondary | ICD-10-CM

## 2023-07-21 MED ORDER — GVOKE HYPOPEN 1-PACK 1 MG/0.2ML ~~LOC~~ SOAJ: 1.0000 mg | Freq: Once | SUBCUTANEOUS | 1 refills | Status: DC | PRN

## 2023-07-21 NOTE — Progress Notes (Signed)
 Subjective:  Patient ID: Matthew Hutchinson, male    DOB: 1961-01-24  Age: 63 y.o. MRN: [id number]  CC:  Chief Complaint  Patient presents with   Medical Management of Chronic Issues    Pt notes has been very stressed husband has a broken ankle that has made him into care giver.      HPI Matthew Hutchinson presents for   Diabetes: With hyperglycemia, obesity.  Previously on Trulicity , changed to Mounjaro .  He is on ARB with losartan  HCTZ combo, and statin with simvastatin .  A1c improved at 6.6 on the 5 mg Mounjaro  in October.  On gabapentin  for chronic neuropathic leg pain. Semglee  61 units/day.  Attempts at regular meals discussed previously including snacks to minimize risk for hypoglycemia. Has adjusted doses of meds.  Taking mounjaro  5mg  weekly Metformin  500mg  BID Semglee  insulin  - has self adjusted insulin  dose a month ago. Was having low readings in the 50's - every other night. Has not called us  with any of these low readings or change in plan.  Nightly under 70's past 3 weeks.  No syncope, 911 calls or ER visits.  Has adjusted his semglee  to 34-42 units past few weeks - basing on number of drops the night before.  Highest readings 69 here today. Feels ok.  Lowest reading of 42 in December.dizzy, blurry vision, nausea, no syncope.  Will eat candy when low - 4 last night.  Still eating 1 meal per day. Less snacks during day - loses track of time.  Uses CGM.   Home readings fasting:  Postprandial Symptomatic lows  Weight has improved 20 pounds since his last visit in October. Microalbumin: 3.7 on 06/30/2022. Optho, foot exam, pneumovax:  Ophthalmology: Foot exam:   Wt Readings from Last 3 Encounters:  07/21/23 (!) 325 lb (147.4 kg)  04/20/23 (!) 345 lb (156.5 kg)  04/07/23 (!) 350 lb (158.8 kg)    Lab Results  Component Value Date   HGBA1C 6.6 (H) 04/20/2023   HGBA1C 7.1 (H) 09/29/2022   HGBA1C 7.1 (H) 06/30/2022   Lab Results  Component Value Date    MICROALBUR 3.7 (H) 06/30/2022   LDLCALC 67 04/20/2023   CREATININE 0.96 04/20/2023    Situational stress/anxiety As above.  Has needed to be caregiver for his husband with a broken ankle since November.  Has been dealing with this as best as he can.  He is followed by psychiatry, Dr. Arfeen for depression and anxiety, as well as counseling with [facility], last appointment in November.  Has appt with [facility] and Dr. Arfeen in 2 weeks. Denies need to speak to them sooner or needs form me at this time.       07/21/2023    3:41 PM 06/08/2023    1:26 PM 01/31/2023    4:15 PM 12/30/2022    3:41 PM  GAD 7 : Generalized Anxiety Score  Nervous, Anxious, on Edge 1  1 1   Control/stop worrying 3  2 3   Worry too much - different things 2  2 3   Trouble relaxing 3  0 0  Restless 1  0 0  Easily annoyed or irritable 2  1 2   Afraid - awful might happen 3  1 3   Total GAD 7 Score 15  7 12   Anxiety Difficulty         Information is confidential and restricted. Go to Review Flowsheets to unlock data.      07/21/2023    3:41 PM 06/08/2023  1:29 PM 01/31/2023    4:15 PM 12/30/2022    3:40 PM 09/29/2022    2:12 PM  Depression screen PHQ 2/9  Decreased Interest 1  1 1 1   Down, Depressed, Hopeless 2  1 1 1   PHQ - 2 Score 3  2 2 2   Altered sleeping 1  1 2  0  Tired, decreased energy 1  1 2  0  Change in appetite 0  2 1 0  Feeling bad or failure about yourself  1  0 0 0  Trouble concentrating 0  0 1 0  Moving slowly or fidgety/restless 1  0 1 0  Suicidal thoughts 0  0 0 0  PHQ-9 Score 7  6 9 2   Difficult doing work/chores          Information is confidential and restricted. Go to Review Flowsheets to unlock data.        History Patient Active Problem List   Diagnosis Date Noted   Major depressive disorder, recurrent episode, mild (HCC) 06/08/2023   GAD (generalized anxiety disorder) 06/08/2023   Pain due to onychomycosis of toenails of both feet 06/01/2023   Hammer toes of both feet  03/30/2023   Chronic pain of left lower extremity 09/30/2022   Type 2 diabetes mellitus with hyperglycemia, with long-term current use of insulin  (HCC) 09/30/2022   Hiccups 04/01/2022   Newly diagnosed diabetes (HCC) 07/27/2018   Morbid obesity (HCC) 02/02/2017   Chronic pain due to trauma 06/25/2013   Depression, recurrent (HCC) 11/09/2012   CAD (coronary artery disease) 02/21/2012   Lipid disorder 02/21/2012   Angina pectoris (HCC) 09/27/2011   Obstructive sleep apnea 05/20/2010   Essential hypertension 04/17/2010   COPD mixed type (HCC) 04/17/2010   WEIGHT GAIN, ABNORMAL 04/17/2010   Past Medical History:  Diagnosis Date   Allergy    Anxiety    Arthritis    COPD (chronic obstructive pulmonary disease) (HCC)    Depression    Emphysema of lung (HCC)    Hyperlipidemia    Morbid obesity (HCC)    OSA (obstructive sleep apnea)    uses CPAP nightly   PONV (postoperative nausea and vomiting)    Unspecified essential hypertension    Past Surgical History:  Procedure Laterality Date   ESOPHAGOGASTRODUODENOSCOPY (EGD) WITH PROPOFOL  N/A 10/16/2021   Procedure: ESOPHAGOGASTRODUODENOSCOPY (EGD) WITH PROPOFOL ;  Surgeon: Rollin Dover, MD;  Location: [facility];  Service: Endoscopy;  Laterality: N/A;   INSERTION OF MESH N/A 09/18/2015   Procedure: INSERTION OF MESH;  Surgeon: Vicenta Poli, MD;  Location: [facility];  Service: General;  Laterality: N/A;   LEG SURGERY Left    SAVORY DILATION N/A 10/16/2021   Procedure: SAVORY DILATION;  Surgeon: Rollin Dover, MD;  Location: [facility];  Service: Endoscopy;  Laterality: N/A;   SHOULDER ARTHROSCOPY Left    TONSILLECTOMY     UMBILICAL HERNIA REPAIR N/A 09/18/2015   Procedure: HERNIA REPAIR UMBILICAL ADULT WITH MESH ;  Surgeon: Vicenta Poli, MD;  Location: [facility];  Service: General;  Laterality: N/A;   Allergies  Allergen Reactions   Penicillins Anaphylaxis   Valsartan Itching and Swelling   Prior  to Admission medications   Medication Sig Start Date End Date Taking? Authorizing Provider  amLODipine  (NORVASC ) 5 MG tablet Take 1 tablet (5 mg total) by mouth daily. 04/20/23  Yes Levora Reyes SAUNDERS, MD  aspirin  81 MG tablet Take 1 tablet (81 mg total) by mouth daily. 11/13/12  Yes Lord, Jamison  Y, NP  b complex vitamins tablet Take 1 tablet by mouth daily.   Yes [provider]  calcium  gluconate 500 MG tablet Take 500 mg by mouth daily.   Yes [provider]  Carboxymethylcellul-Glycerin (LUBRICATING EYE DROPS OP) Place 1 drop into both eyes daily as needed (dry eyes).   Yes [provider]  Cholecalciferol  (VITAMIN D ) 2000 UNITS tablet Take 1 tablet (2,000 Units total) by mouth daily. 11/13/12  Yes Jacquetta Sharlot GRADE, NP  Continuous Blood Gluc Sensor (FREESTYLE LIBRE 3 SENSOR) MISC Apply as instructed, change every 10 days. 10/15/22  Yes Levora Reyes SAUNDERS, MD  Continuous Blood Gluc Sensor (FREESTYLE LIBRE 3 SENSOR) MISC Apply as instructed, change every 10 days. 10/15/22  Yes Levora Reyes SAUNDERS, MD  diclofenac (VOLTAREN) 75 MG EC tablet Take 1 tablet by mouth 2 (two) times daily with a meal. 12/20/22  Yes [provider]  gabapentin  (NEURONTIN ) 300 MG capsule TAKE 2 CAPSULES BY MOUTH 2 TIMES DAILY. 06/14/23  Yes Levora Reyes SAUNDERS, MD  GARLIC PO Take 100 mg by mouth daily.   Yes [provider]  hydrOXYzine  (VISTARIL ) 50 MG capsule TAKE 2 CAPSULES BY MOUTH AT BEDTIME 05/09/23  Yes Arfeen, Leni DASEN, MD  insulin  glargine-yfgn (SEMGLEE , YFGN,) 100 UNIT/ML Pen INJECT 61 UNITS INTO THE SKIN DAILY 06/29/23  Yes Levora Reyes SAUNDERS, MD  lamoTRIgine  (LAMICTAL ) 200 MG tablet Take 1 tablet (200 mg total) by mouth daily. 05/09/23  Yes Arfeen, Leni DASEN, MD  losartan -hydrochlorothiazide  (HYZAAR) 100-25 MG tablet Take 1 tablet by mouth daily. 04/20/23  Yes Levora Reyes SAUNDERS, MD  Lutein  40 MG CAPS Take 40 mg by mouth at bedtime.   Yes [provider]  magnesium  gluconate  (MAGONATE) 500 MG tablet Take 500 mg by mouth daily.   Yes [provider]  metFORMIN  (GLUCOPHAGE ) 500 MG tablet Take 1 tablet (500 mg total) by mouth 2 (two) times daily with a meal. 04/20/23  Yes Levora Reyes SAUNDERS, MD  Multiple Vitamin (MULTIVITAMIN) capsule Take 1 capsule by mouth daily. 11/13/12  Yes Lord, Sharlot GRADE, NP  nitroGLYCERIN  (NITROSTAT ) 0.4 MG SL tablet Place 1 tablet (0.4 mg total) under the tongue every 5 (five) minutes as needed for chest pain. If chest pain not resolved, after 3 doses 5 minutes apart--call 911 Patient taking differently: Place 0.4 mg under the tongue See admin instructions. Take 0.4 mg as needed for hiccups 11/13/12  Yes Jacquetta Sharlot GRADE, NP  Potassium 99 MG TABS Take 99 mg by mouth at bedtime.   Yes [provider]  sertraline  (ZOLOFT ) 100 MG tablet TAKE 1 TABLET BY MOUTH DAILY 05/09/23  Yes Arfeen, Leni DASEN, MD  Shark Cartilage 740 MG CAPS Take 1 capsule by mouth 3 (three) times daily.   Yes [provider]  simvastatin  (ZOCOR ) 20 MG tablet TAKE 1 TABLET BY MOUTH EVERYDAY AT BEDTIME 09/29/22  Yes Levora Reyes SAUNDERS, MD  TECHLITE PLUS PEN NEEDLES 32G X 4 MM MISC USE ONCE DAILY WITH LANTUS . DX E11.9, Z79.4 12/07/22  Yes Levora Reyes SAUNDERS, MD  tirzepatide  (MOUNJARO ) 5 MG/0.5ML Pen Inject 5 mg into the skin once a week. 04/20/23  Yes Levora Reyes SAUNDERS, MD  traMADol  (ULTRAM ) 50 MG tablet Take 50 mg by mouth daily as needed for severe pain.   Yes [provider]  traZODone  (DESYREL ) 100 MG tablet Take 1 tablet (100 mg total) by mouth at bedtime as needed for sleep. 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  Tobacco Use   Smoking status: Former    Current packs/day: 0.00    Average  packs/day: 2.0 packs/day for 26.0 years (52.0 ttl pk-yrs)    Types: Cigarettes    Start date: 08/12/1977    Quit date: 08/13/2003    Years since quitting: 19.9   Smokeless tobacco: Never   Tobacco comments:    2ppd x 26 years  Vaping Use   Vaping status: Never Used  Substance and Sexual Activity   Alcohol  use: No    Alcohol /week: 0.0 standard drinks of alcohol     Comment: social   Drug use: No   Sexual activity: Yes    Partners: Male    Birth control/protection: None  Other Topics Concern   Not on file  Social History Narrative   Married. Education: college. Exercise: No.   Social Drivers of Health   Financial Resource Strain: Low Risk  (07/18/2023)   Overall Financial Resource Strain (CARDIA)    Difficulty of Paying Living Expenses: Not hard at all  Food Insecurity: No Food Insecurity (07/18/2023)   Hunger Vital Sign    Worried About Running Out of Food in the Last Year: Never true    Ran Out of Food in the Last Year: Never true  Transportation Needs: No Transportation Needs (07/18/2023)   PRAPARE - Administrator, Civil Service (Medical): No    Lack of Transportation (Non-Medical): No  Physical Activity: Sufficiently Active (07/18/2023)   Exercise Vital Sign    Days of Exercise per Week: 7 days    Minutes of Exercise per Session: 60 min  Stress: Stress Concern Present (07/18/2023)   [employer] of Occupational Health - Occupational Stress Questionnaire    Feeling of Stress : To some extent  Social Connections: Moderately Isolated (07/18/2023)   Social Connection and Isolation Panel [NHANES]    Frequency of Communication with Friends and Family: More than three times a week    Frequency of Social Gatherings with Friends and Family: Never    Attends Religious Services: Never    Database Administrator or Organizations: No    Attends Engineer, Structural: Not on file    Marital Status: Married  Catering Manager Violence: Not on file    Review of  Systems Per HPI.   Objective:   Vitals:   07/21/23 1542  BP: 130/76  Pulse: 71  Temp: 98 F (36.7 C)  TempSrc: Temporal  SpO2: 97%  Weight: (!) 325 lb (147.4 kg)  Height: 6' 2 (1.88 m)     Physical Exam Vitals reviewed.  Constitutional:      General: He is not in acute distress.    Appearance: Normal appearance. He is well-developed and normal weight. He is not ill-appearing or diaphoretic.  HENT:     Head: Normocephalic and atraumatic.  Neck:     Vascular: No carotid bruit or JVD.  Cardiovascular:     Rate and Rhythm: Normal rate and regular rhythm.     Heart sounds: Normal heart sounds. No murmur heard. Pulmonary:     Effort: Pulmonary effort is normal.     Breath sounds: Normal breath sounds. No rales.  Musculoskeletal:  Right lower leg: No edema.     Left lower leg: No edema.  Skin:    General: Skin is warm and dry.  Neurological:     Mental Status: He is alert and oriented to person, place, and time.  Psychiatric:        Mood and Affect: Mood normal.        In office glucose 73 today - denies any symptoms.   5:10 PM has reviewing his after visit summary and conclusion of visit, his phone alarm went off with continuous glucose meter reading 54.  Denies any symptoms, single glucose tablet given.  Point-of-care glucose in office 73.   On further discussion, he notes that he has skipped a few days of insulin  and still has had readings in the 73 range - higher with coffee.  He took 42 units of insulin  this morning.  Over 50 minutes spent during visit, including chart review, counseling and assimilation of information, exam, repeat assessments with hypoglycemia, discussion of plan, and chart completion.    Assessment & Plan:  QUINT CHESTNUT is a 63 y.o. male . Type 2 diabetes mellitus with hyperglycemia, with long-term current use of insulin  (HCC) - Plan: Comprehensive metabolic panel, Lipid panel, Hemoglobin A1c Hypoglycemia - Plan: Hemoglobin  A1c, Glucagon  (GVOKE HYPOPEN  1-PACK) 1 MG/0.2ML SOAJ  -History of hyperglycemia, most recently hypoglycemia as above -likely in part due to weight loss as well as decreased meals, infrequent meals.  He has attempted to correct this on his own with self adjustments in his insulin  regimen.  Asymptomatic in the office with his home glucose meter reading in the 50s on his CGM, 70's on point-of-care glucose by fingerstick.  With his alarm reading of 53 I did give him 1 glucose tablet but glucose by fingerstick was still in the 70s.  He has been having multiple low blood sugar readings in the evening, but only reports 1 severe hypoglycemia episode 2 months ago. Unsure of why he would have consistent readings on or off insulin .  -For now we will try to decrease his insulin  to 20 units/day with close monitoring for hyper or persistent hypoglycemia with RTC and ER precautions given.  No other med changes at this time.  I did prescribe glucagon  pen for severe symptomatic hypoglycemia with 911 precautions.  2-week follow-up.  Depression, recurrent (HCC) Situational stress  -Situational stressors as above, has close follow-up with his counselor as well as psychiatry.  Denies SI, denies any needs from us  at this time.  Meds ordered this encounter  Medications   Glucagon  (GVOKE HYPOPEN  1-PACK) 1 MG/0.2ML SOAJ    Sig: Inject 1 mg into the skin once as needed for up to 1 dose.    Dispense:  0.2 mL    Refill:  1   Patient Instructions  Weight has improved and I agree that you do not need to be on as much medication. I am glad that you have decreased the insulin  but I am still concerned about the recurrent or low blood sugars.  As we have discussed part of that could be due to decreased meals, but on too much insulin  as well.  For now let's decrease the insulin  to 20 units for now.  Continue to monitor home readings and recheck in 2 weeks. If any further lows below 70 - I need to know.  If readings over 200, I need to  know.  Do your best with regular meals and snacks to minimize risk of low  blood sugar and see other information below. If your sugar is low and you have symptoms, I have prescribed the glucagon  rescue injection for any severe symptomatic low blood sugar.  Call 911, and be seen in the emergency room if that is needed.   Sorry to hear about the extra stressors at home.  Hang in there.   Thanks for coming today and please let me know if there are questions in the meantime.  Hypoglycemia Hypoglycemia is when the amount of sugar, or glucose, in your blood is too low. Low blood sugar can happen if you have diabetes or if you don't have diabetes. It may be an emergency. What are the causes? Low blood sugar happens most often in people who have diabetes. It may be caused by: Diabetes medicine. Not eating enough, or not eating often enough. Being more active than normal. If you don't have diabetes, you may still get low blood sugar if: There's a tumor in your pancreas. A tumor is a growth of cells that isn't normal. You don't eat enough, or you fast. Fasting is when you don't eat for long periods at a time. You have a bad infection or illness. You have problems after weight loss surgery. You have kidney or liver problems. You take certain medicines. What increases the risk? You're more likely to have low blood sugar if: You have diabetes and take medicine for it. You drink a lot of alcohol . You get sick. What are the signs or symptoms? Mild Hunger or feeling like you may vomit. Sweating and feeling cold to the touch. Feeling dizzy or light-headed. Being sleepy or having trouble sleeping. A headache. Blurry vision. Mood changes. These include feeling worried, nervous, or easily annoyed. Moderate Feeling confused. Changes in the way you act. Weakness. An uneven heartbeat. Very bad Having very low blood sugar is an emergency. It can cause: Fainting. Seizures. A coma. Death. How is  this diagnosed?  Low blood sugar can be found with a blood test. This test tells you how much sugar is in your blood. It's done while you're having symptoms. Your health care provider may also do an exam and look at your medical history. How is this treated? Treating low blood sugar If you have low blood sugar, eat or drink something with sugar in it right away. The food or drink should have 15 grams of a fast-acting carbohydrate (carb). Options include: 4 oz (120 mL) of fruit juice. 4 oz (120 mL) of soda (not diet soda). A few pieces of hard candy. Check food labels to see how many pieces to eat. 1 Tbsp (15 mL) of sugar or honey. 4 glucose tablets. 1 tube of glucose gel. Treating low blood sugar if you have diabetes Talk with your provider about how much carb you should take. If you're alert and can swallow safely, you may follow the 15:15 rule: Take 15 grams of a fast-acting carb. Check your blood sugar 15 minutes after you take the carb. If your blood sugar is still at or below 70 mg/dL (3.9 mmol/L), take 15 grams of a carb again. If your blood sugar doesn't go above 70 mg/dL (3.9 mmol/L) after 3 tries, get help right away. After your blood sugar goes back to normal, eat a meal or a snack within 1 hour. Always keep 15 grams of a fast-acting carb with you. This could be: 4 glucose tablets. A few pieces of hard candy. 1 Tbsp (15 mL) of honey or sugar. 1 tube of  glucose gel. Treating very low blood sugar If your blood sugar is less than 54 mg/dL (3 mmol/L), it's an emergency. Get help right away. If you can't eat or drink, you will need to be given glucagon . A family member or friend should learn how to check your blood sugar and give you glucagon . Ask your provider if you should keep a glucagon  kit at home. You may also need to be treated in a hospital. Follow these instructions at home: If you have diabetes: Always keep a fast-acting carb (15 grams) with you. Follow your diabetes  care plan. Make sure you: Know the symptoms of low blood sugar. Check your blood sugar as often as told. Always check it before and after you exercise. Always check your blood sugar before you drive. Take your medicines as told. Eat on time. Do not skip meals. Share your diabetes care plan with: Your work or school. The people you live with. Wear an alert bracelet or carry a card that says you have diabetes. General instructions If you drink alcohol : Limit how much you have to: 0-1 drink a day if you're male. 0-2 drinks a day if you're male. Know how much alcohol  is in your drink. In the [place] one drink is one 12 oz bottle of beer (355 mL), one 5 oz glass of wine (148 mL), or one 1 oz glass of hard liquor (44 mL). Be sure to eat food when you drink alcohol . Be sure to check your blood sugar after you drink. Alcohol  may lead to low blood sugar later. Where to find more information American Diabetes Association (ADA): diabetes.org Contact a health care provider if: You have low blood sugar often. You have diabetes and are having trouble keeping your blood sugar in the right range. Get help right away if: You can't get your blood sugar above 70 mg/dL (3.9 mmol/L) after 3 tries. Your blood sugar is below 54 mg/dL (3 mmol/L). You have a seizure. You faint. These symptoms may be an emergency. Call 911 right away. Do not wait to see if the symptoms will go away. Do not drive yourself to the hospital. This information is not intended to replace advice given to you by your health care provider. Make sure you discuss any questions you have with your health care provider. Document Revised: 09/15/2022 Document Reviewed: 09/15/2022 Elsevier Patient Education  2024 Elsevier Inc.     Signed,   Reyes Pines, MD [facility], [facility] 07/21/23 5:10 PM

## 2023-07-21 NOTE — Patient Instructions (Addendum)
 Weight has improved and I agree that you do not need to be on as much medication. I am glad that you have decreased the insulin  but I am still concerned about the recurrent or low blood sugars.  As we have discussed part of that could be due to decreased meals, but on too much insulin  as well.  For now let's decrease the insulin  to 20 units for now.  Continue to monitor home readings and recheck in 2 weeks. If any further lows below 70 - I need to know.  If readings over 200, I need to know.  Do your best with regular meals and snacks to minimize risk of low blood sugar and see other information below. If your sugar is low and you have symptoms, I have prescribed the glucagon  rescue injection for any severe symptomatic low blood sugar.  Call 911, and be seen in the emergency room if that is needed.   Sorry to hear about the extra stressors at home.  Hang in there.   Thanks for coming today and please let me know if there are questions in the meantime.  Hypoglycemia Hypoglycemia is when the amount of sugar, or glucose, in your blood is too low. Low blood sugar can happen if you have diabetes or if you don't have diabetes. It may be an emergency. What are the causes? Low blood sugar happens most often in people who have diabetes. It may be caused by: Diabetes medicine. Not eating enough, or not eating often enough. Being more active than normal. If you don't have diabetes, you may still get low blood sugar if: There's a tumor in your pancreas. A tumor is a growth of cells that isn't normal. You don't eat enough, or you fast. Fasting is when you don't eat for long periods at a time. You have a bad infection or illness. You have problems after weight loss surgery. You have kidney or liver problems. You take certain medicines. What increases the risk? You're more likely to have low blood sugar if: You have diabetes and take medicine for it. You drink a lot of alcohol . You get sick. What are the  signs or symptoms? Mild Hunger or feeling like you may vomit. Sweating and feeling cold to the touch. Feeling dizzy or light-headed. Being sleepy or having trouble sleeping. A headache. Blurry vision. Mood changes. These include feeling worried, nervous, or easily annoyed. Moderate Feeling confused. Changes in the way you act. Weakness. An uneven heartbeat. Very bad Having very low blood sugar is an emergency. It can cause: Fainting. Seizures. A coma. Death. How is this diagnosed?  Low blood sugar can be found with a blood test. This test tells you how much sugar is in your blood. It's done while you're having symptoms. Your health care provider may also do an exam and look at your medical history. How is this treated? Treating low blood sugar If you have low blood sugar, eat or drink something with sugar in it right away. The food or drink should have 15 grams of a fast-acting carbohydrate (carb). Options include: 4 oz (120 mL) of fruit juice. 4 oz (120 mL) of soda (not diet soda). A few pieces of hard candy. Check food labels to see how many pieces to eat. 1 Tbsp (15 mL) of sugar or honey. 4 glucose tablets. 1 tube of glucose gel. Treating low blood sugar if you have diabetes Talk with your provider about how much carb you should take. If you're alert  and can swallow safely, you may follow the 15:15 rule: Take 15 grams of a fast-acting carb. Check your blood sugar 15 minutes after you take the carb. If your blood sugar is still at or below 70 mg/dL (3.9 mmol/L), take 15 grams of a carb again. If your blood sugar doesn't go above 70 mg/dL (3.9 mmol/L) after 3 tries, get help right away. After your blood sugar goes back to normal, eat a meal or a snack within 1 hour. Always keep 15 grams of a fast-acting carb with you. This could be: 4 glucose tablets. A few pieces of hard candy. 1 Tbsp (15 mL) of honey or sugar. 1 tube of glucose gel. Treating very low blood sugar If  your blood sugar is less than 54 mg/dL (3 mmol/L), it's an emergency. Get help right away. If you can't eat or drink, you will need to be given glucagon . A family member or friend should learn how to check your blood sugar and give you glucagon . Ask your provider if you should keep a glucagon  kit at home. You may also need to be treated in a hospital. Follow these instructions at home: If you have diabetes: Always keep a fast-acting carb (15 grams) with you. Follow your diabetes care plan. Make sure you: Know the symptoms of low blood sugar. Check your blood sugar as often as told. Always check it before and after you exercise. Always check your blood sugar before you drive. Take your medicines as told. Eat on time. Do not skip meals. Share your diabetes care plan with: Your work or school. The people you live with. Wear an alert bracelet or carry a card that says you have diabetes. General instructions If you drink alcohol : Limit how much you have to: 0-1 drink a day if you're male. 0-2 drinks a day if you're male. Know how much alcohol  is in your drink. In the [place] one drink is one 12 oz bottle of beer (355 mL), one 5 oz glass of wine (148 mL), or one 1 oz glass of hard liquor (44 mL). Be sure to eat food when you drink alcohol . Be sure to check your blood sugar after you drink. Alcohol  may lead to low blood sugar later. Where to find more information American Diabetes Association (ADA): diabetes.org Contact a health care provider if: You have low blood sugar often. You have diabetes and are having trouble keeping your blood sugar in the right range. Get help right away if: You can't get your blood sugar above 70 mg/dL (3.9 mmol/L) after 3 tries. Your blood sugar is below 54 mg/dL (3 mmol/L). You have a seizure. You faint. These symptoms may be an emergency. Call 911 right away. Do not wait to see if the symptoms will go away. Do not drive yourself to the hospital. This  information is not intended to replace advice given to you by your health care provider. Make sure you discuss any questions you have with your health care provider. Document Revised: 09/15/2022 Document Reviewed: 09/15/2022 Elsevier Patient Education  2024 [employer].

## 2023-07-22 LAB — LIPID PANEL
Cholesterol: 120 mg/dL (ref 0–200)
HDL: 44 mg/dL (ref >39.00)
LDL Cholesterol: 58 mg/dL (ref 0–99)
NonHDL: 75.98
Total CHOL/HDL Ratio: 3
Triglycerides: 88 mg/dL (ref 0.0–149.0)
VLDL: 17.6 mg/dL (ref 0.0–40.0)

## 2023-07-22 LAB — COMPREHENSIVE METABOLIC PANEL
ALT: 23 U/L (ref 0–53)
AST: 27 U/L (ref 0–37)
Albumin: 4.5 g/dL (ref 3.5–5.2)
Alkaline Phosphatase: 71 U/L (ref 39–117)
BUN: 8 mg/dL (ref 6–23)
CO2: 25 meq/L (ref 19–32)
Calcium: 9.1 mg/dL (ref 8.4–10.5)
Chloride: 104 meq/L (ref 96–112)
Creatinine, Ser: 0.76 mg/dL (ref 0.40–1.50)
GFR: 96.38 mL/min (ref >60.00)
Glucose, Bld: 73 mg/dL (ref 70–99)
Potassium: 3.6 meq/L (ref 3.5–5.1)
Sodium: 139 meq/L (ref 135–145)
Total Bilirubin: 0.8 mg/dL (ref 0.2–1.2)
Total Protein: 7.3 g/dL (ref 6.0–8.3)

## 2023-07-22 LAB — HEMOGLOBIN A1C: Hgb A1c MFr Bld: 6.1 % (ref 4.6–6.5)

## 2023-07-27 ENCOUNTER — Encounter: Payer: Self-pay | Admitting: Family Medicine

## 2023-07-27 NOTE — Telephone Encounter (Signed)
 PT sent an update with some readings notes still getting some lows.

## 2023-07-28 MED ORDER — TIRZEPATIDE 7.5 MG/0.5ML ~~LOC~~ SOAJ: 7.5000 mg | SUBCUTANEOUS | 1 refills | Status: DC

## 2023-07-28 NOTE — Telephone Encounter (Signed)
Pt sent another update

## 2023-08-05 ENCOUNTER — Other Ambulatory Visit (HOSPITAL_COMMUNITY): Payer: Self-pay | Admitting: Psychiatry

## 2023-08-05 DIAGNOSIS — F330 Major depressive disorder, recurrent, mild: Secondary | ICD-10-CM

## 2023-08-09 ENCOUNTER — Other Ambulatory Visit (HOSPITAL_COMMUNITY): Payer: Self-pay | Admitting: Psychiatry

## 2023-08-09 ENCOUNTER — Ambulatory Visit (INDEPENDENT_AMBULATORY_CARE_PROVIDER_SITE_OTHER): Payer: BC Managed Care – PPO | Admitting: Licensed Clinical Social Worker

## 2023-08-09 ENCOUNTER — Encounter (HOSPITAL_COMMUNITY): Payer: Self-pay

## 2023-08-09 ENCOUNTER — Ambulatory Visit: Payer: Self-pay | Admitting: Family Medicine

## 2023-08-09 ENCOUNTER — Encounter: Payer: Self-pay | Admitting: Family Medicine

## 2023-08-09 DIAGNOSIS — F330 Major depressive disorder, recurrent, mild: Secondary | ICD-10-CM

## 2023-08-09 DIAGNOSIS — Z91199 Patient's noncompliance with other medical treatment and regimen due to unspecified reason: Secondary | ICD-10-CM

## 2023-08-09 NOTE — Telephone Encounter (Signed)
I am concerned with his symptoms, and that low O2 sat.  I do recommend he be seen, and should be in person evaluation today or Urgent care if needed later tonight if he is able to get someone to cover him at home temporarily.

## 2023-08-09 NOTE — Telephone Encounter (Signed)
Unfortunately is difficult for me to assess him without a visit and I am concerned about low O2 sat.  If he is having any shortness of breath, needs to be seen through urgent care or ER today.  If he is not able to do anything but a virtual visit, I guess that is better than nothing at least as a triage but again if he is having shortness of breath with that O2 sat should be seen today.

## 2023-08-09 NOTE — Telephone Encounter (Signed)
Patietn was offered appt and declined anything else to advise?

## 2023-08-09 NOTE — Progress Notes (Signed)
THERAPIST PROGRESS NOTE   Session Date: 08/09/2023  Session Time: 1500  Patient no-showed today's appointment; appointment was for follow up therapy, provider notified for review of record, patient agrees to reschedule missed appointment.   Leisa Lenz, MSW, LCSW 08/09/2023,  12:38 PM

## 2023-08-09 NOTE — Telephone Encounter (Signed)
Pt has been advised and I could not find an appointment at another [facility] today for virtual or otherwise, scheduled for tomorrow morning 11:40 no SOB at this time just notes a bothersome cough. Was given ER precautions

## 2023-08-09 NOTE — Telephone Encounter (Signed)
Copied from CRM [id number]. Topic: Clinical - Red Word Triage >> Aug 09, 2023 12:28 PM Denese Killings wrote: Red Word that prompted transfer to Nurse Triage: Patient thinks he may have the flu. He is very achy, alternatively hot and cold, headache, film in middle of chest, cant keep food down, and fatigue.   Chief Complaint: productive cough Symptoms: chills, congestion Frequency: ongoing since Friday Pertinent Negatives: Patient denies worsening shortness of breath  Disposition: [] ED /[] Urgent Care (no appt availability in office) / [x] Appointment(In office/virtual)/ []  [place] [facility]/ [] Home Care/ [] Refused Recommended Disposition /[]  Mobile Bus/ []  Follow-up with PCP Additional Notes: The patient reported an ongoing cough that started as a dry cough and worsened Saturday.  The cough is now productive and he coughs up mainly white frothy mucus and sometimes yellowish brown phlegm.  He vomited twice due to coughing.  He has chronic hiccups which causes pain in his diaphragm.  The pain has worsened due to his cough.  When he is not coughing there is no pain but when he is coughing the pain is 8/10.  An occupational therapist came to his home for his spouse and took his oxygen saturation as a kind gesture. It was 85%.  This was 1 hour prior to calling nurse triage.  When his oxygen saturation was taken, he had vomited recently and he had been exerting himself.  The occupational therapist did not recheck his oxygen saturation as she was there to care for his husband.  The patient stated tht he does not feel more short of breath than usual.  He has a history of COPD.      The patient declined a same day appointment as his spouse was in an accident and he is his caregiver and is unable to leave the house.   Reason for Disposition  [1] MILD difficulty breathing (e.g., minimal/no SOB at rest, SOB with walking, pulse <100) AND [2] still present when not coughing  Answer Assessment - Initial  Assessment Questions 1. ONSET: "When did the cough begin?"      Friday night 2. SEVERITY: "How bad is the cough today?"      Constant  3. SPUTUM: "Describe the color of your sputum" (none, dry cough; clear, white, yellow, green)     Mainly white frothy, yellowish and brown 4. HEMOPTYSIS: "Are you coughing up any blood?" If so ask: "How much?" (flecks, streaks, tablespoons, etc.)     None  5. DIFFICULTY BREATHING: "Are you having difficulty breathing?" If Yes, ask: "How bad is it?" (e.g., mild, moderate, severe)    - MILD: No SOB at rest, mild SOB with walking, speaks normally in sentences, can lie down, no retractions, pulse < 100.    - MODERATE: SOB at rest, SOB with minimal exertion and prefers to sit, cannot lie down flat, speaks in phrases, mild retractions, audible wheezing, pulse 100-120.    - SEVERE: Very SOB at rest, speaks in single words, struggling to breathe, sitting hunched forward, retractions, pulse > 120      None  6. FEVER: "Do you have a fever?" If Yes, ask: "What is your temperature, how was it measured, and when did it start?"     Chills - Hot and cold  100.3 7. CARDIAC HISTORY: "Do you have any history of heart disease?" (e.g., heart attack, congestive heart failure)      None  8. LUNG HISTORY: "Do you have any history of lung disease?"  (e.g., pulmonary embolus, asthma, emphysema)  Chronic hiccups   COPD 85% 10. OTHER SYMPTOMS: "Do you have any other symptoms?" (e.g., runny nose, wheezing, chest pain)       Center of chest around diaphragm   Chronic hiccups made pain worse  Congestion  Worsened on Saturday  Protocols used: Cough - Acute Productive-A-AH

## 2023-08-09 NOTE — Telephone Encounter (Signed)
Pt notes he has no way to leave his home, partner fell this morning and no one can come sit with him [facility] has to schedule a sitter a week in advance) notes he can't even come to an appt tomorrow would only be able to do Virtual but I dont think Virtual is sufficient given O2 SAT please advise I am unsure how best to help this patient

## 2023-08-10 ENCOUNTER — Telehealth: Payer: Self-pay | Admitting: Family Medicine

## 2023-08-10 ENCOUNTER — Encounter: Payer: Self-pay | Admitting: Family Medicine

## 2023-08-10 ENCOUNTER — Ambulatory Visit: Payer: BC Managed Care – PPO | Admitting: Podiatry

## 2023-08-10 ENCOUNTER — Telehealth (INDEPENDENT_AMBULATORY_CARE_PROVIDER_SITE_OTHER): Payer: BC Managed Care – PPO | Admitting: Family Medicine

## 2023-08-10 VITALS — Temp 99.9°F

## 2023-08-10 DIAGNOSIS — J111 Influenza due to unidentified influenza virus with other respiratory manifestations: Secondary | ICD-10-CM | POA: Diagnosis not present

## 2023-08-10 DIAGNOSIS — R059 Cough, unspecified: Secondary | ICD-10-CM

## 2023-08-10 NOTE — Progress Notes (Signed)
Virtual Visit via Video Note  I connected with Tori Cupps [place] on 08/10/23 at 12:51 PM by a video enabled telemedicine application and verified that I am speaking with the correct person using two identifiers.  Patient location: home. By self.  My location: office - [place].    I discussed the limitations, risks, security and privacy concerns of performing an evaluation and management service by telephone and the availability of in person appointments. I also discussed with the patient that there may be a patient responsible charge related to this service. The patient expressed understanding and agreed to proceed, consent obtained  Chief complaint:  Chief Complaint  Patient presents with   Cough    Pt notes had low O2 sat yesterday while nurse was there but notes no shortness of breath, has been coughing to the point of getting sick but notes not productive, fever, congestion    History of Present Illness: BRANTLEY WILEY is a 63 y.o. male  Cough: Started with slight cough 5 days ago - more notable cough the next day.  Fever 4 days ago. Vomiting after coughing 3 days ago - posttussive emesis only.   See message yesterday - eval recommended at that time for low O2sat measurement. No eval until today's visit. Was not feeling short of breath at that time.  No home O2 sat check today.  Spouse has come down with similar symptoms today. Home health nurse will check levels today.  Feeling much better today compared to yesterday. 110% from yesterday with less bodyaches, and no further fever. Able to take a shower, drinking fluids, plans on some increased food today.  Last fever yesterday, none today.  Negative home covid testing.   Hx of COPD. No benefit from inhalers  - not using inhalers.  Some productive cough. Some wheeze at times.  Tx: mucinex.    Patient Active Problem List   Diagnosis Date Noted   Major depressive disorder, recurrent episode, mild (HCC)  06/08/2023   GAD (generalized anxiety disorder) 06/08/2023   Pain due to onychomycosis of toenails of both feet 06/01/2023   Hammer toes of both feet 03/30/2023   Chronic pain of left lower extremity 09/30/2022   Type 2 diabetes mellitus with hyperglycemia, with long-term current use of insulin (HCC) 09/30/2022   Hiccups 04/01/2022   Newly diagnosed diabetes (HCC) 07/27/2018   Morbid obesity (HCC) 02/02/2017   Chronic pain due to trauma 06/25/2013   Depression, recurrent (HCC) 11/09/2012   CAD (coronary artery disease) 02/21/2012   Lipid disorder 02/21/2012   Angina pectoris (HCC) 09/27/2011   Obstructive sleep apnea 05/20/2010   Essential hypertension 04/17/2010   COPD mixed type (HCC) 04/17/2010   WEIGHT GAIN, ABNORMAL 04/17/2010   Past Medical History:  Diagnosis Date   Allergy    Anxiety    Arthritis    COPD (chronic obstructive pulmonary disease) (HCC)    Depression    Emphysema of lung (HCC)    Hyperlipidemia    Morbid obesity (HCC)    OSA (obstructive sleep apnea)    uses CPAP nightly   PONV (postoperative nausea and vomiting)    Unspecified essential hypertension    Past Surgical History:  Procedure Laterality Date   ESOPHAGOGASTRODUODENOSCOPY (EGD) WITH PROPOFOL N/A 10/16/2021   Procedure: ESOPHAGOGASTRODUODENOSCOPY (EGD) WITH PROPOFOL;  Surgeon: Jeani Hawking, MD;  Location: [facility];  Service: Endoscopy;  Laterality: N/A;   INSERTION OF MESH N/A 09/18/2015   Procedure: INSERTION OF MESH;  Surgeon: Abigail Miyamoto, MD;  Location: [facility];  Service: General;  Laterality: N/A;   LEG SURGERY Left    SAVORY DILATION N/A 10/16/2021   Procedure: SAVORY DILATION;  Surgeon: Jeani Hawking, MD;  Location: [facility];  Service: Endoscopy;  Laterality: N/A;   SHOULDER ARTHROSCOPY Left    TONSILLECTOMY     UMBILICAL HERNIA REPAIR N/A 09/18/2015   Procedure: HERNIA REPAIR UMBILICAL ADULT WITH MESH ;  Surgeon: Abigail Miyamoto, MD;  Location: [facility];  Service: General;  Laterality: N/A;   Allergies  Allergen Reactions   Penicillins Anaphylaxis   Valsartan Itching and Swelling   Prior to Admission medications   Medication Sig Start Date End Date Taking? Authorizing Provider  amLODipine (NORVASC) 5 MG tablet Take 1 tablet (5 mg total) by mouth daily. 04/20/23   Shade Flood, MD  aspirin 81 MG tablet Take 1 tablet (81 mg total) by mouth daily. 11/13/12   [name], NP  b complex vitamins tablet Take 1 tablet by mouth daily.    [provider]  calcium gluconate 500 MG tablet Take 500 mg by mouth daily.    [provider]  Carboxymethylcellul-Glycerin (LUBRICATING EYE DROPS OP) Place 1 drop into both eyes daily as needed (dry eyes).    [provider]  Cholecalciferol (VITAMIN D) 2000 UNITS tablet Take 1 tablet (2,000 Units total) by mouth daily. 11/13/12   [name], NP  Continuous Blood Gluc Sensor (FREESTYLE LIBRE 3 SENSOR) MISC Apply as instructed, change every 10 days. 10/15/22   Shade Flood, MD  Continuous Blood Gluc Sensor (FREESTYLE LIBRE 3 SENSOR) MISC Apply as instructed, change every 10 days. 10/15/22   Shade Flood, MD  diclofenac (VOLTAREN) 75 MG EC tablet Take 1 tablet by mouth 2 (two) times daily with a meal. 12/20/22   [provider]  gabapentin (NEURONTIN) 300 MG capsule TAKE 2 CAPSULES BY MOUTH 2 TIMES DAILY. 06/14/23   Shade Flood, MD  GARLIC PO Take 161 mg by mouth daily.    [provider]  Glucagon (GVOKE HYPOPEN 1-PACK) 1 MG/0.2ML SOAJ Inject 1 mg into the skin once as needed for up to 1 dose. 07/21/23   Shade Flood, MD  hydrOXYzine (VISTARIL) 50 MG capsule TAKE 2 CAPSULES BY MOUTH AT BEDTIME 05/09/23   Arfeen, Phillips Grout, MD  insulin glargine-yfgn [name], YFGN,) 100 UNIT/ML Pen INJECT 61 UNITS INTO THE SKIN DAILY 06/29/23   Shade Flood, MD  lamoTRIgine (LAMICTAL) 200 MG tablet Take 1 tablet (200 mg total) by mouth daily.  05/09/23   Arfeen, Phillips Grout, MD  losartan-hydrochlorothiazide (HYZAAR) 100-25 MG tablet Take 1 tablet by mouth daily. 04/20/23   Shade Flood, MD  Lutein 40 MG CAPS Take 40 mg by mouth at bedtime.    [provider]  magnesium gluconate (MAGONATE) 500 MG tablet Take 500 mg by mouth daily.    [provider]  metFORMIN (GLUCOPHAGE) 500 MG tablet Take 1 tablet (500 mg total) by mouth 2 (two) times daily with a meal. 04/20/23   Shade Flood, MD  Multiple Vitamin (MULTIVITAMIN) capsule Take 1 capsule by mouth daily. 11/13/12   [name], NP  nitroGLYCERIN (NITROSTAT) 0.4 MG SL tablet Place 1 tablet (0.4 mg total) under the tongue every 5 (five) minutes as needed for chest pain. If chest pain not resolved, after 3 doses 5 minutes apart--call 911 Patient taking differently: Place 0.4 mg under the tongue See admin instructions.  Take 0.4 mg as needed for hiccups 11/13/12   [name], NP  Potassium 99 MG TABS Take 99 mg by mouth at bedtime.    [provider]  sertraline (ZOLOFT) 100 MG tablet TAKE 1 TABLET BY MOUTH DAILY 05/09/23   Arfeen, Phillips Grout, MD  Shark Cartilage 740 MG CAPS Take 1 capsule by mouth 3 (three) times daily.    [provider]  simvastatin (ZOCOR) 20 MG tablet TAKE 1 TABLET BY MOUTH EVERYDAY AT BEDTIME 09/29/22   Shade Flood, MD  TECHLITE PLUS PEN NEEDLES 32G X 4 MM MISC USE ONCE DAILY WITH LANTUS. DX E11.9, Z79.4 12/07/22   Shade Flood, MD  tirzepatide [facility]) 7.5 MG/0.5ML Pen Inject 7.5 mg into the skin once a week. 07/28/23   Shade Flood, MD  traMADol (ULTRAM) 50 MG tablet Take 50 mg by mouth daily as needed for severe pain.    [provider]  traZODone (DESYREL) 100 MG tablet Take 1 tablet (100 mg total) by mouth at bedtime as needed for sleep. 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  Tobacco Use   Smoking status: Former    Current packs/day: 0.00    Average packs/day: 2.0 packs/day for 26.0 years (52.0 ttl pk-yrs)    Types: Cigarettes    Start date: 08/12/1977    Quit date: 08/13/2003    Years since quitting: 20.0   Smokeless tobacco: Never   Tobacco comments:    2ppd x 26 years  Vaping Use   Vaping status: Never Used  Substance and Sexual Activity   Alcohol use: No    Alcohol/week: 0.0 standard drinks of alcohol    Comment: social   Drug use: No   Sexual activity: Yes    Partners: Male    Birth control/protection: None  Other Topics Concern   Not on file  Social History Narrative   Married. Education: college. Exercise: No.   Social Drivers of Health   Financial Resource Strain: Low Risk  (07/18/2023)   Overall Financial Resource Strain (CARDIA)    Difficulty of Paying Living Expenses: Not hard at all  Food Insecurity: No Food Insecurity (07/18/2023)   Hunger Vital Sign    Worried About Running Out of Food in the Last Year: Never true    Ran Out of Food in the Last Year: Never true  Transportation Needs: No Transportation Needs (07/18/2023)   PRAPARE - Administrator, Civil Service (Medical): No    Lack of Transportation (Non-Medical): No  Physical Activity: Sufficiently Active (07/18/2023)   Exercise Vital Sign    Days of Exercise per Week: 7 days    Minutes of Exercise per Session: 60 min  Stress: Stress Concern Present (07/18/2023)   [employer] of Occupational Health - Occupational Stress Questionnaire    Feeling of Stress : To some extent  Social Connections: Moderately Isolated (07/18/2023)   Social Connection and Isolation Panel [NHANES]    Frequency of Communication with Friends and Family: More than three times a week    Frequency of Social Gatherings with Friends and Family: Never    Attends  Religious Services: Never    Active  Member of Clubs or Organizations: No    Attends Engineer, structural: Not on file    Marital Status: Married  Catering manager Violence: Not on file    Observations/Objective: Vitals:   08/10/23 1144  Temp: 99.9 F (37.7 C)  TempSrc: Oral  Speaking in full sentences without respiratory distress, audible wheeze or stridor. Few episodes of slight cough during video visit.  Nontoxic appearance, appropriate responses.  Assessment and Plan: Cough, unspecified type  Influenza-like illness Suspected influenza or influenza-like illness, viral illness with initial fever, myalgias, cough.  Significant improvement overnight.  He does have a history of COPD but inhalers have not been beneficial previously, and again symptoms are improving.  Low O2 sat noted at home yesterday, but he has had borderline O2 sats previously, including when seen by pulmonary.  92% at September 2024 visit. Based on his significant improvement since last night, we will hold on steroids, antibiotics or other acute treatments at this time.  Continue Mucinex.  Update on symptoms in the next 24 to 48 hours and if he is not continuing to improve or any worsening in that time, recommended in person visit with any provider.  Urgent care/ER precautions were also discussed.  Follow Up Instructions: As needed as above.   I discussed the assessment and treatment plan with the patient. The patient was provided an opportunity to ask questions and all were answered. The patient agreed with the plan and demonstrated an understanding of the instructions.   The patient was advised to call back or seek an in-person evaluation if the symptoms worsen or if the condition fails to improve as anticipated.   Shade Flood, MD

## 2023-08-10 NOTE — Patient Instructions (Signed)
Good talking to you today.  I am sorry you have been sick but encouraged by the significant improvement overnight.  I suspect you will continue to improve and I suspect you had the flu or another virus.  Okay to continue Mucinex, make sure to stay hydrated, and give me an update in the next 24 to 48 hours.  If not continuing to improve at that time I do recommend in person evaluation to decide if other meds are needed.  Urgent care or ER if worse but I do not expect that to occur.  Hang in there.

## 2023-08-10 NOTE — Telephone Encounter (Signed)
Pt was seen today by PCP.

## 2023-08-10 NOTE — Telephone Encounter (Signed)
Patient has been waiting for the dr to join the appt and he has now been kicked  from the appt he needs to be re linked to the appt

## 2023-08-11 ENCOUNTER — Telehealth (HOSPITAL_BASED_OUTPATIENT_CLINIC_OR_DEPARTMENT_OTHER): Payer: BC Managed Care – PPO | Admitting: Psychiatry

## 2023-08-11 ENCOUNTER — Encounter (HOSPITAL_COMMUNITY): Payer: Self-pay | Admitting: Psychiatry

## 2023-08-11 ENCOUNTER — Telehealth (HOSPITAL_COMMUNITY): Payer: BC Managed Care – PPO | Admitting: Psychiatry

## 2023-08-11 VITALS — Wt 325.0 lb

## 2023-08-11 DIAGNOSIS — F330 Major depressive disorder, recurrent, mild: Secondary | ICD-10-CM | POA: Diagnosis not present

## 2023-08-11 DIAGNOSIS — F411 Generalized anxiety disorder: Secondary | ICD-10-CM | POA: Diagnosis not present

## 2023-08-11 MED ORDER — LAMOTRIGINE 200 MG PO TABS: 200.0000 mg | ORAL_TABLET | Freq: Every day | ORAL | 0 refills | Status: DC

## 2023-08-11 MED ORDER — SERTRALINE HCL 100 MG PO TABS: ORAL_TABLET | ORAL | 0 refills | Status: DC

## 2023-08-11 MED ORDER — TRAZODONE HCL 100 MG PO TABS: 100.0000 mg | ORAL_TABLET | Freq: Every evening | ORAL | 0 refills | Status: DC | PRN

## 2023-08-11 MED ORDER — HYDROXYZINE PAMOATE 50 MG PO CAPS: ORAL_CAPSULE | ORAL | 0 refills | Status: DC

## 2023-08-11 NOTE — Progress Notes (Signed)
[facility] MD Virtual Progress Note   Patient Location: Home Provider Location: Office  I connect with patient by telephone and verified that I am speaking with correct person by using two identifiers. I discussed the limitations of evaluation and management by telemedicine and the availability of in person appointments. I also discussed with the patient that there may be a patient responsible charge related to this service. The patient expressed understanding and agreed to proceed.  Matthew Hutchinson [id number] 63 y.o.  08/11/2023 1:21 PM  History of Present Illness:  Patient is evaluated by phone session.  His video camera is not working.  He reported very stressed recently because his partner [name] is not doing very well.  He recently had a fall again and require hospitalization.  He is concerned because he cannot afford nursing home or rehabilitation.  He is not sure what to do but now seriously thinking about getting him divorce so state can help him.  He also very worried about political environment.  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  He sleeps on and off because he is waking up to taking care of his partner every few hours.  He denies any crying spells but gets sometimes irritated around people when he get remarks about his sexual orientation.  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  On the other hand he had a good news that his hemoglobin A1c is much better and he lost 25 pounds.  He has no rash, itching, tremors or shakes.  Denies any suicidal thoughts or homicidal thoughts.  He denies any major panic attack but admitted remain anxious and nervous due to his current living situation.  He has no major concern from the medication.  Past Psychiatric History: H/O inpatient at [facility] for overdose on Ambien.  H/O overdose on Ambien 2 other times but did not require inpatient treatment.  H/O of mood  swing, impulsive behavior, speeding ticket, anger, road rage.  No history of psychosis or hallucination.    Outpatient Encounter Medications as of 08/11/2023  Medication Sig   amLODipine (NORVASC) 5 MG tablet Take 1 tablet (5 mg total) by mouth daily.   aspirin 81 MG tablet Take 1 tablet (81 mg total) by mouth daily.   b complex vitamins tablet Take 1 tablet by mouth daily.   calcium gluconate 500 MG tablet Take 500 mg by mouth daily.   Carboxymethylcellul-Glycerin (LUBRICATING EYE DROPS OP) Place 1 drop into both eyes daily as needed (dry eyes).   Cholecalciferol (VITAMIN D) 2000 UNITS tablet Take 1 tablet (2,000 Units total) by mouth daily.   Continuous Blood Gluc Sensor (FREESTYLE LIBRE 3 SENSOR) MISC Apply as instructed, change every 10 days.   Continuous Blood Gluc Sensor (FREESTYLE LIBRE 3 SENSOR) MISC Apply as instructed, change every 10 days.   diclofenac (VOLTAREN) 75 MG EC tablet Take 1 tablet by mouth 2 (two) times daily with a meal.   gabapentin (NEURONTIN) 300 MG capsule TAKE 2 CAPSULES BY MOUTH 2 TIMES DAILY.   GARLIC PO Take 782 mg by mouth daily.   Glucagon (GVOKE HYPOPEN 1-PACK) 1 MG/0.2ML SOAJ Inject 1 mg into the skin once as needed for up to 1 dose.   hydrOXYzine (VISTARIL) 50 MG capsule TAKE 2 CAPSULES BY MOUTH AT BEDTIME   insulin glargine-yfgn (SEMGLEE, YFGN,) 100 UNIT/ML Pen INJECT 61 UNITS INTO THE SKIN DAILY   lamoTRIgine (LAMICTAL) 200 MG  tablet Take 1 tablet (200 mg total) by mouth daily.   losartan-hydrochlorothiazide (HYZAAR) 100-25 MG tablet Take 1 tablet by mouth daily.   Lutein 40 MG CAPS Take 40 mg by mouth at bedtime.   magnesium gluconate (MAGONATE) 500 MG tablet Take 500 mg by mouth daily.   metFORMIN (GLUCOPHAGE) 500 MG tablet Take 1 tablet (500 mg total) by mouth 2 (two) times daily with a meal.   Multiple Vitamin (MULTIVITAMIN) capsule Take 1 capsule by mouth daily.   nitroGLYCERIN (NITROSTAT) 0.4 MG SL tablet Place 1 tablet (0.4 mg total) under the  tongue every 5 (five) minutes as needed for chest pain. If chest pain not resolved, after 3 doses 5 minutes apart--call 911 (Patient taking differently: Place 0.4 mg under the tongue See admin instructions. Take 0.4 mg as needed for hiccups)   Potassium 99 MG TABS Take 99 mg by mouth at bedtime.   sertraline (ZOLOFT) 100 MG tablet TAKE 1 TABLET BY MOUTH DAILY   Shark Cartilage 740 MG CAPS Take 1 capsule by mouth 3 (three) times daily.   simvastatin (ZOCOR) 20 MG tablet TAKE 1 TABLET BY MOUTH EVERYDAY AT BEDTIME   TECHLITE PLUS PEN NEEDLES 32G X 4 MM MISC USE ONCE DAILY WITH LANTUS. DX E11.9, Z79.4   tirzepatide (MOUNJARO) 7.5 MG/0.5ML Pen Inject 7.5 mg into the skin once a week.   traMADol (ULTRAM) 50 MG tablet Take 50 mg by mouth daily as needed for severe pain.   traZODone (DESYREL) 100 MG tablet Take 1 tablet (100 mg total) by mouth at bedtime as needed for sleep.   No facility-administered encounter medications on file as of 08/11/2023.    Recent Results (from the past 2160 hours)  Comprehensive metabolic panel     Status: None   Collection Time: 07/21/23  4:49 PM  Result Value Ref Range   Sodium 139 135 - 145 mEq/L   Potassium 3.6 3.5 - 5.1 mEq/L   Chloride 104 96 - 112 mEq/L   CO2 25 19 - 32 mEq/L   Glucose, Bld 73 70 - 99 mg/dL   BUN 8 6 - 23 mg/dL   Creatinine, Ser 6.04 0.40 - 1.50 mg/dL   Total Bilirubin 0.8 0.2 - 1.2 mg/dL   Alkaline Phosphatase 71 39 - 117 U/L   AST 27 0 - 37 U/L   ALT 23 0 - 53 U/L   Total Protein 7.3 6.0 - 8.3 g/dL   Albumin 4.5 3.5 - 5.2 g/dL   GFR 54.09 >81.19 mL/min    Comment: Calculated using the CKD-EPI Creatinine Equation (2021)   Calcium 9.1 8.4 - 10.5 mg/dL  Lipid panel     Status: None   Collection Time: 07/21/23  4:49 PM  Result Value Ref Range   Cholesterol 120 0 - 200 mg/dL    Comment: ATP III Classification       Desirable:  < 200 mg/dL               Borderline High:  200 - 239 mg/dL          High:  > = 147 mg/dL   Triglycerides 82.9  0.0 - 149.0 mg/dL    Comment: Normal:  <562 mg/dLBorderline High:  150 - 199 mg/dL   HDL 13.08 >65.78 mg/dL   VLDL 46.9 0.0 - 62.9 mg/dL   LDL Cholesterol 58 0 - 99 mg/dL   Total CHOL/HDL Ratio 3     Comment:  Men          Women1/2 Average Risk     3.4          3.3Average Risk          5.0          4.42X Average Risk          9.6          7.13X Average Risk          15.0          11.0                       NonHDL 75.98     Comment: NOTE:  Non-HDL goal should be 30 mg/dL higher than patient's LDL goal (i.e. LDL goal of < 70 mg/dL, would have non-HDL goal of < 100 mg/dL)  Hemoglobin A5W     Status: None   Collection Time: 07/21/23  4:49 PM  Result Value Ref Range   Hgb A1c MFr Bld 6.1 4.6 - 6.5 %    Comment: Glycemic Control Guidelines for People with Diabetes:Non Diabetic:  <6%Goal of Therapy: <7%Additional Action Suggested:  >8%      Psychiatric Specialty Exam: Physical Exam  Review of Systems  Weight (!) 325 lb (147.4 kg).There is no height or weight on file to calculate BMI.  General Appearance: NA  Eye Contact:  NA  Speech:  Slow  Volume:  Decreased  Mood:  Anxious and Dysphoric  Affect:  Congruent  Thought Process:  Descriptions of Associations: Intact  Orientation:  Full (Time, Place, and Person)  Thought Content:  Rumination  Suicidal Thoughts:  No  Homicidal Thoughts:  No  Memory:  Immediate;   Good Recent;   Good Remote;   Good  Judgement:  Intact  Insight:  Present  Psychomotor Activity:  NA  Concentration:  Concentration: Good and Attention Span: Good  Recall:  Good  Fund of Knowledge:  Good  Language:  Good  Akathisia:  No  Handed:  Right  AIMS (if indicated):     Assets:  Communication Skills Desire for Improvement Financial Resources/Insurance Housing Transportation  ADL's:  Intact  Cognition:  WNL  Sleep:  use CPAP, fair     Assessment/Plan: Major depressive disorder, recurrent episode, mild (HCC) - Plan: hydrOXYzine (VISTARIL) 50 MG  capsule, lamoTRIgine (LAMICTAL) 200 MG tablet, sertraline (ZOLOFT) 100 MG tablet, traZODone (DESYREL) 100 MG tablet  GAD (generalized anxiety disorder) - Plan: sertraline (ZOLOFT) 100 MG tablet  I reviewed blood work results.  Hemoglobin A1c 6.1 and he lost 20 pounds since the last visit.  Discussed psychosocial stressors and living situation.  He is not sure if he can further afford taking care of his partner who requires a lot of help.  His partner has a lot of health issues and history of fall and recently hurt his foot and requires a lot of care.  He admitted not able to see therapist but like to schedule appointment soon.  He does not want to change the medication since he feels it is at least keeping him is stable.  Continue Zoloft 100 mg daily, trazodone 1 mg at bedtime, Lamictal 200 mg daily and hydroxyzine 50 mg twice a day.  He has no rash, itching, tremors or shakes.  Recommended to call us back if is any question or any concern.  Follow-up in 3 months or sooner if needed.   Follow Up Instructions:     I  discussed the assessment and treatment plan with the patient. The patient was provided an opportunity to ask questions and all were answered. The patient agreed with the plan and demonstrated an understanding of the instructions.   The patient was advised to call back or seek an in-person evaluation if the symptoms worsen or if the condition fails to improve as anticipated.    Collaboration of Care: Other provider involved in patient's care AEB notes are available in epic to review  Patient/Guardian was advised Release of Information must be obtained prior to any record release in order to collaborate their care with an outside provider. Patient/Guardian was advised if they have not already done so to contact the registration department to sign all necessary forms in order for [place] to release information regarding their care.   Consent: Patient/Guardian gives verbal consent for treatment  and assignment of benefits for services provided during this visit. Patient/Guardian expressed understanding and agreed to proceed.     I provided 23 minutes of non face to face time during this encounter.  Note: This document was prepared by [employer] voice dictation technology and any errors that results from this process are unintentional.    Cleotis Nipper, MD 08/11/2023

## 2023-08-15 ENCOUNTER — Ambulatory Visit (INDEPENDENT_AMBULATORY_CARE_PROVIDER_SITE_OTHER): Payer: BC Managed Care – PPO | Admitting: Licensed Clinical Social Worker

## 2023-08-15 DIAGNOSIS — F411 Generalized anxiety disorder: Secondary | ICD-10-CM | POA: Diagnosis not present

## 2023-08-15 DIAGNOSIS — F330 Major depressive disorder, recurrent, mild: Secondary | ICD-10-CM | POA: Diagnosis not present

## 2023-08-15 NOTE — Progress Notes (Unsigned)
THERAPIST PROGRESS NOTE   Session Date: 08/15/2023  Session Time: 1509 - 1605 Virtual Visit via Video Note  I connected with Matthew Hutchinson on 08/15/23 at  3:00 PM EST by a video enabled telemedicine application and verified that I am speaking with the correct person using two identifiers.  Location: Patient: Home [employer], minerals in driveway) Provider: [facility] [place] Office   I discussed the limitations of evaluation and management by telemedicine and the availability of in person appointments. The patient expressed understanding and agreed to proceed.  I discussed the assessment and treatment plan with the patient. The patient was provided an opportunity to ask questions and all were answered. The patient agreed with the plan and demonstrated an understanding of the instructions.   The patient was advised to call back or seek an in-person evaluation if the symptoms worsen or if the condition fails to improve as anticipated.  I provided 56 minutes of non-face-to-face time during this encounter.  Participation Level: Active  Behavioral Response: CasualAlertAnxious and Depressed  Type of Therapy: Individual Therapy  Treatment Goals addressed:  - LTG: Reduce frequency, intensity, and duration of depression symptoms so that daily functioning is improved (OP Depression) - LTG: Increase coping skills to manage depression and improve ability to perform daily activities (OP Depression) - STG: Reduce overall depression score by a minimum of 25% on the Patient Health Questionnaire (PHQ-9) (OP Depression) - STG: Khaled will identify cognitive patterns and beliefs that support depression (OP Depression) - STG: Ambrose will reduce frequency of avoidant behaviors by 50% as evidenced by self-report in therapy sessions (Anxiety) - LTG: "Continue to keep depression manageable when faced with the inevitable horribleness" (OP Depression) - LTG: "Maintain abilities at managing anxiety and have minimal panic  attacks" (Anxiety)  ProgressTowards Goals: Initial  Interventions: CBT, Motivational Interviewing, and Supportive  Summary: [name] is a 63 y.o. male with past psych history of MDD and GAD, presenting for follow-up therapy session in efforts to improve management of depressive and anxious symptoms. Patient actively engaged in session, presenting in anxious and depressed moods and congruent affect throughout session, with periods of improved presentation.  - Pt openly engaged with clinician, sharing of increased stress occurring within the home surrounding spouses presenting medical needs as a result of recent physical decline, worsening pt's depressive and anxious sxs. - Pt actively engaged in re-administering of PHQ-9 and GAD-7, further engaging in review of variances in scores, noting of increased sxs irritability trouble relaxing, worrying about different things, increased feelings of down and depressed, challenges focusing, noticing work responsibilities being compromised, and feeling of having let himself and spouse down. 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 - Engaged pt in navigating means in managing stress, and how things would be different if he did not attempt to resolve all presenting stressors in the manners in which he has historically, exploring potential availability of natural supports and alternate measures and/or parameters that could be put in place in the home to support in the improved management of sxs.    Patient responded well to interventions. Patient continues to meet criteria for MDD and GAD. Patient will continue to benefit from engagement in outpatient  therapy due to being the least restrictive service to meet presenting needs.  08/15/2023    3:15 PM 07/21/2023    3:41 PM 06/08/2023    1:29 PM 01/31/2023    4:15 PM 12/30/2022    3:40 PM  Depression screen PHQ 2/9  Decreased Interest 0 1 0 1 1  Down, Depressed, Hopeless 3 2 1 1 1   PHQ - 2 Score 3 3 1 2 2   Altered sleeping 0 1 0 1 2  Tired, decreased energy 3 1 1 1 2   Change in appetite 0 0 1 2 1   Feeling bad or failure about yourself  3 1 1  0 0  Trouble concentrating 3 0 1 0 1  Moving slowly or fidgety/restless 2 1 0 0 1  Suicidal thoughts 0 0 0 0 0  PHQ-9 Score 14 7 5 6 9   Difficult doing work/chores Extremely dIfficult  Somewhat difficult     Flowsheet Row Counselor from 11/24/2022 in [facility] from 10/05/2022 in [facility] from 08/04/2022 in [facility] RISK CATEGORY Low Risk Low Risk Low Risk         08/15/2023    3:14 PM 07/21/2023    3:41 PM 06/08/2023    1:26 PM 01/31/2023    4:15 PM  GAD 7 : Generalized Anxiety Score  Nervous, Anxious, on Edge 1 1 3 1   Control/stop worrying 3 3 3 2   Worry too much - different things 3 2 3 2   Trouble relaxing 3 3 3  0  Restless 0 1 3 0  Easily annoyed or irritable 3 2 3 1   Afraid - awful might happen 3 3 3 1   Total GAD 7 Score 16 15 21 7   Anxiety Difficulty Extremely difficult  Somewhat difficult      Suicidal/Homicidal: Nowithout intent/plan  Therapist Response: Clinician utilized CBT, MI, and supportive reflection interventions to address presenting sxs. Clinician actively engaged pt in check-in, assessing mood and affect. Re-administered PHQ-9 and GAD-7, engaging pt in review of recent events and observed variances in sxs. Further explored recounts of recent events surrounding stressors related to spouses medical complications, eliciting pt's thoughts and feelings surrounding  stressors, challenging thoughts  and cognitive distortions. Supported pt in exploration of available supports and resources from which pt could utilize in efforts to implement parameters to support spouse and in turn reduce own sxs.  Clinician reassessed severity of presenting sxs, and presence of any safety concerns. Therapist provided support and empathy to patient during session.  Plan: Return again in 4 weeks.  Diagnosis:  Encounter Diagnoses  Name Primary?   Major depressive disorder, recurrent episode, mild (HCC) Yes   GAD (generalized anxiety disorder)     Collaboration of Care: Other none necessary at this time.  Patient/Guardian was advised Release of Information must be obtained prior to any record release in order to collaborate their care with an outside provider. Patient/Guardian was advised if they have not already done so to contact the registration department to sign all necessary forms in order for [place] to release information regarding their care.   Consent: Patient/Guardian gives verbal consent for treatment and assignment of benefits for services provided during this visit. Patient/Guardian expressed understanding and agreed to proceed.   Leisa Lenz, MSW, LCSW 08/15/2023,  3:18 PM

## 2023-08-16 ENCOUNTER — Other Ambulatory Visit: Payer: Self-pay | Admitting: Family Medicine

## 2023-08-16 DIAGNOSIS — E1165 Type 2 diabetes mellitus with hyperglycemia: Secondary | ICD-10-CM

## 2023-08-18 ENCOUNTER — Encounter: Payer: Self-pay | Admitting: Family Medicine

## 2023-08-18 ENCOUNTER — Ambulatory Visit: Payer: BC Managed Care – PPO | Admitting: Family Medicine

## 2023-08-18 VITALS — BP 126/74 | HR 79 | Temp 98.6°F | Ht 74.0 in | Wt 313.5 lb

## 2023-08-18 DIAGNOSIS — J111 Influenza due to unidentified influenza virus with other respiratory manifestations: Secondary | ICD-10-CM | POA: Diagnosis not present

## 2023-08-18 DIAGNOSIS — E162 Hypoglycemia, unspecified: Secondary | ICD-10-CM | POA: Diagnosis not present

## 2023-08-18 DIAGNOSIS — J449 Chronic obstructive pulmonary disease, unspecified: Secondary | ICD-10-CM

## 2023-08-18 DIAGNOSIS — E662 Morbid (severe) obesity with alveolar hypoventilation: Secondary | ICD-10-CM

## 2023-08-18 DIAGNOSIS — R059 Cough, unspecified: Secondary | ICD-10-CM

## 2023-08-18 DIAGNOSIS — E1165 Type 2 diabetes mellitus with hyperglycemia: Secondary | ICD-10-CM | POA: Diagnosis not present

## 2023-08-18 DIAGNOSIS — Z794 Long term (current) use of insulin: Secondary | ICD-10-CM

## 2023-08-18 MED ORDER — ARNUITY ELLIPTA 100 MCG/ACT IN AEPB: 100.0000 ug | INHALATION_SPRAY | Freq: Every day | RESPIRATORY_TRACT | 1 refills | Status: DC

## 2023-08-18 NOTE — Progress Notes (Signed)
 Subjective:  Patient ID: Matthew Hutchinson, male    DOB: 03-13-1961  Age: 63 y.o. MRN: [id number]  CC:  Chief Complaint  Patient presents with   Medical Management of Chronic Issues    Here for DM f/u.    HPI Matthew Hutchinson presents for   Diabetes: Complicated by hypoglycemia, prior hyperglycemia.  Follow-up from January 9 visit. Treated with Mounjaro  5 mg weekly, metformin  500 mg twice daily. With weight loss he had been self adjusting insulin  for approximately 1 month at his last visit, had been experiencing lows in the 50s every other night.  We were unaware of those readings.  He had adjusted his Semglee  to 34 to 42 units over the previous few weeks based on the number of drops tonight before.  Blood sugar 69 in the office last visit, reported lowest reading of 42 in December of last year with dizziness, blurry vision, nausea but no syncope.  Treated with eating candy when it was low.  Some difficulty with meals in the past, was still eating 1 meal per day, less snacks during the day as he was losing track of time.  He does have a CGM. Weight had decreased from 345 in October to 325 at his January 9th visit.  Lab Results  Component Value Date   HGBA1C 6.1 07/21/2023  Treated with glucose tablet in office last visit for CGM reading of 53, in office fingerstick was in the 70s.  Due to risk of hypoglycemia I decreased his insulin  to 20 units/day initially with close monitoring and close follow-up.  Glucagon  pen given if needed for severe symptomatic Hypoglycemia.  Since last visit: Last 7 days on cgm: 92% 70-180, 8% 54-69 Semglee  10 units per day. Off insulin  during illness last week.  Up to 7.5mg  mounjaro   - 1st dose last week.  No need for Gvoke pen.     Influenza/influenza-like illness Video visit on January 29.  Suspected influenza at that time with sick contact, history of COPD but symptoms were improving and no relief with inhalers previously for COPD.   Borderline O2 sats discussed at his video visit, have been borderline in office as well.  Decided to hold on steroids, antibiotics or other acute treatments based on his improvement on the video visit and continued on symptomatic care.  Feels better. Still feeling of congestion in back of throat. Last fever 1 week ago. Some wheeze - more when lying down.  Feels like steroid inhlaer in past made wheezing worse.  Vomiting last week, 1 episode after drinking tang last night after taking pills.  Per note in 2021 - Dr. Neysa noted Arnuity had helped when needed along with singulair.  Hx of COPD, prior spirometry reflected obesity related hypoventilation, probably with some obstruction at least in small airways.  Home O2sat in mid 90's recently.     History Patient Active Problem List   Diagnosis Date Noted   Major depressive disorder, recurrent episode, mild (HCC) 06/08/2023   GAD (generalized anxiety disorder) 06/08/2023   Pain due to onychomycosis of toenails of both feet 06/01/2023   Hammer toes of both feet 03/30/2023   Chronic pain of left lower extremity 09/30/2022   Type 2 diabetes mellitus with hyperglycemia, with long-term current use of insulin  (HCC) 09/30/2022   Hiccups 04/01/2022   Newly diagnosed diabetes (HCC) 07/27/2018   Morbid obesity (HCC) 02/02/2017   Chronic pain due to trauma 06/25/2013   Depression, recurrent (HCC) 11/09/2012   CAD (coronary artery  disease) 02/21/2012   Lipid disorder 02/21/2012   Angina pectoris (HCC) 09/27/2011   Obstructive sleep apnea 05/20/2010   Essential hypertension 04/17/2010   COPD mixed type (HCC) 04/17/2010   WEIGHT GAIN, ABNORMAL 04/17/2010   Past Medical History:  Diagnosis Date   Allergy    Anxiety    Arthritis    COPD (chronic obstructive pulmonary disease) (HCC)    Depression    Emphysema of lung (HCC)    Hyperlipidemia    Morbid obesity (HCC)    OSA (obstructive sleep apnea)    uses CPAP nightly   PONV (postoperative  nausea and vomiting)    Unspecified essential hypertension    Past Surgical History:  Procedure Laterality Date   ESOPHAGOGASTRODUODENOSCOPY (EGD) WITH PROPOFOL  N/A 10/16/2021   Procedure: ESOPHAGOGASTRODUODENOSCOPY (EGD) WITH PROPOFOL ;  Surgeon: Rollin Dover, MD;  Location: [facility];  Service: Endoscopy;  Laterality: N/A;   INSERTION OF MESH N/A 09/18/2015   Procedure: INSERTION OF MESH;  Surgeon: Vicenta Poli, MD;  Location: [facility];  Service: General;  Laterality: N/A;   LEG SURGERY Left    SAVORY DILATION N/A 10/16/2021   Procedure: SAVORY DILATION;  Surgeon: Rollin Dover, MD;  Location: [facility];  Service: Endoscopy;  Laterality: N/A;   SHOULDER ARTHROSCOPY Left    TONSILLECTOMY     UMBILICAL HERNIA REPAIR N/A 09/18/2015   Procedure: HERNIA REPAIR UMBILICAL ADULT WITH MESH ;  Surgeon: Vicenta Poli, MD;  Location:  SURGERY CENTER;  Service: General;  Laterality: N/A;   Allergies  Allergen Reactions   Penicillins Anaphylaxis   Valsartan Itching and Swelling   Prior to Admission medications   Medication Sig Start Date End Date Taking? Authorizing Provider  amLODipine  (NORVASC ) 5 MG tablet Take 1 tablet (5 mg total) by mouth daily. 04/20/23  Yes Levora Matthew SAUNDERS, MD  aspirin  81 MG tablet Take 1 tablet (81 mg total) by mouth daily. 11/13/12  Yes Jacquetta Sharlot GRADE, NP  b complex vitamins tablet Take 1 tablet by mouth daily.   Yes [provider]  calcium  gluconate 500 MG tablet Take 500 mg by mouth daily.   Yes [provider]  Carboxymethylcellul-Glycerin (LUBRICATING EYE DROPS OP) Place 1 drop into both eyes daily as needed (dry eyes).   Yes [provider]  Cholecalciferol  (VITAMIN D ) 2000 UNITS tablet Take 1 tablet (2,000 Units total) by mouth daily. 11/13/12  Yes Jacquetta Sharlot GRADE, NP  Continuous Blood Gluc Sensor (FREESTYLE LIBRE 3 SENSOR) MISC Apply as instructed, change every 10 days. 10/15/22  Yes Levora Matthew SAUNDERS, MD   Continuous Blood Gluc Sensor (FREESTYLE LIBRE 3 SENSOR) MISC Apply as instructed, change every 10 days. 10/15/22  Yes Levora Matthew SAUNDERS, MD  diclofenac (VOLTAREN) 75 MG EC tablet Take 1 tablet by mouth 2 (two) times daily with a meal. 12/20/22  Yes [provider]  gabapentin  (NEURONTIN ) 300 MG capsule TAKE 2 CAPSULES BY MOUTH 2 TIMES DAILY. 06/14/23  Yes Levora Matthew SAUNDERS, MD  GARLIC PO Take 100 mg by mouth daily.   Yes [provider]  Glucagon  (GVOKE HYPOPEN  1-PACK) 1 MG/0.2ML SOAJ Inject 1 mg into the skin once as needed for up to 1 dose. 07/21/23  Yes Levora Matthew SAUNDERS, MD  hydrOXYzine  (VISTARIL ) 50 MG capsule TAKE 2 CAPSULES BY MOUTH AT BEDTIME 08/11/23  Yes Arfeen, Leni DASEN, MD  insulin  glargine-yfgn (SEMGLEE , YFGN,) 100 UNIT/ML Pen INJECT 61 UNITS INTO THE SKIN DAILY 06/29/23  Yes Levora Matthew SAUNDERS, MD  lamoTRIgine  (  LAMICTAL ) 200 MG tablet Take 1 tablet (200 mg total) by mouth daily. 08/11/23  Yes Arfeen, Leni DASEN, MD  losartan -hydrochlorothiazide  (HYZAAR) 100-25 MG tablet Take 1 tablet by mouth daily. 04/20/23  Yes Levora Matthew SAUNDERS, MD  Lutein  40 MG CAPS Take 40 mg by mouth at bedtime.   Yes [provider]  magnesium  gluconate (MAGONATE) 500 MG tablet Take 500 mg by mouth daily.   Yes [provider]  metFORMIN  (GLUCOPHAGE ) 500 MG tablet Take 1 tablet (500 mg total) by mouth 2 (two) times daily with a meal. 04/20/23  Yes Levora Matthew SAUNDERS, MD  Multiple Vitamin (MULTIVITAMIN) capsule Take 1 capsule by mouth daily. 11/13/12  Yes Jacquetta Sharlot GRADE, NP  nitroGLYCERIN  (NITROSTAT ) 0.4 MG SL tablet Place 1 tablet (0.4 mg total) under the tongue every 5 (five) minutes as needed for chest pain. If chest pain not resolved, after 3 doses 5 minutes apart--call 911 Patient taking differently: Place 0.4 mg under the tongue See admin instructions. Take 0.4 mg as needed for hiccups 11/13/12  Yes Jacquetta Sharlot GRADE, NP  Potassium 99 MG TABS Take 99 mg by mouth at bedtime.   Yes [provider]  sertraline  (ZOLOFT ) 100 MG tablet TAKE 1 TABLET BY MOUTH DAILY 08/11/23  Yes Arfeen, Leni DASEN, MD  Shark Cartilage 740 MG CAPS Take 1 capsule by mouth 3 (three) times daily.   Yes [provider]  simvastatin  (ZOCOR ) 20 MG tablet TAKE 1 TABLET BY MOUTH EVERYDAY AT BEDTIME 08/16/23  Yes Levora Matthew SAUNDERS, MD  TECHLITE PLUS PEN NEEDLES 32G X 4 MM MISC USE ONCE DAILY WITH LANTUS . DX E11.9, Z79.4 12/07/22  Yes Levora Matthew SAUNDERS, MD  tirzepatide  (MOUNJARO ) 7.5 MG/0.5ML Pen Inject 7.5 mg into the skin once a week. 07/28/23  Yes Levora Matthew SAUNDERS, MD  traMADol  (ULTRAM ) 50 MG tablet Take 50 mg by mouth daily as needed for severe pain.   Yes [provider]  traZODone  (DESYREL ) 100 MG tablet Take 1 tablet (100 mg total) by mouth at bedtime as needed for sleep. 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  Tobacco Use   Smoking status: Former    Current packs/day: 0.00    Average packs/day: 2.0 packs/day for 26.0 years (52.0 ttl pk-yrs)    Types: Cigarettes    Start date: 08/12/1977    Quit date: 08/13/2003    Years since quitting: 20.0   Smokeless tobacco: Never   Tobacco comments:    2ppd x 26 years  Vaping Use   Vaping status: Never Used  Substance and Sexual Activity   Alcohol  use: No    Alcohol /week: 0.0 standard drinks of alcohol     Comment: social   Drug use: No   Sexual activity: Yes    Partners: Male    Birth control/protection: None  Other Topics Concern   Not on file  Social History Narrative   Married. Education: college. Exercise: No.   Social Drivers of Health   Financial Resource Strain: Low Risk  (07/18/2023)   Overall Financial Resource Strain (CARDIA)    Difficulty of Paying Living  Expenses: Not hard at all  Food Insecurity: No Food Insecurity (07/18/2023)  Hunger Vital Sign    Worried About Running Out of Food in the Last Year: Never true    Ran Out of Food in the Last Year: Never true  Transportation Needs: No Transportation Needs (07/18/2023)   PRAPARE - Administrator, Civil Service (Medical): No    Lack of Transportation (Non-Medical): No  Physical Activity: Sufficiently Active (07/18/2023)   Exercise Vital Sign    Days of Exercise per Week: 7 days    Minutes of Exercise per Session: 60 min  Stress: Stress Concern Present (07/18/2023)   [employer] of Occupational Health - Occupational Stress Questionnaire    Feeling of Stress : To some extent  Social Connections: Moderately Isolated (07/18/2023)   Social Connection and Isolation Panel [NHANES]    Frequency of Communication with Friends and Family: More than three times a week    Frequency of Social Gatherings with Friends and Family: Never    Attends Religious Services: Never    Database Administrator or Organizations: No    Attends Engineer, Structural: Not on file    Marital Status: Married  Catering Manager Violence: Not on file    Review of Systems   Objective:   Vitals:   08/18/23 1516  BP: 126/74  Pulse: 79  Temp: 98.6 F (37 C)  TempSrc: Temporal  SpO2: 95%  Weight: (!) 313 lb 8 oz (142.2 kg)  Height: 6' 2 (1.88 m)     Physical Exam Vitals reviewed.  Constitutional:      Appearance: He is well-developed.  HENT:     Head: Normocephalic and atraumatic.  Neck:     Vascular: No carotid bruit or JVD.  Cardiovascular:     Rate and Rhythm: Normal rate and regular rhythm.     Heart sounds: Normal heart sounds. No murmur heard. Pulmonary:     Effort: Pulmonary effort is normal.     Breath sounds: Wheezing (End expiratory, left greater than right wheezing.  Speak in full sentences.) present. No rales.  Musculoskeletal:     Right lower leg: No edema.     Left  lower leg: No edema.  Skin:    General: Skin is warm and dry.  Neurological:     Mental Status: He is alert and oriented to person, place, and time.  Psychiatric:        Mood and Affect: Mood normal.        Assessment & Plan:  Matthew Hutchinson is a 63 y.o. male . Cough, unspecified type - Plan: Fluticasone  Furoate (ARNUITY ELLIPTA ) 100 MCG/ACT AEPB Influenza-like illness Obesity hypoventilation syndrome (HCC) Chronic obstructive pulmonary disease, unspecified COPD type (HCC) - Plan: Fluticasone  Furoate (ARNUITY ELLIPTA ) 100 MCG/ACT AEPB  -Overall symptom improvement from likely influenza or influenza-like illness.  Still some residual cough, wheezing.  Chart reviewed with prior diagnosis of obesity hypoventilation syndrome as well as some component of COPD.  On review of his previous pulmonary notes, Arnuity had been helpful as well as Singulair.  Will start with Arnuity, consider adding Singulair if that is ineffective or does not tolerate.  RTC precautions.  Hypoglycemia Type 2 diabetes mellitus with hyperglycemia, with long-term current use of insulin  (HCC)  -Well-controlled by last A1c, but some of that is related to his hypoglycemia.  It may be best to come off insulin  given his infrequent meals and risk of hypoglycemia as above.  Will further decrease his insulin  to 5 units for now with option to stop insulin  if  any further low readings, continue Mounjaro , metformin  as above.  Recheck A1c in 2 months with RTC precautions, especially if any recurrent hypoglycemia.   Meds ordered this encounter  Medications   Fluticasone  Furoate (ARNUITY ELLIPTA ) 100 MCG/ACT AEPB    Sig: Inhale 100 mcg into the lungs daily.    Dispense:  30 each    Refill:  1   Patient Instructions  Drop semglee  to 5 units. If any further lows, stop insulin  for now. Continue mounjaro  same dose for now.  I am hoping that we can get you off insulin  as the Mounjaro  seems to be working very well.  Keep up the  good work.  I do still hear some wheeze today.  I suspect that some of the underlying COPD may still be an issue with the recent infection.  On review of previous notes from Dr. Neysa, Arnuity Ellipta  inhaler was helpful in the past.  I will send that to your pharmacy.  Another medication called Singulair was also helpful in the past which we could try if the Arnuity does not work.  Keep me posted.  Follow-up in 2 months, sooner if needed.  Take care!    Signed,   Matthew Pines, MD [facility], [facility] 08/18/23 4:15 PM

## 2023-08-18 NOTE — Patient Instructions (Addendum)
 Drop semglee  to 5 units. If any further lows, stop insulin  for now. Continue mounjaro  same dose for now.  I am hoping that we can get you off insulin  as the Mounjaro  seems to be working very well.  Keep up the good work.  I do still hear some wheeze today.  I suspect that some of the underlying COPD may still be an issue with the recent infection.  On review of previous notes from Dr. Neysa, Arnuity Ellipta  inhaler was helpful in the past.  I will send that to your pharmacy.  Another medication called Singulair was also helpful in the past which we could try if the Arnuity does not work.  Keep me posted.  Follow-up in 2 months, sooner if needed.  Take care!

## 2023-08-23 ENCOUNTER — Ambulatory Visit: Payer: BC Managed Care – PPO | Admitting: Podiatry

## 2023-08-23 ENCOUNTER — Encounter: Payer: Self-pay | Admitting: Podiatry

## 2023-08-23 DIAGNOSIS — E1165 Type 2 diabetes mellitus with hyperglycemia: Secondary | ICD-10-CM

## 2023-08-23 DIAGNOSIS — M79675 Pain in left toe(s): Secondary | ICD-10-CM

## 2023-08-23 DIAGNOSIS — Z794 Long term (current) use of insulin: Secondary | ICD-10-CM

## 2023-08-23 DIAGNOSIS — B351 Tinea unguium: Secondary | ICD-10-CM

## 2023-08-23 DIAGNOSIS — M79674 Pain in right toe(s): Secondary | ICD-10-CM

## 2023-08-23 NOTE — Progress Notes (Signed)
This patient returns to my office for at risk foot care.  This patient requires this care by a professional since this patient will be at risk due to having diabetes. This patient says his nails are painful in his 2,3 toes both feet.  He says the nails curve over the tip of his toes during walking. This patient presents for at risk foot care today.  General Appearance  Alert, conversant and in no acute stress.  Vascular  Dorsalis pedis and posterior tibial  pulses are palpable  bilaterally.  Capillary return is within normal limits  bilaterally. Temperature is within normal limits  bilaterally.  Neurologic  Senn-Weinstein monofilament wire test within normal limits  bilaterally. Muscle power within normal limits bilaterally.  Nails Thick disfigured discolored nails with subungual debris  from hallux to fifth toes bilaterally. No evidence of bacterial infection or drainage bilaterally.  Orthopedic  No limitations of motion  feet .  No crepitus or effusions noted.  No bony pathology or digital deformities noted. Hammer toes  2-4  B/L.  Skin  normotropic skin with no porokeratosis noted bilaterally.  No signs of infections or ulcers noted.     Hammer toes 2-4  B/L.    Consent was obtained for treatment procedures.   Debride nails with nail nipper followed by dremel tool.  RTC 9 weeks.     Return office visit    9 weeks                  Told patient to return for periodic foot care and evaluation due to potential at risk complications.   Helane Gunther DPM

## 2023-08-25 ENCOUNTER — Other Ambulatory Visit: Payer: Self-pay | Admitting: Family Medicine

## 2023-08-25 DIAGNOSIS — E1165 Type 2 diabetes mellitus with hyperglycemia: Secondary | ICD-10-CM

## 2023-08-25 DIAGNOSIS — G4733 Obstructive sleep apnea (adult) (pediatric): Secondary | ICD-10-CM | POA: Diagnosis not present

## 2023-08-25 NOTE — Telephone Encounter (Signed)
Patient needs PA.Marland Kitchen

## 2023-08-30 ENCOUNTER — Encounter: Payer: Self-pay | Admitting: Family Medicine

## 2023-08-30 ENCOUNTER — Other Ambulatory Visit: Payer: Self-pay | Admitting: Family Medicine

## 2023-08-30 MED ORDER — FREESTYLE LIBRE 3 SENSOR MISC: 0 refills | Status: DC

## 2023-08-30 NOTE — Telephone Encounter (Signed)
Sent Sensors as requested

## 2023-08-30 NOTE — Telephone Encounter (Signed)
PA team can we check if patient needs a new PA for 2025 for medication?

## 2023-09-01 ENCOUNTER — Telehealth: Payer: Self-pay | Admitting: Pharmacy Technician

## 2023-09-01 ENCOUNTER — Other Ambulatory Visit (HOSPITAL_COMMUNITY): Payer: Self-pay

## 2023-09-01 NOTE — Telephone Encounter (Signed)
Pharmacy Patient Advocate Encounter  Insurance verification completed.   The patient is insured through [facility]   Ran test claim for Semglee (yfgn) 100 Unit/ML Pen. Currently a quantity of 15 mL is a 25 day supply and the co-pay is $35.00 . No PA needed.  This test claim was processed through [facility]- copay amounts may vary at other pharmacies due to pharmacy/plan contracts, or as the patient moves through the different stages of their insurance plan.

## 2023-09-01 NOTE — Telephone Encounter (Signed)
PA request has been Cancelled. New Encounter created for follow up. For additional info see Pharmacy Prior Auth telephone encounter from 09/01/2023.

## 2023-09-06 ENCOUNTER — Other Ambulatory Visit: Payer: Self-pay | Admitting: Family Medicine

## 2023-09-06 DIAGNOSIS — G8929 Other chronic pain: Secondary | ICD-10-CM

## 2023-09-07 DIAGNOSIS — L245 Irritant contact dermatitis due to other chemical products: Secondary | ICD-10-CM | POA: Diagnosis not present

## 2023-09-07 DIAGNOSIS — L720 Epidermal cyst: Secondary | ICD-10-CM | POA: Diagnosis not present

## 2023-09-11 ENCOUNTER — Other Ambulatory Visit: Payer: Self-pay | Admitting: Family Medicine

## 2023-09-11 DIAGNOSIS — R059 Cough, unspecified: Secondary | ICD-10-CM

## 2023-09-11 DIAGNOSIS — J449 Chronic obstructive pulmonary disease, unspecified: Secondary | ICD-10-CM

## 2023-09-12 ENCOUNTER — Ambulatory Visit (INDEPENDENT_AMBULATORY_CARE_PROVIDER_SITE_OTHER): Payer: BC Managed Care – PPO | Admitting: Licensed Clinical Social Worker

## 2023-09-12 DIAGNOSIS — F411 Generalized anxiety disorder: Secondary | ICD-10-CM | POA: Diagnosis not present

## 2023-09-12 DIAGNOSIS — F330 Major depressive disorder, recurrent, mild: Secondary | ICD-10-CM | POA: Diagnosis not present

## 2023-09-12 NOTE — Progress Notes (Signed)
 THERAPIST PROGRESS NOTE   Session Date: 09/12/2023  Session Time: 1509 - 1603 Virtual Visit via Video Note  I connected with Matthew Hutchinson on 09/12/23 at  3:09 PM EST by a video enabled telemedicine application and verified that I am speaking with the correct person using two identifiers.  Location: Patient: In car Provider: Home Office   I discussed the limitations of evaluation and management by telemedicine and the availability of in person appointments. The patient expressed understanding and agreed to proceed.  I discussed the assessment and treatment plan with the patient. The patient was provided an opportunity to ask questions and all were answered. The patient agreed with the plan and demonstrated an understanding of the instructions.   The patient was advised to call back or seek an in-person evaluation if the symptoms worsen or if the condition fails to improve as anticipated.  I provided 54 minutes of non-face-to-face time during this encounter.  Participation Level: Active  Behavioral Response: CasualAlertAnxious  Type of Therapy: Individual Therapy  Treatment Goals addressed:  - LTG: Reduce frequency, intensity, and duration of depression symptoms so that daily functioning is improved (OP Depression) - LTG: Increase coping skills to manage depression and improve ability to perform daily activities (OP Depression) - STG: Reduce overall depression score by a minimum of 25% on the Patient Health Questionnaire (PHQ-9) (OP Depression) - STG: Rigby will identify cognitive patterns and beliefs that support depression (OP Depression) - STG: Mychal will reduce frequency of avoidant behaviors by 50% as evidenced by self-report in therapy sessions (Anxiety) - LTG: "Continue to keep depression manageable when faced with the inevitable horribleness" (OP Depression) - LTG: "Maintain abilities at managing anxiety and have minimal panic attacks" (Anxiety)  ProgressTowards  Goals: Initial  Interventions: CBT, Motivational Interviewing, and Supportive  Summary: Matthew Hutchinson is a 63 y.o. male with past psych history of MDD and GAD, presenting for follow-up therapy session in efforts to improve management of depressive and anxious symptoms. Patient actively engaged in session, presenting in anxious moods and congruent affect throughout session.  - Pt openly engaged, sharing details of the past month, expressing "Everything is horrible", further sharing of spouses medical complications continuing to become progressively worse over the last month, noting of additional stressors related to partners health being of partner being admitted to hospital for the past month, as a result of repeated infections, multiple strokes, weakened strength, and compromised functioning. Pt shared of additional factors proving to exacerbate stress, specifically in relation to medical coverages denial of claims and increased out of pocket expenses for partners medical needs. Pt detailed of inabilities to focus on individual presenting needs due to increased presenting needs of partner, business stressors, and general expression of being overwhelmed with all stressors. Pt shared of various factors he and spouse are proving to consider in efforts to allow them to secure adequate coverage for partner, and potential contributing factors to such decisions. Actively engaged in reassessment of depressive and anxious sxs via PHQ-9 and GAD-7, reviewing current scores, comparing to previous scores, and variances in consideration to contributing factors.   Patient responded well to interventions. Patient continues to meet criteria for MDD and GAD. Patient will continue to benefit from engagement in outpatient therapy due to being the least restrictive service to meet presenting needs.      09/12/2023    3:54 PM 08/15/2023    3:15 PM 07/21/2023    3:41 PM 06/08/2023    1:29 PM 01/31/2023    4:15 PM  Depression screen  PHQ 2/9  Decreased Interest 0 0 1 0 1  Down, Depressed, Hopeless 3 3 2 1 1   PHQ - 2 Score 3 3 3 1 2   Altered sleeping 3 0 1 0 1  Tired, decreased energy 1 3 1 1 1   Change in appetite 1 0 0 1 2  Feeling bad or failure about yourself  1 3 1 1  0  Trouble concentrating 3 3 0 1 0  Moving slowly or fidgety/restless 3 2 1  0 0  Suicidal thoughts 0 0 0 0 0  PHQ-9 Score 15 14 7 5 6   Difficult doing work/chores Very difficult Extremely dIfficult  Somewhat difficult    Flowsheet Row Counselor from 11/24/2022 in [facility] from 10/05/2022 in [facility] at [facility] from 08/04/2022 in [facility] at [facility] RISK CATEGORY Low Risk Low Risk Low Risk         09/12/2023    3:51 PM 08/15/2023    3:14 PM 07/21/2023    3:41 PM 06/08/2023    1:26 PM  GAD 7 : Generalized Anxiety Score  Nervous, Anxious, on Edge 3 1 1 3   Control/stop worrying 3 3 3 3   Worry too much - different things 1 3 2 3   Trouble relaxing 3 3 3 3   Restless 1 0 1 3  Easily annoyed or irritable 3 3 2 3   Afraid - awful might happen 3 3 3 3   Total GAD 7 Score 17 16 15 21   Anxiety Difficulty Very difficult Extremely difficult  Somewhat difficult     Suicidal/Homicidal: Nowithout intent/plan  Therapist Response: Clinician utilized CBT, MI, and supportive reflection interventions to address presenting sxs.  Clinician actively engaged pt in check-in, assessing mood and affect, engaging pt in exploration of recent events, actively listening to pt's recounts of recent stressors and challenges. Elicited pt's thoughts and feelings surrounding recent continued influx in stressors, utilizing socratic questioning in efforts to support pt in navigating thoughts and feelings surrounding presenting challenges. Supported pt in validating feelings surrounding presenting stressors. Reassessed presenting depressive and  anxious sxs via PHQ-9 and GAD-7.  Clinician reassessed severity of presenting sxs, and presence of any safety concerns. Therapist provided support and empathy to patient during session.  Plan: Return again in 4 weeks.  Diagnosis:  Encounter Diagnoses  Name Primary?   Major depressive disorder, recurrent episode, mild (HCC) Yes   GAD (generalized anxiety disorder)      Collaboration of Care: Other none necessary at this time.  Patient/Guardian was advised Release of Information must be obtained prior to any record release in order to collaborate their care with an outside provider. Patient/Guardian was advised if they have not already done so to contact the registration department to sign all necessary forms in order for [place] to release information regarding their care.   Consent: Patient/Guardian gives verbal consent for treatment and assignment of benefits for services provided during this visit. Patient/Guardian expressed understanding and agreed to proceed.   Leisa Lenz, MSW, LCSW 09/12/2023,  7:04 PM

## 2023-09-19 ENCOUNTER — Encounter: Payer: Self-pay | Admitting: Family Medicine

## 2023-09-20 DIAGNOSIS — G4733 Obstructive sleep apnea (adult) (pediatric): Secondary | ICD-10-CM | POA: Diagnosis not present

## 2023-09-27 ENCOUNTER — Encounter: Payer: Self-pay | Admitting: Family Medicine

## 2023-09-28 ENCOUNTER — Other Ambulatory Visit: Payer: Self-pay | Admitting: Family Medicine

## 2023-09-28 MED ORDER — TIRZEPATIDE 7.5 MG/0.5ML ~~LOC~~ SOAJ: 7.5000 mg | SUBCUTANEOUS | 1 refills | Status: DC

## 2023-10-04 ENCOUNTER — Ambulatory Visit (INDEPENDENT_AMBULATORY_CARE_PROVIDER_SITE_OTHER): Admitting: Licensed Clinical Social Worker

## 2023-10-04 DIAGNOSIS — F330 Major depressive disorder, recurrent, mild: Secondary | ICD-10-CM | POA: Diagnosis not present

## 2023-10-04 DIAGNOSIS — F411 Generalized anxiety disorder: Secondary | ICD-10-CM

## 2023-10-04 NOTE — Progress Notes (Unsigned)
 THERAPIST PROGRESS NOTE   Session Date: 10/04/2023  Session Time: 1510 - 1608 Virtual Visit via Video Note  I connected with Matthew Hutchinson on 10/04/23 at  3:10 PM EST by a video enabled telemedicine application and verified that I am speaking with the correct person using two identifiers.  Location: Patient: Home Provider: [facility] [place] [facility]   I discussed the limitations of evaluation and management by telemedicine and the availability of in person appointments. The patient expressed understanding and agreed to proceed.  I discussed the assessment and treatment plan with the patient. The patient was provided an opportunity to ask questions and all were answered. The patient agreed with the plan and demonstrated an understanding of the instructions.   The patient was advised to call back or seek an in-person evaluation if the symptoms worsen or if the condition fails to improve as anticipated.  I provided 58 minutes of non-face-to-face time during this encounter.  Participation Level: Active  Behavioral Response: CasualAlertAnxious  Type of Therapy: Individual Therapy  Treatment Goals addressed:  - LTG: Reduce frequency, intensity, and duration of depression symptoms so that daily functioning is improved (OP Depression) - LTG: Increase coping skills to manage depression and improve ability to perform daily activities (OP Depression) - STG: Reduce overall depression score by a minimum of 25% on the Patient Health Questionnaire (PHQ-9) (OP Depression) - STG: Wendall will identify cognitive patterns and beliefs that support depression (OP Depression) - STG: Zerick will reduce frequency of avoidant behaviors by 50% as evidenced by self-report in therapy sessions (Anxiety) - LTG: "Continue to keep depression manageable when faced with the inevitable horribleness" (OP Depression) - LTG: "Maintain abilities at managing anxiety and have minimal panic attacks"  (Anxiety)  ProgressTowards Goals: Not Progressing  Interventions: CBT, Motivational Interviewing, and Supportive  Summary: Matthew is a 63 y.o. male with past psych history of MDD and GAD, presenting for follow-up therapy session in efforts to improve management of depressive and anxious symptoms. Patient actively engaged in session, presenting in anxious moods and congruent affect throughout session.  -Patient openly engaged in introductory check-in, confirming of being at home, detailing of being between running around tending to care needs for spouse who has again been readmitted to the hospital for ongoing medical concerns.  Patient provided further recounts of recent events of the past 3 weeks since previous session, sharing of spouse having been discharged on Saturday, following a peer to peer review with spouses [facility] Medicare coverage regarding medical necessity for continued stay in hospital, having resulted in denial of continued care.  Patient detailed increased stress experienced over the weekend and into Monday, sharing of spouse having fell 3x since release, with EMS and fire attending home for support, and having sense returned to hospital due to having fallen multiple times and breaking of ankle again at time of last fall.  Patient actively processed thoughts and feelings surrounding presenting stressors, expressing of feeling incensed with coordination of care received through [facility] medicare coverage.  Actively processed impacts stress has and continues to have on patient, noting of anxious symptoms being increasingly prevalent, further proving to impact patient's sleep, resulting in minimal sleep due to having an abundance of tasks to navigate.  Further explored availability of supports, with patient noting of close friends proving to be supportive and maintaining frequent contact.  Patient expressed beliefs that past 3 months of constant crisis with husband's presenting medical conditions,  proving to exacerbate anxiety, has been the driving factor inabilities to  function.  Patient expressed feeling medications have proven to support throughout crisis and aid in maintaining level of functioning.   Patient responded well to interventions. Patient continues to meet criteria for MDD and GAD. Patient will continue to benefit from engagement in outpatient therapy due to being the least restrictive service to meet presenting needs.      09/12/2023    3:54 PM 08/15/2023    3:15 PM 07/21/2023    3:41 PM 06/08/2023    1:29 PM 01/31/2023    4:15 PM  Depression screen PHQ 2/9  Decreased Interest 0 0 1 0 1  Down, Depressed, Hopeless 3 3 2 1 1   PHQ - 2 Score 3 3 3 1 2   Altered sleeping 3 0 1 0 1  Tired, decreased energy 1 3 1 1 1   Change in appetite 1 0 0 1 2  Feeling bad or failure about yourself  1 3 1 1  0  Trouble concentrating 3 3 0 1 0  Moving slowly or fidgety/restless 3 2 1  0 0  Suicidal thoughts 0 0 0 0 0  PHQ-9 Score 15 14 7 5 6   Difficult doing work/chores Very difficult Extremely dIfficult  Somewhat difficult    Flowsheet Row Counselor from 11/24/2022 in [facility] from 10/05/2022 in [facility] from 08/04/2022 in [facility] at [facility] RISK CATEGORY Low Risk Low Risk Low Risk         09/12/2023    3:51 PM 08/15/2023    3:14 PM 07/21/2023    3:41 PM 06/08/2023    1:26 PM  GAD 7 : Generalized Anxiety Score  Nervous, Anxious, on Edge 3 1 1 3   Control/stop worrying 3 3 3 3   Worry too much - different things 1 3 2 3   Trouble relaxing 3 3 3 3   Restless 1 0 1 3  Easily annoyed or irritable 3 3 2 3   Afraid - awful might happen 3 3 3 3   Total GAD 7 Score 17 16 15 21   Anxiety Difficulty Very difficult Extremely difficult  Somewhat difficult     Suicidal/Homicidal: Nowithout intent/plan  Therapist Response: Clinician utilized CBT, MI,  and supportive reflection interventions to address presenting sxs.  Clinician actively engage patient in introductory check-in, assessing presenting mood and affect, further engaging patient in exploration of recent events and presenting stressors.  Actively listened to patient's recounts of events of the past 3 weeks, supporting patient in processing thoughts and feelings in relation to stress and overall level of functioning in current crisis.  Utilized CBT and psychoeducational interventions to support patient in processing thoughts, feelings, and greater understanding of stress tolerance and functioning within crisis.  Clinician reassessed severity of presenting sxs, and presence of any safety concerns. Therapist provided support and empathy to patient during session.  Plan: Return again in 4 weeks.  Diagnosis:  Encounter Diagnoses  Name Primary?   Major depressive disorder, recurrent episode, mild (HCC) Yes   GAD (generalized anxiety disorder)     Collaboration of Care: Other none necessary at this time.  Patient/Guardian was advised Release of Information must be obtained prior to any record release in order to collaborate their care with an outside provider. Patient/Guardian was advised if they have not already done so to contact the registration department to sign all necessary forms in order for [place] to release information regarding their care.   Consent: Patient/Guardian gives  verbal consent for treatment and assignment of benefits for services provided during this visit. Patient/Guardian expressed understanding and agreed to proceed.   Leisa Lenz, MSW, LCSW 10/04/2023,  3:11 PM

## 2023-10-19 ENCOUNTER — Ambulatory Visit: Payer: BC Managed Care – PPO | Admitting: Family Medicine

## 2023-10-24 ENCOUNTER — Ambulatory Visit (HOSPITAL_COMMUNITY): Admitting: Licensed Clinical Social Worker

## 2023-10-24 DIAGNOSIS — F411 Generalized anxiety disorder: Secondary | ICD-10-CM | POA: Diagnosis not present

## 2023-10-24 DIAGNOSIS — F330 Major depressive disorder, recurrent, mild: Secondary | ICD-10-CM

## 2023-10-24 NOTE — Progress Notes (Unsigned)
 THERAPIST PROGRESS NOTE   Session Date: 10/24/2023  Session Time: 1404 - 1455 Virtual Visit via Video Note  I connected with Matthew Hutchinson on 10/24/23 at  2:04 PM EST by a video enabled telemedicine application and verified that I am speaking with the correct person using two identifiers.  Location: Patient: Home Provider: Home Office   I discussed the limitations of evaluation and management by telemedicine and the availability of in person appointments. The patient expressed understanding and agreed to proceed.  I discussed the assessment and treatment plan with the patient. The patient was provided an opportunity to ask questions and all were answered. The patient agreed with the plan and demonstrated an understanding of the instructions.   The patient was advised to call back or seek an in-person evaluation if the symptoms worsen or if the condition fails to improve as anticipated.  I provided 51 minutes of non-face-to-face time during this encounter.  Participation Level: Active  Behavioral Response: CasualLethargicNegative and flat  Type of Therapy: Individual Therapy  Treatment Goals addressed:  - LTG: Reduce frequency, intensity, and duration of depression symptoms so that daily functioning is improved (OP Depression) - LTG: Increase coping skills to manage depression and improve ability to perform daily activities (OP Depression) - STG: Reduce overall depression score by a minimum of 25% on the Patient Health Questionnaire (PHQ-9) (OP Depression) - STG: Matthew Hutchinson will identify cognitive patterns and beliefs that support depression (OP Depression) - STG: Matthew Hutchinson will reduce frequency of avoidant behaviors by 50% as evidenced by self-report in therapy sessions (Anxiety) - LTG: "Continue to keep depression manageable when faced with the inevitable horribleness" (OP Depression) - LTG: "Maintain abilities at managing anxiety and have minimal panic attacks"  (Anxiety)  ProgressTowards Goals: Not Progressing  Interventions: CBT, Motivational Interviewing, and Supportive  Summary: Matthew Hutchinson is a 63 y.o. male with past psych history of MDD and GAD, presenting for follow-up therapy session in efforts to improve management of depressive and anxious symptoms.   Patient actively engaged in session, presenting in overall negative moods, flat affect, and lethargic throughout session. Pt openly engaged in introductory check-in, sharing of today having been a rough day, detailing of husband bumped foot in sleep, splitting surgical incision and having bled out believed to be a pint due to blood thinner medications. Pt endorsed feeling mentally and physically exhausted due to the continued management of spouses presenting medical needs due to insurance denials of 24 hour care for spouse.  Patient further detailed of he and spouse typically sleeping through the night and one in every 3-4 nights, sharing of finding these stressors to further be impacting business due to patient's inabilities to attend to business needs as a result of being the sole caregiver for partner, sharing of further frustrations surrounding partners overall resentment and disinterest in supporting self.  Actively processed alternative options for support such as long-term residential to support the management of presenting medical and mental health needs, prompting patient to contact [place] health Medicare to explore availability of care coordinator/manager, previously assigned Child psychotherapist with home health care team, and to contact [employer] to further explore availability of supports.  Patient acknowledged individual efforts at doing all he is capable of, expressing awareness of needing to tend to own presenting physical and mental health needs, however having no support to care for spouses more urgent needs.  Engaged in review of presenting depressive and anxious symptoms, determining it to be ineffective  to assess symptoms via PHQ-9 and GAD-7 due  to continued crisis surrounding spouses presenting needs.  Patient denied SI, HI or AVH.  Patient responded well to interventions. Patient continues to meet criteria for MDD and GAD. Patient will continue to benefit from engagement in outpatient therapy due to being the least restrictive service to meet presenting needs.      09/12/2023    3:54 PM 08/15/2023    3:15 PM 07/21/2023    3:41 PM 06/08/2023    1:29 PM 01/31/2023    4:15 PM  Depression screen PHQ 2/9  Decreased Interest 0 0 1 0 1  Down, Depressed, Hopeless 3 3 2 1 1   PHQ - 2 Score 3 3 3 1 2   Altered sleeping 3 0 1 0 1  Tired, decreased energy 1 3 1 1 1   Change in appetite 1 0 0 1 2  Feeling bad or failure about yourself  1 3 1 1  0  Trouble concentrating 3 3 0 1 0  Moving slowly or fidgety/restless 3 2 1  0 0  Suicidal thoughts 0 0 0 0 0  PHQ-9 Score 15 14 7 5 6   Difficult doing work/chores Very difficult Extremely dIfficult  Somewhat difficult    Flowsheet Row Counselor from 11/24/2022 in [facility] from 10/05/2022 in [facility] at [facility] from 08/04/2022 in [facility] at [facility] RISK CATEGORY Low Risk Low Risk Low Risk         09/12/2023    3:51 PM 08/15/2023    3:14 PM 07/21/2023    3:41 PM 06/08/2023    1:26 PM  GAD 7 : Generalized Anxiety Score  Nervous, Anxious, on Edge 3 1 1 3   Control/stop worrying 3 3 3 3   Worry too much - different things 1 3 2 3   Trouble relaxing 3 3 3 3   Restless 1 0 1 3  Easily annoyed or irritable 3 3 2 3   Afraid - awful might happen 3 3 3 3   Total GAD 7 Score 17 16 15 21   Anxiety Difficulty Very difficult Extremely difficult  Somewhat difficult     Suicidal/Homicidal: Nowithout intent/plan  Therapist Response: Clinician utilized CBT, MI, and supportive reflection interventions to address presenting  sxs.  Clinician actively greeted patient upon connection for visit, engaging in introductory check-in, assessing presenting mood and affect, further prompting patient to detail factors contributing to presenting moods, actively listening to patient's reported stressors experienced throughout the morning and last night.  Utilized open-ended questions to further elicit patient's recounts of recent events, actively listening to reports of past 3 weeks, supporting patient in processing thoughts and feelings surrounding continued crises and stress as it relates to being the sole caregiver for spouses significant presenting medical needs.  Utilized MI and psychoeducational interventions and supporting patient in processing potential opportunities of unexplored supports, supporting patient in navigating expressed thoughts and validating identified feelings experienced in relation to continued stress.  Clinician reassessed severity of presenting sxs, and presence of any safety concerns. Therapist provided support and empathy to patient during session.  Plan: Return again in 3 weeks.  Diagnosis:  Encounter Diagnoses  Name Primary?   Major depressive disorder, recurrent episode, mild (HCC) Yes   GAD (generalized anxiety disorder)      Collaboration of Care: Other none necessary at this time.  Patient/Guardian was advised Release of Information must be obtained prior to any record release in order to collaborate their care with an outside provider.  Patient/Guardian was advised if they have not already done so to contact the registration department to sign all necessary forms in order for us  to release information regarding their care.   Consent: Patient/Guardian gives verbal consent for treatment and assignment of benefits for services provided during this visit. Patient/Guardian expressed understanding and agreed to proceed.   Patsi Boots, MSW, LCSW 10/24/2023,  2:04 PM

## 2023-10-26 ENCOUNTER — Ambulatory Visit: Payer: BC Managed Care – PPO | Admitting: Podiatry

## 2023-11-03 ENCOUNTER — Telehealth (HOSPITAL_COMMUNITY): Payer: BC Managed Care – PPO | Admitting: Psychiatry

## 2023-11-03 ENCOUNTER — Encounter (HOSPITAL_COMMUNITY): Payer: Self-pay | Admitting: Psychiatry

## 2023-11-03 DIAGNOSIS — F330 Major depressive disorder, recurrent, mild: Secondary | ICD-10-CM

## 2023-11-03 DIAGNOSIS — F411 Generalized anxiety disorder: Secondary | ICD-10-CM

## 2023-11-03 MED ORDER — HYDROXYZINE PAMOATE 50 MG PO CAPS
ORAL_CAPSULE | ORAL | 0 refills | Status: DC
Start: 2023-11-03 — End: 2024-02-01

## 2023-11-03 MED ORDER — SERTRALINE HCL 100 MG PO TABS: ORAL_TABLET | ORAL | 0 refills | Status: DC

## 2023-11-03 MED ORDER — LAMOTRIGINE 200 MG PO TABS
200.0000 mg | ORAL_TABLET | Freq: Every day | ORAL | 0 refills | Status: DC
Start: 2023-11-03 — End: 2024-02-01

## 2023-11-03 NOTE — Progress Notes (Signed)
 [facility] MD Virtual Progress Note   Patient Location: Home Provider Location: Office  I connect with patient by video and verified that I am speaking with correct person by using two identifiers. I discussed the limitations of evaluation and management by telemedicine and the availability of in person appointments. I also discussed with the patient that there may be a patient responsible charge related to this service. The patient expressed understanding and agreed to proceed.  Matthew Hutchinson [id number] 63 y.o.  11/03/2023 2:39 PM  History of Present Illness:  Patient is evaluated by video session.  He reported chronic stress as Ambrosio Junker is now at home and patient is involved in taking care of him.  Patient told Ambrosio Junker is in the hospital multiple times since the last visit and now his doctors, nurses and physical therapy are coming home.  Patient told Ambrosio Junker cannot get out of the home because he is very sick.  Apparently he had seizure-like episodes patient believes due to mini stroke and low blood pressure.  Patient told he is trying to take him to neurology appointment but due to limited mobility he has not done so far.  Patient also told that he is very frustrated with the insurance because there is a lot of denial of appeal going to the rehab.  Patient had tried contacting a lawyer to get divorce so he can get benefit through the state but has not have success.  He is seeing Royston Cornea for therapy.  He feels the medicine working and he is sleeping good but does not want to take the trazodone  because it put him in a deep sleep and he is concerned that he need to be up if Ambrosio Junker needs some help.  He denies any mania, psychosis, hallucination.  He admitted frustration and irritability but symptoms are manageable.  He has no tremors shakes or any EPS.  He lost weight because he is walking and taking care of Ambrosio Junker.  He has at least 10,000 steps a day.  His hemoglobin A1c further dropped.  He  cut down his gabapentin  and insulin  and he feel it has required because of weight loss and better control of hemoglobin A1c.  He like to keep his current medication.  He has no rash, itching, tremors or shakes.  Past Psychiatric History: H/O inpatient at [facility] for overdose on Ambien .  H/O overdose on Ambien  2 other times but did not require inpatient treatment.  H/O of mood swing, impulsive behavior, speeding ticket, anger, road rage.  No history of psychosis or hallucination.    Outpatient Encounter Medications as of 11/03/2023  Medication Sig   amLODipine  (NORVASC ) 5 MG tablet Take 1 tablet (5 mg total) by mouth daily.   ARNUITY ELLIPTA  100 MCG/ACT AEPB INHALE 1 PUFF INTO THE LUNGS DAILY   aspirin  81 MG tablet Take 1 tablet (81 mg total) by mouth daily.   b complex vitamins tablet Take 1 tablet by mouth daily.   calcium  gluconate 500 MG tablet Take 500 mg by mouth daily.   Carboxymethylcellul-Glycerin (LUBRICATING EYE DROPS OP) Place 1 drop into both eyes daily as needed (dry eyes).   Cholecalciferol  (VITAMIN D ) 2000 UNITS tablet Take 1 tablet (2,000 Units total) by mouth daily.   Continuous Glucose Sensor (FREESTYLE LIBRE 3 SENSOR) MISC Apply as instructed, change every 10 days.   Continuous Glucose Sensor (FREESTYLE LIBRE 3 SENSOR) MISC APPLY AS INSTRUCTED, CHANGE EVERY 10 DAYS.   Continuous Glucose Sensor (FREESTYLE LIBRE 3 SENSOR) MISC APPLY  AS INSTRUCTED, CHANGE EVERY 14 DAYS   diclofenac (VOLTAREN) 75 MG EC tablet Take 1 tablet by mouth 2 (two) times daily with a meal.   gabapentin  (NEURONTIN ) 300 MG capsule TAKE 2 CAPSULES BY MOUTH 2 TIMES DAILY.   GARLIC PO Take 100 mg by mouth daily.   Glucagon  (GVOKE HYPOPEN  1-PACK) 1 MG/0.2ML SOAJ Inject 1 mg into the skin once as needed for up to 1 dose.   hydrOXYzine  (VISTARIL ) 50 MG capsule TAKE 2 CAPSULES BY MOUTH AT BEDTIME   insulin  glargine-yfgn (SEMGLEE , YFGN,) 100 UNIT/ML Pen INJECT 61 UNITS INTO THE SKIN DAILY   lamoTRIgine  (LAMICTAL ) 200  MG tablet Take 1 tablet (200 mg total) by mouth daily.   losartan -hydrochlorothiazide  (HYZAAR) 100-25 MG tablet Take 1 tablet by mouth daily.   Lutein  40 MG CAPS Take 40 mg by mouth at bedtime.   magnesium  gluconate (MAGONATE) 500 MG tablet Take 500 mg by mouth daily.   metFORMIN  (GLUCOPHAGE ) 500 MG tablet Take 1 tablet (500 mg total) by mouth 2 (two) times daily with a meal.   Multiple Vitamin (MULTIVITAMIN) capsule Take 1 capsule by mouth daily.   nitroGLYCERIN  (NITROSTAT ) 0.4 MG SL tablet Place 1 tablet (0.4 mg total) under the tongue every 5 (five) minutes as needed for chest pain. If chest pain not resolved, after 3 doses 5 minutes apart--call 911 (Patient taking differently: Place 0.4 mg under the tongue See admin instructions. Take 0.4 mg as needed for hiccups)   Potassium 99 MG TABS Take 99 mg by mouth at bedtime.   sertraline  (ZOLOFT ) 100 MG tablet TAKE 1 TABLET BY MOUTH DAILY   Shark Cartilage 740 MG CAPS Take 1 capsule by mouth 3 (three) times daily.   simvastatin  (ZOCOR ) 20 MG tablet TAKE 1 TABLET BY MOUTH EVERYDAY AT BEDTIME   TECHLITE PLUS PEN NEEDLES 32G X 4 MM MISC USE ONCE DAILY WITH LANTUS . DX E11.9, Z79.4   tirzepatide  (MOUNJARO ) 7.5 MG/0.5ML Pen Inject 7.5 mg into the skin once a week.   traMADol  (ULTRAM ) 50 MG tablet Take 50 mg by mouth daily as needed for severe pain.   traZODone  (DESYREL ) 100 MG tablet Take 1 tablet (100 mg total) by mouth at bedtime as needed for sleep.   No facility-administered encounter medications on file as of 11/03/2023.    No results found for this or any previous visit (from the past 2160 hours).   Psychiatric Specialty Exam: Physical Exam  Review of Systems  Weight (!) 313 lb (142 kg).There is no height or weight on file to calculate BMI.  General Appearance: Casual  Eye Contact:  Good  Speech:  Slow  Volume:  Increased  Mood:  Anxious and frustarted  Affect:  Congruent  Thought Process:  Goal Directed  Orientation:  Full (Time, Place,  and Person)  Thought Content:  Rumination  Suicidal Thoughts:  No  Homicidal Thoughts:  No  Memory:  Immediate;   Good Recent;   Good Remote;   Good  Judgement:  Intact  Insight:  Present  Psychomotor Activity:  Decreased  Concentration:  Concentration: Good and Attention Span: Good  Recall:  Good  Fund of Knowledge:  Good  Language:  Good  Akathisia:  No  Handed:  Right  AIMS (if indicated):     Assets:  Communication Skills Desire for Improvement Housing Transportation  ADL's:  Intact  Cognition:  WNL  Sleep:  uses CPAP       09/12/2023    3:54 PM 08/15/2023  3:15 PM 07/21/2023    3:41 PM 06/08/2023    1:29 PM 01/31/2023    4:15 PM  Depression screen PHQ 2/9  Decreased Interest 0 0 1 0 1  Down, Depressed, Hopeless 3 3 2 1 1   PHQ - 2 Score 3 3 3 1 2   Altered sleeping 3 0 1 0 1  Tired, decreased energy 1 3 1 1 1   Change in appetite 1 0 0 1 2  Feeling bad or failure about yourself  1 3 1 1  0  Trouble concentrating 3 3 0 1 0  Moving slowly or fidgety/restless 3 2 1  0 0  Suicidal thoughts 0 0 0 0 0  PHQ-9 Score 15 14 7 5 6   Difficult doing work/chores Very difficult Extremely dIfficult  Somewhat difficult     Assessment/Plan: Major depressive disorder, recurrent episode, mild (HCC) - Plan: hydrOXYzine  (VISTARIL ) 50 MG capsule, sertraline  (ZOLOFT ) 100 MG tablet, lamoTRIgine  (LAMICTAL ) 200 MG tablet  GAD (generalized anxiety disorder) - Plan: sertraline  (ZOLOFT ) 100 MG tablet  Discussed psychosocial stressors and living situation.  Patient is now a primary caretaker of Ambrosio Junker with the help of Dr. Auston Blush at home.  He is still trying to get medical divorced so Ambrosio Junker can get Medicaid and rehab.  Encouraged to continue therapy with Moses James.  Patient is not taking trazodone  and he will call us  if he needed the refill.  Continue Zoloft  100 mg daily, Lamictal  200 mg daily and hydroxyzine  50 mg twice a day.  Recommend to call us  back if is any question or any concern.  Follow-up in  3 months.   Follow Up Instructions:     I discussed the assessment and treatment plan with the patient. The patient was provided an opportunity to ask questions and all were answered. The patient agreed with the plan and demonstrated an understanding of the instructions.   The patient was advised to call back or seek an in-person evaluation if the symptoms worsen or if the condition fails to improve as anticipated.    Collaboration of Care: Other provider involved in patient's care AEB notes are available in epic to review  Patient/Guardian was advised Release of Information must be obtained prior to any record release in order to collaborate their care with an outside provider. Patient/Guardian was advised if they have not already done so to contact the registration department to sign all necessary forms in order for us  to release information regarding their care.   Consent: Patient/Guardian gives verbal consent for treatment and assignment of benefits for services provided during this visit. Patient/Guardian expressed understanding and agreed to proceed.     Total encounter time 22 minutes which includes face-to-face time, chart reviewed, care coordination, order entry and documentation during this encounter.   Note: This document was prepared by [employer] voice dictation technology and any errors that results from this process are unintentional.    Arturo Late, MD 11/03/2023

## 2023-11-06 ENCOUNTER — Other Ambulatory Visit: Payer: Self-pay | Admitting: Family Medicine

## 2023-11-06 DIAGNOSIS — E1165 Type 2 diabetes mellitus with hyperglycemia: Secondary | ICD-10-CM

## 2023-11-08 ENCOUNTER — Other Ambulatory Visit (HOSPITAL_COMMUNITY): Payer: Self-pay | Admitting: Psychiatry

## 2023-11-08 DIAGNOSIS — F330 Major depressive disorder, recurrent, mild: Secondary | ICD-10-CM

## 2023-11-11 DIAGNOSIS — Z794 Long term (current) use of insulin: Secondary | ICD-10-CM | POA: Diagnosis not present

## 2023-11-11 DIAGNOSIS — E118 Type 2 diabetes mellitus with unspecified complications: Secondary | ICD-10-CM | POA: Diagnosis not present

## 2023-11-12 ENCOUNTER — Other Ambulatory Visit: Payer: Self-pay | Admitting: Family Medicine

## 2023-11-12 DIAGNOSIS — I10 Essential (primary) hypertension: Secondary | ICD-10-CM

## 2023-11-15 ENCOUNTER — Ambulatory Visit (INDEPENDENT_AMBULATORY_CARE_PROVIDER_SITE_OTHER): Admitting: Licensed Clinical Social Worker

## 2023-11-15 DIAGNOSIS — F411 Generalized anxiety disorder: Secondary | ICD-10-CM | POA: Diagnosis not present

## 2023-11-15 DIAGNOSIS — F330 Major depressive disorder, recurrent, mild: Secondary | ICD-10-CM

## 2023-11-15 NOTE — Progress Notes (Unsigned)
 THERAPIST PROGRESS NOTE   Session Date: 11/15/2023  Session Time: 1404 - 1508 Virtual Visit via Video Note  I connected with Matthew Hutchinson on 11/15/23 at  2:04 PM EST by a video enabled telemedicine application and verified that I am speaking with the correct person using two identifiers.  Location: Patient: Home Provider: Home Office   I discussed the limitations of evaluation and management by telemedicine and the availability of in person appointments. The patient expressed understanding and agreed to proceed.  I discussed the assessment and treatment plan with the patient. The patient was provided an opportunity to ask questions and all were answered. The patient agreed with the plan and demonstrated an understanding of the instructions.   The patient was advised to call back or seek an in-person evaluation if the symptoms worsen or if the condition fails to improve as anticipated.  I provided 63 minutes of non-face-to-face time during this encounter.  Participation Level: Active  Behavioral Response: CasualLethargicNegative and flat  Type of Therapy: Individual Therapy  Treatment Goals addressed:  - LTG: Reduce frequency, intensity, and duration of depression symptoms so that daily functioning is improved (OP Depression) - LTG: Increase coping skills to manage depression and improve ability to perform daily activities (OP Depression) - STG: Reduce overall depression score by a minimum of 25% on the Patient Health Questionnaire (PHQ-9) (OP Depression) - STG: Dow will identify cognitive patterns and beliefs that support depression (OP Depression) - STG: Calven will reduce frequency of avoidant behaviors by 50% as evidenced by self-report in therapy sessions (Anxiety) - LTG: "Continue to keep depression manageable when faced with the inevitable horribleness" (OP Depression) - LTG: "Maintain abilities at managing anxiety and have minimal panic attacks"  (Anxiety)  ProgressTowards Goals: Not Progressing  Interventions: CBT, Motivational Interviewing, and Supportive  Summary: Matthew Hutchinson is a 63 y.o. male with past psych history of MDD and GAD, presenting for follow-up therapy session in efforts to improve management of depressive and anxious symptoms.   Patient actively engaged in session, presenting in overall negative moods, flat affect throughout session. Pt openly engaged in introductory check-in, sharing of "life sucks and then you die", proving to attempt to utilize comic relief to alleviate presenting stress surrounding spouses medical needs. Pt further reports spouses continued medical decline proving to be increasingly stressful and returning back to ED being admitted again to Ortho due to exposed mechanical hardware from prior surgeries. Finding negative factors from stress contributing to pt's abilities to maintain medication adherence and ensuring that he's eating, sharing of having been setting alarms to remind himself. 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 Further explored continued challenges with all systems involved in spouses care including medical providers, insurance coverage, home health team, and others involved.  Patient responded well to interventions. Patient continues to meet criteria for MDD and GAD. Patient will continue to benefit from engagement in outpatient therapy due to being the least restrictive service to meet presenting needs.      11/15/2023    3:55 PM 09/12/2023    3:54 PM 08/15/2023    3:15 PM 07/21/2023    3:41 PM 06/08/2023    1:29 PM  Depression screen PHQ 2/9  Decreased Interest 0 0 0 1 0  Down, Depressed, Hopeless 3 3 3 2 1   PHQ - 2 Score 3 3 3 3 1   Altered sleeping 3 3 0 1 0  Tired, decreased energy  3 1 3 1 1   Change in appetite 3 1 0 0 1  Feeling bad or failure about yourself  3 1 3 1 1   Trouble  concentrating 3 3 3  0 1  Moving slowly or fidgety/restless 3 3 2 1  0  Suicidal thoughts 3 0 0 0 0  PHQ-9 Score 24 15 14 7 5   Difficult doing work/chores Extremely dIfficult Very difficult Extremely dIfficult  Somewhat difficult      11/15/2023    3:54 PM 09/12/2023    3:51 PM 08/15/2023    3:14 PM 07/21/2023    3:41 PM  GAD 7 : Generalized Anxiety Score  Nervous, Anxious, on Edge 3 3 1 1   Control/stop worrying 3 3 3 3   Worry too much - different things 3 1 3 2   Trouble relaxing 3 3 3 3   Restless 3 1 0 1  Easily annoyed or irritable 3 3 3 2   Afraid - awful might happen 3 3 3 3   Total GAD 7 Score 21 17 16 15   Anxiety Difficulty Extremely difficult Very difficult Extremely difficult    Suicidal/Homicidal: Yeswithout intent/plan. pSI, denying plan, intent, or means.   Therapist Response: Clinician utilized CBT, MI, and supportive reflection interventions to address presenting sxs.  Clinician actively greeted patient upon connection for visit, engaging in introductory check-in, assessing presenting mood and affect, eliciting pt's reflections of events and factors contributing to presenting moods. ***   actively listening to patient's reported stressors experienced throughout the morning and last night.  Utilized open-ended questions to further elicit patient's recounts of recent events, actively listening to reports of past 3 weeks, supporting patient in processing thoughts and feelings surrounding continued crises and stress as it relates to being the sole caregiver for spouses significant presenting medical needs.  Utilized MI and psychoeducational interventions and supporting patient in processing potential opportunities of unexplored supports, supporting patient in navigating expressed thoughts and validating identified feelings experienced in relation to continued stress.  Clinician reassessed severity of presenting sxs, and presence of any safety concerns. Therapist provided support and  empathy to patient during session.  Plan: Return again in 3 weeks.  Diagnosis:  Encounter Diagnoses  Name Primary?   GAD (generalized anxiety disorder) Yes   Major depressive disorder, recurrent episode, mild (HCC)     Collaboration of Care: Other none necessary at this time.  Patient/Guardian was advised Release of Information must be obtained prior to any record release in order to collaborate their care with an outside provider. Patient/Guardian was advised if they have not already done so to contact the registration department to sign all necessary forms in order for us  to release information regarding their care.   Consent: Patient/Guardian gives verbal consent for treatment and assignment of benefits for services provided during this visit. Patient/Guardian expressed understanding and agreed to proceed.   Patsi Boots, MSW, LCSW 11/15/2023,  3:58 PM

## 2023-11-19 ENCOUNTER — Other Ambulatory Visit: Payer: Self-pay | Admitting: Family Medicine

## 2023-11-19 DIAGNOSIS — E1165 Type 2 diabetes mellitus with hyperglycemia: Secondary | ICD-10-CM

## 2023-11-28 ENCOUNTER — Other Ambulatory Visit: Payer: Self-pay | Admitting: Family Medicine

## 2023-11-29 DIAGNOSIS — Z1211 Encounter for screening for malignant neoplasm of colon: Secondary | ICD-10-CM | POA: Insufficient documentation

## 2023-11-29 DIAGNOSIS — R1310 Dysphagia, unspecified: Secondary | ICD-10-CM | POA: Insufficient documentation

## 2023-11-29 DIAGNOSIS — Z13228 Encounter for screening for other metabolic disorders: Secondary | ICD-10-CM | POA: Insufficient documentation

## 2023-11-29 DIAGNOSIS — R1013 Epigastric pain: Secondary | ICD-10-CM | POA: Insufficient documentation

## 2023-11-30 ENCOUNTER — Ambulatory Visit: Admitting: Family Medicine

## 2023-11-30 ENCOUNTER — Encounter: Payer: Self-pay | Admitting: Family Medicine

## 2023-11-30 VITALS — BP 122/66 | HR 79 | Temp 98.1°F | Ht 74.0 in | Wt 291.0 lb

## 2023-11-30 DIAGNOSIS — Z23 Encounter for immunization: Secondary | ICD-10-CM

## 2023-11-30 DIAGNOSIS — E162 Hypoglycemia, unspecified: Secondary | ICD-10-CM | POA: Diagnosis not present

## 2023-11-30 DIAGNOSIS — G8929 Other chronic pain: Secondary | ICD-10-CM

## 2023-11-30 DIAGNOSIS — H538 Other visual disturbances: Secondary | ICD-10-CM | POA: Diagnosis not present

## 2023-11-30 DIAGNOSIS — H5712 Ocular pain, left eye: Secondary | ICD-10-CM

## 2023-11-30 DIAGNOSIS — M79605 Pain in left leg: Secondary | ICD-10-CM

## 2023-11-30 DIAGNOSIS — Z794 Long term (current) use of insulin: Secondary | ICD-10-CM

## 2023-11-30 DIAGNOSIS — I10 Essential (primary) hypertension: Secondary | ICD-10-CM

## 2023-11-30 DIAGNOSIS — E1165 Type 2 diabetes mellitus with hyperglycemia: Secondary | ICD-10-CM | POA: Diagnosis not present

## 2023-11-30 MED ORDER — GABAPENTIN 300 MG PO CAPS: ORAL_CAPSULE | ORAL | 1 refills | Status: DC

## 2023-11-30 MED ORDER — LOSARTAN POTASSIUM-HCTZ 100-25 MG PO TABS: 1.0000 | ORAL_TABLET | Freq: Every day | ORAL | 1 refills | Status: DC

## 2023-11-30 NOTE — Patient Instructions (Addendum)
 Thank you for coming in today.  I am sorry to hear that you are still having lows.  Stop insulin  at this time.  If you have any lows off of insulin  I need to know about that right away as we will possibly have you meet with an endocrinologist or change your diabetes medications further. Keep me posted.   Sorry to hear about the change in eye symptoms.  I did place an urgent referral to have you seen tomorrow if possible by [facility].  Staff should be calling you with an appointment soon.  If any acute worsening of symptoms tonight go to the emergency room.  Will recheck in 3 months, unless there are any new symptoms or changes, and if so please schedule separate appointment or let me know through MyChart.  Take care!

## 2023-11-30 NOTE — Progress Notes (Signed)
 Subjective:  Patient ID: Matthew Hutchinson, male    DOB: June 10, 1961  Age: 63 y.o. MRN: [id number]  CC:  Chief Complaint  Patient presents with   Diabetes    F/U; want to talk about libre sensor; no other concerns    HPI Matthew Hutchinson presents for   Diabetes: With history of hyperglycemia, obesity and hypoglycemia discussed previously.  He is on gabapentin  for chronic neuropathic leg pain. Mounjaro  5 mg weekly, metformin  500 mg twice daily and he was previously up to 61 units of Semglee  insulin , but with lower readings he adjusted his Semglee  dosage to 34 to 42 units for the previous few weeks at his January visit.  Hypo in the office is 69.  Infrequent meals have been discussed in the past as possible contributor, but he was eating snacks during the day to help minimize risk of hypoglycemia. Was having low readings on his CGM last visit. I significantly decreased his insulin  dose to 20 units/day with close monitoring for hyper or persistent hypoglycemia. GVoke hypopen  provided if needed for symptomatic severe hypoglycemia.  2-week follow-up was planned.  Follow-up on February 6, down to Semglee  10 units/day and had been off insulin  during the illness the week prior.  He was up to 7.5 mg of Mounjaro  at that time.  Had not used the glucagon .   Down to 5 units semglee  per day. No recent changes. On metformin  BID, monjuaro 7.5mg  weekly.  Very active - caring for spouse.   home readings - 90 day average. 96-139.  17 lows in past 3 months. Usually in middle of night, Only 5 in past 30 days.  1 in last 2 weeks. yesterday. 62 at midnight. Using glucose tabs, not glucagon . Over 250 once in past few months with coffee.  Semglee  at around 10am.1 main meal per day, some snacks at times.  Will be requesting new freestyle in next few months.  Has verified lows with fingerstick monitor.   Wt Readings from Last 3 Encounters:  11/30/23 291 lb (132 kg)  08/18/23 (!) 313 lb 8 oz  (142.2 kg)  07/21/23 (!) 325 lb (147.4 kg)   microalbumin: Due, normal ratio in December 2023.  Optho, foot exam, pneumovax:  Ophthalmology as above.  Pneumonia vaccine - prevnar 20 today.  Foot exam - due.   Immunization History  Administered Date(s) Administered   Hepatitis B 10/02/2010, 11/02/2010, 03/08/2011   Influenza Split 04/11/2012   Influenza Whole 05/12/2010   Influenza, Seasonal, Injecte, Preservative Fre 04/20/2023   Influenza,inj,Quad PF,6+ Mos 04/30/2013, 05/23/2014, 06/02/2015, 06/10/2016, 05/09/2018, 03/16/2019, 03/28/2020, 03/25/2021, 03/31/2022   PFIZER(Purple Top)SARS-COV-2 Vaccination 09/15/2019, 10/06/2019, 04/08/2020, 04/03/2021, 11/26/2021   Pfizer Covid Bivalent Pediatric Vaccine(87mos to <1yrs) 04/26/2022   Pfizer(Comirnaty)Fall Seasonal Vaccine 12 years and older 04/26/2022, 04/24/2023   Pneumococcal Conjugate-13 06/02/2015   Pneumococcal Polysaccharide-23 02/17/2009, 05/12/2010, 11/04/2017   Respiratory Syncytial Virus Vaccine,Recomb Aduvanted(Arexvy) 04/26/2022   Td 08/27/2004   Tdap 03/06/2015   Zoster Recombinant(Shingrix) 03/25/2021, 06/24/2021    Foot exam  Lab Results  Component Value Date   HGBA1C 6.1 07/21/2023   HGBA1C 6.6 (H) 04/20/2023   HGBA1C 7.1 (H) 09/29/2022   Lab Results  Component Value Date   MICROALBUR 3.7 (H) 06/30/2022   LDLCALC 58 07/21/2023   CREATININE 0.76 07/21/2023    History Patient Active Problem List   Diagnosis Date Noted   Dysphagia 11/29/2023   Encounter for screening for other metabolic disorders 11/29/2023   Screening for malignant neoplasm of colon 11/29/2023  Epigastric pain 11/29/2023   Major depressive disorder, recurrent episode, mild (HCC) 06/08/2023   GAD (generalized anxiety disorder) 06/08/2023   Pain due to onychomycosis of toenails of both feet 06/01/2023   Hammer toes of both feet 03/30/2023   Pain in joint of left shoulder 12/20/2022   Thoracic back pain 12/20/2022   Chronic pain of  left lower extremity 09/30/2022   Type 2 diabetes mellitus with hyperglycemia, with long-term current use of insulin  (HCC) 09/30/2022   Hiccups 04/01/2022   Newly diagnosed diabetes (HCC) 07/27/2018   Morbid obesity (HCC) 02/02/2017   Chronic pain due to trauma 06/25/2013   Depression, recurrent (HCC) 11/09/2012   CAD (coronary artery disease) 02/21/2012   Lipid disorder 02/21/2012   Angina pectoris (HCC) 09/27/2011   Obstructive sleep apnea 05/20/2010   Essential hypertension 04/17/2010   COPD mixed type (HCC) 04/17/2010   WEIGHT GAIN, ABNORMAL 04/17/2010   Past Medical History:  Diagnosis Date   Allergy    Anxiety    Arthritis    COPD (chronic obstructive pulmonary disease) (HCC)    Depression    Emphysema of lung (HCC)    Hyperlipidemia    Morbid obesity (HCC)    OSA (obstructive sleep apnea)    uses CPAP nightly   PONV (postoperative nausea and vomiting)    Unspecified essential hypertension    Past Surgical History:  Procedure Laterality Date   ESOPHAGOGASTRODUODENOSCOPY (EGD) WITH PROPOFOL  N/A 10/16/2021   Procedure: ESOPHAGOGASTRODUODENOSCOPY (EGD) WITH PROPOFOL ;  Surgeon: Alvis Jourdain, MD;  Location: [facility];  Service: Endoscopy;  Laterality: N/A;   INSERTION OF MESH N/A 09/18/2015   Procedure: INSERTION OF MESH;  Surgeon: Oza Blumenthal, MD;  Location: [facility];  Service: General;  Laterality: N/A;   LEG SURGERY Left    SAVORY DILATION N/A 10/16/2021   Procedure: SAVORY DILATION;  Surgeon: Alvis Jourdain, MD;  Location: [facility];  Service: Endoscopy;  Laterality: N/A;   SHOULDER ARTHROSCOPY Left    TONSILLECTOMY     UMBILICAL HERNIA REPAIR N/A 09/18/2015   Procedure: HERNIA REPAIR UMBILICAL ADULT WITH MESH ;  Surgeon: Oza Blumenthal, MD;  Location: [facility];  Service: General;  Laterality: N/A;   Allergies  Allergen Reactions   Penicillins Anaphylaxis   Valsartan Itching and Swelling   Prior to Admission medications    Medication Sig Start Date End Date Taking? Authorizing Provider  amLODipine  (NORVASC ) 5 MG tablet TAKE 1 TABLET (5 MG TOTAL) BY MOUTH DAILY. 11/14/23  Yes Benjiman Bras, MD  ARNUITY ELLIPTA  100 MCG/ACT AEPB INHALE 1 PUFF INTO THE LUNGS DAILY 09/12/23  Yes Benjiman Bras, MD  aspirin  81 MG tablet Take 1 tablet (81 mg total) by mouth daily. 11/13/12  Yes Lissa Riding, NP  b complex vitamins tablet Take 1 tablet by mouth daily.   Yes [provider]  calcium  gluconate 500 MG tablet Take 500 mg by mouth daily.   Yes [provider]  Carboxymethylcellul-Glycerin (LUBRICATING EYE DROPS OP) Place 1 drop into both eyes daily as needed (dry eyes).   Yes [provider]  Cholecalciferol  (VITAMIN D ) 2000 UNITS tablet Take 1 tablet (2,000 Units total) by mouth daily. 11/13/12  Yes Lissa Riding, NP  Continuous Glucose Sensor (FREESTYLE LIBRE 3 SENSOR) MISC Apply as instructed, change every 10 days. 08/30/23  Yes Benjiman Bras, MD  Continuous Glucose Sensor (FREESTYLE LIBRE 3 SENSOR) MISC APPLY AS INSTRUCTED, CHANGE EVERY 10 DAYS. 08/30/23  Yes Benjiman Bras, MD  Continuous Glucose Sensor (FREESTYLE LIBRE 3 SENSOR) MISC APPLY AS INSTRUCTED, CHANGE EVERY 14 DAYS 08/30/23  Yes Benjiman Bras, MD  diclofenac (VOLTAREN) 75 MG EC tablet Take 1 tablet by mouth 2 (two) times daily with a meal. 12/20/22  Yes [provider]  gabapentin  (NEURONTIN ) 300 MG capsule TAKE 2 CAPSULES BY MOUTH 2 TIMES DAILY. 09/06/23  Yes Benjiman Bras, MD  GARLIC PO Take 100 mg by mouth daily.   Yes [provider]  Glucagon  (GVOKE HYPOPEN  1-PACK) 1 MG/0.2ML SOAJ Inject 1 mg into the skin once as needed for up to 1 dose. 07/21/23  Yes Benjiman Bras, MD  hydrOXYzine  (VISTARIL ) 50 MG capsule TAKE 2 CAPSULES BY MOUTH AT BEDTIME 11/03/23  Yes Arfeen, Bronson Canny, MD  insulin  glargine-yfgn (SEMGLEE , YFGN,) 100 UNIT/ML Pen INJECT 61 UNITS INTO THE SKIN DAILY 11/21/23  Yes Benjiman Bras, MD   lamoTRIgine  (LAMICTAL ) 200 MG tablet Take 1 tablet (200 mg total) by mouth daily. 11/03/23  Yes Arfeen, Bronson Canny, MD  losartan -hydrochlorothiazide  (HYZAAR) 100-25 MG tablet Take 1 tablet by mouth daily. 04/20/23  Yes Benjiman Bras, MD  Lutein  40 MG CAPS Take 40 mg by mouth at bedtime.   Yes [provider]  magnesium  gluconate (MAGONATE) 500 MG tablet Take 500 mg by mouth daily.   Yes [provider]  metFORMIN  (GLUCOPHAGE ) 500 MG tablet TAKE 1 TABLET BY MOUTH 2 TIMES DAILY WITH A MEAL. 11/07/23  Yes Benjiman Bras, MD  Multiple Vitamin (MULTIVITAMIN) capsule Take 1 capsule by mouth daily. 11/13/12  Yes Lord, Wendelin Halsted, NP  nitroGLYCERIN  (NITROSTAT ) 0.4 MG SL tablet Place 1 tablet (0.4 mg total) under the tongue every 5 (five) minutes as needed for chest pain. If chest pain not resolved, after 3 doses 5 minutes apart--call 911 Patient taking differently: Place 0.4 mg under the tongue See admin instructions. Take 0.4 mg as needed for hiccups 11/13/12  Yes Lissa Riding, NP  Potassium 99 MG TABS Take 99 mg by mouth at bedtime.   Yes [provider]  sertraline  (ZOLOFT ) 100 MG tablet TAKE 1 TABLET BY MOUTH DAILY 11/03/23  Yes Arfeen, Bronson Canny, MD  Shark Cartilage 740 MG CAPS Take 1 capsule by mouth 3 (three) times daily.   Yes [provider]  simvastatin  (ZOCOR ) 20 MG tablet TAKE 1 TABLET BY MOUTH EVERYDAY AT BEDTIME 08/16/23  Yes Benjiman Bras, MD  TECHLITE PLUS PEN NEEDLES 32G X 4 MM MISC USE ONCE DAILY WITH LANTUS . DX E11.9, Z79.4 12/07/22  Yes Benjiman Bras, MD  tirzepatide  (MOUNJARO ) 7.5 MG/0.5ML Pen INJECT 7.5 MG SUBCUTANEOUSLY WEEKLY 11/28/23  Yes Benjiman Bras, MD  traMADol  (ULTRAM ) 50 MG tablet Take 50 mg by mouth daily as needed for severe pain.   Yes [provider]  traZODone  (DESYREL ) 100 MG tablet Take 1 tablet (100 mg total) by mouth at bedtime as needed for sleep. 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  Tobacco Use   Smoking status: Former    Current packs/day: 0.00    Average packs/day: 2.0 packs/day for 26.0 years (52.0 ttl pk-yrs)    Types: Cigarettes  Start date: 08/12/1977    Quit date: 08/13/2003    Years since quitting: 20.3   Smokeless tobacco: Never   Tobacco comments:    2ppd x 26 years  Vaping Use   Vaping status: Never Used  Substance and Sexual Activity   Alcohol  use: No    Alcohol /week: 0.0 standard drinks of alcohol     Comment: social   Drug use: No   Sexual activity: Yes    Partners: Male    Birth control/protection: None  Other Topics Concern   Not on file  Social History Narrative   Married. Education: college. Exercise: No.   Social Drivers of Health   Financial Resource Strain: Low Risk  (07/18/2023)   Overall Financial Resource Strain (CARDIA)    Difficulty of Paying Living Expenses: Not hard at all  Food Insecurity: No Food Insecurity (07/18/2023)   Hunger Vital Sign    Worried About Running Out of Food in the Last Year: Never true    Ran Out of Food in the Last Year: Never true  Transportation Needs: No Transportation Needs (07/18/2023)   PRAPARE - Administrator, Civil Service (Medical): No    Lack of Transportation (Non-Medical): No  Physical Activity: Sufficiently Active (07/18/2023)   Exercise Vital Sign    Days of Exercise per Week: 7 days    Minutes of Exercise per Session: 60 min  Stress: Stress Concern Present (07/18/2023)   [employer] of Occupational Health - Occupational Stress Questionnaire    Feeling of Stress : To some extent  Social Connections: Moderately Isolated (07/18/2023)   Social Connection and Isolation Panel [NHANES]    Frequency of Communication with Friends and Family: More  than three times a week    Frequency of Social Gatherings with Friends and Family: Never    Attends Religious Services: Never    Database administrator or Organizations: No    Attends Engineer, structural: Not on file    Marital Status: Married  Catering manager Violence: Not on file    Review of Systems  Constitutional:  Negative for fatigue and unexpected weight change.  Eyes:  Positive for visual disturbance (hx of floater, has been seen by optho - plans to d/w optho. occlusing about 1/3 of vision on outside of left eye usually, 1/2 now, large floater without retina issue. now with pain in left eye past few days, larger dark area today.).  Respiratory:  Negative for cough, chest tightness and shortness of breath.   Cardiovascular:  Negative for chest pain, palpitations and leg swelling.  Gastrointestinal:  Negative for abdominal pain and blood in stool.  Neurological:  Negative for dizziness, light-headedness and headaches.  Has seen provide with [facility] at [facility] prior. Told had large floater in past.    Objective:   Vitals:   11/30/23 1507  BP: 122/66  Pulse: 79  Temp: 98.1 F (36.7 C)  SpO2: 93%  Weight: 291 lb (132 kg)  Height: 6\' 2"  (1.88 m)     Physical Exam Vitals reviewed.  Constitutional:      Appearance: He is well-developed.  HENT:     Head: Normocephalic and atraumatic.  Neck:     Vascular: No carotid bruit or JVD.  Cardiovascular:     Rate and Rhythm: Normal rate and regular rhythm.     Heart sounds: Normal heart sounds. No murmur heard. Pulmonary:     Effort: Pulmonary effort is normal.     Breath sounds: Normal breath  sounds. No rales.  Musculoskeletal:     Right lower leg: No edema.     Left lower leg: No edema.  Skin:    General: Skin is warm and dry.  Neurological:     Mental Status: He is alert and oriented to person, place, and time.  Psychiatric:        Mood and Affect: Mood normal.        Assessment & Plan:   Matthew Hutchinson is a 63 y.o. male . Left eye pain - Plan: Ambulatory referral to Ophthalmology Blurred vision, left eye - Plan: Ambulatory referral to Ophthalmology  - Reports history of floater but has had some changes in symptoms as above including larger area of involvement and some discomfort.  Urgent referral placed to ophthalmology next day for evaluation.  Hypoglycemia Type 2 diabetes mellitus with hyperglycemia, with long-term current use of insulin  (HCC) - Plan: Comprehensive metabolic panel with GFR, Hemoglobin A1c, Microalbumin / creatinine urine ratio, CANCELED: Comprehensive metabolic panel with GFR, CANCELED: Hemoglobin A1c  - Still with lows, stop insulin .  Regular meals, Stacks discussed, if persistent lows off insulin  will need to adjust regimen further or possible endocrinology eval.  RTC/ER precautions given.  Chronic pain of left lower extremity - Plan: gabapentin  (NEURONTIN ) 300 MG capsule Stable, continue gabapentin   Essential hypertension - Plan: losartan -hydrochlorothiazide  (HYZAAR) 100-25 MG tablet Stable, continue losartan  HCTZ.  Labs as above  Immunization due - Plan: Pneumococcal conjugate vaccine 20-valent (Prevnar 20)   Meds ordered this encounter  Medications   gabapentin  (NEURONTIN ) 300 MG capsule    Sig: TAKE 2 CAPSULES BY MOUTH 2 TIMES DAILY.    Dispense:  360 capsule    Refill:  1   losartan -hydrochlorothiazide  (HYZAAR) 100-25 MG tablet    Sig: Take 1 tablet by mouth daily.    Dispense:  90 tablet    Refill:  1   Patient Instructions  Thank you for coming in today.  I am sorry to hear that you are still having lows.  Stop insulin  at this time.  If you have any lows off of insulin  I need to know about that right away as we will possibly have you meet with an endocrinologist or change your diabetes medications further. Keep me posted.   Sorry to hear about the change in eye symptoms.  I did place an urgent referral to have you seen tomorrow if  possible by [facility] eye care.  Staff should be calling you with an appointment soon.  If any acute worsening of symptoms tonight go to the emergency room.  Will recheck in 3 months, unless there are any new symptoms or changes, and if so please schedule separate appointment or let me know through MyChart.  Take care!     Signed,   Caro Christmas, MD [facility], [facility] 11/30/23 3:44 PM

## 2023-12-01 ENCOUNTER — Other Ambulatory Visit (INDEPENDENT_AMBULATORY_CARE_PROVIDER_SITE_OTHER)

## 2023-12-01 DIAGNOSIS — E1165 Type 2 diabetes mellitus with hyperglycemia: Secondary | ICD-10-CM

## 2023-12-01 DIAGNOSIS — H0102A Squamous blepharitis right eye, upper and lower eyelids: Secondary | ICD-10-CM | POA: Diagnosis not present

## 2023-12-01 DIAGNOSIS — H0102B Squamous blepharitis left eye, upper and lower eyelids: Secondary | ICD-10-CM | POA: Diagnosis not present

## 2023-12-01 DIAGNOSIS — Z794 Long term (current) use of insulin: Secondary | ICD-10-CM

## 2023-12-01 LAB — COMPREHENSIVE METABOLIC PANEL WITH GFR
ALT: 18 U/L (ref 0–53)
AST: 18 U/L (ref 0–37)
Albumin: 4.6 g/dL (ref 3.5–5.2)
Alkaline Phosphatase: 72 U/L (ref 39–117)
BUN: 11 mg/dL (ref 6–23)
CO2: 30 meq/L (ref 19–32)
Calcium: 9.4 mg/dL (ref 8.4–10.5)
Chloride: 101 meq/L (ref 96–112)
Creatinine, Ser: 0.9 mg/dL (ref 0.40–1.50)
GFR: 91.35 mL/min (ref >60.00)
Glucose, Bld: 92 mg/dL (ref 70–99)
Potassium: 3.8 meq/L (ref 3.5–5.1)
Sodium: 136 meq/L (ref 135–145)
Total Bilirubin: 0.9 mg/dL (ref 0.2–1.2)
Total Protein: 7.4 g/dL (ref 6.0–8.3)

## 2023-12-01 LAB — HEMOGLOBIN A1C: Hgb A1c MFr Bld: 5.8 % (ref 4.6–6.5)

## 2023-12-01 LAB — HM DIABETES EYE EXAM

## 2023-12-02 ENCOUNTER — Encounter: Payer: Self-pay | Admitting: Family Medicine

## 2023-12-02 LAB — MICROALBUMIN / CREATININE URINE RATIO
Creatinine,U: 153.1 mg/dL
Microalb Creat Ratio: 11.8 mg/g (ref 0.0–30.0)
Microalb, Ur: 1.8 mg/dL (ref 0.0–1.9)

## 2023-12-03 ENCOUNTER — Ambulatory Visit: Payer: Self-pay | Admitting: Family Medicine

## 2023-12-07 ENCOUNTER — Encounter (HOSPITAL_COMMUNITY): Payer: Self-pay

## 2023-12-13 ENCOUNTER — Ambulatory Visit (HOSPITAL_COMMUNITY): Admitting: Licensed Clinical Social Worker

## 2023-12-13 DIAGNOSIS — F411 Generalized anxiety disorder: Secondary | ICD-10-CM

## 2023-12-13 DIAGNOSIS — F330 Major depressive disorder, recurrent, mild: Secondary | ICD-10-CM | POA: Diagnosis not present

## 2023-12-13 NOTE — Progress Notes (Signed)
 THERAPIST PROGRESS NOTE   Session Date: 12/13/2023  Session Time: 1602 - 1704 Virtual Visit via Video Note  I connected with Matthew Hutchinson on 12/13/23 at  2:04 PM EST by a video enabled telemedicine application and verified that I am speaking with the correct person using two identifiers.  Location: Patient: Home Provider: Home Office   I discussed the limitations of evaluation and management by telemedicine and the availability of in person appointments. The patient expressed understanding and agreed to proceed.  I discussed the assessment and treatment plan with the patient. The patient was provided an opportunity to ask questions and all were answered. The patient agreed with the plan and demonstrated an understanding of the instructions.   The patient was advised to call back or seek an in-person evaluation if the symptoms worsen or if the condition fails to improve as anticipated.  I provided 62 minutes of non-face-to-face time during this encounter.  Participation Level: Active  Behavioral Response: CasualAlertEuthymic  Type of Therapy: Individual Therapy  Treatment Goals addressed:  - LTG: Reduce frequency, intensity, and duration of depression symptoms so that daily functioning is improved (OP Depression) - LTG: Increase coping skills to manage depression and improve ability to perform daily activities (OP Depression) - STG: Reduce overall depression score by a minimum of 25% on the Patient Health Questionnaire (PHQ-9) (OP Depression) - STG: Matthew Hutchinson will identify cognitive patterns and beliefs that support depression (OP Depression) - STG: Matthew Hutchinson will reduce frequency of avoidant behaviors by 50% as evidenced by self-report in therapy sessions (Anxiety) - LTG: "Continue to keep depression manageable when faced with the inevitable horribleness" (OP Depression) - LTG: "Maintain abilities at managing anxiety and have minimal panic attacks" (Anxiety)  ProgressTowards  Goals: Progressing  Interventions: CBT, Motivational Interviewing, and Supportive  Summary: Matthew Hutchinson is a 63 y.o. male with past psych history of MDD and GAD, presenting for follow-up therapy session in efforts to improve management of depressive and anxious symptoms.   Patient actively engaged in session, presenting in overall negative moods, flat affect throughout session. Pt openly engaged in introductory check-in, sharing of "Well, life sucks and then you die", again proving to attempt to utilize comic relief to alleviate presenting stress surrounding spouses continued presenting medical concerns. Pt further continued detailing events of the past month surrounding spouse having been discharged again from hospital, returning to rehab, attending specialist visit and presenting with HBP, being transferred to hospital and held for obs for 4 days resulting in being discharged from rehab. Shared optimism of providers having listened to pt recently surrounding spouses presenting needs, and having supported spouse in making necessary changes to partners psychotropic meds. Pt further processed recent frustrations with systems involved in provision of care for spouse, exploring thoughts, feelings, and perspectives in relation to continuous barriers faced in securing adequate care based on payor parameters and limitations. Engaged in reassessing presenting depressive and anxious sxs via PHQ-9 and GAD-7, reviewing reduction in both depressive and anxious sxs over the past month since previous visit, and further reflecting on individual efforts and managing sxs proving to be effective.  Patient responded well to interventions. Patient continues to meet criteria for MDD and GAD. Patient will continue to benefit from engagement in outpatient therapy due to being the least restrictive service to meet presenting needs.      12/13/2023    5:03 PM 11/30/2023    3:13 PM 11/15/2023    3:55 PM 09/12/2023    3:54 PM 08/15/2023  3:15 PM  Depression screen PHQ 2/9  Decreased Interest 1 1 0 0 0  Down, Depressed, Hopeless 1 3 3 3 3   PHQ - 2 Score 2 4 3 3 3   Altered sleeping 0 1 3 3  0  Tired, decreased energy 0 2 3 1 3   Change in appetite 0  3 1 0  Feeling bad or failure about yourself  1 3 3 1 3   Trouble concentrating 1 3 3 3 3   Moving slowly or fidgety/restless 0 2 3 3 2   Suicidal thoughts 0 0 3 0 0  PHQ-9 Score 4 15 24 15 14   Difficult doing work/chores Somewhat difficult Very difficult Extremely dIfficult Very difficult Extremely dIfficult      12/13/2023    5:02 PM 11/30/2023    3:14 PM 11/15/2023    3:54 PM 09/12/2023    3:51 PM  GAD 7 : Generalized Anxiety Score  Nervous, Anxious, on Edge 2 3 3 3   Control/stop worrying 2 3 3 3   Worry too much - different things 1 3 3 1   Trouble relaxing 1 3 3 3   Restless 1 3 3 1   Easily annoyed or irritable 2 3 3 3   Afraid - awful might happen 1 3 3 3   Total GAD 7 Score 10 21 21 17   Anxiety Difficulty Very difficult Very difficult Extremely difficult Very difficult   Suicidal/Homicidal: Nowithout intent/plan.   Therapist Response: Clinician utilized CBT, MI, and supportive reflection interventions to address presenting sxs.  Clinician actively greeted patient upon joining today's virtual visit, engaging in introductory check-in, assessing presenting moods and affect, and further prompting patient's reflection of recent events of the past month, and factors contributing to presenting moods.  Actively listened to patient's recounts of recent events of the past month and factors contributing to presenting moods, specifically in relation to ongoing stress and challenges surrounding spouses ongoing pressing medical needs, providing support and validation of patient's expressed frustrations surrounding barriers to securing adequate care imposed by systems.  Utilized open-ended questions to further elicit patient's thoughts, feelings, and perspectives surrounding presenting  challenges and ways in which patient has proven to navigate stressors over recent weeks.  Actively engage patient in reassessing presenting depressive and anxious symptoms experienced over the past 2 weeks via PHQ-9 and GAD-7, further engaging patient in processing of reduction of experienced and/or observed symptoms over recent weeks, and processing patient's individual efforts at managing symptoms successfully.  Clinician reassessed severity of presenting sxs, and presence of any safety concerns. Therapist provided support and empathy to patient during session.  Plan: Return again in 2 weeks.  Diagnosis:  Encounter Diagnoses  Name Primary?   GAD (generalized anxiety disorder) Yes   Major depressive disorder, recurrent episode, mild (HCC)      Collaboration of Care: Other none necessary at this time.  Patient/Guardian was advised Release of Information must be obtained prior to any record release in order to collaborate their care with an outside provider. Patient/Guardian was advised if they have not already done so to contact the registration department to sign all necessary forms in order for us  to release information regarding their care.   Consent: Patient/Guardian gives verbal consent for treatment and assignment of benefits for services provided during this visit. Patient/Guardian expressed understanding and agreed to proceed.   Patsi Boots, MSW, LCSW 12/13/2023,  5:03 PM

## 2023-12-26 ENCOUNTER — Ambulatory Visit (INDEPENDENT_AMBULATORY_CARE_PROVIDER_SITE_OTHER): Admitting: Licensed Clinical Social Worker

## 2023-12-26 DIAGNOSIS — F411 Generalized anxiety disorder: Secondary | ICD-10-CM | POA: Diagnosis not present

## 2023-12-26 DIAGNOSIS — F330 Major depressive disorder, recurrent, mild: Secondary | ICD-10-CM | POA: Diagnosis not present

## 2023-12-26 NOTE — Progress Notes (Unsigned)
 THERAPIST PROGRESS NOTE   Session Date: 12/26/2023  Session Time: 1406 - 1506 Virtual Visit via Video Note  I connected with Matthew Hutchinson on 12/26/23 at  2:06 PM EST by a video enabled telemedicine application and verified that I am speaking with the correct person using two identifiers.  Location: Patient: Home Provider: Home Office   I discussed the limitations of evaluation and management by telemedicine and the availability of in person appointments. The patient expressed understanding and agreed to proceed.   The patient was advised to call back or seek an in-person evaluation if the symptoms worsen or if the condition fails to improve as anticipated.  I provided 60 minutes of non-face-to-face time during this encounter.  Participation Level: Active  Behavioral Response: CasualAlertEuthymic  Type of Therapy: Individual Therapy  Treatment Goals addressed:  - LTG: Reduce frequency, intensity, and duration of depression symptoms so that daily functioning is improved (OP Depression) - LTG: Increase coping skills to manage depression and improve ability to perform daily activities (OP Depression) - STG: Reduce overall depression score by a minimum of 25% on the Patient Health Questionnaire (PHQ-9) (OP Depression) - STG: Lindsey will identify cognitive patterns and beliefs that support depression (OP Depression) - STG: Lyonel will reduce frequency of avoidant behaviors by 50% as evidenced by self-report in therapy sessions (Anxiety) - LTG: Continue to keep depression manageable when faced with the inevitable horribleness (OP Depression) - LTG: Maintain abilities at managing anxiety and have minimal panic attacks (Anxiety)  ProgressTowards Goals: Progressing  Interventions: CBT, Motivational Interviewing, and Supportive  Summary: Matthew Hutchinson is a 63 y.o. male with past psych history of MDD and GAD, presenting for follow-up therapy session in efforts to improve management  of depressive and anxious symptoms.   Patient actively engaged in session, presenting in overall pleasant moods, and congruent affect throughout session. Pt openly engaged in introductory check-in, sharing of Well, I'm here, providing recounts of recent events with husbands continued presenting medical needs, having been rushed to ED from rehab due to low hemoglobin, being read again at ED, proving to re-run labs, determining stable limits, resulting in being discharged from rehab, however not needing INPT admission, noting of spouse not warranting continued HOSP stay. Actively processed pt's ongoing efforts at navigating stressors, keeping busy with work/business responsibilities. Continued to detail ongoing frustrations with medical care spouse is receiving, proving to be the primary stressor at this time, reflecting on prior week's GAD-7 and PHQ-9 screenings, noting of not too far from where I was last time, just stressing today about whether [place] with approve his rehab stay, sharing of not experiencing significantly greater distress or increased sxs since prior visit.  Patient responded well to interventions. Patient continues to meet criteria for MDD and GAD. Patient will continue to benefit from engagement in outpatient therapy due to being the least restrictive service to meet presenting needs.      12/13/2023    5:03 PM 11/30/2023    3:13 PM 11/15/2023    3:55 PM 09/12/2023    3:54 PM 08/15/2023    3:15 PM  Depression screen PHQ 2/9  Decreased Interest 1 1 0 0 0  Down, Depressed, Hopeless 1 3 3 3 3   PHQ - 2 Score 2 4 3 3 3   Altered sleeping 0 1 3 3  0  Tired, decreased energy 0 2 3 1 3   Change in appetite 0  3 1 0  Feeling bad or failure about yourself  1 3 3 1  3  Trouble concentrating 1 3 3 3 3   Moving slowly or fidgety/restless 0 2 3 3 2   Suicidal thoughts 0 0 3 0 0  PHQ-9 Score 4 15 24 15 14   Difficult doing work/chores Somewhat difficult Very difficult Extremely dIfficult Very  difficult Extremely dIfficult      12/13/2023    5:02 PM 11/30/2023    3:14 PM 11/15/2023    3:54 PM 09/12/2023    3:51 PM  GAD 7 : Generalized Anxiety Score  Nervous, Anxious, on Edge 2 3 3 3   Control/stop worrying 2 3 3 3   Worry too much - different things 1 3 3 1   Trouble relaxing 1 3 3 3   Restless 1 3 3 1   Easily annoyed or irritable 2 3 3 3   Afraid - awful might happen 1 3 3 3   Total GAD 7 Score 10 21 21 17   Anxiety Difficulty Very difficult Very difficult Extremely difficult Very difficult   Suicidal/Homicidal: Nowithout intent/plan.   Therapist Response: Clinician utilized CBT, MI, and supportive reflection interventions to address presenting sxs.  Clinician actively greeted patient upon joining today's virtual visit, engaging in introductory check-in, assessing presenting moods and affect, prompting patient's reflection of recent events of the past two weeks, and factors contributing to presenting moods. Actively listened to pt's recounts of events of the past week, utilizing open ended questions to support pt in navigating thoughts and feelings surrounding stress and/or challenges related to spouses chronic physical health conditions, and pt's feelings of hopelessness in relation to medical care received by spouse and health coverage. Utilized socratic questioning to support pt in greater critical processing of thoughts and feelings, as well as how such thoughts and feelings prove to impact perspective and moods.   Clinician reassessed severity of presenting sxs, and presence of any safety concerns. Therapist provided support and empathy to patient during session.  Plan: Return again in 2 weeks.  Diagnosis:  Encounter Diagnoses  Name Primary?   GAD (generalized anxiety disorder) Yes   Major depressive disorder, recurrent episode, mild (HCC)     Collaboration of Care: Other none necessary at this time.  Patient/Guardian was advised Release of Information must be obtained prior  to any record release in order to collaborate their care with an outside provider. Patient/Guardian was advised if they have not already done so to contact the registration department to sign all necessary forms in order for us  to release information regarding their care.   Consent: Patient/Guardian gives verbal consent for treatment and assignment of benefits for services provided during this visit. Patient/Guardian expressed understanding and agreed to proceed.   Patsi Boots, MSW, LCSW 12/26/2023,  2:07 PM

## 2023-12-27 ENCOUNTER — Ambulatory Visit (HOSPITAL_COMMUNITY): Admitting: Licensed Clinical Social Worker

## 2023-12-30 DIAGNOSIS — G4733 Obstructive sleep apnea (adult) (pediatric): Secondary | ICD-10-CM | POA: Diagnosis not present

## 2024-01-04 DIAGNOSIS — H35033 Hypertensive retinopathy, bilateral: Secondary | ICD-10-CM | POA: Diagnosis not present

## 2024-01-04 DIAGNOSIS — E119 Type 2 diabetes mellitus without complications: Secondary | ICD-10-CM | POA: Diagnosis not present

## 2024-01-04 DIAGNOSIS — H26493 Other secondary cataract, bilateral: Secondary | ICD-10-CM | POA: Diagnosis not present

## 2024-01-04 DIAGNOSIS — H43812 Vitreous degeneration, left eye: Secondary | ICD-10-CM | POA: Diagnosis not present

## 2024-01-10 ENCOUNTER — Ambulatory Visit (INDEPENDENT_AMBULATORY_CARE_PROVIDER_SITE_OTHER): Admitting: Licensed Clinical Social Worker

## 2024-01-10 DIAGNOSIS — F411 Generalized anxiety disorder: Secondary | ICD-10-CM

## 2024-01-10 DIAGNOSIS — F330 Major depressive disorder, recurrent, mild: Secondary | ICD-10-CM | POA: Diagnosis not present

## 2024-01-10 NOTE — Progress Notes (Unsigned)
 THERAPIST PROGRESS NOTE   Session Date: 01/10/2024  Session Time: 1406 - 1729 Virtual Visit via Video Note  I connected with Matthew Hutchinson on 01/10/24 at  4:00 PM EDT by a video enabled telemedicine application and verified that I am speaking with the correct person using two identifiers.  Location: Patient: Home Provider: Home Office   I discussed the limitations of evaluation and management by telemedicine and the availability of in person appointments. The patient expressed understanding and agreed to proceed.   The patient was advised to call back or seek an in-person evaluation if the symptoms worsen or if the condition fails to improve as anticipated.  I provided 83 minutes of non-face-to-face time during this encounter.  Participation Level: Active  Behavioral Response: CasualAlertEuthymic  Type of Therapy: Individual Therapy  Treatment Goals addressed:  - LTG: Reduce frequency, intensity, and duration of depression symptoms so that daily functioning is improved (OP Depression) - LTG: Increase coping skills to manage depression and improve ability to perform daily activities (OP Depression) - STG: Reduce overall depression score by a minimum of 25% on the Patient Health Questionnaire (PHQ-9) (OP Depression) - STG: Matthew Hutchinson will identify cognitive patterns and beliefs that support depression (OP Depression) - STG: Matthew Hutchinson will reduce frequency of avoidant behaviors by 50% as evidenced by self-report in therapy sessions (Anxiety) - LTG: Continue to keep depression manageable when faced with the inevitable horribleness (OP Depression) - LTG: Maintain abilities at managing anxiety and have minimal panic attacks (Anxiety)  ProgressTowards Goals: Progressing  Interventions: CBT, Motivational Interviewing, and Supportive  Summary: Matthew Hutchinson is a 63 y.o. male with past psych history of MDD and GAD, presenting for follow-up therapy session in efforts to improve management of  depressive and anxious symptoms.   Patient actively engaged in session, presenting in overall pleasant moods, and congruent affect throughout session. Pt openly engaged in introductory check-in, sharing of The afternoon after we last spoke, everything went to hell in a hand basket, further sharing of spouse having been denied rehab by medicare again, resulting in taking steps to connect with state representative regarding challenges, as well as following up with other medical professionals that have provided continued care for pt's spouse and seeking support from system leadership, leading to reassignment of providers and care team, proving to aid in petitioning medicare denial. Shared of having found self the lowest I've ever felt, further detailing of feeling ultimately defeated due to inability to navigate challenges individually. Pt shared of maintained stress surrounding financial factors and abilities at managing such, and further detailed increased thoughts surrounding spouses and individual future and plans surrounding potential move to an elderly community to better meet spouses needs with continued declining health in future years.  Patient responded well to interventions. Patient continues to meet criteria for MDD and GAD. Patient will continue to benefit from engagement in outpatient therapy due to being the least restrictive service to meet presenting needs.      12/13/2023    5:03 PM 11/30/2023    3:13 PM 11/15/2023    3:55 PM 09/12/2023    3:54 PM 08/15/2023    3:15 PM  Depression screen PHQ 2/9  Decreased Interest 1 1 0 0 0  Down, Depressed, Hopeless 1 3 3 3 3   PHQ - 2 Score 2 4 3 3 3   Altered sleeping 0 1 3 3  0  Tired, decreased energy 0 2 3 1 3   Change in appetite 0  3 1 0  Feeling bad or  failure about yourself  1 3 3 1 3   Trouble concentrating 1 3 3 3 3   Moving slowly or fidgety/restless 0 2 3 3 2   Suicidal thoughts 0 0 3 0 0  PHQ-9 Score 4 15 24 15 14   Difficult doing  work/chores Somewhat difficult Very difficult Extremely dIfficult Very difficult Extremely dIfficult      12/13/2023    5:02 PM 11/30/2023    3:14 PM 11/15/2023    3:54 PM 09/12/2023    3:51 PM  GAD 7 : Generalized Anxiety Score  Nervous, Anxious, on Edge 2 3 3 3   Control/stop worrying 2 3 3 3   Worry too much - different things 1 3 3 1   Trouble relaxing 1 3 3 3   Restless 1 3 3 1   Easily annoyed or irritable 2 3 3 3   Afraid - awful might happen 1 3 3 3   Total GAD 7 Score 10 21 21 17   Anxiety Difficulty Very difficult Very difficult Extremely difficult Very difficult   Suicidal/Homicidal: Nowithout intent/plan.   Therapist Response: Clinician utilized CBT, MI, and supportive reflection interventions to address presenting sxs.  Clinician actively greeted pt upon joining today's visit, engaging in introductory check-in, assessing presenting moods and affect, and further prompting pt's recounts of recent events and factors contributing to presenting moods. Actively listened to pt's extensive reflections of continued increase in stress and challenges surrounding spouses medical needs, difficulties navigating medical coverage denials for necessary services, and pt's individual attempts at navigating barriers and utilizing supports within medical profession. Utilized open ended questions to further evoke pt's identified thoughts, feelings, and perspectives in relation to presenting stressors, and individual means of managing such stress. Utilized socratic questions to encourage greater critical processing of thoughts and perspectives related to recent stress and challenges, and exploring rational vs. Irrational thoughts and distorted perspectives based on hx of challenging interactions.  Clinician reassessed severity of presenting sxs, and presence of any safety concerns. Therapist provided support and empathy to patient during session.  Plan: Return again in 2 weeks.  Diagnosis:  Encounter Diagnoses   Name Primary?   GAD (generalized anxiety disorder) Yes   Major depressive disorder, recurrent episode, mild (HCC)      Collaboration of Care: Other none necessary at this time.  Patient/Guardian was advised Release of Information must be obtained prior to any record release in order to collaborate their care with an outside provider. Patient/Guardian was advised if they have not already done so to contact the registration department to sign all necessary forms in order for us  to release information regarding their care.   Consent: Patient/Guardian gives verbal consent for treatment and assignment of benefits for services provided during this visit. Patient/Guardian expressed understanding and agreed to proceed.   Lynwood JONETTA Maris, MSW, LCSW 01/10/2024,  4:06 PM

## 2024-01-22 ENCOUNTER — Other Ambulatory Visit: Payer: Self-pay | Admitting: Family Medicine

## 2024-01-24 ENCOUNTER — Ambulatory Visit (INDEPENDENT_AMBULATORY_CARE_PROVIDER_SITE_OTHER): Admitting: Licensed Clinical Social Worker

## 2024-01-24 DIAGNOSIS — F411 Generalized anxiety disorder: Secondary | ICD-10-CM

## 2024-01-24 DIAGNOSIS — F330 Major depressive disorder, recurrent, mild: Secondary | ICD-10-CM

## 2024-01-24 NOTE — Progress Notes (Unsigned)
 THERAPIST PROGRESS NOTE   Session Date: 01/24/2024  Session Time: 1306 - 1432  Participation Level: Active  MSE/Presentation: Behavior: Appropriate and Sharing Speech: Normal Thought Process: Coherent and Relevant Cognition: Alert and Appropriate Mood: Anxious and Euthymic Affect: Congruent Insight: Good Appearance: Casual  Type of Therapy: Individual Therapy  Treatment Goals addressed:    Progress Towards Goals: Progressing  Interventions: CBT, Motivational Interviewing, Solution Focused, and Supportive  Summary: Matthew Hutchinson is a 63 y.o. male with psych history of MDD and GAD, presenting for follow-up therapy session in efforts to improve management of depressive and anxious symptoms.   Patient actively engaged in session, presenting in pleasant moods, and congruent affect throughout session. Pt openly engaged in introductory check-in, sharing of It's going, further providing details of daily events, upcoming plans for spouses 70th birthday today, spouses disappointment in inabilities to celebrate birthday as previously desired, alternate plans for birthday celbration while in SNF, and other recent events. Pt further shared continued factors surrounding spouses continued medical needs, appeals for continued stay at SNF. ***      The afternoon after we last spoke, everything went to hell in a hand basket, further sharing of spouse having been denied rehab by medicare again, resulting in taking steps to connect with state representative regarding challenges, as well as following up with other medical professionals that have provided continued care for pt's spouse and seeking support from system leadership, leading to reassignment of providers and care team, proving to aid in petitioning medicare denial. Shared of having found self the lowest I've ever felt, further detailing of feeling ultimately defeated due to inability to navigate challenges individually. Pt  shared of maintained stress surrounding financial factors and abilities at managing such, and further detailed increased thoughts surrounding spouses and individual future and plans surrounding potential move to an elderly community to better meet spouses needs with continued declining health in future years.  Patient responded well to interventions. Patient continues to meet criteria for MDD and GAD. Patient will continue to benefit from engagement in outpatient therapy due to being the least restrictive service to meet presenting needs.      12/13/2023    5:02 PM 11/30/2023    3:14 PM 11/15/2023    3:54 PM 09/12/2023    3:51 PM  GAD 7 : Generalized Anxiety Score  Nervous, Anxious, on Edge 2 3 3 3   Control/stop worrying 2 3 3 3   Worry too much - different things 1 3 3 1   Trouble relaxing 1 3 3 3   Restless 1 3 3 1   Easily annoyed or irritable 2 3 3 3   Afraid - awful might happen 1 3 3 3   Total GAD 7 Score 10 21 21 17   Anxiety Difficulty Very difficult Very difficult Extremely difficult Very difficult      12/13/2023    5:03 PM 11/30/2023    3:13 PM 11/15/2023    3:55 PM 09/12/2023    3:54 PM 08/15/2023    3:15 PM  Depression screen PHQ 2/9  Decreased Interest 1 1 0 0 0  Down, Depressed, Hopeless 1 3 3 3 3   PHQ - 2 Score 2 4 3 3 3   Altered sleeping 0 1 3 3  0  Tired, decreased energy 0 2 3 1 3   Change in appetite 0  3 1 0  Feeling bad or failure about yourself  1 3 3 1 3   Trouble concentrating 1 3 3 3 3   Moving slowly or fidgety/restless 0 2  3 3 2   Suicidal thoughts 0 0 3 0 0  PHQ-9 Score 4 15 24 15 14   Difficult doing work/chores Somewhat difficult Very difficult Extremely dIfficult Very difficult Extremely dIfficult   Flowsheet Row Counselor from 11/15/2023 in [facility] from 11/24/2022 in [facility] from 10/05/2022 in [facility] at [facility] RISK  CATEGORY Moderate Risk Low Risk Low Risk    Suicidal/Homicidal: None, No plan to harm self or others  Therapist Response: Clinician utilized CBT, MI, Psychoeducational, strengths based and supportive reflection interventions to support patient in efforts to address presenting sxs and challenges surrounding presenting stressors.  Clinician ***     actively greeted pt upon joining today's visit, engaging in introductory check-in, assessing presenting moods and affect, and further prompting pt's recounts of recent events and factors contributing to presenting moods. Actively listened to pt's extensive reflections of continued increase in stress and challenges surrounding spouses medical needs, difficulties navigating medical coverage denials for necessary services, and pt's individual attempts at navigating barriers and utilizing supports within medical profession. Utilized open ended questions to further evoke pt's identified thoughts, feelings, and perspectives in relation to presenting stressors, and individual means of managing such stress. Utilized socratic questions to encourage greater critical processing of thoughts and perspectives related to recent stress and challenges, and exploring rational vs. Irrational thoughts and distorted perspectives based on hx of challenging interactions.  Clinician reassessed severity of presenting sxs, and presence of any safety concerns. Therapist provided support and empathy to patient during session.  Homework: None.  Plan: Return again in 2 weeks.  Diagnosis:  Encounter Diagnoses  Name Primary?   GAD (generalized anxiety disorder) Yes   Major depressive disorder, recurrent episode, mild (HCC)     Collaboration of Care: Psychiatrist AEB provider documentation available in EHR.  Patient/Guardian was advised Release of Information must be obtained prior to any record release in order to collaborate their care with an outside provider.  Patient/Guardian was advised if they have not already done so to contact the registration department to sign all necessary forms in order for us  to release information regarding their care.   Consent: Patient/Guardian gives verbal consent for treatment and assignment of benefits for services provided during this visit. Patient/Guardian expressed understanding and agreed to proceed.   Virtual Visit via Video Note  I connected with Matthew Hutchinson on 01/24/24 at  1:00 PM EDT by a video enabled telemedicine application and verified that I am speaking with the correct person using two identifiers.  Location: Patient: Home Provider: Home Office   I discussed the limitations of evaluation and management by telemedicine and the availability of in person appointments. The patient expressed understanding and agreed to proceed.   The patient was advised to call back or seek an in-person evaluation if the symptoms worsen or if the condition fails to improve as anticipated.  I provided 85 minutes of non-face-to-face time during this encounter.  Lynwood JONETTA Maris, MSW, LCSW 01/24/2024,  1:13 PM

## 2024-02-01 ENCOUNTER — Telehealth (HOSPITAL_COMMUNITY): Admitting: Psychiatry

## 2024-02-01 ENCOUNTER — Other Ambulatory Visit (HOSPITAL_COMMUNITY): Payer: Self-pay | Admitting: Psychiatry

## 2024-02-01 ENCOUNTER — Encounter (HOSPITAL_COMMUNITY): Payer: Self-pay | Admitting: Psychiatry

## 2024-02-01 DIAGNOSIS — F330 Major depressive disorder, recurrent, mild: Secondary | ICD-10-CM | POA: Diagnosis not present

## 2024-02-01 DIAGNOSIS — F411 Generalized anxiety disorder: Secondary | ICD-10-CM

## 2024-02-01 MED ORDER — HYDROXYZINE PAMOATE 50 MG PO CAPS: ORAL_CAPSULE | ORAL | 0 refills | Status: DC

## 2024-02-01 MED ORDER — TRAZODONE HCL 100 MG PO TABS
100.0000 mg | ORAL_TABLET | Freq: Every evening | ORAL | 0 refills | Status: AC | PRN
Start: 2024-02-01 — End: ?

## 2024-02-01 MED ORDER — LAMOTRIGINE 200 MG PO TABS: 200.0000 mg | ORAL_TABLET | Freq: Every day | ORAL | 0 refills | Status: DC

## 2024-02-01 MED ORDER — SERTRALINE HCL 100 MG PO TABS: ORAL_TABLET | ORAL | 0 refills | Status: DC

## 2024-02-01 NOTE — Progress Notes (Signed)
 [facility] MD Virtual Progress Note   Patient Location: Home Provider Location: Home Office  I connect with patient by telephone and verified that I am speaking with correct person by using two identifiers. I discussed the limitations of evaluation and management by telemedicine and the availability of in person appointments. I also discussed with the patient that there may be a patient responsible charge related to this service. The patient expressed understanding and agreed to proceed.  Matthew Hutchinson [id number] 63 y.o.  02/01/2024 11:24 AM  History of Present Illness:  Patient is evaluated by phone session.  He could not do video visit today.  He reported a lot of stress related to his work and taking care of Arley.  He admitted having some mistakes which he usually not done.  He is upset and that has triggered the anxiety.  He has mood up-and-down but overall trying to remain calm.  Patient told recently stress coming from [facility] health as he was doing better but now he may have more pressure sores.  He is taking his medication as prescribed.  He recently has blood work and his hemoglobin A1c improved.  He also lost weight.  He reports energy level is good and he is taking all his medication as prescribed.  He is on Mounjaro .  Denies any mania, psychosis, hallucination or any severe mood swings.  So far tolerating medication reported no tremor or shakes.  He has not taken trazodone  and does not need any more refills because if needed he has plenty of pills remaining.  Patient denies any severe panic attack.  He has no rash or any itching.  Past Psychiatric History: H/O inpatient at [facility] for overdose on Ambien .  H/O overdose on Ambien  2 other times but did not require inpatient treatment.  H/O of mood swing, impulsive behavior, speeding ticket, anger, road rage.  No history of psychosis or hallucination.     Past Medical History:  Diagnosis Date   Allergy    Anxiety     Arthritis    COPD (chronic obstructive pulmonary disease) (HCC)    Depression    Emphysema of lung (HCC)    Hyperlipidemia    Morbid obesity (HCC)    OSA (obstructive sleep apnea)    uses CPAP nightly   PONV (postoperative nausea and vomiting)    Unspecified essential hypertension     Outpatient Encounter Medications as of 02/01/2024  Medication Sig   amLODipine  (NORVASC ) 5 MG tablet TAKE 1 TABLET (5 MG TOTAL) BY MOUTH DAILY.   ARNUITY ELLIPTA  100 MCG/ACT AEPB INHALE 1 PUFF INTO THE LUNGS DAILY   aspirin  81 MG tablet Take 1 tablet (81 mg total) by mouth daily.   b complex vitamins tablet Take 1 tablet by mouth daily.   calcium  gluconate 500 MG tablet Take 500 mg by mouth daily.   Carboxymethylcellul-Glycerin (LUBRICATING EYE DROPS OP) Place 1 drop into both eyes daily as needed (dry eyes).   Cholecalciferol  (VITAMIN D ) 2000 UNITS tablet Take 1 tablet (2,000 Units total) by mouth daily.   Continuous Glucose Sensor (FREESTYLE LIBRE 3 SENSOR) MISC Apply as instructed, change every 10 days.   Continuous Glucose Sensor (FREESTYLE LIBRE 3 SENSOR) MISC APPLY AS INSTRUCTED, CHANGE EVERY 10 DAYS.   Continuous Glucose Sensor (FREESTYLE LIBRE 3 SENSOR) MISC APPLY AS INSTRUCTED, CHANGE EVERY 14 DAYS   diclofenac (VOLTAREN) 75 MG EC tablet Take 1 tablet by mouth 2 (two) times daily with a meal.   gabapentin  (NEURONTIN ) 300  MG capsule TAKE 2 CAPSULES BY MOUTH 2 TIMES DAILY.   GARLIC PO Take 100 mg by mouth daily.   Glucagon  (GVOKE HYPOPEN  1-PACK) 1 MG/0.2ML SOAJ Inject 1 mg into the skin once as needed for up to 1 dose.   hydrOXYzine  (VISTARIL ) 50 MG capsule TAKE 2 CAPSULES BY MOUTH AT BEDTIME   lamoTRIgine  (LAMICTAL ) 200 MG tablet Take 1 tablet (200 mg total) by mouth daily.   losartan -hydrochlorothiazide  (HYZAAR) 100-25 MG tablet Take 1 tablet by mouth daily.   Lutein  40 MG CAPS Take 40 mg by mouth at bedtime.   magnesium  gluconate (MAGONATE) 500 MG tablet Take 500 mg by mouth daily.   metFORMIN   (GLUCOPHAGE ) 500 MG tablet TAKE 1 TABLET BY MOUTH 2 TIMES DAILY WITH A MEAL.   Multiple Vitamin (MULTIVITAMIN) capsule Take 1 capsule by mouth daily.   nitroGLYCERIN  (NITROSTAT ) 0.4 MG SL tablet Place 1 tablet (0.4 mg total) under the tongue every 5 (five) minutes as needed for chest pain. If chest pain not resolved, after 3 doses 5 minutes apart--call 911 (Patient taking differently: Place 0.4 mg under the tongue See admin instructions. Take 0.4 mg as needed for hiccups)   Potassium 99 MG TABS Take 99 mg by mouth at bedtime.   sertraline  (ZOLOFT ) 100 MG tablet TAKE 1 TABLET BY MOUTH DAILY   Shark Cartilage 740 MG CAPS Take 1 capsule by mouth 3 (three) times daily.   simvastatin  (ZOCOR ) 20 MG tablet TAKE 1 TABLET BY MOUTH EVERYDAY AT BEDTIME   TECHLITE PLUS PEN NEEDLES 32G X 4 MM MISC USE ONCE DAILY WITH LANTUS . DX E11.9, Z79.4   tirzepatide  (MOUNJARO ) 7.5 MG/0.5ML Pen INJECT 7.5 MG SUBCUTANEOUSLY WEEKLY   traMADol  (ULTRAM ) 50 MG tablet Take 50 mg by mouth daily as needed for severe pain.   traZODone  (DESYREL ) 100 MG tablet Take 1 tablet (100 mg total) by mouth at bedtime as needed for sleep.   No facility-administered encounter medications on file as of 02/01/2024.    Recent Results (from the past 2160 hours)  HM DIABETES EYE EXAM     Status: None   Collection Time: 12/01/23  9:58 AM  Result Value Ref Range   HM Diabetic Eye Exam No Retinopathy No Retinopathy    Comment: Abstracted by HIM  Comprehensive metabolic panel with GFR     Status: None   Collection Time: 12/01/23  1:57 PM  Result Value Ref Range   Sodium 136 135 - 145 mEq/L   Potassium 3.8 3.5 - 5.1 mEq/L   Chloride 101 96 - 112 mEq/L   CO2 30 19 - 32 mEq/L   Glucose, Bld 92 70 - 99 mg/dL   BUN 11 6 - 23 mg/dL   Creatinine, Ser 9.09 0.40 - 1.50 mg/dL   Total Bilirubin 0.9 0.2 - 1.2 mg/dL   Alkaline Phosphatase 72 39 - 117 U/L   AST 18 0 - 37 U/L   ALT 18 0 - 53 U/L   Total Protein 7.4 6.0 - 8.3 g/dL   Albumin 4.6 3.5 -  5.2 g/dL   GFR 08.64 >39.99 mL/min    Comment: Calculated using the CKD-EPI Creatinine Equation (2021)   Calcium  9.4 8.4 - 10.5 mg/dL  Hemoglobin J8r     Status: None   Collection Time: 12/01/23  1:57 PM  Result Value Ref Range   Hgb A1c MFr Bld 5.8 4.6 - 6.5 %    Comment: Glycemic Control Guidelines for People with Diabetes:Non Diabetic:  <6%Goal of Therapy: <7%Additional  Action Suggested:  >8%   Microalbumin / creatinine urine ratio     Status: None   Collection Time: 12/01/23  2:45 PM  Result Value Ref Range   Microalb, Ur 1.8 0.0 - 1.9 mg/dL   Creatinine,U 846.8 mg/dL   Microalb Creat Ratio 11.8 0.0 - 30.0 mg/g     Psychiatric Specialty Exam: Physical Exam  Review of Systems  Weight 291 lb (132 kg).There is no height or weight on file to calculate BMI.  General Appearance: NA  Eye Contact:  NA  Speech:  Slow  Volume:  Normal  Mood:  Anxious  Affect:  Congruent  Thought Process:  Goal Directed  Orientation:  Full (Time, Place, and Person)  Thought Content:  Rumination  Suicidal Thoughts:  No  Homicidal Thoughts:  No  Memory:  Immediate;   Good Recent;   Good Remote;   Good  Judgement:  Intact  Insight:  Present  Psychomotor Activity:  NA  Concentration:  Concentration: Good and Attention Span: Good  Recall:  Good  Fund of Knowledge:  Good  Language:  Good  Akathisia:  No  Handed:  Right  AIMS (if indicated):     Assets:  Communication Skills Desire for Improvement Housing Resilience Transportation  ADL's:  Intact  Cognition:  WNL  Sleep:  fair to ok uses CPAP       12/13/2023    5:03 PM 11/30/2023    3:13 PM 11/15/2023    3:55 PM 09/12/2023    3:54 PM 08/15/2023    3:15 PM  Depression screen PHQ 2/9  Decreased Interest 1 1 0 0 0  Down, Depressed, Hopeless 1 3 3 3 3   PHQ - 2 Score 2 4 3 3 3   Altered sleeping 0 1 3 3  0  Tired, decreased energy 0 2 3 1 3   Change in appetite 0  3 1 0  Feeling bad or failure about yourself  1 3 3 1 3   Trouble concentrating 1  3 3 3 3   Moving slowly or fidgety/restless 0 2 3 3 2   Suicidal thoughts 0 0 3 0 0  PHQ-9 Score 4 15 24 15 14   Difficult doing work/chores Somewhat difficult Very difficult Extremely dIfficult Very difficult Extremely dIfficult    Assessment/Plan: Major depressive disorder, recurrent episode, mild (HCC) - Plan: lamoTRIgine  (LAMICTAL ) 200 MG tablet, sertraline  (ZOLOFT ) 100 MG tablet, traZODone  (DESYREL ) 100 MG tablet, hydrOXYzine  (VISTARIL ) 50 MG capsule  GAD (generalized anxiety disorder) - Plan: sertraline  (ZOLOFT ) 100 MG tablet  Reviewed blood work results.  Labs are improved from the past.  He had lost weight since Mounjaro .  Discussed chronic stressors.  Patient is in therapy with Moses James and that has been helpful.  He does not want to change the medication at this time.  Continue Zoloft  100 mg daily, Lamictal  200 mg daily and hydroxyzine  50 mg 2 times a day.  He will not need a new prescription of trazodone .  Recommended to call us  back with any question or any concern.  Follow-up in 3 months   Follow Up Instructions:     I discussed the assessment and treatment plan with the patient. The patient was provided an opportunity to ask questions and all were answered. The patient agreed with the plan and demonstrated an understanding of the instructions.   The patient was advised to call back or seek an in-person evaluation if the symptoms worsen or if the condition fails to improve as anticipated.    Collaboration  of Care: Other provider involved in patient's care AEB notes are available in epic to review  Patient/Guardian was advised Release of Information must be obtained prior to any record release in order to collaborate their care with an outside provider. Patient/Guardian was advised if they have not already done so to contact the registration department to sign all necessary forms in order for us  to release information regarding their care.   Consent: Patient/Guardian gives  verbal consent for treatment and assignment of benefits for services provided during this visit. Patient/Guardian expressed understanding and agreed to proceed.     Total encounter time 18 minutes which includes face-to-face time, chart reviewed, care coordination, order entry and documentation during this encounter.   Note: This document was prepared by [employer] voice dictation technology and any errors that results from this process are unintentional.    Leni ONEIDA Client, MD 02/01/2024

## 2024-02-07 ENCOUNTER — Ambulatory Visit (INDEPENDENT_AMBULATORY_CARE_PROVIDER_SITE_OTHER): Admitting: Licensed Clinical Social Worker

## 2024-02-07 NOTE — Progress Notes (Signed)
 THERAPIST PROGRESS NOTE   Session Date: 02/07/2024  Session Time: 1604 - 1610  LCSW connected with pt via [facility] video visit platform, observing pt to be seated in ED pt room, accompanying spouse. Pt shared openness to proceed with visit due to being comfortable with spouse being present, however expressed understanding of need to cancel visit due to environment not proving conducive with pt's own tx. Pt briefly shared current stress surrounding ongoing concerns related to spouses persistent medical conditions. Pt proved receptive to canceling today's visit due to medical emergency of spouse to ensure capabilities of support spouses immediate needs.  Pt endorsed safety, denying risk of harm to self or others, expressing awareness of abilities to contact office if needing support. Pt proved agreeable to rescheduling of appt for next scheduled visit on 08/19.   Lynwood JONETTA Maris, MSW, LCSW 02/07/2024,  4:34 PM

## 2024-02-23 ENCOUNTER — Encounter (HOSPITAL_COMMUNITY): Payer: Self-pay

## 2024-02-24 ENCOUNTER — Other Ambulatory Visit: Payer: Self-pay | Admitting: Medical Genetics

## 2024-02-28 ENCOUNTER — Ambulatory Visit (HOSPITAL_COMMUNITY): Admitting: Licensed Clinical Social Worker

## 2024-03-01 ENCOUNTER — Ambulatory Visit: Admitting: Family Medicine

## 2024-03-01 ENCOUNTER — Encounter: Payer: Self-pay | Admitting: Family Medicine

## 2024-03-01 VITALS — BP 112/56 | HR 74 | Temp 98.6°F | Resp 17 | Ht 74.0 in | Wt 305.8 lb

## 2024-03-01 DIAGNOSIS — Z794 Long term (current) use of insulin: Secondary | ICD-10-CM

## 2024-03-01 DIAGNOSIS — E785 Hyperlipidemia, unspecified: Secondary | ICD-10-CM

## 2024-03-01 DIAGNOSIS — M79605 Pain in left leg: Secondary | ICD-10-CM | POA: Diagnosis not present

## 2024-03-01 DIAGNOSIS — E1165 Type 2 diabetes mellitus with hyperglycemia: Secondary | ICD-10-CM

## 2024-03-01 DIAGNOSIS — I10 Essential (primary) hypertension: Secondary | ICD-10-CM

## 2024-03-01 DIAGNOSIS — G8929 Other chronic pain: Secondary | ICD-10-CM

## 2024-03-01 MED ORDER — AMLODIPINE BESYLATE 5 MG PO TABS: 5.0000 mg | ORAL_TABLET | Freq: Every day | ORAL | 1 refills | Status: AC

## 2024-03-01 MED ORDER — GABAPENTIN 300 MG PO CAPS: ORAL_CAPSULE | ORAL | 1 refills | Status: AC

## 2024-03-01 MED ORDER — MOUNJARO 7.5 MG/0.5ML ~~LOC~~ SOAJ
7.5000 mg | SUBCUTANEOUS | 1 refills | Status: DC
Start: 2024-03-01 — End: 2024-05-30

## 2024-03-01 MED ORDER — METFORMIN HCL 500 MG PO TABS: 500.0000 mg | ORAL_TABLET | Freq: Two times a day (BID) | ORAL | 1 refills | Status: AC

## 2024-03-01 MED ORDER — LOSARTAN POTASSIUM-HCTZ 100-25 MG PO TABS: 1.0000 | ORAL_TABLET | Freq: Every day | ORAL | 1 refills | Status: AC

## 2024-03-01 MED ORDER — SIMVASTATIN 20 MG PO TABS: ORAL_TABLET | ORAL | 2 refills | Status: AC

## 2024-03-01 NOTE — Patient Instructions (Signed)
 Thanks for coming in today.  I am happy to hear about your blood sugar control, I think you are on the right regimen at this time.  No med changes.  If any concerns on labs I will let you know.  Hang in there and let me know if I can help.

## 2024-03-01 NOTE — Progress Notes (Signed)
 Subjective:  Patient ID: Matthew Hutchinson, male    DOB: 06-Jan-1961  Age: 63 y.o. MRN: [id number]  CC:  Chief Complaint  Patient presents with   Follow-up    3 month follow you. Medication and labs. No questions or concerns.     HPI Garmon Dehn Benton-Elliot presents for  Diabetes: Complicated by hyperglycemia, obesity, and prior hypoglycemia Mounjaro  5 mg weekly, metformin  500 mg twice daily.  Semglee  insulin  with adjustment of doses with prior hypoglycemia.  JUDITHANN Penta pen given if needed for severe symptomatic hypoglycemia.  As of his May visit he was down to 5 units of Semglee  per day.  Mounjaro  7.5 mg weekly since February.  Great control diabetes with A1c 5.8 in May.  Insulin  was discontinued as of his May visit with some intermittent lows prior. On gabapentin  for chronic neuropathic leg pain. CGM fasting range - 100-110 Postprandial - 120-130 No recent significant lows. 1 reading in 60-70 range past 90 days.  70-120 around 90% of the time.  Some stress eating.  No neck swelling, n/v abd pain or new side effects of meds.  Microalbumin: normal ratio 12/01/23 Optho, foot exam, pneumovax: 20 given in May. Urgent ophtho eval was ordered at his last visit, floaters with some discomfort. Wearing back brace. Spouse Arley back home after multiple operations and rehab.  Compression stockings at times for edema- improved after use recently - increased work to assist with spouse.   Diabetic Foot Exam - Simple   No data filed    Lab Results  Component Value Date   HGBA1C 5.8 12/01/2023   HGBA1C 6.1 07/21/2023   HGBA1C 6.6 (H) 04/20/2023   Lab Results  Component Value Date   MICROALBUR 1.8 12/01/2023   LDLCALC 58 07/21/2023   CREATININE 0.90 12/01/2023   Hypertension: losartan /HCTZ 100/25 mg daily.  Amlodipine  5 mg daily. No new side effects. Home readings:130-140/60-70 BP Readings from Last 3 Encounters:  03/01/24 (!) 112/56  11/30/23 122/66  08/18/23 126/74   Lab Results   Component Value Date   CREATININE 0.90 12/01/2023   Hyperlipidemia: Simvastatin  20 mg daily. No new side effects.  Lab Results  Component Value Date   CHOL 120 07/21/2023   HDL 44.00 07/21/2023   LDLCALC 58 07/21/2023   TRIG 88.0 07/21/2023   CHOLHDL 3 07/21/2023   Lab Results  Component Value Date   ALT 18 12/01/2023   AST 18 12/01/2023   ALKPHOS 72 12/01/2023   BILITOT 0.9 12/01/2023    History Patient Active Problem List   Diagnosis Date Noted   Dysphagia 11/29/2023   Encounter for screening for other metabolic disorders 11/29/2023   Screening for malignant neoplasm of colon 11/29/2023   Epigastric pain 11/29/2023   Major depressive disorder, recurrent episode, mild (HCC) 06/08/2023   GAD (generalized anxiety disorder) 06/08/2023   Pain due to onychomycosis of toenails of both feet 06/01/2023   Hammer toes of both feet 03/30/2023   Pain in joint of left shoulder 12/20/2022   Thoracic back pain 12/20/2022   Chronic pain of left lower extremity 09/30/2022   Type 2 diabetes mellitus with hyperglycemia, with long-term current use of insulin  (HCC) 09/30/2022   Hiccups 04/01/2022   Newly diagnosed diabetes (HCC) 07/27/2018   Morbid obesity (HCC) 02/02/2017   Chronic pain due to trauma 06/25/2013   Depression, recurrent (HCC) 11/09/2012   CAD (coronary artery disease) 02/21/2012   Lipid disorder 02/21/2012   Angina pectoris (HCC) 09/27/2011   Obstructive sleep apnea 05/20/2010  Essential hypertension 04/17/2010   COPD mixed type (HCC) 04/17/2010   WEIGHT GAIN, ABNORMAL 04/17/2010   Past Medical History:  Diagnosis Date   Allergy    Anxiety    Arthritis    COPD (chronic obstructive pulmonary disease) (HCC)    Depression    Emphysema of lung (HCC)    Hyperlipidemia    Morbid obesity (HCC)    OSA (obstructive sleep apnea)    uses CPAP nightly   PONV (postoperative nausea and vomiting)    Unspecified essential hypertension    Past Surgical History:   Procedure Laterality Date   ESOPHAGOGASTRODUODENOSCOPY (EGD) WITH PROPOFOL  N/A 10/16/2021   Procedure: ESOPHAGOGASTRODUODENOSCOPY (EGD) WITH PROPOFOL ;  Surgeon: Rollin Dover, MD;  Location: [facility];  Service: Endoscopy;  Laterality: N/A;   INSERTION OF MESH N/A 09/18/2015   Procedure: INSERTION OF MESH;  Surgeon: Vicenta Poli, MD;  Location: [facility];  Service: General;  Laterality: N/A;   LEG SURGERY Left    SAVORY DILATION N/A 10/16/2021   Procedure: SAVORY DILATION;  Surgeon: Rollin Dover, MD;  Location: [facility];  Service: Endoscopy;  Laterality: N/A;   SHOULDER ARTHROSCOPY Left    TONSILLECTOMY     UMBILICAL HERNIA REPAIR N/A 09/18/2015   Procedure: HERNIA REPAIR UMBILICAL ADULT WITH MESH ;  Surgeon: Vicenta Poli, MD;  Location: [facility];  Service: General;  Laterality: N/A;   Allergies  Allergen Reactions   Penicillins Anaphylaxis   Valsartan Itching and Swelling   Prior to Admission medications   Medication Sig Start Date End Date Taking? Authorizing Provider  amLODipine  (NORVASC ) 5 MG tablet TAKE 1 TABLET (5 MG TOTAL) BY MOUTH DAILY. 11/14/23   Levora Reyes SAUNDERS, MD  ARNUITY ELLIPTA  100 MCG/ACT AEPB INHALE 1 PUFF INTO THE LUNGS DAILY 09/12/23   Levora Reyes SAUNDERS, MD  aspirin  81 MG tablet Take 1 tablet (81 mg total) by mouth daily. 11/13/12   Jacquetta Sharlot GRADE, NP  b complex vitamins tablet Take 1 tablet by mouth daily.    [provider]  calcium  gluconate 500 MG tablet Take 500 mg by mouth daily.    [provider]  Carboxymethylcellul-Glycerin (LUBRICATING EYE DROPS OP) Place 1 drop into both eyes daily as needed (dry eyes).    [provider]  Cholecalciferol  (VITAMIN D ) 2000 UNITS tablet Take 1 tablet (2,000 Units total) by mouth daily. 11/13/12   Jacquetta Sharlot GRADE, NP  Continuous Glucose Sensor (FREESTYLE LIBRE 3 SENSOR) MISC Apply as instructed, change every 10 days. 08/30/23   Levora Reyes SAUNDERS, MD  Continuous  Glucose Sensor (FREESTYLE LIBRE 3 SENSOR) MISC APPLY AS INSTRUCTED, CHANGE EVERY 10 DAYS. 08/30/23   Levora Reyes SAUNDERS, MD  Continuous Glucose Sensor (FREESTYLE LIBRE 3 SENSOR) MISC APPLY AS INSTRUCTED, CHANGE EVERY 14 DAYS 08/30/23   Levora Reyes SAUNDERS, MD  diclofenac (VOLTAREN) 75 MG EC tablet Take 1 tablet by mouth 2 (two) times daily with a meal. 12/20/22   [provider]  gabapentin  (NEURONTIN ) 300 MG capsule TAKE 2 CAPSULES BY MOUTH 2 TIMES DAILY. 11/30/23   Levora Reyes SAUNDERS, MD  GARLIC PO Take 100 mg by mouth daily.    [provider]  Glucagon  (GVOKE HYPOPEN  1-PACK) 1 MG/0.2ML SOAJ Inject 1 mg into the skin once as needed for up to 1 dose. 07/21/23   Levora Reyes SAUNDERS, MD  hydrOXYzine  (VISTARIL ) 50 MG capsule TAKE 2 CAPSULES BY MOUTH AT BEDTIME 02/01/24   Arfeen, Leni DASEN, MD  lamoTRIgine  (LAMICTAL ) 200  MG tablet Take 1 tablet (200 mg total) by mouth daily. 02/01/24   Arfeen, Leni DASEN, MD  losartan -hydrochlorothiazide  (HYZAAR) 100-25 MG tablet Take 1 tablet by mouth daily. 11/30/23   Levora Reyes SAUNDERS, MD  Lutein  40 MG CAPS Take 40 mg by mouth at bedtime.    [provider]  magnesium  gluconate (MAGONATE) 500 MG tablet Take 500 mg by mouth daily.    [provider]  metFORMIN  (GLUCOPHAGE ) 500 MG tablet TAKE 1 TABLET BY MOUTH 2 TIMES DAILY WITH A MEAL. 11/07/23   Levora Reyes SAUNDERS, MD  Multiple Vitamin (MULTIVITAMIN) capsule Take 1 capsule by mouth daily. 11/13/12   Jacquetta Sharlot GRADE, NP  nitroGLYCERIN  (NITROSTAT ) 0.4 MG SL tablet Place 1 tablet (0.4 mg total) under the tongue every 5 (five) minutes as needed for chest pain. If chest pain not resolved, after 3 doses 5 minutes apart--call 911 Patient taking differently: Place 0.4 mg under the tongue See admin instructions. Take 0.4 mg as needed for hiccups 11/13/12   Jacquetta Sharlot GRADE, NP  Potassium 99 MG TABS Take 99 mg by mouth at bedtime.    [provider]  sertraline  (ZOLOFT ) 100 MG tablet TAKE 1 TABLET BY MOUTH  DAILY 02/01/24   Arfeen, Leni DASEN, MD  Shark Cartilage 740 MG CAPS Take 1 capsule by mouth 3 (three) times daily.    [provider]  simvastatin  (ZOCOR ) 20 MG tablet TAKE 1 TABLET BY MOUTH EVERYDAY AT BEDTIME 08/16/23   Levora Reyes SAUNDERS, MD  TECHLITE PLUS PEN NEEDLES 32G X 4 MM MISC USE ONCE DAILY WITH LANTUS . DX E11.9, Z79.4 12/07/22   Levora Reyes SAUNDERS, MD  tirzepatide  (MOUNJARO ) 7.5 MG/0.5ML Pen INJECT 7.5 MG SUBCUTANEOUSLY WEEKLY 01/23/24   Levora Reyes SAUNDERS, MD  traMADol  (ULTRAM ) 50 MG tablet Take 50 mg by mouth daily as needed for severe pain.    [provider]  traZODone  (DESYREL ) 100 MG tablet Take 1 tablet (100 mg total) by mouth at bedtime as needed for sleep. 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  Tobacco Use   Smoking status: Former    Current packs/day: 0.00    Average packs/day: 2.0 packs/day for 26.0 years (52.0 ttl pk-yrs)    Types: Cigarettes    Start date: 08/12/1977    Quit date: 08/13/2003    Years since quitting: 20.5   Smokeless tobacco: Never   Tobacco comments:    2ppd x 26 years  Vaping Use   Vaping status: Never Used  Substance and Sexual Activity   Alcohol  use: No    Alcohol /week: 0.0 standard drinks of alcohol     Comment: social   Drug use: No   Sexual activity: Yes    Partners: Male    Birth control/protection: None  Other Topics Concern   Not on file  Social History Narrative   Married. Education: college. Exercise: No.   Social Drivers of Health   Financial Resource Strain: Low Risk  (07/18/2023)   Overall Financial Resource Strain (CARDIA)    Difficulty of Paying Living Expenses: Not hard at all  Food Insecurity: No Food Insecurity  (07/18/2023)   Hunger Vital Sign  Worried About Programme researcher, broadcasting/film/video in the Last Year: Never true    Ran Out of Food in the Last Year: Never true  Transportation Needs: No Transportation Needs (07/18/2023)   PRAPARE - Administrator, Civil Service (Medical): No    Lack of Transportation (Non-Medical): No  Physical Activity: Sufficiently Active (07/18/2023)   Exercise Vital Sign    Days of Exercise per Week: 7 days    Minutes of Exercise per Session: 60 min  Stress: Stress Concern Present (07/18/2023)   [employer] of Occupational Health - Occupational Stress Questionnaire    Feeling of Stress : To some extent  Social Connections: Moderately Isolated (07/18/2023)   Social Connection and Isolation Panel    Frequency of Communication with Friends and Family: More than three times a week    Frequency of Social Gatherings with Friends and Family: Never    Attends Religious Services: Never    Database administrator or Organizations: No    Attends Engineer, structural: Not on file    Marital Status: Married  Catering manager Violence: Not on file    Review of Systems Per HPI.   Objective:   Vitals:   03/01/24 1439  BP: (!) 112/56  Pulse: 74  Resp: 17  Temp: 98.6 F (37 C)  TempSrc: Temporal  SpO2: 93%  Weight: (!) 305 lb 12.8 oz (138.7 kg)  Height: 6' 2 (1.88 m)    Physical Exam Vitals reviewed.  Constitutional:      Appearance: He is well-developed.  HENT:     Head: Normocephalic and atraumatic.  Neck:     Vascular: No carotid bruit or JVD.  Cardiovascular:     Rate and Rhythm: Normal rate and regular rhythm.     Heart sounds: Normal heart sounds. No murmur heard. Pulmonary:     Effort: Pulmonary effort is normal.     Breath sounds: Normal breath sounds. No rales.  Musculoskeletal:     Right lower leg: No edema.     Left lower leg: No edema.  Skin:    General: Skin is warm and dry.  Neurological:     Mental Status: He is alert and oriented  to person, place, and time.  Psychiatric:        Mood and Affect: Mood normal.      Assessment & Plan:  NAMIR NETO is a 63 y.o. male . Type 2 diabetes mellitus with hyperglycemia, with long-term current use of insulin  (HCC) - Plan: Hemoglobin A1c, metFORMIN  (GLUCOPHAGE ) 500 MG tablet, simvastatin  (ZOCOR ) 20 MG tablet, tirzepatide  (MOUNJARO ) 7.5 MG/0.5ML Pen  Essential hypertension - Plan: Comprehensive metabolic panel with GFR, amLODipine  (NORVASC ) 5 MG tablet, losartan -hydrochlorothiazide  (HYZAAR) 100-25 MG tablet  Hyperlipidemia, unspecified hyperlipidemia type - Plan: Comprehensive metabolic panel with GFR, Lipid panel  Chronic pain of left lower extremity - Plan: gabapentin  (NEURONTIN ) 300 MG capsule  Based on home readings diabetes appears to be under great control now and no recent hypoglycemia off of insulin .  Will continue same dose of Mounjaro , metformin , and tolerating statin, ARB HCT combo for hypertension.  Check labs and adjust regimen accordingly.  Continue follow-up with therapist and psychiatrist given home stressors and demands, and briefly discussed caregiver fatigue and resources.  46-month follow-up.  If labs stable okay to extend to 6 months if he would like.  Meds ordered this encounter  Medications   amLODipine  (NORVASC ) 5 MG tablet    Sig: Take 1 tablet (5 mg total)  by mouth daily.    Dispense:  90 tablet    Refill:  1   gabapentin  (NEURONTIN ) 300 MG capsule    Sig: TAKE 2 CAPSULES BY MOUTH 2 TIMES DAILY.    Dispense:  360 capsule    Refill:  1   losartan -hydrochlorothiazide  (HYZAAR) 100-25 MG tablet    Sig: Take 1 tablet by mouth daily.    Dispense:  90 tablet    Refill:  1   metFORMIN  (GLUCOPHAGE ) 500 MG tablet    Sig: Take 1 tablet (500 mg total) by mouth 2 (two) times daily with a meal.    Dispense:  180 tablet    Refill:  1   simvastatin  (ZOCOR ) 20 MG tablet    Sig: TAKE 1 TABLET BY MOUTH EVERYDAY AT BEDTIME    Dispense:  90 tablet     Refill:  2   tirzepatide  (MOUNJARO ) 7.5 MG/0.5ML Pen    Sig: Inject 7.5 mg into the skin once a week.    Dispense:  6 mL    Refill:  1   Patient Instructions  Thanks for coming in today.  I am happy to hear about your blood sugar control, I think you are on the right regimen at this time.  No med changes.  If any concerns on labs I will let you know.  Hang in there and let me know if I can help.    Signed,   Reyes Pines, MD [facility], [facility] 03/01/24 3:14 PM

## 2024-03-02 LAB — COMPREHENSIVE METABOLIC PANEL WITH GFR
ALT: 19 U/L (ref 0–53)
AST: 26 U/L (ref 0–37)
Albumin: 4.5 g/dL (ref 3.5–5.2)
Alkaline Phosphatase: 65 U/L (ref 39–117)
BUN: 12 mg/dL (ref 6–23)
CO2: 28 meq/L (ref 19–32)
Calcium: 9.3 mg/dL (ref 8.4–10.5)
Chloride: 102 meq/L (ref 96–112)
Creatinine, Ser: 0.93 mg/dL (ref 0.40–1.50)
GFR: 87.67 mL/min (ref >60.00)
Glucose, Bld: 88 mg/dL (ref 70–99)
Potassium: 3.6 meq/L (ref 3.5–5.1)
Sodium: 140 meq/L (ref 135–145)
Total Bilirubin: 1.1 mg/dL (ref 0.2–1.2)
Total Protein: 7.2 g/dL (ref 6.0–8.3)

## 2024-03-02 LAB — HEMOGLOBIN A1C: Hgb A1c MFr Bld: 6.1 % (ref 4.6–6.5)

## 2024-03-02 LAB — LIPID PANEL
Cholesterol: 126 mg/dL (ref 0–200)
HDL: 51.5 mg/dL (ref >39.00)
LDL Cholesterol: 56 mg/dL (ref 0–99)
NonHDL: 74.88
Total CHOL/HDL Ratio: 2
Triglycerides: 92 mg/dL (ref 0.0–149.0)
VLDL: 18.4 mg/dL (ref 0.0–40.0)

## 2024-03-06 ENCOUNTER — Ambulatory Visit: Payer: Self-pay | Admitting: Family Medicine

## 2024-03-13 ENCOUNTER — Ambulatory Visit (HOSPITAL_COMMUNITY): Admitting: Licensed Clinical Social Worker

## 2024-03-13 DIAGNOSIS — F330 Major depressive disorder, recurrent, mild: Secondary | ICD-10-CM | POA: Diagnosis not present

## 2024-03-13 DIAGNOSIS — F411 Generalized anxiety disorder: Secondary | ICD-10-CM

## 2024-03-13 NOTE — Progress Notes (Unsigned)
 THERAPIST PROGRESS NOTE   Session Date: 03/13/2024  Session Time: 1404 - 1500  Participation Level: Active  MSE/Presentation: Behavior: Appropriate and Sharing Speech: Normal Thought Process: Coherent and Relevant Cognition: Alert and Appropriate Mood: Anxious and Euthymic Affect: Congruent Insight: Good Appearance: Casual  Type of Therapy: Individual Therapy  Treatment Goals addressed:   Progressing (6) LTG: Increase coping skills to manage depression and improve ability to perform daily activities (OP Depression) STG: Reduce overall depression score by a minimum of 25% on the Patient Health Questionnaire (PHQ-9) (OP Depression) STG: Matthew Hutchinson will reduce frequency of avoidant behaviors by 50% as evidenced by self-report in therapy sessions (Anxiety) LTG: Continue to keep depression manageable when faced with the inevitable horribleness (OP Depression) LTG: Maintain abilities at managing anxiety and have minimal panic attacks (Anxiety)  Not Progressing (2) LTG: Reduce frequency, intensity, and duration of depression symptoms so that daily functioning is improved (OP Depression) STG: Matthew Hutchinson will identify cognitive patterns and beliefs that support depression (OP Depression)   Progress Towards Goals: Progressing  Interventions: CBT, Motivational Interviewing, Solution Focused, and Supportive  Summary: Matthew Hutchinson is a 63 y.o. male with psych history of MDD and GAD, presenting for follow-up therapy session in efforts to improve management of depressive and anxious symptoms.   Patient actively engaged in session, presenting in pleasant moods, and congruent affect throughout session. Pt openly engaged in introductory check-in, sharing of It's a high functioning day today, further detailing of having been navigating barriers with legal matters this morning and being unsure of where things were as of last visit, providing extensive detailing of hx of events surrounding  challenges with spouses ongoing medical needs, having been discharged from SNF and returned home approx. 2 weeks ago. Actively processed individual efforts at navigating challenges, and resources available to pt to aid in resolving stressors at this time. Explored possible availability of added resources via APS to support in meeting presenting needs of spouse in the home, processing pt's anxiousness surrounding APS and this having not been explored with DSS when previously attempting to apply for MCD for spouse. Engaged in reassessing of presenting depressive and anxious sxs, noting of increase in screening scores in accordance with ongoing crisis surrounding spouses presenting medical needs. Actively engaged in review/update of individual goals outlined in tx plan, processing progress and/or lack thereof, and continued areas for work.  Patient responded well to interventions. Patient continues to meet criteria for MDD and GAD. Patient will continue to benefit from engagement in outpatient therapy due to being the least restrictive service to meet presenting needs.      03/13/2024    2:35 PM 12/13/2023    5:02 PM 11/30/2023    3:14 PM 11/15/2023    3:54 PM  GAD 7 : Generalized Anxiety Score  Nervous, Anxious, on Edge 2 2 3 3   Control/stop worrying 3 2 3 3   Worry too much - different things 3 1 3 3   Trouble relaxing 2 1 3 3   Restless 3 1 3 3   Easily annoyed or irritable 3 2 3 3   Afraid - awful might happen 3 1 3 3   Total GAD 7 Score 19 10 21 21   Anxiety Difficulty Extremely difficult Very difficult Very difficult Extremely difficult      03/13/2024    2:38 PM 12/13/2023    5:03 PM 11/30/2023    3:13 PM 11/15/2023    3:55 PM 09/12/2023    3:54 PM  Depression screen PHQ 2/9  Decreased Interest 3  1 1 0 0  Down, Depressed, Hopeless 2 1 3 3 3   PHQ - 2 Score 5 2 4 3 3   Altered sleeping 3 0 1 3 3   Tired, decreased energy 2 0 2 3 1   Change in appetite 3 0  3 1  Feeling bad or failure about yourself  3 1 3  3 1   Trouble concentrating 3 1 3 3 3   Moving slowly or fidgety/restless 3 0 2 3 3   Suicidal thoughts 0 0 0 3 0  PHQ-9 Score 22 4 15 24 15   Difficult doing work/chores Extremely dIfficult Somewhat difficult Very difficult Extremely dIfficult Very difficult   Flowsheet Row Counselor from 11/15/2023 in [facility] from 11/24/2022 in [facility] from 10/05/2022 in [facility] at [facility] RISK CATEGORY Moderate Risk Low Risk Low Risk    Suicidal/Homicidal: None, No plan to harm self or others  Therapist Response: Clinician utilized CBT, MI, Psychoeducational, strengths based and supportive reflection interventions to support patient in efforts to address presenting sxs and challenges surrounding presenting stressors.  Clinician openly agreed to patient upon joining virtual visit, assessing presenting mood and affect. Actively engage patient in introductory check-in, utilizing open ended questions in aims of prompting patient engagement in reflection of daily and recent events of the past month since previous visit. Utilized active listening techniques to support pt in reflections of events, processing thoughts and feelings surrounding challenges and presenting stressors. Provided support and validation for pt's expressed thoughts and feelings surrounding presenting challenges, aiding pt in processing individual abilities in navigating stressors and action steps. Engaged in reassessing presenting depressive and anxious sxs via PHQ-9 and GAD-7, noting of increase in scores coinciding with chronic stress surrounding spouses presenting health challenges. Engaged pt in review of individualized tx goals in update of tx plan.  Clinician reassessed severity of presenting sxs, and presence of any safety concerns. Therapist provided support and empathy to patient during  session.  Homework: None.  Plan: Return again in 2 weeks.  Diagnosis:  Encounter Diagnoses  Name Primary?   Major depressive disorder, recurrent episode, mild (HCC) Yes   GAD (generalized anxiety disorder)      Collaboration of Care: Psychiatrist AEB provider documentation available in EHR.  Patient/Guardian was advised Release of Information must be obtained prior to any record release in order to collaborate their care with an outside provider. Patient/Guardian was advised if they have not already done so to contact the registration department to sign all necessary forms in order for us  to release information regarding their care.   Consent: Patient/Guardian gives verbal consent for treatment and assignment of benefits for services provided during this visit. Patient/Guardian expressed understanding and agreed to proceed.   Virtual Visit via Video Note  I connected with Matthew Hutchinson on 03/13/24 at  2:00 PM EDT by a video enabled telemedicine application and verified that I am speaking with the correct person using two identifiers.  Location: Patient: Parked car, rest area. Provider: Home Office   I discussed the limitations of evaluation and management by telemedicine and the availability of in person appointments. The patient expressed understanding and agreed to proceed.   The patient was advised to call back or seek an in-person evaluation if the symptoms worsen or if the condition fails to improve as anticipated.  I provided 56 minutes of non-face-to-face time during this encounter.  Matthew Hutchinson, MSW,  LCSW 03/13/2024,  2:41 PM

## 2024-03-19 DIAGNOSIS — G4733 Obstructive sleep apnea (adult) (pediatric): Secondary | ICD-10-CM | POA: Diagnosis not present

## 2024-03-26 ENCOUNTER — Ambulatory Visit: Attending: Cardiology | Admitting: Cardiology

## 2024-03-26 ENCOUNTER — Encounter: Payer: Self-pay | Admitting: Cardiology

## 2024-03-26 VITALS — BP 120/56 | HR 80 | Ht 74.5 in | Wt 310.8 lb

## 2024-03-26 DIAGNOSIS — E785 Hyperlipidemia, unspecified: Secondary | ICD-10-CM

## 2024-03-26 DIAGNOSIS — J449 Chronic obstructive pulmonary disease, unspecified: Secondary | ICD-10-CM

## 2024-03-26 DIAGNOSIS — E1169 Type 2 diabetes mellitus with other specified complication: Secondary | ICD-10-CM

## 2024-03-26 DIAGNOSIS — R600 Localized edema: Secondary | ICD-10-CM

## 2024-03-26 DIAGNOSIS — R0789 Other chest pain: Secondary | ICD-10-CM

## 2024-03-26 DIAGNOSIS — E1165 Type 2 diabetes mellitus with hyperglycemia: Secondary | ICD-10-CM

## 2024-03-26 DIAGNOSIS — I10 Essential (primary) hypertension: Secondary | ICD-10-CM | POA: Diagnosis not present

## 2024-03-26 DIAGNOSIS — I2510 Atherosclerotic heart disease of native coronary artery without angina pectoris: Secondary | ICD-10-CM

## 2024-03-26 DIAGNOSIS — I2583 Coronary atherosclerosis due to lipid rich plaque: Secondary | ICD-10-CM | POA: Diagnosis not present

## 2024-03-26 DIAGNOSIS — Z794 Long term (current) use of insulin: Secondary | ICD-10-CM

## 2024-03-26 DIAGNOSIS — G4733 Obstructive sleep apnea (adult) (pediatric): Secondary | ICD-10-CM | POA: Diagnosis not present

## 2024-03-26 NOTE — Progress Notes (Signed)
 " Cardiology Office Note:  .   Date:  03/27/2024  ID:  Matthew Hutchinson, DOB 05-31-1961, MRN [id number] PCP: Matthew Reyes SAUNDERS, MD  [facility] Providers Cardiologist:  Alm Clay, MD     Chief Complaint  Patient presents with   New Patient (Initial Visit)    Reestablish cardiology care.  Has notable risk factors: Obesity, HLD, HTN and DM-2    Patient Profile: Matthew Hutchinson is a morbidly obese 63 y.o. male with a PMH notable for HTN, HLD, DM-2, OSA and COPD who presents here to reestablish cardiology care at the request of Matthew Reyes SAUNDERS, MD.  His husband Matthew Hutchinson is my long-term patient. Matthew Hutchinson was a former patient of Dr. Jeanella seen in January 2020 (3 years out from previous visit)-referred for cardiovascular clearance for elective umbilical hernia surgery.  At that time he was wearing CPAP religiously.  They discussed his heart catheterization back in 2006 showing minimal disease.  He denied any chest pain.  Blood pressure was well-controlled.  Lipids are relatively controlled with an LDL of 99.     Matthew Hutchinson was last seen by Dr. Court in January 2020.  Prior to that he had been followed by Dr. Rolan and underwent a cardiac catheterization back in 2006 showing minimal disease.  This clinic visit was actually for preop clearance for umbilical hernia repair.  He was actively not symptomatic well from a cardiac standpoint and was using his CPAP regularly.  Only current risk factors are obesity, hypertension hyperlipidemia and OSA.  Last stress test was in 2009 that was nonischemic.  No testing ordered.  Subjective  Discussed the use of AI scribe software for clinical note transcription with the patient, who gave verbal consent to proceed.  History of Present Illness Matthew Hutchinson is a 63 year old male with coronary artery disease who presents for a follow-up visit.  He experiences chronic fatigue, which he attributes to the stress  of managing his partner's healthcare needs and insurance issues. This stress leaves him feeling tired and overwhelmed.  He has a history of coronary artery disease and carries nitroglycerin  for angina, although he has not used it recently. He experiences occasional chest 'twinges' not related to exertion and has not taken nitroglycerin  for these episodes. He underwent a heart catheterization in 2006, which showed minimal findings, and a nuclear stress test in 2009. He is currently on amlodipine  5 mg, HCTZ 100/25 mg, and simvastatin  420 mg. No significant changes in his cardiac symptoms are reported. He reports occasional sensations of a racing heart, which he attributes to stress, but does not describe palpitations or arrhythmias.  He suffers from chronic hiccups for about three years, which are relieved by nitroglycerin , but frequent use leads to headaches. He has a Zanker's diverticulum, confirmed by endoscopy.  He has a history of diabetes, previously on insulin , but now managed with Mounjaro , resulting in significant weight loss from 365 lbs to 310 lbs. He reports stress eating recently, which has affected his weight loss progress.  He experiences swelling in his legs, particularly on days with increased activity, and uses compression socks to manage this. He reports occasional shortness of breath with exertion, particularly when getting up and down, and uses a cane for mobility. He has a history of arthritis and a previous foot fracture, which contribute to his mobility issues.   Cardiovascular ROS: positive for - chest pain, dyspnea on exertion, edema, rapid heart rate, and most  of these are described above negative for - orthopnea, paroxysmal nocturnal dyspnea, shortness of breath, or syncope or near syncope TIA or amaurosis fugax, claudication  ROS:  Review of Systems - Negative except mostly activity be limited by significant musculoskeletal pain.  Notably his left leg that had extensive  surgery done after an accident years ago.  He describes how hard it is for him to get up and down from the ground however it difficult just for him to care for his husband Matthew Hutchinson.  He is under quite a bit of stress trying to still work mostly part-time now in order to keep his insurance, although while trying to fight for [employer] care.  His weight has been up and down said that he had been somewhere between 365 to 390 pounds then but I will be down to 290 pounds and is now 20 pounds up from that because of stress eating     Objective   Pertinent Medications: BP: Amlodipine  5 mg daily, losartan /HCTZ 100-25 mg daily. Hyperlipidemia: Simvastatin  20 Miller daily DM-2: Mounjaro  7.5 mg weekly, metformin  500 mg twice daily. Annuity Ellipta inhaler-1 puff daily Vistaril  50 mg 2 capsules nightly Lamictal  20 mg daily; sertraline  100 mg daily; trazodone  100 mg nightly as needed.;  Ultram  50 mg as needed pain Magnesium  gluconate 500 daily; lutein  40 mg nightly;  Studies Reviewed: Matthew   EKG Interpretation Date/Time:  Monday March 26 2024 14:10:03 EDT Ventricular Rate:  80 PR Interval:  210 QRS Duration:  82 QT Interval:  374 QTC Calculation: 431 R Axis:   7  Text Interpretation: Sinus rhythm with 1st degree A-V block Possible Inferior infarct (cited on or before 20-Mar-2008) Anterior infarct , age undetermined When compared with ECG of 20-Mar-2008 08:22, Possible Anterior infarct is now Present Confirmed by Anner Lenis ([id number]) on 03/26/2024 2:24:00 PM    Lab Results  Component Value Date   CHOL 126 03/01/2024   HDL 51.50 03/01/2024   LDLCALC 56 03/01/2024   TRIG 92.0 03/01/2024   CHOLHDL 2 03/01/2024   Lab Results  Component Value Date   NA 140 03/01/2024   K 3.6 03/01/2024   CREATININE 0.93 03/01/2024   GFR 87.67 03/01/2024   GLUCOSE 88 03/01/2024   Lab Results  Component Value Date   HGBA1C 6.1 03/01/2024   CATH (10/28/2004): Large-caliber LM.  Normal.  LAD courses to the apex  and has 2 diagonal branches.  No disease.  D1 20% medium size.  D2 medium size no disease.  Medium size RI that bifurcates distally.  No disease.  Medium size LCx to OM's and AV groove circumflex without stenoses.  OM 3 is small with no significant disease.  Dominant RCA is a moderate caliber vessel that bifurcates distally into PDA and PAV-PL branch-mid RCA 30%.  EF estimated 40 to 45%. Myoview (04/25/2008): Inferior perfusion defect consistent with diaphragmatic attenuation.  Otherwise no ischemia or infarction.  EF 57%.  LOW RISK   Risk Assessment/Calculations:             Physical Exam:   VS:  BP (!) 120/56 (BP Location: Right Arm, Patient Position: Sitting, Cuff Size: Large)   Pulse 80   Ht 6' 2.5 (1.892 m)   Wt (!) 310 lb 12.8 oz (141 kg)   SpO2 93%   BMI 39.37 kg/m    Wt Readings from Last 3 Encounters:  03/26/24 (!) 310 lb 12.8 oz (141 kg)  03/01/24 (!) 305 lb 12.8 oz (138.7 kg)  11/30/23 291  lb (132 kg)     GEN: ~Morbidly obese, well groomed; he seems tired and somewhat emotionally spent but is in no acute distress. NECK: No JVD; No carotid bruits CARDIAC: Distant heart sounds but mostly normal S1, S2; RRR, no murmurs, rubs, gallops RESPIRATORY:  Clear to auscultation without rales, wheezing or rhonchi ; nonlabored, good air movement. ABDOMEN: Soft, non-tender, non-distended EXTREMITIES: Trivial ankle edema; walks with a cane, favoring left leg.  Visually there does not seem to be any abnormality but his antalgic gait is prominent.     ASSESSMENT AND PLAN: .    Problem List Items Addressed This Visit       Cardiology Problems   Coronary artery disease, non-occlusive - Primary (Chronic)   Intermittent chest pain not exertion-related. Previous catheterization showed minimal disease. Risk mitigated by controlled blood pressure, cholesterol, and diabetes. - Continue aspirin  therapy. - Continue current management of blood pressure, cholesterol, and diabetes all of which  are well-controlled:  BP: Amlodipine  5 mg daily, losartan /HCTZ 100-25 mg daily. Hyperlipidemia: Simvastatin  20 Miller daily DM-2: Mounjaro  7.5 mg weekly, metformin  500 mg twice daily.      Relevant Orders   EKG 12-Lead (Completed)   Essential hypertension (Chronic)   Blood pressure well controlled on amlodipine  5 mg daily and losartan  HCTZ 100-25 mg daily. - Continue amlodipine  and losartan  HCTZ.      Relevant Orders   EKG 12-Lead (Completed)   Hyperlipidemia associated with type 2 diabetes mellitus (HCC) (Chronic)   Blood sugars well controlled with A1c of 6.1. Recent weight loss with some regain due to stress eating. No longer on insulin , currently on Mounjaro  plus metformin . - Continue Mounjaro  and metformin  per PCP.  Hyperlipidemia-well-controlled on simvastatin  20 mg daily.  Most recent LDL was 56.  Well within goal. -Continue simvastatin         Other   Chest wall pain   Pain related to arthritis and previous injuries, varies with activity and caregiving responsibilities.      COPD mixed type (HCC) (Chronic)   Followed by Dr. Neysa.  Continue annuity Ellipta      Relevant Orders   EKG 12-Lead (Completed)   Lower extremity edema   Morbid obesity (HCC)   Obesity contributes to overall health. Recent weight loss with some regain due to stress eating. - Continues Mounjaro  - Encourage continued weight management efforts.      Obstructive sleep apnea (Chronic)   Relevant Orders   EKG 12-Lead (Completed)   Type 2 diabetes mellitus with hyperglycemia, with long-term current use of insulin  (HCC) (Chronic)   Relevant Orders   EKG 12-Lead (Completed)             Follow-Up: Return in about 1 year (around 03/26/2025).    Signed, Alm MICAEL Clay, MD, MS Alm Clay, M.D., M.S. Interventional Cardiologist  [facility] Pager # [phone]      "

## 2024-03-26 NOTE — Patient Instructions (Addendum)

## 2024-03-27 ENCOUNTER — Ambulatory Visit (INDEPENDENT_AMBULATORY_CARE_PROVIDER_SITE_OTHER): Admitting: Licensed Clinical Social Worker

## 2024-03-27 ENCOUNTER — Encounter: Payer: Self-pay | Admitting: Cardiology

## 2024-03-27 DIAGNOSIS — F330 Major depressive disorder, recurrent, mild: Secondary | ICD-10-CM | POA: Diagnosis not present

## 2024-03-27 DIAGNOSIS — F411 Generalized anxiety disorder: Secondary | ICD-10-CM | POA: Diagnosis not present

## 2024-03-27 DIAGNOSIS — R600 Localized edema: Secondary | ICD-10-CM | POA: Insufficient documentation

## 2024-03-27 DIAGNOSIS — R0789 Other chest pain: Secondary | ICD-10-CM | POA: Insufficient documentation

## 2024-03-27 NOTE — Assessment & Plan Note (Addendum)
 Blood sugars well controlled with A1c of 6.1. Recent weight loss with some regain due to stress eating. No longer on insulin , currently on Mounjaro  plus metformin . - Continue Mounjaro  and metformin  per PCP.  Hyperlipidemia-well-controlled on simvastatin  20 mg daily.  Most recent LDL was 56.  Well within goal. -Continue simvastatin 

## 2024-03-27 NOTE — Assessment & Plan Note (Signed)
 Pain related to arthritis and previous injuries, varies with activity and caregiving responsibilities.

## 2024-03-27 NOTE — Assessment & Plan Note (Signed)
 Followed by Dr. Neysa.  Continue annuity Ellipta

## 2024-03-27 NOTE — Assessment & Plan Note (Signed)
 Obesity contributes to overall health. Recent weight loss with some regain due to stress eating. - Continues Mounjaro  - Encourage continued weight management efforts.

## 2024-03-27 NOTE — Assessment & Plan Note (Signed)
 Blood pressure well controlled on amlodipine  5 mg daily and losartan  HCTZ 100-25 mg daily. - Continue amlodipine  and losartan  HCTZ.

## 2024-03-27 NOTE — Assessment & Plan Note (Signed)
 Intermittent chest pain not exertion-related. Previous catheterization showed minimal disease. Risk mitigated by controlled blood pressure, cholesterol, and diabetes. - Continue aspirin  therapy. - Continue current management of blood pressure, cholesterol, and diabetes all of which are well-controlled:  BP: Amlodipine  5 mg daily, losartan /HCTZ 100-25 mg daily. Hyperlipidemia: Simvastatin  20 Miller daily DM-2: Mounjaro  7.5 mg weekly, metformin  500 mg twice daily.

## 2024-03-27 NOTE — Progress Notes (Unsigned)
 THERAPIST PROGRESS NOTE   Session Date: 03/27/2024  Session Time: 1405 - 1513  Participation Level: Active  MSE/Presentation: Behavior: Appropriate and Sharing Speech: Normal Thought Process: Coherent and Relevant Cognition: Alert and Appropriate Mood: Anxious and Euthymic Affect: Congruent Insight: Good Appearance: Casual  Type of Therapy: Individual Therapy  Treatment Goals addressed:   Progressing (6) LTG: Increase coping skills to manage depression and improve ability to perform daily activities (OP Depression) STG: Reduce overall depression score by a minimum of 25% on the Patient Health Questionnaire (PHQ-9) (OP Depression) STG: Duston will reduce frequency of avoidant behaviors by 50% as evidenced by self-report in therapy sessions (Anxiety) LTG: Continue to keep depression manageable when faced with the inevitable horribleness (OP Depression) LTG: Maintain abilities at managing anxiety and have minimal panic attacks (Anxiety)  Not Progressing (2) LTG: Reduce frequency, intensity, and duration of depression symptoms so that daily functioning is improved (OP Depression) STG: Ulyses will identify cognitive patterns and beliefs that support depression (OP Depression)   Progress Towards Goals: Progressing  Interventions: CBT, Motivational Interviewing, Solution Focused, and Supportive  Summary: WINFORD HEHN is a 63 y.o. male with psych history of MDD and GAD, presenting for follow-up therapy session in efforts to improve management of depressive and anxious symptoms.   Patient actively engaged in session, presenting in pleasant moods, and congruent affect throughout session. Pt openly engaged in introductory check-in, sharing of I'm okay, stressed out of my brain, but I'm okay, further detailing of being behind on business responsibilities due to having to care for spouse. Pt shared of just dealing with by keeping busy, keeping occupied with addressing  presenting needs throughout the day, finding self able to sit down and eat later in the evenings, and occasional playing video games to help disconnect from stressors. Actively processed maintained stress experienced over the past 10 months that has resulted in pt's onging heightened anxiety and depressed moods, acknowledging of overall inability to focus on self due to lack of support for spouse. 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 Pt processed stress surrounding lack of approved care, expressing optimism for social worker with care agency to be coming to assess needs in the coming days. Pt expressed gratitude towards clinician and continued med man via Arfeen, MD in providing some sense of stability and support through constant crisis.  Patient responded well to interventions. Patient continues to meet criteria for MDD and GAD. Patient will continue to benefit from engagement in outpatient therapy due to being the least restrictive service to meet presenting needs.      03/13/2024    2:35 PM 12/13/2023    5:02 PM 11/30/2023    3:14 PM 11/15/2023    3:54 PM  GAD 7 : Generalized Anxiety Score  Nervous, Anxious, on Edge 2 2 3 3   Control/stop worrying 3 2 3 3   Worry too much - different things 3 1 3 3   Trouble relaxing 2 1 3 3   Restless 3 1 3 3   Easily annoyed or irritable 3 2 3 3   Afraid - awful might happen 3 1 3 3   Total GAD 7 Score 19 10 21 21   Anxiety Difficulty Extremely difficult Very difficult Very difficult Extremely difficult      03/13/2024    2:38 PM 12/13/2023    5:03 PM 11/30/2023  3:13 PM 11/15/2023    3:55 PM 09/12/2023    3:54 PM  Depression screen PHQ 2/9  Decreased Interest 3 1 1  0 0  Down, Depressed, Hopeless 2 1 3 3 3   PHQ - 2 Score 5 2 4 3 3   Altered sleeping 3 0 1 3 3    Tired, decreased energy 2 0 2 3 1   Change in appetite 3 0  3 1  Feeling bad or failure about yourself  3 1 3 3 1   Trouble concentrating 3 1 3 3 3   Moving slowly or fidgety/restless 3 0 2 3 3   Suicidal thoughts 0 0 0 3 0  PHQ-9 Score 22 4 15 24 15   Difficult doing work/chores Extremely dIfficult Somewhat difficult Very difficult Extremely dIfficult Very difficult   Flowsheet Row Counselor from 11/15/2023 in [facility] from 11/24/2022 in [facility] at [facility] from 10/05/2022 in [facility] at [facility] RISK CATEGORY Moderate Risk Low Risk Low Risk    Suicidal/Homicidal: None, No plan to harm self or others  Therapist Response: Clinician utilized CBT, MI, Psychoeducational, strengths based and supportive reflection interventions to support patient in efforts to address presenting sxs and challenges surrounding presenting stressors.  Clinician openly agreed to patient upon joining virtual visit, assessing presenting mood and affect. Engaged patient in introductory check-in, utilizing open ended questions in eliciting recounts of daily and recent events of recent weeks, presentation of newly added stressors, status of ongoing challenges, and individual management of presenting stress. Utilized active listening techniques to support pt in recounts of events, processing thoughts and feelings in relationship to challenges, implications on moods, and individual management of emotions/moods. Utilized psychoed, CBT, MI, and supportive reflection techniques to aid pt in processing difficulties and identifying minimal progressions in reduction of stress. Pt proves to maintain minimal progress towards goals.  Homework: None.  Plan: Return again in 2 weeks.  Diagnosis:  Encounter Diagnoses  Name Primary?   GAD (generalized anxiety disorder) Yes   Major depressive disorder,  recurrent episode, mild (HCC)    Collaboration of Care: Psychiatrist AEB provider documentation available in EHR.  Patient/Guardian was advised Release of Information must be obtained prior to any record release in order to collaborate their care with an outside provider. Patient/Guardian was advised if they have not already done so to contact the registration department to sign all necessary forms in order for us  to release information regarding their care.   Consent: Patient/Guardian gives verbal consent for treatment and assignment of benefits for services provided during this visit. Patient/Guardian expressed understanding and agreed to proceed.   Virtual Visit via Video Note  I connected with Lazarius Rivkin Benton-Elliot on 03/27/24 at  2:00 PM EDT by a video enabled telemedicine application and verified that I am speaking with the correct person using two identifiers.  Location: Patient: Home Provider: Home Office   I discussed the limitations of evaluation and management by telemedicine and the availability of in person appointments. The patient expressed understanding and agreed to proceed.   The patient was advised to call back or seek an in-person evaluation if the symptoms worsen or if the condition fails to improve as anticipated.  I provided 68 minutes of non-face-to-face time during this encounter.  Lynwood JONETTA Maris, MSW, LCSW 03/27/2024,  2:25 PM

## 2024-04-03 ENCOUNTER — Ambulatory Visit (HOSPITAL_COMMUNITY): Admitting: Licensed Clinical Social Worker

## 2024-04-03 DIAGNOSIS — F411 Generalized anxiety disorder: Secondary | ICD-10-CM

## 2024-04-03 DIAGNOSIS — F330 Major depressive disorder, recurrent, mild: Secondary | ICD-10-CM
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
## 2024-04-05 ENCOUNTER — Other Ambulatory Visit (HOSPITAL_COMMUNITY)
Admission: RE | Admit: 2024-04-05 | Discharge: 2024-04-05 | Disposition: A | Discharge status: Home / Self Care | Payer: Self-pay | Source: Ambulatory Visit | Attending: Medical Genetics | Admitting: Medical Genetics

## 2024-04-09 ENCOUNTER — Ambulatory Visit: Payer: BC Managed Care – PPO | Admitting: Internal Medicine

## 2024-04-11 ENCOUNTER — Encounter: Payer: Self-pay | Admitting: Nurse Practitioner

## 2024-04-11 ENCOUNTER — Ambulatory Visit: Admitting: Nurse Practitioner

## 2024-04-11 VITALS — BP 146/79 | HR 79 | Temp 98.2°F | Ht 74.5 in | Wt 310.6 lb

## 2024-04-11 DIAGNOSIS — G4733 Obstructive sleep apnea (adult) (pediatric): Secondary | ICD-10-CM | POA: Diagnosis not present

## 2024-04-11 DIAGNOSIS — J449 Chronic obstructive pulmonary disease, unspecified: Secondary | ICD-10-CM

## 2024-04-11 DIAGNOSIS — Z23 Encounter for immunization: Secondary | ICD-10-CM | POA: Diagnosis not present

## 2024-04-11 NOTE — Patient Instructions (Addendum)
 Continue to use CPAP every night, minimum of 4-6 hours a night.  Change equipment as directed. Wash your tubing with warm soap and water daily, hang to dry. Wash humidifier portion weekly. Use bottled, distilled water and change daily Be aware of reduced alertness and do not drive or operate heavy machinery if experiencing this or drowsiness.  Exercise encouraged, as tolerated. Healthy weight management discussed.  Avoid or decrease alcohol  consumption and medications that make you more sleepy, if possible. Notify if persistent daytime sleepiness occurs even with consistent use of PAP therapy.  Change CPAP supplies... Every month Mask cushions and/or nasal pillows CPAP machine filters Every 3 months Mask frame (not including the headgear) CPAP tubing Every 6 months Mask headgear Chin strap (if applicable) Humidifier water tub  Flu shot today  Ok to try off inhalers and see if you notice a difference  If you feel breathing worsens without it, resume Arnuity   Follow up in one year with Dr. Neda or Izetta Malachy PIETY, or sooner if needed

## 2024-04-11 NOTE — Progress Notes (Signed)
 "  @Patient  ID: Matthew Hutchinson, male    DOB: 13-Mar-1961, 63 y.o.   MRN: [id number]  Chief Complaint  Patient presents with   Obstructive Sleep Apnea    1 year follow up cpap    Referring provider: Levora Reyes SAUNDERS, MD  HPI: 63 year old male, former smoker followed for COPD mixed type and OSA on CPAP. He is a patient of Dr. Saundra and last seen in office 04/07/2023. Past medical history significant for HTN, CAD, DM, HLD, depression, GAD, chronic pain.   TEST/EVENTS:   04/07/2023: OV with Dr. Neysa. AHI 2.4/h residual on download with 100% compliance. Using trazodone  100 mg at night. Happy with his machine. Minor occasional wheeze. Inhalers no help. Consider full PFT if symptoms persist/worsen.  04/11/2024: Today - follow up Discussed the use of AI scribe software for clinical note transcription with the patient, who gave verbal consent to proceed.  History of Present Illness Matthew Hutchinson is a 63 year old male with who presents for yearly follow-up.  He uses his CPAP machine nightly without issues, and usage data indicates good control of his sleep apnea. No issues with drowsy driving. Energy levels decent during the day. No excessive sleepiness.   He was placed back on an inhaler (Arnuity) six months ago by his PCP, but it has not made a significant difference in his symptoms. He previously tried a nebulizer, which worsened his breathing.   He experiences breathing difficulty with significant exertion, which he attributes to a combination of factors including leg issues/pain. He lives in a one-floor house to avoid stairs and does not engage in activities like uphill climbing. Feels like his breathing has been stable. Has a very occasional wheeze, that is not bothersome. No rescue inhaler. No cough, chest congestion, CP.  He recently sustained a rib fracture from a fall two weeks ago, causing pain with deep breaths, but feels he is recovering well.   He has kept up with  all COVID vaccinations and boosters, experiencing only one adverse reaction. Due for flu vaccine.  03/13/2024-04/11/2024: CPAP 5-15 cmH2O >4 hr use 96.8%, average use 9 hr 57 min AHI 2.3/h    Allergies  Allergen Reactions   Penicillins Anaphylaxis   Valsartan Itching and Swelling    Immunization History  Administered Date(s) Administered   Hepatitis B 10/02/2010, 11/02/2010, 03/08/2011   Influenza Split 04/11/2012   Influenza Whole 05/12/2010   Influenza, Seasonal, Injecte, Preservative Fre 04/20/2023, 04/11/2024   Influenza,inj,Quad PF,6+ Mos 04/30/2013, 05/23/2014, 06/02/2015, 06/10/2016, 05/09/2018, 03/16/2019, 03/28/2020, 03/25/2021, 03/31/2022   PFIZER(Purple Top)SARS-COV-2 Vaccination 09/15/2019, 10/06/2019, 04/08/2020, 04/03/2021, 11/26/2021   PNEUMOCOCCAL CONJUGATE-20 11/30/2023   Pfizer Covid Bivalent Pediatric Vaccine(40mos to <18yrs) 04/26/2022   Pfizer(Comirnaty)Fall Seasonal Vaccine 12 years and older 04/26/2022, 04/24/2023   Pneumococcal Conjugate-13 06/02/2015   Pneumococcal Polysaccharide-23 02/17/2009, 05/12/2010, 11/04/2017   Respiratory Syncytial Virus Vaccine,Recomb Aduvanted(Arexvy) 04/26/2022   Td 08/27/2004   Tdap 03/06/2015   Zoster Recombinant(Shingrix) 03/25/2021, 06/24/2021    Past Medical History:  Diagnosis Date   Allergy    Anxiety    Arthritis    COPD (chronic obstructive pulmonary disease) (HCC)    Depression    Emphysema of lung (HCC)    Hyperlipidemia    Morbid obesity (HCC)    OSA (obstructive sleep apnea)    uses CPAP nightly   PONV (postoperative nausea and vomiting)    Unspecified essential hypertension     Tobacco History: Social History   Tobacco Use  Smoking Status Former  Current packs/day: 0.00   Average packs/day: 2.0 packs/day for 26.0 years (52.0 ttl pk-yrs)   Types: Cigarettes   Start date: 08/12/1977   Quit date: 08/13/2003   Years since quitting: 20.6  Smokeless Tobacco Never  Tobacco Comments   2ppd x 26 years    Counseling given: Not Answered Tobacco comments: 2ppd x 26 years   Outpatient Medications Prior to Visit  Medication Sig Dispense Refill   amLODipine  (NORVASC ) 5 MG tablet Take 1 tablet (5 mg total) by mouth daily. 90 tablet 1   ARNUITY ELLIPTA  100 MCG/ACT AEPB INHALE 1 PUFF INTO THE LUNGS DAILY 90 each 1   aspirin  81 MG tablet Take 1 tablet (81 mg total) by mouth daily. 30 tablet 0   b complex vitamins tablet Take 1 tablet by mouth daily.     calcium  gluconate 500 MG tablet Take 500 mg by mouth daily.     Carboxymethylcellul-Glycerin (LUBRICATING EYE DROPS OP) Place 1 drop into both eyes daily as needed (dry eyes).     Cholecalciferol  (VITAMIN D ) 2000 UNITS tablet Take 1 tablet (2,000 Units total) by mouth daily. 30 tablet 0   Continuous Glucose Sensor (FREESTYLE LIBRE 3 SENSOR) MISC Apply as instructed, change every 10 days. 1 each 0   Continuous Glucose Sensor (FREESTYLE LIBRE 3 SENSOR) MISC APPLY AS INSTRUCTED, CHANGE EVERY 10 DAYS. 4 each 0   Continuous Glucose Sensor (FREESTYLE LIBRE 3 SENSOR) MISC APPLY AS INSTRUCTED, CHANGE EVERY 14 DAYS 6 each 2   gabapentin  (NEURONTIN ) 300 MG capsule TAKE 2 CAPSULES BY MOUTH 2 TIMES DAILY. 360 capsule 1   GARLIC PO Take 100 mg by mouth daily.     hydrOXYzine  (VISTARIL ) 50 MG capsule TAKE 2 CAPSULES BY MOUTH AT BEDTIME 186 capsule 0   lamoTRIgine  (LAMICTAL ) 200 MG tablet Take 1 tablet (200 mg total) by mouth daily. 93 tablet 0   losartan -hydrochlorothiazide  (HYZAAR) 100-25 MG tablet Take 1 tablet by mouth daily. 90 tablet 1   Lutein  40 MG CAPS Take 40 mg by mouth at bedtime.     magnesium  gluconate (MAGONATE) 500 MG tablet Take 500 mg by mouth daily.     metFORMIN  (GLUCOPHAGE ) 500 MG tablet Take 1 tablet (500 mg total) by mouth 2 (two) times daily with a meal. 180 tablet 1   Multiple Vitamin (MULTIVITAMIN) capsule Take 1 capsule by mouth daily. 30 capsule 0   nitroGLYCERIN  (NITROSTAT ) 0.4 MG SL tablet Place 1 tablet (0.4 mg total) under the  tongue every 5 (five) minutes as needed for chest pain. If chest pain not resolved, after 3 doses 5 minutes apart--call 911 30 tablet 0   Potassium 99 MG TABS Take 99 mg by mouth at bedtime.     sertraline  (ZOLOFT ) 100 MG tablet TAKE 1 TABLET BY MOUTH DAILY 93 tablet 0   Shark Cartilage 740 MG CAPS Take 1 capsule by mouth 3 (three) times daily.     simvastatin  (ZOCOR ) 20 MG tablet TAKE 1 TABLET BY MOUTH EVERYDAY AT BEDTIME 90 tablet 2   TECHLITE PLUS PEN NEEDLES 32G X 4 MM MISC USE ONCE DAILY WITH LANTUS . DX E11.9, Z79.4 100 each 3   tirzepatide  (MOUNJARO ) 7.5 MG/0.5ML Pen Inject 7.5 mg into the skin once a week. 6 mL 1   traMADol  (ULTRAM ) 50 MG tablet Take 50 mg by mouth daily as needed for severe pain.     traZODone  (DESYREL ) 100 MG tablet Take 1 tablet (100 mg total) by mouth at bedtime as needed for sleep.  93 tablet 0   Glucagon  (GVOKE HYPOPEN  1-PACK) 1 MG/0.2ML SOAJ Inject 1 mg into the skin once as needed for up to 1 dose. 0.2 mL 1   No facility-administered medications prior to visit.     Review of Systems: As above    Physical Exam:  BP (!) 146/79   Pulse 79   Temp 98.2 F (36.8 C) (Oral)   Ht 6' 2.5 (1.892 m)   Wt (!) 310 lb 9.6 oz (140.9 kg)   SpO2 92%   BMI 39.35 kg/m   GEN: Pleasant, interactive, well-appearing; obese; in no acute distress HEENT:  Normocephalic and atraumatic. PERRLA. Sclera white. Nasal turbinates pink, moist and patent bilaterally. No rhinorrhea present. Oropharynx pink and moist, without exudate or edema. No lesions, ulcerations, or postnasal drip.  NECK:  Supple w/ fair ROM. No lymphadenopathy.   CV: RRR, no m/r/g PULMONARY:  Unlabored, regular breathing. Clear bilaterally A&P w/o wheezes/rales/rhonchi.  GI: BS present and normoactive. Soft, non-tender to palpation.  MSK: No erythema, warmth or tenderness. Cap refil <2 sec all extrem.  Neuro: A/Ox3. No focal deficits noted.   Skin: Warm, no lesions or rashe Psych: Normal affect and behavior.  Judgement and thought content appropriate.     Lab Results:  CBC    Component Value Date/Time   WBC WILL FOLLOW 12/18/2016 0835   WBC 4.0 05/23/2014 0958   RBC WILL FOLLOW 12/18/2016 0835   RBC 5.50 05/23/2014 0958   HGB WILL FOLLOW 12/18/2016 0835   HCT WILL FOLLOW 12/18/2016 0835   PLT WILL FOLLOW 12/18/2016 0835   MCV WILL FOLLOW 12/18/2016 0835   MCH WILL FOLLOW 12/18/2016 0835   MCH 29.5 05/23/2014 0958   MCHC WILL FOLLOW 12/18/2016 0835   MCHC 35.0 05/23/2014 0958   RDW WILL FOLLOW 12/18/2016 0835   LYMPHSABS WILL FOLLOW 12/18/2016 0835   MONOABS 0.4 05/23/2014 0958   EOSABS WILL FOLLOW 12/18/2016 0835   BASOSABS WILL FOLLOW 12/18/2016 0835    BMET    Component Value Date/Time   NA 140 03/01/2024 1525   NA 142 08/28/2020 1601   K 3.6 03/01/2024 1525   CL 102 03/01/2024 1525   CO2 28 03/01/2024 1525   GLUCOSE 88 03/01/2024 1525   BUN 12 03/01/2024 1525   BUN 9 08/28/2020 1601   CREATININE 0.93 03/01/2024 1525   CREATININE 1.00 06/10/2016 0838   CALCIUM  9.3 03/01/2024 1525   GFRNONAA 87 08/28/2020 1601   GFRNONAA 84 06/10/2016 0838   GFRAA 101 08/28/2020 1601   GFRAA >89 06/10/2016 0838    BNP No results found for: BNP   Imaging:  No results found.  Administration History     None           No data to display          No results found for: NITRICOXIDE      Assessment & Plan:   Obstructive sleep apnea OSA on CPAP. Excellent compliance and control. Receives benefit from use. Aware of proper care/use of device. Understands risks of untreated OSA. Healthy weight management encouraged. Safe driving practices reviewed.   Patient Instructions  Continue to use CPAP every night, minimum of 4-6 hours a night.  Change equipment as directed. Wash your tubing with warm soap and water daily, hang to dry. Wash humidifier portion weekly. Use bottled, distilled water and change daily Be aware of reduced alertness and do not drive or operate  heavy machinery if experiencing this or drowsiness.  Exercise encouraged,  as tolerated. Healthy weight management discussed.  Avoid or decrease alcohol  consumption and medications that make you more sleepy, if possible. Notify if persistent daytime sleepiness occurs even with consistent use of PAP therapy.  Change CPAP supplies... Every month Mask cushions and/or nasal pillows CPAP machine filters Every 3 months Mask frame (not including the headgear) CPAP tubing Every 6 months Mask headgear Chin strap (if applicable) Humidifier water tub  Flu shot today  Ok to try off inhalers and see if you notice a difference  If you feel breathing worsens without it, resume Arnuity   Follow up in one year with Dr. Neda or Izetta Sahily Biddle,NP, or sooner if needed   COPD mixed type (HCC) No significant benefit from inhalers. Advised he could trial off and reassess symptoms. If he has worsening DOE or wheezing, instructed to resume. Action plan in place. No exacerbations. Flu shot today.   Advised if symptoms do not improve or worsen, to please contact office for sooner follow up or seek emergency care.   I spent 25 minutes of dedicated to the care of this patient on the date of this encounter to include pre-visit review of records, face-to-face time with the patient discussing conditions above, post visit ordering of testing, clinical documentation with the electronic health record, making appropriate referrals as documented, and communicating necessary findings to members of the patients care team.  Comer LULLA Rouleau, NP 04/13/2024  Pt aware and understands NP's role.   "

## 2024-04-13 ENCOUNTER — Other Ambulatory Visit: Payer: Self-pay | Admitting: Family Medicine

## 2024-04-13 ENCOUNTER — Encounter: Payer: Self-pay | Admitting: Nurse Practitioner

## 2024-04-13 ENCOUNTER — Encounter: Payer: Self-pay | Admitting: Family Medicine

## 2024-04-13 DIAGNOSIS — J449 Chronic obstructive pulmonary disease, unspecified: Secondary | ICD-10-CM

## 2024-04-13 DIAGNOSIS — R059 Cough, unspecified: Secondary | ICD-10-CM

## 2024-04-13 NOTE — Assessment & Plan Note (Signed)
 No significant benefit from inhalers. Advised he could trial off and reassess symptoms. If he has worsening DOE or wheezing, instructed to resume. Action plan in place. No exacerbations. Flu shot today.

## 2024-04-13 NOTE — Assessment & Plan Note (Signed)
 OSA on CPAP. Excellent compliance and control. Receives benefit from use. Aware of proper care/use of device. Understands risks of untreated OSA. Healthy weight management encouraged. Safe driving practices reviewed.   Patient Instructions  Continue to use CPAP every night, minimum of 4-6 hours a night.  Change equipment as directed. Wash your tubing with warm soap and water daily, hang to dry. Wash humidifier portion weekly. Use bottled, distilled water and change daily Be aware of reduced alertness and do not drive or operate heavy machinery if experiencing this or drowsiness.  Exercise encouraged, as tolerated. Healthy weight management discussed.  Avoid or decrease alcohol  consumption and medications that make you more sleepy, if possible. Notify if persistent daytime sleepiness occurs even with consistent use of PAP therapy.  Change CPAP supplies... Every month Mask cushions and/or nasal pillows CPAP machine filters Every 3 months Mask frame (not including the headgear) CPAP tubing Every 6 months Mask headgear Chin strap (if applicable) Humidifier water tub  Flu shot today  Ok to try off inhalers and see if you notice a difference  If you feel breathing worsens without it, resume Arnuity   Follow up in one year with Dr. Neda or Izetta Malachy PIETY, or sooner if needed

## 2024-04-17 ENCOUNTER — Ambulatory Visit (HOSPITAL_COMMUNITY): Admitting: Licensed Clinical Social Worker

## 2024-04-17 ENCOUNTER — Encounter (HOSPITAL_COMMUNITY): Payer: Self-pay

## 2024-04-17 LAB — GENECONNECT MOLECULAR SCREEN: Genetic Analysis Overall Interpretation: NEGATIVE

## 2024-04-17 NOTE — Progress Notes (Signed)
 THERAPIST PROGRESS NOTE   Session Date: 04/17/2024  Session Time: 1400  Zachary DASEN Benton-Elliot    Clinician attempted to connect with patient for scheduled appointment via Careg[facility] video, sending text request x2 with no response.     Attempt 1: Text: 1408   Attempt 2: Text: 1410    Disconnected video visit at:  1412     Per [facility] policy, after multiple attempts to reach patient unsuccessfully at appointed time, visit will be coded as a no show.  Lynwood JONETTA Maris, MSW, LCSW 04/17/2024,  2:12 PM

## 2024-04-23 ENCOUNTER — Ambulatory Visit (HOSPITAL_COMMUNITY): Admitting: Licensed Clinical Social Worker

## 2024-04-23 DIAGNOSIS — F411 Generalized anxiety disorder: Secondary | ICD-10-CM | POA: Diagnosis not present

## 2024-04-23 DIAGNOSIS — F330 Major depressive disorder, recurrent, mild: Secondary | ICD-10-CM | POA: Diagnosis not present

## 2024-04-23 NOTE — Progress Notes (Unsigned)
 THERAPIST PROGRESS NOTE   Session Date: 04/23/2024  Session Time: 1512 - 1613  Participation Level: Active  MSE/Presentation: Behavior: Appropriate and Sharing Speech: Normal Thought Process: Coherent and Relevant Cognition: Alert and Appropriate Mood: Depressed and tearful Affect: Congruent Insight: Good Appearance: Disheveled  Type of Therapy: Individual Therapy  Treatment Goals addressed:   Progressing (6) LTG: Increase coping skills to manage depression and improve ability to perform daily activities (OP Depression) STG: Reduce overall depression score by a minimum of 25% on the Patient Health Questionnaire (PHQ-9) (OP Depression) STG: Matthew Hutchinson will reduce frequency of avoidant behaviors by 50% as evidenced by self-report in therapy sessions (Anxiety) LTG: Continue to keep depression manageable when faced with the inevitable horribleness (OP Depression) LTG: Maintain abilities at managing anxiety and have minimal panic attacks (Anxiety)  Not Progressing (2) LTG: Reduce frequency, intensity, and duration of depression symptoms so that daily functioning is improved (OP Depression) STG: Matthew Hutchinson will identify cognitive patterns and beliefs that support depression (OP Depression)   Progress Towards Goals: Progressing  Interventions: CBT, Motivational Interviewing, Solution Focused, and Supportive  Summary: Matthew Hutchinson is a 63 y.o. male with psych history of MDD and GAD, presenting for follow-up therapy session in efforts to improve management of depressive and anxious symptoms.   Patient actively engaged in session, presenting in overall depressed and anxious moods, with congruent affect throughout session. Pt openly engaged in introductory check-in, sharing of having missed last appt due to spouses presenting medical needs, having continued to get progressively worse, throughout the afternoon, presenting to ED later   Continued low BP being unable to be  controlled, making decision to end efforts of life preserving support on Friday morning.   Didn't sleep well last night as sugar monitor has been   Glean, adoptive-son, proving to support pt through process. ***        Patient responded well to interventions. Patient continues to meet criteria for MDD and GAD. Patient will continue to benefit from engagement in outpatient therapy due to being the least restrictive service to meet presenting needs.      03/13/2024    2:35 PM 12/13/2023    5:02 PM 11/30/2023    3:14 PM 11/15/2023    3:54 PM  GAD 7 : Generalized Anxiety Score  Nervous, Anxious, on Edge 2 2 3 3   Control/stop worrying 3 2 3 3   Worry too much - different things 3 1 3 3   Trouble relaxing 2 1 3 3   Restless 3 1 3 3   Easily annoyed or irritable 3 2 3 3   Afraid - awful might happen 3 1 3 3   Total GAD 7 Score 19 10 21 21   Anxiety Difficulty Extremely difficult Very difficult Very difficult Extremely difficult      03/13/2024    2:38 PM 12/13/2023    5:03 PM 11/30/2023    3:13 PM 11/15/2023    3:55 PM 09/12/2023    3:54 PM  Depression screen PHQ 2/9  Decreased Interest 3 1 1  0 0  Down, Depressed, Hopeless 2 1 3 3 3   PHQ - 2 Score 5 2 4 3 3   Altered sleeping 3 0 1 3 3   Tired, decreased energy 2 0 2 3 1   Change in appetite 3 0  3 1  Feeling bad or failure about yourself  3 1 3 3 1   Trouble concentrating 3 1 3 3 3   Moving slowly or fidgety/restless 3 0 2 3 3   Suicidal thoughts 0 0  0 3 0  PHQ-9 Score 22 4 15 24 15   Difficult doing work/chores Extremely dIfficult Somewhat difficult Very difficult Extremely dIfficult Very difficult   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[facility] from 11/24/2022 in [facility] at [facility] from 10/05/2022 in [facility] at [facility] RISK CATEGORY Moderate Risk Low Risk Low Risk    Suicidal/Homicidal: None, No  plan to harm self or others  Therapist Response: Clinician utilized CBT, MI, Psychoeducational, strengths based and supportive reflection interventions to support patient in efforts to address presenting sxs and challenges surrounding presenting stressors.  Clinician openly agreed to patient upon joining virtual visit, assessing presenting mood and affect, engaging in introductory check-in, utilizing open ended questions in eliciting recounts of events of the past weeks, onset of new stressors and ongoing stressors, individual efforts of navigating challenges, and factors proving to impact pt's moods. Utilized active listening techniques in providing support of pt's recounts of events, further validating expressed feelings, and processing pt's thoughts and perspectives in relation to new and continued stressors. Utilized CBT, and supportive reflection techniques to aid pt in processing challenges and exploring means of managing stress.  []  Cognitive Challenging  [x]  Cognitive Refocusing  [x]  Cognitive Reframing  []  Communication Skills []  Compliance Issues  []  DBT  [x]  Exploration of Coping Patterns  [x]  Exploration of Emotions []  Exploration of Relationship Patterns  []  Guided Imagery  []  Interactive Feedback  []  Interpersonal Resolutions []  Mindfulness Training  []  Preventative Services  [x]  Psycho-Education  []  Relaxation/Deep Breathing []  Review of Treatment Plan/Progress  []  Role-Play/Behavioral Rehearsal  []  Structured Problem Solving  [x]  Supportive Reflection [x]  Symptom Management  []  Other  Pt proves to maintain minimal progress towards goals.  Homework: None.  Plan: Return again in 2 weeks.  Diagnosis:  Encounter Diagnoses  Name Primary?   Major depressive disorder, recurrent episode, mild Yes   GAD (generalized anxiety disorder)    Collaboration of Care: Psychiatrist AEB provider documentation available in EHR.  Patient/Guardian was advised Release of Information must be  obtained prior to any record release in order to collaborate their care with an outside provider. Patient/Guardian was advised if they have not already done so to contact the registration department to sign all necessary forms in order for us  to release information regarding their care.   Consent: Patient/Guardian gives verbal consent for treatment and assignment of benefits for services provided during this visit. Patient/Guardian expressed understanding and agreed to proceed.   Virtual Visit via Video Note  I connected with Matthew Hutchinson on 04/23/24 at  3:00 PM EDT by a video enabled telemedicine application and verified that I am speaking with the correct person using two identifiers.  Location: Patient: Home; In bed. Provider: Home Office   I discussed the limitations of evaluation and management by telemedicine and the availability of in person appointments. The patient expressed understanding and agreed to proceed.   The patient was advised to call back or seek an in-person evaluation if the symptoms worsen or if the condition fails to improve as anticipated.  I provided 61 minutes of non-face-to-face time during this encounter.  Lynwood JONETTA Maris, MSW, LCSW 04/23/2024,  3:12 PM

## 2024-04-25 ENCOUNTER — Encounter: Payer: Self-pay | Admitting: Cardiology

## 2024-04-25 ENCOUNTER — Encounter: Payer: Self-pay | Admitting: Family Medicine

## 2024-04-27 ENCOUNTER — Telehealth: Payer: Self-pay

## 2024-04-27 NOTE — Telephone Encounter (Signed)
 Sent mychart message  Copied from CRM [id number]. Topic: Clinical - Request for Lab/Test Order >> Apr 27, 2024  2:07 PM Matthew Hutchinson wrote: Reason for CRM: Patient is schedule to come in for an OV on 04/2424 for his low blood sugars.He would like to know if he needs to be fasting for this appointment,if he will get labwork done? He said that a response could be placed on mychart.

## 2024-04-27 NOTE — Telephone Encounter (Signed)
 Called patient to schedule appt for low blood sugars. Left vm to return call

## 2024-04-28 ENCOUNTER — Other Ambulatory Visit (HOSPITAL_COMMUNITY): Payer: Self-pay | Admitting: Psychiatry

## 2024-04-28 DIAGNOSIS — F330 Major depressive disorder, recurrent, mild: Secondary | ICD-10-CM

## 2024-05-01 ENCOUNTER — Ambulatory Visit (HOSPITAL_COMMUNITY): Admitting: Licensed Clinical Social Worker

## 2024-05-01 DIAGNOSIS — F411 Generalized anxiety disorder: Secondary | ICD-10-CM

## 2024-05-01 DIAGNOSIS — F330 Major depressive disorder, recurrent, mild: Secondary | ICD-10-CM | POA: Diagnosis not present

## 2024-05-01 NOTE — Progress Notes (Unsigned)
 THERAPIST PROGRESS NOTE   Session Date: 05/01/2024  Session Time: 1408 - 1512  Participation Level: Active  MSE/Presentation: Behavior: Appropriate and Sharing Speech: Normal Thought Process: Coherent and Relevant Cognition: Alert and Appropriate Mood: Depressed and tearful Affect: Congruent Insight: Good Appearance: Disheveled  Type of Therapy: Individual Therapy  Treatment Goals addressed:   Progressing (6) *** LTG: Increase coping skills to manage depression and improve ability to perform daily activities (OP Depression) STG: Reduce overall depression score by a minimum of 25% on the Patient Health Questionnaire (PHQ-9) (OP Depression) STG: [place] will reduce frequency of avoidant behaviors by 50% as evidenced by self-report in therapy sessions (Anxiety) LTG: Continue to keep depression manageable when faced with the inevitable horribleness (OP Depression) LTG: Maintain abilities at managing anxiety and have minimal panic attacks (Anxiety)  Not Progressing (2) LTG: Reduce frequency, intensity, and duration of depression symptoms so that daily functioning is improved (OP Depression) STG: [place] will identify cognitive patterns and beliefs that support depression (OP Depression)   Progress Towards Goals: Progressing  Interventions: CBT, Motivational Interviewing, Solution Focused, and Supportive  Summary: Matthew Hutchinson is a 63 y.o. male with psych history of MDD and GAD, presenting for follow-up therapy session in efforts to improve management of depressive and anxious symptoms.   Patient actively engaged in session, presenting in depressed and anxious moods, with congruent affect throughout session. Pt openly engaged in introductory check-in, sharing of having had a stressful week with navigating funeral planning needs for late-spouse, having difficulties sleeping last night, experiencing difficult dreams, and having significant challenges surrounding approaching  various presenting stressors.   Has been taking trazadone more for increased sleep, still taking hydroxyzine  nightly,        03/13/2024    2:35 PM 12/13/2023    5:02 PM 11/30/2023    3:14 PM 11/15/2023    3:54 PM  GAD 7 : Generalized Anxiety Score  Nervous, Anxious, on Edge 2 2 3 3   Control/stop worrying 3 2 3 3   Worry too much - different things 3 1 3 3   Trouble relaxing 2 1 3 3   Restless 3 1 3 3   Easily annoyed or irritable 3 2 3 3   Afraid - awful might happen 3 1 3 3   Total GAD 7 Score 19 10 21 21   Anxiety Difficulty Extremely difficult Very difficult Very difficult Extremely difficult      03/13/2024    2:38 PM 12/13/2023    5:03 PM 11/30/2023    3:13 PM 11/15/2023    3:55 PM 09/12/2023    3:54 PM  Depression screen PHQ 2/9  Decreased Interest 3 1 1  0 0  Down, Depressed, Hopeless 2 1 3 3 3   PHQ - 2 Score 5 2 4 3 3   Altered sleeping 3 0 1 3 3   Tired, decreased energy 2 0 2 3 1   Change in appetite 3 0  3 1  Feeling bad or failure about yourself  3 1 3 3 1   Trouble concentrating 3 1 3 3 3   Moving slowly or fidgety/restless 3 0 2 3 3   Suicidal thoughts 0 0 0 3 0  PHQ-9 Score 22 4 15 24 15   Difficult doing work/chores Extremely dIfficult Somewhat difficult Very difficult Extremely dIfficult Very difficult   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[place] [facility] from 11/24/2022 in [facility] at [facility] from 10/05/2022 in [facility] at [facility] RISK  CATEGORY Moderate Risk Low Risk Low Risk    Suicidal/Homicidal: None, No plan to harm self or others  Therapist Response:  Clinician openly greeted patient upon joining virtual visit, assessing presenting mood and affect, engaging in introductory check-in, acknowledging pt's presentation, specifically appearance and affect, prompting pt's engagement in detailing contributing factors. *** Utilized active  listening in providing support of pt's recounts of events, further validating shared thoughts and expressed feelings, providing support and empathy for pt in reflecting on recent challenges. Utilized CBT, psycho-ed, and supportive reflections to aid pt in processing challenges and exploring means of managing stress.  []  Cognitive Challenging  [x]  Cognitive Refocusing  [x]  Cognitive Reframing  []  Communication Skills []  Compliance Issues  []  DBT  [x]  Exploration of Coping Patterns  [x]  Exploration of Emotions [x]  Exploration of Relationship Patterns  []  Guided Imagery  []  Interactive Feedback  []  Interpersonal Resolutions []  Mindfulness Training  []  Preventative Services  [x]  Psycho-Education  []  Relaxation/Deep Breathing []  Review of Treatment Plan/Progress  []  Role-Play/Behavioral Rehearsal  []  Structured Problem Solving  [x]  Supportive Reflection [x]  Symptom Management  []  Other  Patient responded well to interventions. Patient continues to meet criteria for MDD and GAD. Patient will continue to benefit from engagement in outpatient therapy due to being the least restrictive service to meet presenting needs. Pt proves to maintain minimal progress towards goals.  Homework: None.  Plan: Return again in 2 weeks.  Diagnosis:  No diagnosis found.  Collaboration of Care: Psychiatrist AEB provider documentation available in EHR.  Patient/Guardian was advised Release of Information must be obtained prior to any record release in order to collaborate their care with an outside provider. Patient/Guardian was advised if they have not already done so to contact the registration department to sign all necessary forms in order for us  to release information regarding their care.   Consent: Patient/Guardian gives verbal consent for treatment and assignment of benefits for services provided during this visit. Patient/Guardian expressed understanding and agreed to proceed.   Virtual Visit via Video  Note  I connected with Matthew Hutchinson on 05/01/24 at  2:00 PM EDT by a video enabled telemedicine application and verified that I am speaking with the correct person using two identifiers.  Location: Patient: Home Provider: Home Office   I discussed the limitations of evaluation and management by telemedicine and the availability of in person appointments. The patient expressed understanding and agreed to proceed.   The patient was advised to call back or seek an in-person evaluation if the symptoms worsen or if the condition fails to improve as anticipated.  I provided 63 minutes of non-face-to-face time during this encounter.  Lynwood JONETTA Maris, MSW, LCSW 05/01/2024,  2:06 PM

## 2024-05-04 ENCOUNTER — Ambulatory Visit (INDEPENDENT_AMBULATORY_CARE_PROVIDER_SITE_OTHER): Admitting: Family Medicine

## 2024-05-04 ENCOUNTER — Encounter: Payer: Self-pay | Admitting: Family Medicine

## 2024-05-04 VITALS — BP 114/58 | HR 82 | Temp 97.8°F | Resp 19 | Ht 74.5 in | Wt 301.4 lb

## 2024-05-04 DIAGNOSIS — Z7985 Long-term (current) use of injectable non-insulin antidiabetic drugs: Secondary | ICD-10-CM | POA: Diagnosis not present

## 2024-05-04 DIAGNOSIS — E1165 Type 2 diabetes mellitus with hyperglycemia: Secondary | ICD-10-CM

## 2024-05-04 DIAGNOSIS — Z7984 Long term (current) use of oral hypoglycemic drugs: Secondary | ICD-10-CM | POA: Diagnosis not present

## 2024-05-04 DIAGNOSIS — E162 Hypoglycemia, unspecified: Secondary | ICD-10-CM | POA: Diagnosis not present

## 2024-05-04 NOTE — Patient Instructions (Signed)
 I am very sorry to hear about your loss.  I do think that the decreased eating and stress of that has affected your blood sugar control.  Glad to hear the numbers are improving this week.  I am okay if they run a little bit high for now, no med changes at this time but lets follow-up in November as planned and can decide based on A1c at that time if any changes are needed.  Mood glucometer will give us  some better real-time info to make changes at that time as well.  If you do have any symptomatic low blood sugars, okay to use the Gvoke, but let me know if that occurs as we may need to adjust medication sooner.  Hang in there!

## 2024-05-04 NOTE — Progress Notes (Signed)
 Subjective:  Patient ID: Matthew Hutchinson, male    DOB: Apr 17, 1961  Age: 63 y.o. MRN: [id number]  CC:  Chief Complaint  Patient presents with   Blood Sugar Problem    Low BG's at home. Cannot keep BG's up. Didn't take metformin  for 2 days. Blood sugar is 110 right now. Highest reading he has gotten to 310. Lowest reading is 50s    HPI Matthew Hutchinson presents for   Diabetes: Complicated by both hyperglycemia and hypoglycemia previously.  Last discussed in August and at that time was on Mounjaro  7.5 mg weekly, metformin  500 mg twice daily.  We have adjusted his insulin  previously (Semglee ) with prior hyperglycemia and has been prescribed a G. Volk pen if needed for severe symptomatic hypoglycemia.  Ultimately we did discontinue his insulin  in May due to intermittent lows.  When discussed in August he did not have any significant lows with only 1 reading in 60-70 range in the prior 90 days.  Here today with recent low readings.  Sadly his spouse Matthew Hutchinson passed away 10/30/2025recent illness, low blood pressure and ultimately in cardiac ICU and was dependent on meds for blood pressure, sepsis? Did not appear to have reasonable chance of recovery, and taken off pressors.  Understandably this has been a difficult time.  He has been followed by psychiatry, Dr. Curry and a social worker Matthew Hutchinson. Last counseling episode 3 days ago with Matthew Hutchinson. Meeting once per week.   See MyChart message on October 15.  Had noted multiple low alarms overnight, low blood sugars after passing of spouse, notes b/t 2am and 8am.  Dinner at around 8pm. No insulin . treated with glucose tablets when low - as low as 40's. Used Gvoke pen 3 times - last week last used. Has refill and Stopped metformin  , and just one dose once day. Blood sugars increased to 250's and up to 300 range. Restarted metformin  4 days ago. Blood sugars are improved past few days. 102 now. Green range past 24 hours.  Past 7 days: 13%  above 250, 25% 180-250, 55% 70-180, 7% below 70, 0% below 54.  Last 3 months 89% between 70 and 180, 5% between 180-250, 1% above.  5% under 70, 0 under 54. Appetite was down and did not eat for 3 days after spouses' passing.  Has been eating a little better this past week. Some stress eating now.     Lab Results  Component Value Date   HGBA1C 6.1 03/01/2024   HGBA1C 5.8 12/01/2023   HGBA1C 6.1 07/21/2023   Lab Results  Component Value Date   MICROALBUR 1.8 12/01/2023   LDLCALC 56 03/01/2024   CREATININE 0.93 03/01/2024    History Patient Active Problem List   Diagnosis Date Noted   Chest wall pain 03/27/2024   Lower extremity edema 03/27/2024   Dysphagia 11/29/2023   Encounter for screening for other metabolic disorders 11/29/2023   Screening for malignant neoplasm of colon 11/29/2023   Epigastric pain 11/29/2023   Major depressive disorder, recurrent episode, mild 06/08/2023   GAD (generalized anxiety disorder) 06/08/2023   Pain due to onychomycosis of toenails of both feet 06/01/2023   Hammer toes of both feet 03/30/2023   Pain in joint of left shoulder 12/20/2022   Thoracic back pain 12/20/2022   Chronic pain of left lower extremity 09/30/2022   Type 2 diabetes mellitus with hyperglycemia, with long-term current use of insulin  (HCC) 09/30/2022   Hiccups 04/01/2022   Newly diagnosed  diabetes (HCC) 07/27/2018   Morbid obesity (HCC) 02/02/2017   Chronic pain due to trauma 06/25/2013   Depression, recurrent (HCC) 11/09/2012   Coronary artery disease, non-occlusive 02/21/2012   Hyperlipidemia associated with type 2 diabetes mellitus (HCC) 02/21/2012   Angina pectoris 09/27/2011   Obstructive sleep apnea 05/20/2010   Essential hypertension 04/17/2010   COPD mixed type (HCC) 04/17/2010   WEIGHT GAIN, ABNORMAL 04/17/2010   Past Medical History:  Diagnosis Date   Allergy    Anxiety    Arthritis    COPD (chronic obstructive pulmonary disease) (HCC)    Depression     Diabetes mellitus without complication (HCC) 2020   Emphysema of lung (HCC)    Hyperlipidemia    Morbid obesity (HCC)    OSA (obstructive sleep apnea)    uses CPAP nightly   PONV (postoperative nausea and vomiting)    Unspecified essential hypertension    Past Surgical History:  Procedure Laterality Date   CARDIAC CATHETERIZATION  2000   ESOPHAGOGASTRODUODENOSCOPY (EGD) WITH PROPOFOL  N/A 10/16/2021   Procedure: ESOPHAGOGASTRODUODENOSCOPY (EGD) WITH PROPOFOL ;  Surgeon: Rollin Dover, MD;  Location: [facility];  Service: Endoscopy;  Laterality: N/A;   INSERTION OF MESH N/A 09/18/2015   Procedure: INSERTION OF MESH;  Surgeon: Vicenta Poli, MD;  Location: [facility];  Service: General;  Laterality: N/A;   LEG SURGERY Left    SAVORY DILATION N/A 10/16/2021   Procedure: SAVORY DILATION;  Surgeon: Rollin Dover, MD;  Location: [facility];  Service: Endoscopy;  Laterality: N/A;   SHOULDER ARTHROSCOPY Left    TONSILLECTOMY     UMBILICAL HERNIA REPAIR N/A 09/18/2015   Procedure: HERNIA REPAIR UMBILICAL ADULT WITH MESH ;  Surgeon: Vicenta Poli, MD;  Location: [facility];  Service: General;  Laterality: N/A;   Allergies  Allergen Reactions   Penicillins Anaphylaxis   Valsartan Itching and Swelling   Prior to Admission medications   Medication Sig Start Date End Date Taking? Authorizing Provider  amLODipine  (NORVASC ) 5 MG tablet Take 1 tablet (5 mg total) by mouth daily. 03/01/24  Yes Levora Reyes SAUNDERS, MD  ARNUITY ELLIPTA  100 MCG/ACT AEPB TAKE 1 PUFF BY MOUTH EVERY DAY 04/13/24  Yes Levora Reyes SAUNDERS, MD  aspirin  81 MG tablet Take 1 tablet (81 mg total) by mouth daily. 11/13/12  Yes Lord, Sharlot GRADE, NP  Carboxymethylcellul-Glycerin (LUBRICATING EYE DROPS OP) Place 1 drop into both eyes daily as needed (dry eyes).   Yes [provider]  Continuous Glucose Sensor (FREESTYLE LIBRE 3 SENSOR) MISC Apply as instructed, change every 10 days. 08/30/23  Yes  Levora Reyes SAUNDERS, MD  Continuous Glucose Sensor (FREESTYLE LIBRE 3 SENSOR) MISC APPLY AS INSTRUCTED, CHANGE EVERY 10 DAYS. 08/30/23  Yes Levora Reyes SAUNDERS, MD  Continuous Glucose Sensor (FREESTYLE LIBRE 3 SENSOR) MISC APPLY AS INSTRUCTED, CHANGE EVERY 14 DAYS 08/30/23  Yes Levora Reyes SAUNDERS, MD  gabapentin  (NEURONTIN ) 300 MG capsule TAKE 2 CAPSULES BY MOUTH 2 TIMES DAILY. 03/01/24  Yes Levora Reyes SAUNDERS, MD  hydrOXYzine  (VISTARIL ) 50 MG capsule TAKE 2 CAPSULES BY MOUTH AT BEDTIME 02/01/24  Yes Arfeen, Leni DASEN, MD  lamoTRIgine  (LAMICTAL ) 200 MG tablet Take 1 tablet (200 mg total) by mouth daily. 02/01/24  Yes Arfeen, Leni DASEN, MD  losartan -hydrochlorothiazide  (HYZAAR) 100-25 MG tablet Take 1 tablet by mouth daily. 03/01/24  Yes Levora Reyes SAUNDERS, MD  metFORMIN  (GLUCOPHAGE ) 500 MG tablet Take 1 tablet (500 mg total) by mouth 2 (two) times daily with a meal. 03/01/24  Yes Levora Reyes SAUNDERS, MD  nitroGLYCERIN  (NITROSTAT ) 0.4 MG SL tablet Place 1 tablet (0.4 mg total) under the tongue every 5 (five) minutes as needed for chest pain. If chest pain not resolved, after 3 doses 5 minutes apart--call 911 11/13/12  Yes Lord, Sharlot GRADE, NP  sertraline  (ZOLOFT ) 100 MG tablet TAKE 1 TABLET BY MOUTH DAILY 02/01/24  Yes Arfeen, Leni DASEN, MD  simvastatin  (ZOCOR ) 20 MG tablet TAKE 1 TABLET BY MOUTH EVERYDAY AT BEDTIME 03/01/24  Yes Levora Reyes SAUNDERS, MD  tirzepatide  (MOUNJARO ) 7.5 MG/0.5ML Pen Inject 7.5 mg into the skin once a week. 03/01/24  Yes Levora Reyes SAUNDERS, MD  traMADol  (ULTRAM ) 50 MG tablet Take 50 mg by mouth daily as needed for severe pain.   Yes [provider]  traZODone  (DESYREL ) 100 MG tablet Take 1 tablet (100 mg total) by mouth at bedtime as needed for sleep. 02/01/24  Yes Arfeen, Leni DASEN, MD  b complex vitamins tablet Take 1 tablet by mouth daily.    [provider]  calcium  gluconate 500 MG tablet Take 500 mg by mouth daily.    [provider]  Cholecalciferol  (VITAMIN D ) 2000 UNITS tablet  Take 1 tablet (2,000 Units total) by mouth daily. 11/13/12   Matthew Sharlot GRADE, NP  GARLIC PO Take 100 mg by mouth daily.    [provider]  Lutein  40 MG CAPS Take 40 mg by mouth at bedtime.    [provider]  magnesium  gluconate (MAGONATE) 500 MG tablet Take 500 mg by mouth daily.    [provider]  Multiple Vitamin (MULTIVITAMIN) capsule Take 1 capsule by mouth daily. 11/13/12   Matthew Sharlot GRADE, NP  Potassium 99 MG TABS Take 99 mg by mouth at bedtime.    [provider]  Shark Cartilage 740 MG CAPS Take 1 capsule by mouth 3 (three) times daily.    [provider]  TECHLITE PLUS PEN NEEDLES 32G X 4 MM MISC USE ONCE DAILY WITH LANTUS . 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  Tobacco Use   Smoking status: Former    Current packs/day: 0.00    Average packs/day: 2.0 packs/day for 26.0 years (52.0 ttl pk-yrs)    Types: Cigarettes    Start date: 08/12/1977    Quit date: 08/13/2003    Years since quitting: 20.7   Smokeless tobacco: Never   Tobacco comments:    2ppd x 26 years  Vaping Use   Vaping status: Never Used  Substance and Sexual Activity   Alcohol  use: No    Alcohol /week: 0.0 standard drinks of alcohol     Comment: social   Drug use: No   Sexual activity: Yes    Partners: Male    Birth control/protection: None  Other Topics Concern   Not on file  Social History Narrative   Married. Education: college. Exercise: No.   Social Drivers of Health   Financial Resource Strain: Medium Risk (04/30/2024)   Overall Financial Resource Strain (CARDIA)    Difficulty of Paying Living Expenses: Somewhat hard  Food Insecurity: No Food Insecurity (04/30/2024)   Hunger Vital  Sign    Worried About Running Out  of Food in the Last Year: Never true    Ran Out of Food in the Last Year: Never true  Transportation Needs: No Transportation Needs (04/30/2024)   PRAPARE - Administrator, Civil Service (Medical): No    Lack of Transportation (Non-Medical): No  Physical Activity: Insufficiently Active (04/30/2024)   Exercise Vital Sign    Days of Exercise per Week: 1 day    Minutes of Exercise per Session: 20 min  Stress: Stress Concern Present (04/30/2024)   [employer] of Occupational Health - Occupational Stress Questionnaire    Feeling of Stress: Very much  Social Connections: Socially Isolated (04/30/2024)   Social Connection and Isolation Panel    Frequency of Communication with Friends and Family: More than three times a week    Frequency of Social Gatherings with Friends and Family: Once a week    Attends Religious Services: Never    Database Administrator or Organizations: No    Attends Engineer, Structural: Not on file    Marital Status: Widowed  Intimate Partner Violence: Not on file    Review of Systems Per HPI.   Objective:   Vitals:   05/04/24 1000  BP: (!) 114/58  Pulse: 82  Resp: 19  Temp: 97.8 F (36.6 C)  TempSrc: Temporal  SpO2: 94%  Weight: (!) 301 lb 6.4 oz (136.7 kg)  Height: 6' 2.5 (1.892 m)     Physical Exam Vitals reviewed.  Constitutional:      General: He is not in acute distress.    Appearance: He is well-developed. He is not ill-appearing, toxic-appearing or diaphoretic.  HENT:     Head: Normocephalic and atraumatic.  Neck:     Vascular: No carotid bruit or JVD.  Cardiovascular:     Rate and Rhythm: Normal rate and regular rhythm.     Heart sounds: Normal heart sounds. No murmur heard. Pulmonary:     Effort: Pulmonary effort is normal.     Breath sounds: Normal breath sounds. No rales.  Musculoskeletal:     Right lower leg: No edema.     Left lower leg: No edema.  Skin:    General:  Skin is warm and dry.  Neurological:     Mental Status: He is alert and oriented to person, place, and time.  Psychiatric:        Mood and Affect: Mood normal.        Behavior: Behavior normal.    I personally spent a total of 34 minutes in the care of the patient today including preparing to see the patient, performing a medically appropriate exam/evaluation, counseling and educating, and discussing plan for hypoglycemia if recurs.  Additional time with discussion of recent loss and coping systems in place.   Assessment & Plan:  IHSAN NOMURA is a 63 y.o. male . Type 2 diabetes mellitus with hyperglycemia, without long-term current use of insulin  (HCC)  Hypoglycemia  Condolences given on loss of spouse.  He does have a good support system in place with psychiatry, and therapist as above.  I do think that the decreased intake impacted the hypoglycemia initially.  That is improving recently.  Discussed that some hyperglycemia would be better tolerated temporarily but then further hypoglycemia.  No med changes at this time but plan on follow-up in November and check A1c at that time to decide if changes needed.  Will continue to follow patterns on glucometer with RTC precautions.  Has GVoke pen if needed  for severe symptomatic hypoglycemia but advised to let me know if he does have to use that as we would make some other changes prior to next visit.  No orders of the defined types were placed in this encounter.  Patient Instructions  I am very sorry to hear about your loss.  I do think that the decreased eating and stress of that has affected your blood sugar control.  Glad to hear the numbers are improving this week.  I am okay if they run a little bit high for now, no med changes at this time but lets follow-up in November as planned and can decide based on A1c at that time if any changes are needed.  Mood glucometer will give us  some better real-time info to make changes at that time  as well.  If you do have any symptomatic low blood sugars, okay to use the Gvoke, but let me know if that occurs as we may need to adjust medication sooner.  Hang in there!    Signed,   Reyes Pines, MD [facility], [facility] 05/04/24 10:52 AM

## 2024-05-07 ENCOUNTER — Ambulatory Visit (HOSPITAL_COMMUNITY): Admitting: Licensed Clinical Social Worker

## 2024-05-07 ENCOUNTER — Ambulatory Visit (INDEPENDENT_AMBULATORY_CARE_PROVIDER_SITE_OTHER): Admitting: Licensed Clinical Social Worker

## 2024-05-07 DIAGNOSIS — F411 Generalized anxiety disorder: Secondary | ICD-10-CM | POA: Diagnosis not present

## 2024-05-07 DIAGNOSIS — F330 Major depressive disorder, recurrent, mild: Secondary | ICD-10-CM

## 2024-05-07 NOTE — Progress Notes (Unsigned)
 THERAPIST PROGRESS NOTE   Session Date: 05/07/2024  Session Time: 1405 - 1521  Participation Level: Active  MSE/Presentation: Behavior: Appropriate and Sharing Speech: Normal Thought Process: Coherent and Relevant Cognition: Alert and Appropriate Mood: Depressed and Euthymic Affect: Congruent Insight: Good Appearance: Casual  Type of Therapy: Individual Therapy  Treatment Goals addressed:   Progressing (6)  LTG: Increase coping skills to manage depression and improve ability to perform daily activities (OP Depression) STG: Reduce overall depression score by a minimum of 25% on the Patient Health Questionnaire (PHQ-9) (OP Depression) STG: Matthew Hutchinson will reduce frequency of avoidant behaviors by 50% as evidenced by self-report in therapy sessions (Anxiety) LTG: Continue to keep depression manageable when faced with the inevitable horribleness (OP Depression) LTG: Maintain abilities at managing anxiety and have minimal panic attacks (Anxiety)  Not Progressing (2) LTG: Reduce frequency, intensity, and duration of depression symptoms so that daily functioning is improved (OP Depression) STG: Matthew Hutchinson will identify cognitive patterns and beliefs that support depression (OP Depression)  Progress Towards Goals: Progressing  Interventions: CBT, Motivational Interviewing, Solution Focused, and Supportive  Summary: Matthew Hutchinson is a 63 y.o. male with psych history of MDD and GAD, presenting for follow-up therapy session in efforts to improve management of depressive and anxious symptoms.   Patient actively engaged in session, presenting in depressed and anxious moods, with congruent affect throughout session. Pt openly engaged in introductory check-in, sharing of being hoarse currently, processing contributing factors potentially related to lack of heat in the home as well as having been talking a lot with others over the past week surrounding plans for late husband. Pt further  shared of having been setting small goals, working on teeny tiny tasks such as washing dishes, laundry, etc. In order to address household tasks. Pt further engaged in exploring ways in which he continues to navigate experienced sxs, expressing awareness of anticipated variances in moods in relation to loss of spouse and experienced grief. Pt engaged in review of small actionable steps he proves to have taken in working towards downsizing, having contacted some to explore potential sale of late spouses model trains and cars, as well as rearranging furniture for the home environment to appear less like that of which it has over the past year while spouse was experiencing increased medical complications.     03/13/2024    2:35 PM 12/13/2023    5:02 PM 11/30/2023    3:14 PM 11/15/2023    3:54 PM  GAD 7 : Generalized Anxiety Score  Nervous, Anxious, on Edge 2 2 3 3   Control/stop worrying 3 2 3 3   Worry too much - different things 3 1 3 3   Trouble relaxing 2 1 3 3   Restless 3 1 3 3   Easily annoyed or irritable 3 2 3 3   Afraid - awful might happen 3 1 3 3   Total GAD 7 Score 19 10 21 21   Anxiety Difficulty Extremely difficult Very difficult Very difficult Extremely difficult      03/13/2024    2:38 PM 12/13/2023    5:03 PM 11/30/2023    3:13 PM 11/15/2023    3:55 PM 09/12/2023    3:54 PM  Depression screen PHQ 2/9  Decreased Interest 3 1 1  0 0  Down, Depressed, Hopeless 2 1 3 3 3   PHQ - 2 Score 5 2 4 3 3   Altered sleeping 3 0 1 3 3   Tired, decreased energy 2 0 2 3 1   Change in appetite 3 0  3 1  Feeling bad or failure about yourself  3 1 3 3 1   Trouble concentrating 3 1 3 3 3   Moving slowly or fidgety/restless 3 0 2 3 3   Suicidal thoughts 0 0 0 3 0  PHQ-9 Score 22 4 15 24 15   Difficult doing work/chores Extremely dIfficult Somewhat difficult Very difficult Extremely dIfficult Very difficult   Flowsheet Row Counselor from 11/15/2023 in [facility]  from 11[facility]24/2022 in [facility] from 10/05/2022 in [facility] at [facility] RISK CATEGORY Moderate Risk Low Risk Low Risk    Suicidal/Homicidal: None, No plan to harm self or others  Therapist Response:  Clinician openly greeted patient upon joining virtual visit, assessing presenting mood and affect, engaging in introductory check-in, exploring daily events and presenting moods.  Utilized open ended questions in eliciting pt's recounts of events of the past week, exploring newly identified stressors, ongoing difficulties, and improvements in mgmnt of sxs. Utilized active listening in providing support of pt's recounts of events, further validating shared thoughts and expressed feelings, providing support and empathy for pt in reflecting on recent challenges. Utilized CBT, psycho-ed, and supportive reflections to aid pt in processing challenges and exploring means of managing stress.  [x]  Cognitive Challenging  [x]  Cognitive Refocusing  [x]  Cognitive Reframing  []  Communication Skills []  Compliance Issues  []  DBT  [x]  Exploration of Coping Patterns  [x]  Exploration of Emotions [x]  Exploration of Relationship Patterns  []  Guided Imagery  []  Interactive Feedback  []  Interpersonal Resolutions []  Mindfulness Training  []  Preventative Services  [x]  Psycho-Education  []  Relaxation/Deep Breathing []  Review of Treatment Plan/Progress  []  Role-Play/Behavioral Rehearsal  [x]  Structured Problem Solving  [x]  Supportive Reflection [x]  Symptom Management  []  Other  Patient responded well to interventions. Patient continues to meet criteria for MDD and GAD. Patient will continue to benefit from engagement in outpatient therapy due to being the least restrictive service to meet presenting needs. Pt proves to maintain minimal progress towards goals.  Homework: None.  Plan: Return again in 1 weeks.  Diagnosis:  Encounter  Diagnoses  Name Primary?   Major depressive disorder, recurrent episode, mild Yes   GAD (generalized anxiety disorder)    Collaboration of Care: Psychiatrist AEB provider documentation available in EHR.  Patient/Guardian was advised Release of Information must be obtained prior to any record release in order to collaborate their care with an outside provider. Patient/Guardian was advised if they have not already done so to contact the registration department to sign all necessary forms in order for us  to release information regarding their care.   Consent: Patient/Guardian gives verbal consent for treatment and assignment of benefits for services provided during this visit. Patient/Guardian expressed understanding and agreed to proceed.   Virtual Visit via Video Note  I connected with Matthew Hutchinson on 05/07/24 at  2:00 PM EDT by a video enabled telemedicine application and verified that I am speaking with the correct person using two identifiers.  Location: Patient: Home Provider: Home Office   I discussed the limitations of evaluation and management by telemedicine and the availability of in person appointments. The patient expressed understanding and agreed to proceed.   The patient was advised to call back or seek an in-person evaluation if the symptoms worsen or if the condition fails to improve as anticipated.  I provided 76 minutes of non-face-to-face time during this encounter.  Evamae Rowen D Hilliary Jock,  MSW, LCSW 05/07/2024,  2:05 PM

## 2024-05-10 ENCOUNTER — Telehealth (HOSPITAL_COMMUNITY): Admitting: Psychiatry

## 2024-05-10 ENCOUNTER — Encounter (HOSPITAL_COMMUNITY): Payer: Self-pay | Admitting: Psychiatry

## 2024-05-10 VITALS — Wt 301.0 lb

## 2024-05-10 DIAGNOSIS — F411 Generalized anxiety disorder: Secondary | ICD-10-CM | POA: Diagnosis not present

## 2024-05-10 DIAGNOSIS — F330 Major depressive disorder, recurrent, mild: Secondary | ICD-10-CM | POA: Diagnosis not present

## 2024-05-10 DIAGNOSIS — F4321 Adjustment disorder with depressed mood: Secondary | ICD-10-CM | POA: Diagnosis not present

## 2024-05-10 DIAGNOSIS — F5102 Adjustment insomnia: Secondary | ICD-10-CM | POA: Diagnosis not present

## 2024-05-10 MED ORDER — SERTRALINE HCL 100 MG PO TABS: ORAL_TABLET | ORAL | 0 refills | Status: DC

## 2024-05-10 MED ORDER — TEMAZEPAM 7.5 MG PO CAPS: 7.5000 mg | ORAL_CAPSULE | Freq: Every evening | ORAL | 0 refills | Status: DC | PRN

## 2024-05-10 MED ORDER — HYDROXYZINE PAMOATE 50 MG PO CAPS: ORAL_CAPSULE | ORAL | 0 refills | Status: DC

## 2024-05-10 MED ORDER — LAMOTRIGINE 200 MG PO TABS: 200.0000 mg | ORAL_TABLET | Freq: Every day | ORAL | 0 refills | Status: DC

## 2024-05-10 NOTE — Progress Notes (Signed)
 [facility] MD Virtual Progress Note   Patient Location: Home Provider Location: Office  I connect with patient by video and verified that I am speaking with correct person by using two identifiers. I discussed the limitations of evaluation and management by telemedicine and the availability of in person appointments. I also discussed with the patient that there may be a patient responsible charge related to this service. The patient expressed understanding and agreed to proceed.  Matthew Hutchinson [id number] 63 y.o.  05/10/2024 2:59 PM  History of Present Illness:  Patient is evaluated by video session.  He reported had a hard time going through grief after his husband died 3 weeks ago in the hospital due to prolonged illness.  Patient told before the death he was begging to save him from dying.  Patient has a lot of regret not able to save him but realized that he could not do more than he did.  He is having a lot of crying spells, insomnia, tired, irritability.  He is also very frustrated on his family members because now they have been sued because of the previous issues about money.  He admitted struggling with daily activities.  He is easy to cry and emotional.  Patient told he was with his husband for 30 years and last year his husband is in and out from the hospital and he was all the time with him.  He is upset because his husband give up his hope very soon.  He denies any mania, aggression, violence but admitted a lot of irritability and frustration.  He has vivid dream.  He feels very weird because he is by himself every day.  His only supportive is his son who is not his biological son but he consider him because he knows him since age 49.  He is in touch with him every day and talk to him 3-4 times a week.  His other family is not as supportive.  He is seeing a therapist regularly.  Recently had a visit with primary care and had blood work.  He is sleeping with CPAP but  there are nights he struggle and having racing thoughts.  He decided to stop taking all the over-the-counter supplements and multivitamins because did not see any benefits.  He is not taking trazodone  as it make him very groggy drowsy and sometime having vivid dreams.  Denies any hallucination, paranoia.  He admitted being anxious and nervous.  His appetite is fair.  He admitted not keeping his diet as he supposed to and last hemoglobin A1c slightly increased.  He has no rash or any itching.  He denies drinking or using any illegal substances.   Past Psychiatric History: H/O inpatient at [facility] for overdose on Ambien .  H/O overdose on Ambien  2 other times but did not require inpatient treatment.  H/O of mood swing, impulsive behavior, speeding ticket, anger, road rage.  No history of psychosis or hallucination.   Past Medical History:  Diagnosis Date   Allergy    Anxiety    Arthritis    COPD (chronic obstructive pulmonary disease) (HCC)    Depression    Diabetes mellitus without complication (HCC) 2020   Emphysema of lung (HCC)    Hyperlipidemia    Morbid obesity (HCC)    OSA (obstructive sleep apnea)    uses CPAP nightly   PONV (postoperative nausea and vomiting)    Unspecified essential hypertension     Outpatient Encounter Medications as of 05/10/2024  Medication Sig   amLODipine  (NORVASC ) 5 MG tablet Take 1 tablet (5 mg total) by mouth daily.   ARNUITY ELLIPTA  100 MCG/ACT AEPB TAKE 1 PUFF BY MOUTH EVERY DAY   aspirin  81 MG tablet Take 1 tablet (81 mg total) by mouth daily.   b complex vitamins tablet Take 1 tablet by mouth daily.   calcium  gluconate 500 MG tablet Take 500 mg by mouth daily.   Carboxymethylcellul-Glycerin (LUBRICATING EYE DROPS OP) Place 1 drop into both eyes daily as needed (dry eyes).   Cholecalciferol  (VITAMIN D ) 2000 UNITS tablet Take 1 tablet (2,000 Units total) by mouth daily.   Continuous Glucose Sensor (FREESTYLE LIBRE 3 SENSOR) MISC Apply as instructed, change  every 10 days.   Continuous Glucose Sensor (FREESTYLE LIBRE 3 SENSOR) MISC APPLY AS INSTRUCTED, CHANGE EVERY 10 DAYS.   Continuous Glucose Sensor (FREESTYLE LIBRE 3 SENSOR) MISC APPLY AS INSTRUCTED, CHANGE EVERY 14 DAYS   gabapentin  (NEURONTIN ) 300 MG capsule TAKE 2 CAPSULES BY MOUTH 2 TIMES DAILY.   GARLIC PO Take 100 mg by mouth daily.   hydrOXYzine  (VISTARIL ) 50 MG capsule TAKE 2 CAPSULES BY MOUTH AT BEDTIME   lamoTRIgine  (LAMICTAL ) 200 MG tablet Take 1 tablet (200 mg total) by mouth daily.   losartan -hydrochlorothiazide  (HYZAAR) 100-25 MG tablet Take 1 tablet by mouth daily.   Lutein  40 MG CAPS Take 40 mg by mouth at bedtime.   magnesium  gluconate (MAGONATE) 500 MG tablet Take 500 mg by mouth daily.   metFORMIN  (GLUCOPHAGE ) 500 MG tablet Take 1 tablet (500 mg total) by mouth 2 (two) times daily with a meal.   Multiple Vitamin (MULTIVITAMIN) capsule Take 1 capsule by mouth daily.   nitroGLYCERIN  (NITROSTAT ) 0.4 MG SL tablet Place 1 tablet (0.4 mg total) under the tongue every 5 (five) minutes as needed for chest pain. If chest pain not resolved, after 3 doses 5 minutes apart--call 911   Potassium 99 MG TABS Take 99 mg by mouth at bedtime.   sertraline  (ZOLOFT ) 100 MG tablet TAKE 1 TABLET BY MOUTH DAILY   Shark Cartilage 740 MG CAPS Take 1 capsule by mouth 3 (three) times daily.   simvastatin  (ZOCOR ) 20 MG tablet TAKE 1 TABLET BY MOUTH EVERYDAY AT BEDTIME   TECHLITE PLUS PEN NEEDLES 32G X 4 MM MISC USE ONCE DAILY WITH LANTUS . DX E11.9, Z79.4   tirzepatide  (MOUNJARO ) 7.5 MG/0.5ML Pen Inject 7.5 mg into the skin once a week.   traMADol  (ULTRAM ) 50 MG tablet Take 50 mg by mouth daily as needed for severe pain.   traZODone  (DESYREL ) 100 MG tablet Take 1 tablet (100 mg total) by mouth at bedtime as needed for sleep.   No facility-administered encounter medications on file as of 05/10/2024.    Recent Results (from the past 2160 hours)  Comprehensive metabolic panel with GFR     Status: None    Collection Time: 03/01/24  3:25 PM  Result Value Ref Range   Sodium 140 135 - 145 mEq/L   Potassium 3.6 3.5 - 5.1 mEq/L   Chloride 102 96 - 112 mEq/L   CO2 28 19 - 32 mEq/L   Glucose, Bld 88 70 - 99 mg/dL   BUN 12 6 - 23 mg/dL   Creatinine, Ser 9.06 0.40 - 1.50 mg/dL   Total Bilirubin 1.1 0.2 - 1.2 mg/dL   Alkaline Phosphatase 65 39 - 117 U/L   AST 26 0 - 37 U/L   ALT 19 0 - 53 U/L   Total Protein  7.2 6.0 - 8.3 g/dL   Albumin 4.5 3.5 - 5.2 g/dL   GFR 12.32 >39.99 mL/min    Comment: Calculated using the CKD-EPI Creatinine Equation (2021)   Calcium  9.3 8.4 - 10.5 mg/dL  Lipid panel     Status: None   Collection Time: 03/01/24  3:25 PM  Result Value Ref Range   Cholesterol 126 0 - 200 mg/dL    Comment: ATP III Classification       Desirable:  < 200 mg/dL               Borderline High:  200 - 239 mg/dL          High:  > = 759 mg/dL   Triglycerides 07.9 0.0 - 149.0 mg/dL    Comment: Normal:  <849 mg/dLBorderline High:  150 - 199 mg/dL   HDL 48.49 >60.99 mg/dL   VLDL 81.5 0.0 - 59.9 mg/dL   LDL Cholesterol 56 0 - 99 mg/dL   Total CHOL/HDL Ratio 2     Comment:                Men          Women1/2 Average Risk     3.4          3.3Average Risk          5.0          4.42X Average Risk          9.6          7.13X Average Risk          15.0          11.0                       NonHDL 74.88     Comment: NOTE:  Non-HDL goal should be 30 mg/dL higher than patient's LDL goal (i.e. LDL goal of < 70 mg/dL, would have non-HDL goal of < 100 mg/dL)  Hemoglobin J8r     Status: None   Collection Time: 03/01/24  3:25 PM  Result Value Ref Range   Hgb A1c MFr Bld 6.1 4.6 - 6.5 %    Comment: Glycemic Control Guidelines for People with Diabetes:Non Diabetic:  <6%Goal of Therapy: <7%Additional Action Suggested:  >8%   GeneConnect Molecular Screen - Blood ([facility])     Status: None   Collection Time: 04/05/24  2:16 PM  Result Value Ref Range   Genetic Analysis Overall Interpretation  Negative    Genetic Disease Assessed      This is a screening test and does not detect all pathogenic or likely pathogenic variant(s) in the tested genes; diagnostic testing is recommended for individuals with a personal or family history of heart disease or hereditary cancer. [facility] Tier One  Population Screen is a screening test that analyzes 11 genes related to hereditary breast and ovarian cancer (HBOC) syndrome, Lynch syndrome, and familial hypercholesterolemia. This test only reports clinically significant pathogenic and likely  pathogenic variants but does not report variants of uncertain significance (VUS). In addition, analysis of the PMS2 gene excludes exons 11-15, which overlap with a known pseudogene (PMS2CL).    Genetic Analysis Report      No pathogenic or likely pathogenic variants were detected in the genes analyzed by this test.Genetic test results should be interpreted in the context of an individual's personal medical and family history. Alteration to medical management is NOT  recommended based  solely on this result. Clinical correlation is advised.Additional Considerations- This is a screening test; individuals may still carry pathogenic or likely pathogenic variant(s) in the tested genes that are not detected by this test.-  For individuals at risk for these or other related conditions based on factors including personal or family history, diagnostic testing is recommended.- The absence of pathogenic or likely pathogenic variant(s) in the analyzed genes, while reassuring,  does not eliminate the possibility of a hereditary condition; there are other variants and genes associated with heart disease and hereditary cancer that are not included in this test.    Genes Tested See Notes     Comment: APOB, BRCA1, BRCA2, EPCAM, LDLR, LDLRAP1, PCSK9, PMS2, MLH1, MSH2, MSH6   Disclaimer See Notes     Comment: This test was developed and validated by [facility] This test has not been cleared  or approved by the [place]  Food and Drug Administration (FDA). The [facility] laboratory is accredited by the College of American Pathologists (CAP) and certified under  the Clinical Laboratory Improvement Amendments (CLIA #: [id number]) to perform high-complexity clinical tests. This test is used for clinical purposes. It should not be regarded as investigational use only or for research use only.    Sequencing Location See Notes     Comment: Sequencing done at [facility] [address], [address], [place] [address] (CLIA# [id number])   Interpretation Methods and Limitations See Notes     Comment: Extracted DNA is enriched for targeted regions and then sequenced using the [facility] Exome+ (R) assay on an Illumina DNA sequencing system. Data is then aligned to a modified version of GRCh38 and all genes are analyzed using the MANE transcript and MANE  Plus Clinical transcript, when available. Small variant calling is completed using a customized version of [employer]'s DNAseq software, augmented by a proprietary small variant caller for difficult variants. Copy number variants (CNVs) are then called  using a proprietary bioinformatics pipeline based on depth analysis with a comparison to similarly sequenced samples. Analysis of the PMS2 gene is limited to exons 1-10. The interpretation and reporting of variants in APOB, PCSK9, and LDLR is specific to  familial hypercholesterolemia; variants associated with hypobetalipoproteinemia are not included. Interpretation is based upon guidelines published by the [employer] and Genomics [employer]), the Association for Mol ecular Pathology  (AMP) or their modification by [employer] when available and/or review of previous clinical assertions available in the [employer]. Interpretation is limited to the transcripts indicated on the report and +/- 10 bp into  intronic regions, except as noted below.  [facility] variant classifications include pathogenic, likely pathogenic, variant of uncertain significance (VUS), likely benign, and benign. Only variants classified as pathogenic and likely pathogenic are included in  the report. All reported variants are confirmed through secondary manual inspection of DNA sequence data or orthogonal testing. Risk estimations and management guidelines included in this report are based on analysis of primary literature and  recommendations of applicable professional societies, and should be regarded as approximations.Based on validation studies,this assay delivers > 99% sensitivity and specificity for single nucleotide variants and insertions  and deletions (indels) up to 20  bp. Larger indels and complex variants are also reported but sensitivity may be reduced. Based on validation studies, this assay delivers > 99% sensitivity to multi-exon CNVs and > 90% sensitivity to single-exon CNVs. This test may not detect variants  in challenging regions (such as short tandem repeats,  homopolymer runs, and segment duplications), sub-exonic CNVs, chromosomal aneuploidy, or variants in the presence of mosaicism. Phasing will be attempted and reported, when possible. Structural  rearrangements such as inversions, translocations, and gene conversions are not tested in this assay unless explicitly indicated. Additionally, deep intronic, promoter, and enhancer regions may not be covered. It is important to note that this is a  screening test and cannot detect all disease-causing variants. A negative result does not guarantee the absence of a rare, undetectable variant in the genes analyzed; consider using a diagnostic test if there is  significant personal and/or family history  of one of the conditions analyzed by this test. Any potential incidental findings outside of these genes and conditions will not be identified, nor reported. The results of a genetic test may be influenced by various  factors, including bone marrow  transplantation, blood transfusions, or in rare cases, hematolymphoid neoplasms.Gene Specific Notes:APOB: analysis is limited to c.10580G>A and c.10579C>T; BRCA1: sequencing analysis extends to CDS +/-20 bp; BRCA2: sequencing analysis extends to CDS  +/-20 bp. EPCAM: analysis is limited to CNVof exons 8-9; LDLR: analysis includes CNV ofthe promoter; MLH1: analysis includes CNV of the promoter; PMS2: analysis is limited to exons 1-10.Donnice JINNY Kemp, PhD, [email or web address]      Psychiatric Specialty Exam: Physical Exam  Review of Systems  Weight (!) 301 lb (136.5 kg).There is no height or weight on file to calculate BMI.  General Appearance: Casual  Eye Contact:  Fair  Speech:  Slow  Volume:  Decreased  Mood:  Depressed, Dysphoric, and emotional  Affect:  Constricted and Depressed  Thought Process:  Descriptions of Associations: Intact  Orientation:  Full (Time, Place, and Person)  Thought Content:  Rumination  Suicidal Thoughts:  No  Homicidal Thoughts:  No  Memory:  Immediate;   Fair Recent;   Fair Remote;   Good  Judgement:  Intact  Insight:  Present  Psychomotor Activity:  Decreased  Concentration:  Concentration: Fair and Attention Span: Good  Recall:  Good  Fund of Knowledge:  Good  Language:  Good  Akathisia:  No  Handed:  Right  AIMS (if indicated):     Assets:  Communication Skills Desire for Improvement Housing  ADL's:  Intact  Cognition:  WNL  Sleep:  erratic       03/13/2024    2:38 PM 12/13/2023    5:03 PM 11/30/2023    3:13 PM 11/15/2023    3:55 PM 09/12/2023    3:54 PM  Depression screen PHQ 2/9  Decreased Interest 3 1 1  0 0  Down, Depressed, Hopeless 2 1 3 3 3   PHQ - 2 Score 5 2 4 3 3   Altered sleeping 3 0 1 3 3   Tired, decreased energy 2 0 2 3 1   Change in appetite 3 0  3 1  Feeling bad or failure about yourself  3 1 3 3 1   Trouble concentrating 3 1 3 3 3   Moving slowly or fidgety/restless 3 0 2 3 3   Suicidal  thoughts 0 0 0 3 0  PHQ-9 Score 22 4 15 24 15   Difficult doing work/chores Extremely dIfficult Somewhat difficult Very difficult Extremely dIfficult Very difficult    Assessment/Plan: Major depressive disorder, recurrent episode, mild - Plan: lamoTRIgine  (LAMICTAL ) 200 MG tablet, hydrOXYzine  (VISTARIL ) 50 MG capsule, sertraline  (ZOLOFT ) 100 MG tablet  GAD (generalized anxiety disorder) - Plan: sertraline  (ZOLOFT ) 100 MG tablet  Adjustment insomnia - Plan: temazepam (RESTORIL) 7.5 MG capsule  Grief  Patient is 63 year old Caucasian recently widowed as husband died due to sickness and complication 3 weeks ago.  Discussed grief, denial process.  He has support from his person who he treat him as his son and his therapist.  He is seeing his therapist every week.  Reviewed blood work results and current medication.  He has sleep apnea, hypertension, coronary artery disease, hyperlipidemia, diabetes, chronic pain, major depressive disorder, generalized anxiety disorder and now insomnia.  Discussed grief process.  He is not taking trazodone  because he feels groggy sleepy next day and having vivid dreams.  Recommend to consider temazepam 7.5 mg as needed for sleep to help going through grief process.  Like to keep his lamotrigine , Zoloft  and hydroxyzine .  Encourage to see his therapist more frequently to help his coping skills.  If discussed family stress as now getting sued because of the previous property and money issues.  I encouraged to call back if is any question or any concern.  We agreed to have a follow-up in 6 weeks.  I discussed about benzodiazepine dependence tolerance withdrawal and he is aware that we are providing only 10 pills to take only as needed.  Patient also do not want any benzodiazepine for long-term use.  He may resume trazodone  in the future if sleep do not get better.  I also encouraged to use CPAP every night.  Discussed safety concerns at any time having active suicidal thoughts  or homicidal thoughts and he need to call 911 or go to local emergency room.  Follow-up in 6 weeks.  Follow Up Instructions:     I discussed the assessment and treatment plan with the patient. The patient was provided an opportunity to ask questions and all were answered. The patient agreed with the plan and demonstrated an understanding of the instructions.   The patient was advised to call back or seek an in-person evaluation if the symptoms worsen or if the condition fails to improve as anticipated.    Collaboration of Care: Other provider involved in patient's care AEB notes are available in epic to review  Patient/Guardian was advised Release of Information must be obtained prior to any record release in order to collaborate their care with an outside provider. Patient/Guardian was advised if they have not already done so to contact the registration department to sign all necessary forms in order for us  to release information regarding their care.   Consent: Patient/Guardian gives verbal consent for treatment and assignment of benefits for services provided during this visit. Patient/Guardian expressed understanding and agreed to proceed.     Total encounter time 31 minutes which includes face-to-face time, chart reviewed, care coordination, order entry and documentation during this encounter.   Note: This document was prepared by [employer] voice dictation technology and any errors that results from this process are unintentional.    Leni ONEIDA Client, MD 05/10/2024

## 2024-05-15 ENCOUNTER — Ambulatory Visit (INDEPENDENT_AMBULATORY_CARE_PROVIDER_SITE_OTHER): Admitting: Licensed Clinical Social Worker

## 2024-05-15 DIAGNOSIS — F4321 Adjustment disorder with depressed mood: Secondary | ICD-10-CM | POA: Diagnosis not present

## 2024-05-15 DIAGNOSIS — F411 Generalized anxiety disorder: Secondary | ICD-10-CM | POA: Diagnosis not present

## 2024-05-15 DIAGNOSIS — F330 Major depressive disorder, recurrent, mild: Secondary | ICD-10-CM

## 2024-05-15 NOTE — Progress Notes (Unsigned)
 THERAPIST PROGRESS NOTE   Session Date: 05/15/2024  Session Time: 1505 - 1610  Participation Level: Active  MSE/Presentation: Behavior: Appropriate and Sharing Speech: Normal Thought Process: Coherent and Relevant Cognition: Alert and Appropriate Mood: Depressed and Euthymic Affect: Congruent Insight: Good Appearance: Casual  Type of Therapy: Individual Therapy  Treatment Goals addressed:   Progressing (6)  LTG: Increase coping skills to manage depression and improve ability to perform daily activities (OP Depression) STG: Reduce overall depression score by a minimum of 25% on the Patient Health Questionnaire (PHQ-9) (OP Depression) STG: Matthew Hutchinson will reduce frequency of avoidant behaviors by 50% as evidenced by self-report in therapy sessions (Anxiety) LTG: Continue to keep depression manageable when faced with the inevitable horribleness (OP Depression) LTG: Maintain abilities at managing anxiety and have minimal panic attacks (Anxiety)  Not Progressing (2) LTG: Reduce frequency, intensity, and duration of depression symptoms so that daily functioning is improved (OP Depression) STG: Matthew Hutchinson will identify cognitive patterns and beliefs that support depression (OP Depression)  Progress Towards Goals: Progressing  Interventions: CBT, Motivational Interviewing, Solution Focused, and Supportive  Summary: Matthew Hutchinson is a 63 y.o. male with psych history of MDD and GAD, presenting for follow-up therapy session in efforts to improve management of depressive and anxious symptoms.   Patient actively engaged in session, presenting in depressed and anxious moods, with congruent affect throughout session. Pt openly engaged in introductory check-in, sharing of It's been a week, further detailing of experiencing increased challenges in navigating grief over the past week, sharing of having been experiencing increased crying and anger and rage towards late spouse surrounding  passing. Pt further detailed added stress surrounding family being sued at this time, detailing of having set boundaries with family and relaying the family's need to address concerns due to pt's current focus being directed towards tending to filing all necessary documentation as it relates to late spouse. Pt shared of having experienced difficulties sleeping last night, noting of having not obtained any REM sleep, proving to feel mentally and emotionally exhausted today. Pt further shared of gradual efforts in coordinating with organizations to sell spouses collectibles to and awaiting responses. Pt shared of experiencing increased anger and resentment towards [facility] after having received a 'Read Receipt' for the last appeal that was filed for husbands continued stay at [facility] prior to his passing, sharing of feeling that could have potentially proven to have saved spouse and provided the care needed.     03/13/2024    2:35 PM 12/13/2023    5:02 PM 11/30/2023    3:14 PM 11/15/2023    3:54 PM  GAD 7 : Generalized Anxiety Score  Nervous, Anxious, on Edge 2 2 3 3   Control/stop worrying 3 2 3 3   Worry too much - different things 3 1 3 3   Trouble relaxing 2 1 3 3   Restless 3 1 3 3   Easily annoyed or irritable 3 2 3 3   Afraid - awful might happen 3 1 3 3   Total GAD 7 Score 19 10 21 21   Anxiety Difficulty Extremely difficult Very difficult Very difficult Extremely difficult      03/13/2024    2:38 PM 12/13/2023    5:03 PM 11/30/2023    3:13 PM 11/15/2023    3:55 PM 09/12/2023    3:54 PM  Depression screen PHQ 2/9  Decreased Interest 3 1 1  0 0  Down, Depressed, Hopeless 2 1 3 3 3   PHQ - 2 Score 5 2 4 3  3  Altered sleeping 3 0 1 3 3   Tired, decreased energy 2 0 2 3 1   Change in appetite 3 0  3 1  Feeling bad or failure about yourself  3 1 3 3 1   Trouble concentrating 3 1 3 3 3   Moving slowly or fidgety/restless 3 0 2 3 3   Suicidal thoughts 0 0 0 3 0  PHQ-9 Score 22 4 15 24 15   Difficult doing  work/chores Extremely dIfficult Somewhat difficult Very difficult Extremely dIfficult Very difficult   Flowsheet Row Counselor from 11/15/2023 in [facility] at [facility] from 11/24/2022 in [facility] at [facility] from 10/05/2022 in [facility] at [facility] RISK CATEGORY Moderate Risk Low Risk Low Risk    Suicidal/Homicidal: None, No plan to harm self or others  Therapist Response:  Clinician openly greeted patient upon joining virtual visit, assessing presenting mood and affect, engaging in introductory check-in, exploring daily events and presenting moods.  Utilized open ended questions in eliciting pt's recounts of events of the past week, exploring newly identified stressors, ongoing areas of difficulty, and individual means of managing challenges. Utilized active listening in providing support of pt's recounts of recent challenges and events, further validating shared thoughts and expressed feelings, providing support and empathy for pt in reflecting on continued difficulties surrounding factors relating to late spouse and grieving process. Utilized CBT, psycho-ed, and supportive reflections to aid pt in processing challenges and exploring means of managing stress.  [x]  Cognitive Challenging  [x]  Cognitive Refocusing  [x]  Cognitive Reframing  []  Communication Skills []  Compliance Issues  []  DBT  [x]  Exploration of Coping Patterns  [x]  Exploration of Emotions [x]  Exploration of Relationship Patterns  []  Guided Imagery  []  Interactive Feedback  []  Interpersonal Resolutions []  Mindfulness Training  []  Preventative Services  [x]  Psycho-Education  []  Relaxation/Deep Breathing []  Review of Treatment Plan/Progress  []  Role-Play/Behavioral Rehearsal  [x]  Structured Problem Solving  [x]  Supportive Reflection [x]  Symptom Management  []  Other  Patient responded well to interventions.  Patient continues to meet criteria for MDD and GAD. Patient will continue to benefit from engagement in outpatient therapy due to being the least restrictive service to meet presenting needs. Pt proves to maintain minimal progress towards goals.  Homework: None.  Plan: Return again in 1 weeks.  Diagnosis:  Encounter Diagnoses  Name Primary?   Major depressive disorder, recurrent episode, mild Yes   GAD (generalized anxiety disorder)    Grief    Collaboration of Care: Psychiatrist AEB provider documentation available in EHR.  Patient/Guardian was advised Release of Information must be obtained prior to any record release in order to collaborate their care with an outside provider. Patient/Guardian was advised if they have not already done so to contact the registration department to sign all necessary forms in order for us  to release information regarding their care.   Consent: Patient/Guardian gives verbal consent for treatment and assignment of benefits for services provided during this visit. Patient/Guardian expressed understanding and agreed to proceed.   Virtual Visit via Video Note  I connected with Matthew Hutchinson on 05/15/24 at  3:00 PM EST by a video enabled telemedicine application and verified that I am speaking with the correct person using two identifiers.  Location: Patient: Home Provider: Home Office   I discussed the limitations of evaluation and management by telemedicine and the availability of in person appointments. The patient expressed understanding and agreed to proceed.  The patient was advised to call back or seek an in-person evaluation if the symptoms worsen or if the condition fails to improve as anticipated.  I provided 65 minutes of non-face-to-face time during this encounter.  Lynwood JONETTA Maris, MSW, LCSW 05/15/2024,  3:06 PM

## 2024-05-21 ENCOUNTER — Ambulatory Visit (INDEPENDENT_AMBULATORY_CARE_PROVIDER_SITE_OTHER): Admitting: Licensed Clinical Social Worker

## 2024-05-21 DIAGNOSIS — F411 Generalized anxiety disorder: Secondary | ICD-10-CM | POA: Diagnosis not present

## 2024-05-21 DIAGNOSIS — F330 Major depressive disorder, recurrent, mild: Secondary | ICD-10-CM

## 2024-05-21 NOTE — Progress Notes (Unsigned)
 THERAPIST PROGRESS NOTE   Session Date: 05/21/2024  Session Time: 1605 - 1710  Participation Level: Active  MSE/Presentation: Behavior: Appropriate and Sharing Speech: Normal Thought Process: Coherent and Relevant Cognition: Alert and Appropriate Mood: Depressed and Euthymic Affect: Congruent Insight: Good Appearance: Casual  Type of Therapy: Individual Therapy  Treatment Goals addressed:   Progressing (6)  LTG: Increase coping skills to manage depression and improve ability to perform daily activities (OP Depression) STG: Reduce overall depression score by a minimum of 25% on the Patient Health Questionnaire (PHQ-9) (OP Depression) STG: Aquiles will reduce frequency of avoidant behaviors by 50% as evidenced by self-report in therapy sessions (Anxiety) LTG: Continue to keep depression manageable when faced with the inevitable horribleness (OP Depression) LTG: Maintain abilities at managing anxiety and have minimal panic attacks (Anxiety)  Not Progressing (2) LTG: Reduce frequency, intensity, and duration of depression symptoms so that daily functioning is improved (OP Depression) STG: Sergio will identify cognitive patterns and beliefs that support depression (OP Depression)  Progress Towards Goals: Progressing  Interventions: CBT, Motivational Interviewing, Solution Focused, and Supportive  Summary: Matthew Hutchinson is a 63 y.o. male with psych history of MDD and GAD, presenting for follow-up therapy session in efforts to improve management of depressive and anxious symptoms.   Patient actively engaged in session, presenting in depressed and anxious moods, with congruent affect throughout session. Pt openly engaged in introductory check-in, sharing of I'm maintaining, putting some stuff off, sharing of having only switched desk calendar to November as of today, expressing having historically associated this with awareness of not doing well. Further explored pt's  individual association with dates and events, processing outlooks and abilities in reframing perspectives of events and holidays, uncertainty of what the holidays will hold and who pt will spend time with over the coming holidays. Pt shared of ongoing feelings of anhedonia and challenges completing daily tasks, proving receptive for this to be congruent with recent events, however finding self torn over inabilities to simply act on presenting tasks in alignment with pt's typical approach to responsibilities. Pt shared of experiencing increased difficulties in relation to dates/anniversaries of events, recounting extensive hx surrounding personal experienced traumas and having spent decades avoiding leaving the house on specific date, expressing potential for being mildly superstitious. Actively processed abilities in shifting perspectives and/or reframing dates associated with negative events, to more positive, celebrations, and engaging in enjoyable activities that pt find to be associated with, or reminders of, individuals or events. Pt expressed potential interest in exploring EMDR for support in navigating increased challenges associated with Thursday's and Friday's, being the final day's prior to spouses passing.     03/13/2024    2:35 PM 12/13/2023    5:02 PM 11/30/2023    3:14 PM 11/15/2023    3:54 PM  GAD 7 : Generalized Anxiety Score  Nervous, Anxious, on Edge 2 2 3 3   Control/stop worrying 3 2 3 3   Worry too much - different things 3 1 3 3   Trouble relaxing 2 1 3 3   Restless 3 1 3 3   Easily annoyed or irritable 3 2 3 3   Afraid - awful might happen 3 1 3 3   Total GAD 7 Score 19 10 21 21   Anxiety Difficulty Extremely difficult Very difficult Very difficult Extremely difficult      03/13/2024    2:38 PM 12/13/2023    5:03 PM 11/30/2023    3:13 PM 11/15/2023    3:55 PM 09/12/2023    3:54 PM  Depression screen PHQ 2/9  Decreased Interest 3 1 1  0 0  Down, Depressed, Hopeless 2 1 3 3 3   PHQ - 2  Score 5 2 4 3 3   Altered sleeping 3 0 1 3 3   Tired, decreased energy 2 0 2 3 1   Change in appetite 3 0  3 1  Feeling bad or failure about yourself  3 1 3 3 1   Trouble concentrating 3 1 3 3 3   Moving slowly or fidgety/restless 3 0 2 3 3   Suicidal thoughts 0 0 0 3 0  PHQ-9 Score 22  4  15  24  15    Difficult doing work/chores Extremely dIfficult Somewhat difficult Very difficult Extremely dIfficult Very difficult     Data saved with a previous flowsheet row definition   Flowsheet Row Counselor from 11/15/2023 in [facility] from 11/24/2022 in [facility] at [facility] from 10/05/2022 in [facility] at [facility] RISK CATEGORY Moderate Risk Low Risk Low Risk    Suicidal/Homicidal: None, No plan to harm self or others  Therapist Response:  Clinician openly greeted patient upon joining visit, assessing presenting mood and affect, engaging in introductory check-in, exploring daily events and presenting moods.  Utilized open ended questions in exploring pt's recounts of events of the past week, exploring new and ongoing stressors, and individual means of managing challenges. Utilized active listening in providing support of pt's recounts of challenges and events, further validating thoughts and expressed feelings, providing support and empathy for pt in reflecting on continued difficulties surrounding factors relating to late spouse and grieving process. Utilized CBT, psycho-ed, and supportive reflections to aid pt in processing challenges and exploring means of managing stress.  [x]  Cognitive Challenging  [x]  Cognitive Refocusing  [x]  Cognitive Reframing  []  Communication Skills []  Compliance Issues  []  DBT  [x]  Exploration of Coping Patterns  [x]  Exploration of Emotions [x]  Exploration of Relationship Patterns  []  Guided Imagery  []  Interactive Feedback  []  Interpersonal  Resolutions []  Mindfulness Training  []  Preventative Services  [x]  Psycho-Education  []  Relaxation/Deep Breathing []  Review of Treatment Plan/Progress  []  Role-Play/Behavioral Rehearsal  [x]  Structured Problem Solving  [x]  Supportive Reflection [x]  Symptom Management  []  Other  Patient responded well to interventions. Patient continues to meet criteria for MDD and GAD. Patient will continue to benefit from engagement in outpatient therapy due to being the least restrictive service to meet presenting needs. Pt proves to maintain minimal progress towards goals.  Homework: None.  Plan: Return again in 1 weeks.  Diagnosis:  Encounter Diagnoses  Name Primary?   Major depressive disorder, recurrent episode, mild Yes   GAD (generalized anxiety disorder)     Collaboration of Care: Psychiatrist AEB provider documentation available in EHR.  Patient/Guardian was advised Release of Information must be obtained prior to any record release in order to collaborate their care with an outside provider. Patient/Guardian was advised if they have not already done so to contact the registration department to sign all necessary forms in order for us  to release information regarding their care.   Consent: Patient/Guardian gives verbal consent for treatment and assignment of benefits for services provided during this visit. Patient/Guardian expressed understanding and agreed to proceed.   Virtual Visit via Video Note  I connected with Matthew Hutchinson on 05/21/24 at  4:00 PM EST by a video enabled telemedicine application and verified that I am speaking with the  correct person using two identifiers.  Location: Patient: Home Provider: Home Office   I discussed the limitations of evaluation and management by telemedicine and the availability of in person appointments. The patient expressed understanding and agreed to proceed.   The patient was advised to call back or seek an in-person evaluation if  the symptoms worsen or if the condition fails to improve as anticipated.  I provided 65 minutes of non-face-to-face time during this encounter.  Lynwood JONETTA Maris, MSW, LCSW 05/21/2024,  4:06 PM

## 2024-05-28 ENCOUNTER — Ambulatory Visit (INDEPENDENT_AMBULATORY_CARE_PROVIDER_SITE_OTHER): Admitting: Licensed Clinical Social Worker

## 2024-05-28 DIAGNOSIS — F330 Major depressive disorder, recurrent, mild: Secondary | ICD-10-CM

## 2024-05-28 DIAGNOSIS — F411 Generalized anxiety disorder: Secondary | ICD-10-CM

## 2024-05-28 DIAGNOSIS — F4321 Adjustment disorder with depressed mood: Secondary | ICD-10-CM | POA: Diagnosis not present

## 2024-05-28 NOTE — Progress Notes (Unsigned)
 THERAPIST PROGRESS NOTE   Session Date: 05/28/2024  Session Time: 1309 - 1403  Participation Level: Active  MSE/Presentation: Behavior: Appropriate and Sharing Speech: Normal Thought Process: Coherent and Relevant Cognition: Alert and Appropriate Mood: Depressed and Euthymic Affect: Congruent Insight: Good Appearance: Casual  Type of Therapy: Individual Therapy  Treatment Goals addressed:   Progressing (6)  LTG: Increase coping skills to manage depression and improve ability to perform daily activities (OP Depression) STG: Reduce overall depression score by a minimum of 25% on the Patient Health Questionnaire (PHQ-9) (OP Depression) STG: Deloy will reduce frequency of avoidant behaviors by 50% as evidenced by self-report in therapy sessions (Anxiety) LTG: Continue to keep depression manageable when faced with the inevitable horribleness (OP Depression) LTG: Maintain abilities at managing anxiety and have minimal panic attacks (Anxiety)  Not Progressing (2) LTG: Reduce frequency, intensity, and duration of depression symptoms so that daily functioning is improved (OP Depression) STG: Gavon will identify cognitive patterns and beliefs that support depression (OP Depression)  Progress Towards Goals: Progressing  Interventions: CBT, Motivational Interviewing, Solution Focused, and Supportive  Summary: Matthew Hutchinson is a 63 y.o. male with psych history of MDD and GAD, presenting for follow-up therapy session in efforts to improve management of depressive and anxious symptoms.   Patient actively engaged in session, presenting in depressed and anxious moods, with congruent affect throughout session. Pt openly engaged in introductory check-in, sharing of I'm hanging in three, further sharing of having gotten a lot of tasks taken care of over the past week, noting of nothing proving to be physically exhausting, with all task being mentally taxing. Pt further acknowledged  of Friday proving to be horrible, specifying Friday being the anniversary of late spouses accident that proved to be the triggering event of his decline in health over the past year. Pt further detailed efforts surrounding closing accounts and anticipated anxiousness surrounding creditors attempting to recoup spouses outstanding debt.  Pt further shared of recent interactions with nieces and nephews over the past week, detailing intentions to update own Will and designations as pt reports of having not done so for a number of decades, expressing recent realization that mortality is setting in, further processing thoughts and feelings surrounding allocations of estate amongst beneficiaries, needing to update beneficiaries to reflect additional births of children amongst the family, and whom pt would consider to be executor. Pt further engaged in reflection of family hx, upbringing, and interactions between siblings, siblings spouses, and the current dynamic of relationships with pt's two remaining living brothers.  Pt shared of continuing to experience difficulties sleeping on varying nights throughout the week, noting continued challenges with Friday's and the association of spouses passing with that specific day, sharing of taking Temazepam PRN, however still finding challenges getting to sleep.     03/13/2024    2:35 PM 12/13/2023    5:02 PM 11/30/2023    3:14 PM 11/15/2023    3:54 PM  GAD 7 : Generalized Anxiety Score  Nervous, Anxious, on Edge 2 2 3 3   Control/stop worrying 3 2 3 3   Worry too much - different things 3 1 3 3   Trouble relaxing 2 1 3 3   Restless 3 1 3 3   Easily annoyed or irritable 3 2 3 3   Afraid - awful might happen 3 1 3 3   Total GAD 7 Score 19 10 21 21   Anxiety Difficulty Extremely difficult Very difficult Very difficult Extremely difficult      03/13/2024    2:38  PM 12/13/2023    5:03 PM 11/30/2023    3:13 PM 11/15/2023    3:55 PM 09/12/2023    3:54 PM  Depression screen PHQ  2/9  Decreased Interest 3 1 1  0 0  Down, Depressed, Hopeless 2 1 3 3 3   PHQ - 2 Score 5 2 4 3 3   Altered sleeping 3 0 1 3 3   Tired, decreased energy 2 0 2 3 1   Change in appetite 3 0  3 1  Feeling bad or failure about yourself  3 1 3 3 1   Trouble concentrating 3 1 3 3 3   Moving slowly or fidgety/restless 3 0 2 3 3   Suicidal thoughts 0 0 0 3 0  PHQ-9 Score 22  4  15  24  15    Difficult doing work/chores Extremely dIfficult Somewhat difficult Very difficult Extremely dIfficult Very difficult     Data saved with a previous flowsheet row definition   Flowsheet Row Counselor from 11/15/2023 in [facility] from 11/24/2022 in [facility] from 10/05/2022 in [facility] at [facility] RISK CATEGORY Moderate Risk Low Risk Low Risk    Suicidal/Homicidal: None, No plan to harm self or others  Therapist Response:  Clinician openly greeted patient upon joining visit, assessing presenting mood and affect, engaging in introductory check-in, exploring daily events and presenting moods.  Utilized open ended questions in exploring pt's recounts of events of the past week, further supporting pt in exploring newly identified requirements with closing spouses accounts and ongoing stressors, and individual efforts of managing challenges. Utilized active listening in providing support of pt's recounts of the past week, further validating thoughts and expressed feelings, providing support and empathy for pt in ongoing difficulties surrounding factors relating to late spouse and closing of accounts. Utilized CBT, psycho-ed, and supportive reflections to aid pt in processing challenges and exploring means of managing stress.  [x]  Cognitive Challenging  [x]  Cognitive Refocusing  [x]  Cognitive Reframing  []  Communication Skills []  Compliance Issues  []  DBT  [x]  Exploration of  Coping Patterns  [x]  Exploration of Emotions [x]  Exploration of Relationship Patterns  []  Guided Imagery  []  Interactive Feedback  []  Interpersonal Resolutions []  Mindfulness Training  []  Preventative Services  [x]  Psycho-Education  []  Relaxation/Deep Breathing []  Review of Treatment Plan/Progress  []  Role-Play/Behavioral Rehearsal  [x]  Structured Problem Solving  [x]  Supportive Reflection [x]  Symptom Management  []  Other  Patient responded well to interventions. Patient continues to meet criteria for MDD and GAD. Patient will continue to benefit from engagement in outpatient therapy due to being the least restrictive service to meet presenting needs. Pt proves to maintain minimal progress towards goals.  Homework: None.  Plan: Return again in 1 weeks.  Diagnosis:  Encounter Diagnoses  Name Primary?   Major depressive disorder, recurrent episode, mild Yes   GAD (generalized anxiety disorder)    Grief      Collaboration of Care: Psychiatrist AEB provider documentation available in EHR.  Patient/Guardian was advised Release of Information must be obtained prior to any record release in order to collaborate their care with an outside provider. Patient/Guardian was advised if they have not already done so to contact the registration department to sign all necessary forms in order for us  to release information regarding their care.   Consent: Patient/Guardian gives verbal consent for treatment and assignment of benefits for services provided during this visit. Patient/Guardian expressed  understanding and agreed to proceed.   Virtual Visit via Video Note  I connected with Matthew Hutchinson on 05/28/24 at  1:00 PM EST by a video enabled telemedicine application and verified that I am speaking with the correct person using two identifiers.  Location: Patient: Home Provider: Home Office   I discussed the limitations of evaluation and management by telemedicine and the availability of  in person appointments. The patient expressed understanding and agreed to proceed.   The patient was advised to call back or seek an in-person evaluation if the symptoms worsen or if the condition fails to improve as anticipated.  I provided 54 minutes of non-face-to-face time during this encounter.  Lynwood JONETTA Maris, MSW, LCSW 05/28/2024,  1:10 PM

## 2024-05-30 ENCOUNTER — Encounter: Payer: Self-pay | Admitting: Family Medicine

## 2024-05-30 ENCOUNTER — Ambulatory Visit (INDEPENDENT_AMBULATORY_CARE_PROVIDER_SITE_OTHER): Admitting: Family Medicine

## 2024-05-30 VITALS — BP 110/62 | HR 69 | Temp 98.9°F | Resp 12 | Ht 74.5 in | Wt 292.8 lb

## 2024-05-30 DIAGNOSIS — E785 Hyperlipidemia, unspecified: Secondary | ICD-10-CM | POA: Diagnosis not present

## 2024-05-30 DIAGNOSIS — E162 Hypoglycemia, unspecified: Secondary | ICD-10-CM

## 2024-05-30 DIAGNOSIS — Z7985 Long-term (current) use of injectable non-insulin antidiabetic drugs: Secondary | ICD-10-CM

## 2024-05-30 DIAGNOSIS — E1165 Type 2 diabetes mellitus with hyperglycemia: Secondary | ICD-10-CM | POA: Diagnosis not present

## 2024-05-30 DIAGNOSIS — I10 Essential (primary) hypertension: Secondary | ICD-10-CM

## 2024-05-30 LAB — POCT GLYCOSYLATED HEMOGLOBIN (HGB A1C): Hemoglobin A1C: 5.6 % (ref 4.0–5.6)

## 2024-05-30 LAB — GLUCOSE, POCT (MANUAL RESULT ENTRY): POC Glucose: 88 mg/dL (ref 70–99)

## 2024-05-30 MED ORDER — TIRZEPATIDE 5 MG/0.5ML ~~LOC~~ SOAJ: 5.0000 mg | SUBCUTANEOUS | 1 refills | Status: DC

## 2024-05-30 NOTE — Progress Notes (Signed)
 Subjective:  Patient ID: BRYLER DIBBLE, male    DOB: 03-11-1961  Age: 63 y.o. MRN: [id number]  CC:  Chief Complaint  Patient presents with   Follow-up    Husband recently passed. He is grieving.    Diabetes    No questions or concerns.    Insomnia    Due to husbands passing. Only getting 4-5 hours of sleep a night. Patient is seeing a therapist and psych to help with grief and sleep.     HPI Merwyn Hodapp Benton-Elliot presents for   Diabetes: Discussed at October 24 visit.  Some hypoglycemia initially with decreased intake after passing of spouse.  Had been improving at that visit.  Continued same med regimen at that time, and plan for A1c today.  He had restarted his metformin  after stopping it temporarily as blood sugars were in the 250s and up to 300 range, had been back on metformin  for 4 days with 55% of his readings between 70-180.  No lows below 54 within the prior 7 days on his glucometer.  13% above 250. Currently taking metformin  BID, mounjaro  7.5mg  weekly. Rare drop - lowest 59 few days ago when he had not eaten. No 300's. Highest 150-160.  Has been trying to eat better and more regularly. Has not needed Gvoke treatment.  Has taken meds today, nothing to eat  - alarm off during visit on his cgm. Reading 68 - poct glucose by fingerstick 88.  Weight has improved.  Microalbumin: normal ratio 12/01/23.  On statin with simvastatin  20mg  per day - no new side effects.  On RB with losartan  hydrochlorothiazide .  Optho, foot exam, pneumovax: UTD.   Lab Results  Component Value Date   HGBA1C 5.6 05/30/2024   HGBA1C 6.1 03/01/2024   HGBA1C 5.8 12/01/2023   Lab Results  Component Value Date   MICROALBUR 1.8 12/01/2023   LDLCALC 56 03/01/2024   CREATININE 0.93 03/01/2024   Wt Readings from Last 3 Encounters:  05/30/24 292 lb 12.8 oz (132.8 kg)  05/04/24 (!) 301 lb 6.4 oz (136.7 kg)  04/11/24 (!) 310 lb 9.6 oz (140.9 kg)    Hypertension: Losartan  hct 100/25mg  every  day, amlodipine  5mg  every day.  Home readings: none.  BP Readings from Last 3 Encounters:  05/30/24 110/62  05/04/24 (!) 114/58  04/11/24 (!) 146/79   Lab Results  Component Value Date   CREATININE 0.93 03/01/2024   History of anxiety, major depressive disorder followed by psychiatry and therapist.  Understandable grief as above with the passing of his spouse which has affected his sleep.  He has been working with his mental health team during this time. Has been started on med to help with sleep - temazepam . Appt with psychiatry in 2 weeks.  Denies SI.  Variable sleep times.     History Patient Active Problem List   Diagnosis Date Noted   Chest wall pain 03/27/2024   Lower extremity edema 03/27/2024   Dysphagia 11/29/2023   Encounter for screening for other metabolic disorders 11/29/2023   Screening for malignant neoplasm of colon 11/29/2023   Epigastric pain 11/29/2023   Major depressive disorder, recurrent episode, mild 06/08/2023   GAD (generalized anxiety disorder) 06/08/2023   Pain due to onychomycosis of toenails of both feet 06/01/2023   Hammer toes of both feet 03/30/2023   Pain in joint of left shoulder 12/20/2022   Thoracic back pain 12/20/2022   Chronic pain of left lower extremity 09/30/2022   Type 2 diabetes mellitus  with hyperglycemia, with long-term current use of insulin  (HCC) 09/30/2022   Hiccups 04/01/2022   Newly diagnosed diabetes (HCC) 07/27/2018   Morbid obesity (HCC) 02/02/2017   Chronic pain due to trauma 06/25/2013   Depression, recurrent (HCC) 11/09/2012   Coronary artery disease, non-occlusive 02/21/2012   Hyperlipidemia associated with type 2 diabetes mellitus (HCC) 02/21/2012   Angina pectoris 09/27/2011   Obstructive sleep apnea 05/20/2010   Essential hypertension 04/17/2010   COPD mixed type (HCC) 04/17/2010   WEIGHT GAIN, ABNORMAL 04/17/2010   Past Medical History:  Diagnosis Date   Allergy    Anxiety    Arthritis    COPD (chronic  obstructive pulmonary disease) (HCC)    Depression    Diabetes mellitus without complication (HCC) 2020   Emphysema of lung (HCC)    Hyperlipidemia    Morbid obesity (HCC)    OSA (obstructive sleep apnea)    uses CPAP nightly   PONV (postoperative nausea and vomiting)    Unspecified essential hypertension    Past Surgical History:  Procedure Laterality Date   CARDIAC CATHETERIZATION  2000   ESOPHAGOGASTRODUODENOSCOPY (EGD) WITH PROPOFOL  N/A 10/16/2021   Procedure: ESOPHAGOGASTRODUODENOSCOPY (EGD) WITH PROPOFOL ;  Surgeon: Rollin Dover, MD;  Location: [facility];  Service: Endoscopy;  Laterality: N/A;   INSERTION OF MESH N/A 09/18/2015   Procedure: INSERTION OF MESH;  Surgeon: Vicenta Poli, MD;  Location: [facility];  Service: General;  Laterality: N/A;   LEG SURGERY Left    SAVORY DILATION N/A 10/16/2021   Procedure: SAVORY DILATION;  Surgeon: Rollin Dover, MD;  Location: [facility];  Service: Endoscopy;  Laterality: N/A;   SHOULDER ARTHROSCOPY Left    TONSILLECTOMY     UMBILICAL HERNIA REPAIR N/A 09/18/2015   Procedure: HERNIA REPAIR UMBILICAL ADULT WITH MESH ;  Surgeon: Vicenta Poli, MD;  Location: [facility];  Service: General;  Laterality: N/A;   Allergies  Allergen Reactions   Penicillins Anaphylaxis   Valsartan Itching and Swelling   Prior to Admission medications   Medication Sig Start Date End Date Taking? Authorizing Provider  amLODipine  (NORVASC ) 5 MG tablet Take 1 tablet (5 mg total) by mouth daily. 03/01/24  Yes Levora Reyes SAUNDERS, MD  ARNUITY ELLIPTA  100 MCG/ACT AEPB TAKE 1 PUFF BY MOUTH EVERY DAY 04/13/24  Yes Levora Reyes SAUNDERS, MD  aspirin  81 MG tablet Take 1 tablet (81 mg total) by mouth daily. 11/13/12  Yes Lord, Sharlot GRADE, NP  Carboxymethylcellul-Glycerin (LUBRICATING EYE DROPS OP) Place 1 drop into both eyes daily as needed (dry eyes).   Yes [provider]  Continuous Glucose Sensor (FREESTYLE LIBRE 3 SENSOR) MISC  Apply as instructed, change every 10 days. 08/30/23  Yes Levora Reyes SAUNDERS, MD  Continuous Glucose Sensor (FREESTYLE LIBRE 3 SENSOR) MISC APPLY AS INSTRUCTED, CHANGE EVERY 10 DAYS. 08/30/23  Yes Levora Reyes SAUNDERS, MD  Continuous Glucose Sensor (FREESTYLE LIBRE 3 SENSOR) MISC APPLY AS INSTRUCTED, CHANGE EVERY 14 DAYS 08/30/23  Yes Levora Reyes SAUNDERS, MD  gabapentin  (NEURONTIN ) 300 MG capsule TAKE 2 CAPSULES BY MOUTH 2 TIMES DAILY. 03/01/24  Yes Levora Reyes SAUNDERS, MD  hydrOXYzine  (VISTARIL ) 50 MG capsule TAKE 2 CAPSULES BY MOUTH AT BEDTIME 05/10/24  Yes Arfeen, Leni DASEN, MD  lamoTRIgine  (LAMICTAL ) 200 MG tablet Take 1 tablet (200 mg total) by mouth daily. 05/10/24  Yes Arfeen, Leni DASEN, MD  losartan -hydrochlorothiazide  (HYZAAR) 100-25 MG tablet Take 1 tablet by mouth daily. 03/01/24  Yes Levora Reyes SAUNDERS, MD  metFORMIN  (GLUCOPHAGE ) 500  MG tablet Take 1 tablet (500 mg total) by mouth 2 (two) times daily with a meal. 03/01/24  Yes Levora Reyes SAUNDERS, MD  nitroGLYCERIN  (NITROSTAT ) 0.4 MG SL tablet Place 1 tablet (0.4 mg total) under the tongue every 5 (five) minutes as needed for chest pain. If chest pain not resolved, after 3 doses 5 minutes apart--call 911 11/13/12  Yes Lord, Sharlot GRADE, NP  sertraline  (ZOLOFT ) 100 MG tablet TAKE 1 TABLET BY MOUTH DAILY 05/10/24  Yes Arfeen, Leni DASEN, MD  simvastatin  (ZOCOR ) 20 MG tablet TAKE 1 TABLET BY MOUTH EVERYDAY AT BEDTIME 03/01/24  Yes Levora Reyes SAUNDERS, MD  temazepam  (RESTORIL ) 7.5 MG capsule Take 1 capsule (7.5 mg total) by mouth at bedtime as needed for sleep. 05/10/24  Yes Arfeen, Leni DASEN, MD  tirzepatide  (MOUNJARO ) 7.5 MG/0.5ML Pen Inject 7.5 mg into the skin once a week. 03/01/24  Yes Levora Reyes SAUNDERS, MD  traMADol  (ULTRAM ) 50 MG tablet Take 50 mg by mouth daily as needed for severe pain.   Yes [provider]  traZODone  (DESYREL ) 100 MG tablet Take 1 tablet (100 mg total) by mouth at bedtime as needed for sleep. 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  Tobacco Use   Smoking status: Former    Current packs/day: 0.00    Average packs/day: 2.0 packs/day for 26.0 years (52.0 ttl pk-yrs)    Types: Cigarettes    Start date: 08/12/1977    Quit date: 08/13/2003    Years since quitting: 20.8   Smokeless tobacco: Never   Tobacco comments:    2ppd x 26 years  Vaping Use   Vaping status: Never Used  Substance and Sexual Activity   Alcohol  use: No    Alcohol /week: 0.0 standard drinks of alcohol     Comment: social   Drug use: No   Sexual activity: Yes    Partners: Male    Birth control/protection: None  Other Topics Concern   Not on file  Social History Narrative   Married. Education: college. Exercise: No.   Social Drivers of Health   Financial Resource Strain: Medium Risk (04/30/2024)   Overall Financial Resource Strain (CARDIA)    Difficulty of Paying Living Expenses: Somewhat hard  Food Insecurity: No Food Insecurity (04/30/2024)   Hunger Vital Sign    Worried About Running Out of Food in the Last Year: Never true    Ran Out of Food in the Last Year: Never true  Transportation Needs: No Transportation Needs (04/30/2024)   PRAPARE - Administrator, Civil Service (Medical): No    Lack of Transportation (Non-Medical): No  Physical Activity: Insufficiently Active (04/30/2024)   Exercise Vital Sign    Days of Exercise per Week: 1 day    Minutes of Exercise per Session: 20 min  Stress: Stress Concern Present (04/30/2024)   [employer] of Occupational Health - Occupational Stress Questionnaire    Feeling of Stress: Very much  Social Connections: Socially Isolated (04/30/2024)   Social Connection and Isolation Panel    Frequency of Communication with Friends and  Family: More than three times a week    Frequency of Social Gatherings with Friends and Family: Once  a week    Attends Religious Services: Never    Active Member of Clubs or Organizations: No    Attends Banker Meetings: Not on file    Marital Status: Widowed  Intimate Partner Violence: Not on file    Review of Systems Per HPI  Objective:   Vitals:   05/30/24 1527  BP: 110/62  Pulse: 69  Resp: 12  Temp: 98.9 F (37.2 C)  TempSrc: Temporal  SpO2: 93%  Weight: 292 lb 12.8 oz (132.8 kg)  Height: 6' 2.5 (1.892 m)     Physical Exam Vitals reviewed.  Constitutional:      Appearance: He is well-developed.  HENT:     Head: Normocephalic and atraumatic.  Neck:     Vascular: No carotid bruit or JVD.  Cardiovascular:     Rate and Rhythm: Normal rate and regular rhythm.     Heart sounds: Normal heart sounds. No murmur heard. Pulmonary:     Effort: Pulmonary effort is normal.     Breath sounds: Normal breath sounds. No rales.  Musculoskeletal:     Right lower leg: No edema.     Left lower leg: No edema.  Skin:    General: Skin is warm and dry.  Neurological:     Mental Status: He is alert and oriented to person, place, and time.  Psychiatric:        Mood and Affect: Mood normal.       Results for orders placed or performed in visit on 05/30/24  POCT Glucose (CBG)   Collection Time: 05/30/24  3:57 PM  Result Value Ref Range   POC Glucose 88 70 - 99 mg/dl  POCT glycosylated hemoglobin (Hb A1C)   Collection Time: 05/30/24  4:10 PM  Result Value Ref Range   Hemoglobin A1C 5.6 4.0 - 5.6 %   HbA1c POC (<> result, manual entry)     HbA1c, POC (prediabetic range)     HbA1c, POC (controlled diabetic range)      Assessment & Plan:  BENEDETTO RYDER is a 63 y.o. male . Hypoglycemia - Plan: POCT Glucose (CBG), tirzepatide  (MOUNJARO ) 5 MG/0.5ML Pen  Type 2 diabetes mellitus with hyperglycemia, without long-term current use of insulin  (HCC) - Plan:  POCT glycosylated hemoglobin (Hb A1C), tirzepatide  (MOUNJARO ) 5 MG/0.5ML Pen  Essential hypertension  Hyperlipidemia, unspecified hyperlipidemia type   Very well-controlled A1c, and given prior hypoglycemia, will lower dose of Mounjaro .  Will need to continue to keep an eye out for his home readings and decide if further lowering or stopping that medication may be needed.  Advised to try to obtain regular meals is much as possible.  Understandably difficult time, followed by psychiatry including new medication to help with sleep.  Hypertension, hyperlipidemia stable based on most recent labs.  Will recheck again next visit.  No med changes other than Mounjaro  as above, continue metformin  same dose for now.  RTC precautions given.  Meds ordered this encounter  Medications   tirzepatide  (MOUNJARO ) 5 MG/0.5ML Pen    Sig: Inject 5 mg into the skin once a week.    Dispense:  2 mL    Refill:  1   Patient Instructions  Based on a very well-controlled A1c I think we should back off the Mounjaro .  I have sent in the 5 mg dose.  Continue metformin  same dose for now.  Make sure you try to eat regularly throughout the day and try not to skip meals as that has  likely contributed to the low blood sugars previously.  If you continue to have some difficulty with appetite, we may need to back off further on the Mounjaro .  If you do have a low reading on your glucometer, double check with a fingerstick reading as that is more accurate.  If you continue to have low blood sugars we will likely need to decrease your medication doses further.  Keep me posted.  Cholesterol levels and other labs looked okay in August.  Will repeat those next visit.  Keep follow-up with psychiatry as planned.  Hope your new medicine will start to provide some relief.  Hang in there.     Signed,   Reyes Pines, MD [facility], [facility] 05/30/24 4:20 PM

## 2024-05-30 NOTE — Patient Instructions (Addendum)
 Based on a very well-controlled A1c I think we should back off the Mounjaro .  I have sent in the 5 mg dose.  Continue metformin  same dose for now.  Make sure you try to eat regularly throughout the day and try not to skip meals as that has likely contributed to the low blood sugars previously.  If you continue to have some difficulty with appetite, we may need to back off further on the Mounjaro .  If you do have a low reading on your glucometer, double check with a fingerstick reading as that is more accurate.  If you continue to have low blood sugars we will likely need to decrease your medication doses further.  Keep me posted.  Cholesterol levels and other labs looked okay in August.  Will repeat those next visit.  Keep follow-up with psychiatry as planned.  Hope your new medicine will start to provide some relief.  Hang in there.

## 2024-06-04 ENCOUNTER — Ambulatory Visit (HOSPITAL_COMMUNITY): Admitting: Licensed Clinical Social Worker

## 2024-06-04 DIAGNOSIS — F330 Major depressive disorder, recurrent, mild: Secondary | ICD-10-CM | POA: Diagnosis not present

## 2024-06-04 DIAGNOSIS — F4321 Adjustment disorder with depressed mood: Secondary | ICD-10-CM | POA: Insufficient documentation

## 2024-06-04 DIAGNOSIS — F411 Generalized anxiety disorder: Secondary | ICD-10-CM
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
## 2024-06-12 ENCOUNTER — Ambulatory Visit (HOSPITAL_COMMUNITY): Admitting: Licensed Clinical Social Worker

## 2024-06-12 DIAGNOSIS — F4321 Adjustment disorder with depressed mood: Secondary | ICD-10-CM

## 2024-06-12 DIAGNOSIS — F411 Generalized anxiety disorder: Secondary | ICD-10-CM

## 2024-06-12 DIAGNOSIS — F330 Major depressive disorder, recurrent, mild: Secondary | ICD-10-CM | POA: Diagnosis not present

## 2024-06-12 DIAGNOSIS — F5102 Adjustment insomnia: Secondary | ICD-10-CM

## 2024-06-12 NOTE — Progress Notes (Unsigned)
 THERAPIST PROGRESS NOTE   Session Date: 06/12/2024  Session Time: 1609 - 1720  Participation Level: Active  MSE/Presentation: Behavior: Appropriate and Sharing Speech: Normal Thought Process: Coherent and Relevant Cognition: Alert and Appropriate Mood: Depressed and Euthymic Affect: Congruent Insight: Good Appearance: Casual  Type of Therapy: Individual Therapy  Treatment Goals addressed:   Progressing (6)  LTG: Increase coping skills to manage depression and improve ability to perform daily activities (OP Depression) STG: Reduce overall depression score by a minimum of 25% on the Patient Health Questionnaire (PHQ-9) (OP Depression) STG: Matthew Hutchinson will reduce frequency of avoidant behaviors by 50% as evidenced by self-report in therapy sessions (Anxiety) LTG: Continue to keep depression manageable when faced with the inevitable horribleness (OP Depression) LTG: Maintain abilities at managing anxiety and have minimal panic attacks (Anxiety)  Not Progressing (2) LTG: Reduce frequency, intensity, and duration of depression symptoms so that daily functioning is improved (OP Depression) STG: Matthew Hutchinson will identify cognitive patterns and beliefs that support depression (OP Depression)  Progress Towards Goals: Progressing  Interventions: CBT, Motivational Interviewing, Solution Focused, and Supportive  Summary: Matthew Hutchinson is a 63 y.o. male with psych history of MDD and GAD, presenting for follow-up therapy session in efforts to improve management of depressive and anxious symptoms.   Patient was actively engaged in session, presenting with variable mood marked by intermittent depression but overall pleasant demeanor and congruent affect. During check-in, patient described feeling "just blah," with recent fluctuations between loneliness, sadness, and general apathy.  Patient reflected on Thanksgiving with family in [place], reporting avoidance of his brother due to  financial/legal stressors, and shared retrieving his spouse's ashes and considering market researcher. Utilized supportive reflection and grief-specific interventions to assist patient in identifying emotional triggers, normalizing grief variability, and framing current experiences within expected patterns of bereavement.  Patient reported significant sleep variability since returning home--16-18 hrs on Thursday/Friday, 2 hrs on Saturday, and ~6 hrs on Sunday and Monday. CBT techniques were used to explore the relationship between fatigue, behavioral activation efforts, emotional strain, and disrupted sleep. Provided psychoeducation on grief-related sleep disturbances and the impact of increased cognitive and emotional workload (household tasks, closing spouse\'s accounts, staying occupied to reduce rumination).  Patient noted experiencing "a lot of realizations" around grief, describing heightened awareness of "lasts" and upcoming "firsts." Interventions emphasized cognitive reframing, validating anticipatory grief, and strengthening coping strategies for emotional spikes around meaningful dates and transitions.  Session additionally explored patient\'s interest in supporting LGBTQIA+ youth through fostering or adoption, and his increased engagement in home projects. Supportive exploration and CBT-based decision-making strategies were used to clarify motivation, assess readiness, and evaluate potential supports. Patient also expressed desire to adopt a puppy for companionship; discussed using meaningful connection and structured routines as adaptive coping strategies.     05/30/2024    3:34 PM 03/13/2024    2:35 PM 12/13/2023    5:02 PM 11/30/2023    3:14 PM  GAD 7 : Generalized Anxiety Score  Nervous, Anxious, on Edge 2 2 2 3  Control/stop worrying 3 3 2 3  Worry too much - different things 1 3 1 3  Trouble relaxing 1 2 1 3  Restless 1 3 1 3  Easily annoyed or irritable 2 3 2 3  Afraid - awful might  happen 2 3 1 3  Total GAD 7 Score 12 19 10 21  Anxiety Difficulty Very difficult Extremely difficult Very difficult Very difficult      11 /19/2025    3:33 PM 03/13/2024  2:38 PM 12/13/2023    5:03 PM 11/30/2023    3:13 PM 11/15/2023    3:55 PM  Depression screen PHQ 2/9  Decreased Interest 1 3 1 1  0  Down, Depressed, Hopeless 1 2 1 3 3   PHQ - 2 Score 2 5 2 4 3   Altered sleeping 3 3 0 1 3  Tired, decreased energy 2 2 0 2 3  Change in appetite 2 3 0  3  Feeling bad or failure about yourself  1 3 1 3 3   Trouble concentrating 1 3 1 3 3   Moving slowly or fidgety/restless 3 3 0 2 3  Suicidal thoughts 0 0 0 0 3  PHQ-9 Score 14 22  4  15  24    Difficult doing work/chores Somewhat difficult Extremely dIfficult Somewhat difficult Very difficult Extremely dIfficult     Data saved with a previous flowsheet row definition   Flowsheet Row Counselor from 11/15/2023 in [facility] from 11/24/2022 in [facility] at [facility] from 10/05/2022 in [facility] at [facility] RISK CATEGORY Moderate Risk Low Risk Low Risk    Suicidal/Homicidal: None, No plan to harm self or others  Therapist Response:  Clinician openly greeted patient upon joining visit, assessing presenting mood and affect, engaging in introductory check-in. Utilized open ended questions in exploring pt's recounts of events of the past week, further supporting pt in exploring identified challenges. Utilized active listening in providing support of pt's recounts of challenges of the past week, further validating thoughts and expressed feelings, providing support and empathy for pt in ongoing difficulties. Utilized supportive reflection, CBT strategies, grief-specific interventions, and psychoeducation to assist patient in processing mood variability, grief triggers, sleep disruption, interpersonal stressors, and  values-aligned future planning.  [x]  Cognitive Challenging  [x]  Cognitive Refocusing  [x]  Cognitive Reframing  []  Communication Skills []  Compliance Issues  []  DBT  [x]  Exploration of Coping Patterns  [x]  Exploration of Emotions [x]  Exploration of Relationship Patterns  []  Guided Imagery  []  Interactive Feedback  []  Interpersonal Resolutions []  Mindfulness Training  []  Preventative Services  [x]  Psycho-Education  []  Relaxation/Deep Breathing []  Review of Treatment Plan/Progress  []  Role-Play/Behavioral Rehearsal  [x]  Structured Problem Solving  [x]  Supportive Reflection [x]  Symptom Management  []  Other  Patient responded well to interventions. Patient continues to meet criteria for MDD and GAD. Patient will continue to benefit from engagement in outpatient therapy due to being the least restrictive service to meet presenting needs. Pt proves to maintain progress towards goals.  Homework: None.  Plan: Return again in 1 weeks.  Diagnosis:  Encounter Diagnoses  Name Primary?   Major depressive disorder, recurrent episode, mild Yes   Grief    GAD (generalized anxiety disorder)    Adjustment insomnia        Collaboration of Care: Psychiatrist AEB provider documentation available in EHR.  Patient/Guardian was advised Release of Information must be obtained prior to any record release in order to collaborate their care with an outside provider. Patient/Guardian was advised if they have not already done so to contact the registration department to sign all necessary forms in order for us  to release information regarding their care.   Consent: Patient/Guardian gives verbal consent for treatment and assignment of benefits for services provided during this visit. Patient/Guardian expressed understanding and agreed to proceed.   Virtual Visit via Video Note  I connected with Matthew Hutchinson on 06/12/24 at  4:00 PM EST by a video enabled telemedicine application and verified that I am  speaking with the correct person using two identifiers.  Location: Patient: Home Provider: Home Office   I discussed the limitations of evaluation and management by telemedicine and the availability of in person appointments. The patient expressed understanding and agreed to proceed.   The patient was advised to call back or seek an in-person evaluation if the symptoms worsen or if the condition fails to improve as anticipated.  I provided 72 minutes of non-face-to-face time during this encounter.  Lynwood JONETTA Maris, MSW, LCSW 06/12/2024,  4:10 PM

## 2024-06-19 ENCOUNTER — Ambulatory Visit (INDEPENDENT_AMBULATORY_CARE_PROVIDER_SITE_OTHER): Admitting: Licensed Clinical Social Worker

## 2024-06-19 DIAGNOSIS — F330 Major depressive disorder, recurrent, mild: Secondary | ICD-10-CM

## 2024-06-19 DIAGNOSIS — F4321 Adjustment disorder with depressed mood: Secondary | ICD-10-CM

## 2024-06-19 DIAGNOSIS — F411 Generalized anxiety disorder: Secondary | ICD-10-CM

## 2024-06-19 NOTE — Progress Notes (Unsigned)
 THERAPIST PROGRESS NOTE   Session Date: 06/19/2024  Session Time: 1510 - 1610  Participation Level: Active  MSE/Presentation: Behavior: Appropriate and Sharing Speech: Normal Thought Process: Coherent and Relevant Cognition: Alert and Appropriate Mood: Depressed and Euthymic Affect: Congruent Insight: Good Appearance: Casual  Type of Therapy: Individual Therapy  Treatment Goals addressed:   Progressing (6)  LTG: Increase coping skills to manage depression and improve ability to perform daily activities (OP Depression) STG: Reduce overall depression score by a minimum of 25% on the Patient Health Questionnaire (PHQ-9) (OP Depression) STG: Matthew Hutchinson will reduce frequency of avoidant behaviors by 50% as evidenced by self-report in therapy sessions (Anxiety) LTG: Continue to keep depression manageable when faced with the inevitable horribleness (OP Depression) LTG: Maintain abilities at managing anxiety and have minimal panic attacks (Anxiety)  Not Progressing (2) LTG: Reduce frequency, intensity, and duration of depression symptoms so that daily functioning is improved (OP Depression) STG: Matthew Hutchinson will identify cognitive patterns and beliefs that support depression (OP Depression)  Progress Towards Goals: Progressing  Interventions: CBT, Motivational Interviewing, Solution Focused, and Supportive  Summary: Matthew Hutchinson is a 63 y.o. male with psych history of MDD and GAD, presenting for follow-up therapy session in efforts to improve management of depressive and anxious symptoms.   Patient was actively engaged in session, presenting overall pleasant demeanor and congruent affect. 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  Shared of having had an increasingly busy weekend tending to various accounts, memberships, and enrollments of late spouse, sharing of feeling to be accomplishing things, noting of  continued need to dispose of late spouses insulin  and other medications that pt proves unable to get rid of due to pharmacies and community organizations.  Shared of having gone down [employer] rabit hole for about 4hr, ***   Shared of feeling exciting, sad, boohooing, I got the last christmas gift that Matthew Hutchinson is ever going to get me, sharing of having not known about the item, having talked about wanting one for 4-5+ years, having received [employer] home hydroponic grow system to allow to grow fruits, vegetables, ***         05/30/2024    3:34 PM 03/13/2024    2:35 PM 12/13/2023    5:02 PM 11/30/2023    3:14 PM  GAD 7 : Generalized Anxiety Score  Nervous, Anxious, on Edge 2 2 2 3   Control/stop worrying 3 3 2 3   Worry too much - different things 1 3 1 3   Trouble relaxing 1 2 1 3   Restless 1 3 1 3   Easily annoyed or irritable 2 3 2 3   Afraid - awful might happen 2 3 1 3   Total GAD 7 Score 12 19 10 21   Anxiety Difficulty Very difficult Extremely difficult Very difficult Very difficult      05/30/2024    3:33 PM 03/13/2024    2:38 PM 12/13/2023    5:03 PM 11/30/2023    3:13 PM 11/15/2023    3:55 PM  Depression screen PHQ 2/9  Decreased Interest 1 3 1 1  0  Down, Depressed, Hopeless 1 2 1 3 3   PHQ - 2 Score 2 5 2 4 3   Altered sleeping 3 3 0 1 3  Tired, decreased energy 2 2 0 2 3  Change in appetite 2 3 0  3  Feeling bad or failure about yourself  1 3 1 3 3   Trouble concentrating 1 3 1  3 3  Moving slowly or fidgety/restless 3 3 0 2 3  Suicidal thoughts 0 0 0 0 3  PHQ-9 Score 14 22  4  15  24    Difficult doing work/chores Somewhat difficult Extremely dIfficult Somewhat difficult Very difficult Extremely dIfficult     Data saved with a previous flowsheet row definition   Flowsheet Row Counselor from 11/15/2023 in [facility] at [facility] from 11/24/2022 in [facility] at [facility] from 10/05/2022 in [facility]  Outpatient Behavioral Health at [facility] RISK CATEGORY Moderate Risk Low Risk Low Risk    Suicidal/Homicidal: None, No plan to harm self or others  Therapist Response:  Clinician *** openly greeted patient upon joining visit, assessing presenting mood and affect, engaging in introductory check-in. Utilized open ended questions in exploring pt's recounts of events of the past week, further supporting pt in exploring identified challenges. Utilized active listening in providing support of pt's recounts of challenges of the past week, further validating thoughts and expressed feelings, providing support and empathy for pt in ongoing difficulties. Utilized supportive reflection, CBT strategies, grief-specific interventions, and psychoeducation to assist patient in processing mood variability, grief triggers, sleep disruption, interpersonal stressors, and values-aligned future planning.  [x]  Cognitive Challenging  [x]  Cognitive Refocusing  [x]  Cognitive Reframing  []  Communication Skills []  Compliance Issues  []  DBT  [x]  Exploration of Coping Patterns  [x]  Exploration of Emotions [x]  Exploration of Relationship Patterns  []  Guided Imagery  []  Interactive Feedback  []  Interpersonal Resolutions []  Mindfulness Training  []  Preventative Services  [x]  Psycho-Education  []  Relaxation/Deep Breathing []  Review of Treatment Plan/Progress  []  Role-Play/Behavioral Rehearsal  [x]  Structured Problem Solving  [x]  Supportive Reflection [x]  Symptom Management  []  Other  Patient responded well to interventions. Patient continues to meet criteria for MDD and GAD. Patient will continue to benefit from engagement in outpatient therapy due to being the least restrictive service to meet presenting needs. Pt proves to maintain progress towards goals.  Homework: None.  Plan: Return again in 1 weeks.  Diagnosis:  No diagnosis found.      Collaboration of Care: Psychiatrist AEB provider documentation  available in EHR.  Patient/Guardian was advised Release of Information must be obtained prior to any record release in order to collaborate their care with an outside provider. Patient/Guardian was advised if they have not already done so to contact the registration department to sign all necessary forms in order for us  to release information regarding their care.   Consent: Patient/Guardian gives verbal consent for treatment and assignment of benefits for services provided during this visit. Patient/Guardian expressed understanding and agreed to proceed.   Virtual Visit via Video Note  I connected with Matthew Hutchinson on 06/19/24 at  3:00 PM EST by a video enabled telemedicine application and verified that I am speaking with the correct person using two identifiers.  Location: Patient: Home Provider: Home Office   I discussed the limitations of evaluation and management by telemedicine and the availability of in person appointments. The patient expressed understanding and agreed to proceed.   The patient was advised to call back or seek an in-person evaluation if the symptoms worsen or if the condition fails to improve as anticipated.  I provided 60 minutes of non-face-to-face time during this encounter.  Matthew Hutchinson, MSW, LCSW 06/19/2024,  3:11 PM

## 2024-06-22 ENCOUNTER — Telehealth (HOSPITAL_COMMUNITY): Admitting: Psychiatry

## 2024-06-26 ENCOUNTER — Encounter: Payer: Self-pay | Admitting: Family Medicine

## 2024-06-27 ENCOUNTER — Ambulatory Visit (INDEPENDENT_AMBULATORY_CARE_PROVIDER_SITE_OTHER): Admitting: Licensed Clinical Social Worker

## 2024-06-27 DIAGNOSIS — F330 Major depressive disorder, recurrent, mild: Secondary | ICD-10-CM | POA: Diagnosis not present

## 2024-06-27 DIAGNOSIS — F4321 Adjustment disorder with depressed mood: Secondary | ICD-10-CM | POA: Diagnosis not present

## 2024-06-27 DIAGNOSIS — F411 Generalized anxiety disorder: Secondary | ICD-10-CM

## 2024-06-27 NOTE — Progress Notes (Unsigned)
 *-THERAPIST PROGRESS NOTE   Session Date: 06/27/2024  Session Time: 1510 - 1612  Participation Level: Active  MSE/Presentation: Behavior: Appropriate and Sharing Speech: Normal Thought Process: Coherent and Relevant Cognition: Alert and Appropriate Mood: Depressed and Euthymic Affect: Congruent Insight: Good Appearance: Casual  Type of Therapy: Individual Therapy  Treatment Goals addressed:   Progressing (6)  LTG: Increase coping skills to manage depression and improve ability to perform daily activities (OP Depression) STG: Reduce overall depression score by a minimum of 25% on the Patient Health Questionnaire (PHQ-9) (OP Depression) STG: Matthew Hutchinson will reduce frequency of avoidant behaviors by 50% as evidenced by self-report in therapy sessions (Anxiety) LTG: Continue to keep depression manageable when faced with the inevitable horribleness (OP Depression) LTG: Maintain abilities at managing anxiety and have minimal panic attacks (Anxiety)  Not Progressing (2) LTG: Reduce frequency, intensity, and duration of depression symptoms so that daily functioning is improved (OP Depression) STG: Matthew Hutchinson will identify cognitive patterns and beliefs that support depression (OP Depression)  Progress Towards Goals: Progressing  Interventions: CBT, Motivational Interviewing, Solution Focused, and Supportive  Summary: Matthew Hutchinson is a 63 y.o. male with psych history of MDD and GAD, presenting for follow-up therapy session in efforts to improve management of depressive and anxious symptoms.   Patient was actively engaged in session, presenting with an overall pleasant demeanor and congruent affect.  During check-in, patient reported of doing meh, sharing of yesterday having been absolutely horrible, sharing of having gotten connected with prior psychotherapist with whom pt and spouse had previously seen individually approx 6+ years ago, whom is also a optician, dispensing and the one who  facilitated wedding ceremony of pt and spouse, having reached out via text and receiving a call from him approx a hr later, having engaged in recounts of events of the past year regarding the challenges surrounding late spouses declining health and ultimate passing. Pt shared of having received supportive feedback from prior psychotherapist.   Processed relationship between love and grief and how they can not exist without one another, processing expectation to be able to treat grief as a box to check off, processing the human need for love, and thus the unrealistic expectation to experience such without grief.  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 Patient described having an increasingly busy weekend managing various accounts, memberships, and enrollments belonging to his late spouse. He expressed feeling productive and accomplishing things, while noting ongoing difficulty disposing of spouses insulin  and other medications due to restrictions from pharmacies and community organizations.  Patient shared spending approximately four hours down an [employer] rabbit hole, revisiting items from both his and his spouses wishlists accumulated over the years. He experienced a mix of joy, sadness, and heartache as memories surfaced of where they were in life when those items were added, reflecting on the impact of those moments on their relationship. Explored expectations surrounding grief, including patients tendency to approach tasks as items to complete, and his growing awareness that grief cannot be compartmentalized or treated as a checklist. Processed anticipated emotional triggers in the future when engaging in activities he and spouse once shared.  Patient described feeling excited, sad, and tearful upon receiving the final Christmas gift his  spouse had purchased--a [employer] home hydroponic grow system patient had talked about wanting with spouse for 4-5+ years. Patient identified comfort  in feeling spouses continued presence through the gift.     05/30/2024    3:34 PM 03/13/2024    2:35 PM 12/13/2023    5:02 PM 11/30/2023    3:14 PM  GAD 7 : Generalized Anxiety Score  Nervous, Anxious, on Edge 2 2 2 3   Control/stop worrying 3 3 2 3   Worry too much - different things 1 3 1 3   Trouble relaxing 1 2 1 3   Restless 1 3 1 3   Easily annoyed or irritable 2 3 2 3   Afraid - awful might happen 2 3 1 3   Total GAD 7 Score 12 19 10 21   Anxiety Difficulty Very difficult Extremely difficult Very difficult Very difficult      05/30/2024    3:33 PM 03/13/2024    2:38 PM 12/13/2023    5:03 PM 11/30/2023    3:13 PM 11/15/2023    3:55 PM  Depression screen PHQ 2/9  Decreased Interest 1 3 1 1  0  Down, Depressed, Hopeless 1 2 1 3 3   PHQ - 2 Score 2 5 2 4 3   Altered sleeping 3 3 0 1 3  Tired, decreased energy 2 2 0 2 3  Change in appetite 2 3 0  3  Feeling bad or failure about yourself  1 3 1 3 3   Trouble concentrating 1 3 1 3 3   Moving slowly or fidgety/restless 3 3 0 2 3  Suicidal thoughts 0 0 0 0 3  PHQ-9 Score 14 22  4  15  24    Difficult doing work/chores Somewhat difficult Extremely dIfficult Somewhat difficult Very difficult Extremely dIfficult     Data saved with a previous flowsheet row definition   Flowsheet Row Counselor from 11/15/2023 in [facility] at [facility] from 11/24/2022 in [facility] Outpatient Behavioral Health at [facility] from 10/05/2022 in [facility] Outpatient Behavioral Health at [facility] RISK CATEGORY Moderate Risk Low Risk Low Risk    Suicidal/Homicidal: None, No plan to harm self or others  Therapist Response:  Clinician *** openly greeted patient upon joining visit, assessing presenting mood and affect, engaging in introductory check-in. Utilized open  ended questions in exploring pt's recounts of events of the past week, further supporting pt in exploring identified challenges. Utilized active listening in providing support of pt's recounts of challenges of the past week, further validating thoughts and expressed feelings, providing support and empathy for pt in ongoing difficulties. Clinician utilized Supportive reflections, CBT-informed grief processing, normalization of grief responses, exploration of emotional triggers, open-ended questions, and active listening.  [x]  Cognitive Challenging  [x]  Cognitive Refocusing  [x]  Cognitive Reframing  []  Communication Skills []  Compliance Issues  []  DBT  [x]  Exploration of Coping Patterns  [x]  Exploration of Emotions [x]  Exploration of Relationship Patterns  []  Guided Imagery  []  Interactive Feedback  []  Interpersonal Resolutions []  Mindfulness Training  []  Preventative Services  [x]  Psycho-Education  []  Relaxation/Deep Breathing []  Review of Treatment Plan/Progress  []  Role-Play/Behavioral Rehearsal  [x]  Structured Problem Solving  [x]  Supportive Reflection [x]  Symptom Management  []  Other  Patient responded well to interventions. Patient continues to meet criteria for MDD and GAD. Patient will continue to benefit from engagement in outpatient therapy due to being the least restrictive service to meet presenting needs. Pt proves to maintain progress towards goals.  Homework: None.  Plan: Return again in 1 weeks.  Diagnosis:  Encounter Diagnoses  Name Primary?   Major depressive disorder, recurrent episode,  mild Yes   Grief    GAD (generalized anxiety disorder)      Collaboration of Care: Psychiatrist AEB provider documentation available in EHR.  Patient/Guardian was advised Release of Information must be obtained prior to any record release in order to collaborate their care with an outside provider. Patient/Guardian was advised if they have not already done so to contact the registration  department to sign all necessary forms in order for us  to release information regarding their care.   Consent: Patient/Guardian gives verbal consent for treatment and assignment of benefits for services provided during this visit. Patient/Guardian expressed understanding and agreed to proceed.   Virtual Visit via Video Note  I connected with Wasim Hurlbut Benton-Elliot on 06/27/2024 at  3:00 PM EST by a video enabled telemedicine application and verified that I am speaking with the correct person using two identifiers.  Location: Patient: Home Provider: Home Office   I discussed the limitations of evaluation and management by telemedicine and the availability of in person appointments. The patient expressed understanding and agreed to proceed.   The patient was advised to call back or seek an in-person evaluation if the symptoms worsen or if the condition fails to improve as anticipated.  I provided 61 minutes of non-face-to-face time during this encounter.  Lynwood JONETTA Maris, MSW, LCSW 06/27/2024,  4:05 PM

## 2024-07-02 ENCOUNTER — Ambulatory Visit (HOSPITAL_COMMUNITY): Admitting: Licensed Clinical Social Worker

## 2024-07-02 DIAGNOSIS — F330 Major depressive disorder, recurrent, mild: Secondary | ICD-10-CM

## 2024-07-02 DIAGNOSIS — F411 Generalized anxiety disorder: Secondary | ICD-10-CM

## 2024-07-02 DIAGNOSIS — F4321 Adjustment disorder with depressed mood: Secondary | ICD-10-CM

## 2024-07-02 NOTE — Progress Notes (Signed)
 *-THERAPIST PROGRESS NOTE   Session Date: 07/02/2024  Session Time: 1508 - 1606  Participation Level: Active  MSE/Presentation: Behavior: Appropriate and Sharing Speech: Normal Thought Process: Coherent and Relevant Cognition: Alert and Appropriate Mood: Depressed and Euthymic Affect: Congruent Insight: Good Appearance: Casual  Type of Therapy: Individual Therapy  Treatment Goals addressed:   Progressing (6)  LTG: Increase coping skills to manage depression and improve ability to perform daily activities (OP Depression) STG: Reduce overall depression score by a minimum of 25% on the Patient Health Questionnaire (PHQ-9) (OP Depression) STG: Talyn will reduce frequency of avoidant behaviors by 50% as evidenced by self-report in therapy sessions (Anxiety) LTG: Continue to keep depression manageable when faced with the inevitable horribleness (OP Depression) LTG: Maintain abilities at managing anxiety and have minimal panic attacks (Anxiety)  Not Progressing (2) LTG: Reduce frequency, intensity, and duration of depression symptoms so that daily functioning is improved (OP Depression) STG: Macguire will identify cognitive patterns and beliefs that support depression (OP Depression)  Progress Towards Goals: Progressing  Interventions: CBT, Motivational Interviewing, Solution Focused, and Supportive  Summary: Matthew Hutchinson is a 63 y.o. male with psych history of MDD and GAD, presenting for follow-up therapy session in efforts to improve management of depressive and anxious symptoms.   Patient was actively engaged in session, presenting with an overall pleasant moods and congruent affect.  During check-in, patient reported of doing okay, further detailing of having an overall uneventful weekend, sharing of having been tending to needs related to business finances and spending increased time navigating insurance needs specific to workers comp insurance coverage due to  prior company having dropped pt's business due to a submitted claim over the past year. 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  Pt shared of plans to visit niece on Wednesday for Christmas eve, and going to [place] on Christmas to join brother and support each other considering both their recent losses, processing previously expressed uncertainty surrounding holiday celebrations due to late spouse no longer accompanying pt to family events, however noting of brother having requested that pt join him for holiday's in order to enjoy each others company and support each other.     05/30/2024    3:34 PM 03/13/2024    2:35 PM 12/13/2023    5:02 PM 11/30/2023    3:14 PM  GAD 7 : Generalized Anxiety Score  Nervous, Anxious, on Edge 2 2 2 3   Control/stop worrying 3 3 2 3   Worry too much - different things 1 3 1 3   Trouble relaxing 1 2 1 3   Restless 1 3 1 3   Easily annoyed or irritable 2 3 2 3   Afraid - awful might happen 2 3 1 3   Total GAD 7 Score 12 19 10 21   Anxiety Difficulty Very difficult Extremely difficult Very difficult Very difficult      05/30/2024    3:33 PM 03/13/2024    2:38 PM 12/13/2023    5:03 PM 11/30/2023  3:13 PM 11/15/2023    3:55 PM  Depression screen PHQ 2/9  Decreased Interest 1 3 1 1  0  Down, Depressed, Hopeless 1 2 1 3 3   PHQ - 2 Score 2 5 2 4 3   Altered sleeping 3 3 0 1 3  Tired,  decreased energy 2 2 0 2 3  Change in appetite 2 3 0  3  Feeling bad or failure about yourself  1 3 1 3 3   Trouble concentrating 1 3 1 3 3   Moving slowly or fidgety/restless 3 3 0 2 3  Suicidal thoughts 0 0 0 0 3  PHQ-9 Score 14 22  4  15  24    Difficult doing work/chores Somewhat difficult Extremely dIfficult Somewhat difficult Very difficult Extremely dIfficult     Data saved with a previous flowsheet row definition   Flowsheet Row Counselor from 11/15/2023 in [facility] from 11/24/2022 in [facility] at [facility] from 10/05/2022 in [facility] Outpatient Behavioral Health at [facility] RISK CATEGORY Moderate Risk Low Risk Low Risk    Suicidal/Homicidal: None, No plan to harm self or others  Therapist Response:  Clinician openly greeted patient upon joining visit, assessing presenting mood and affect, engaging in introductory check-in. Utilized open ended questions in exploring pt's recounts of events of the past week, utilizing active listening in providing support of pt's recounts of the past week. Clinician further utilized Supportive reflections, CBT-informed grief processing, normalization of grief responses, open-ended questions, and active listening.  [x]  Cognitive Challenging  []  Cognitive Refocusing  [x]  Cognitive Reframing  []  Communication Skills []  Compliance Issues  []  DBT  [x]  Exploration of Coping Patterns  [x]  Exploration of Emotions [x]  Exploration of Relationship Patterns  []  Guided Imagery  []  Interactive Feedback  []  Interpersonal Resolutions []  Mindfulness Training  []  Preventative Services  [x]  Psycho-Education  []  Relaxation/Deep Breathing []  Review of Treatment Plan/Progress  []  Role-Play/Behavioral Rehearsal  [x]  Structured Problem Solving  [x]  Supportive Reflection [x]  Symptom Management  []  Other  Patient responded well to interventions. Patient continues to meet  criteria for MDD and GAD. Patient will continue to benefit from engagement in outpatient therapy due to being the least restrictive service to meet presenting needs. Pt proves to maintain progress towards goals.  Homework: None.  Plan: Return again in 1 weeks.  Diagnosis:  Encounter Diagnoses  Name Primary?   Major depressive disorder, recurrent episode, mild Yes   Grief    GAD (generalized anxiety disorder)    Collaboration of Care: Psychiatrist AEB provider documentation available in EHR.  Patient/Guardian was advised Release of Information must be obtained prior to any record release in order to collaborate their care with an outside provider. Patient/Guardian was advised if they have not already done so to contact the registration department to sign all necessary forms in order for us  to release information regarding their care.   Consent: Patient/Guardian gives verbal consent for treatment and assignment of benefits for services provided during this visit. Patient/Guardian expressed understanding and agreed to proceed.   Virtual Visit via Video Note  I connected with Matthew Hutchinson on 07/02/2024 at  3:00 PM EST by a video enabled telemedicine application and verified that I am speaking with the correct person using two identifiers.  Location: Patient: Home Provider: Home Office   I discussed the limitations of evaluation and management by telemedicine and the availability of in person appointments. The patient expressed understanding  and agreed to proceed.   The patient was advised to call back or seek an in-person evaluation if the symptoms worsen or if the condition fails to improve as anticipated.  I provided 58 minutes of non-face-to-face time during this encounter.  Lynwood JONETTA Maris, MSW, LCSW 07/02/2024,  3:10 PM

## 2024-07-10 ENCOUNTER — Ambulatory Visit (INDEPENDENT_AMBULATORY_CARE_PROVIDER_SITE_OTHER): Admitting: Licensed Clinical Social Worker

## 2024-07-10 DIAGNOSIS — F411 Generalized anxiety disorder: Secondary | ICD-10-CM | POA: Diagnosis not present

## 2024-07-10 DIAGNOSIS — F4321 Adjustment disorder with depressed mood: Secondary | ICD-10-CM | POA: Diagnosis not present

## 2024-07-10 DIAGNOSIS — F330 Major depressive disorder, recurrent, mild: Secondary | ICD-10-CM

## 2024-07-10 NOTE — Progress Notes (Signed)
 *-THERAPIST PROGRESS NOTE   Session Date: 07/10/2024  Session Time: 1608 - 1719  Participation Level: Active  MSE/Presentation: Behavior: Appropriate and Sharing Speech: Normal Thought Process: Coherent and Relevant Cognition: Alert and Appropriate Mood: Depressed and Euthymic Affect: Congruent Insight: Good Appearance: Casual  Type of Therapy: Individual Therapy  Treatment Goals addressed:   Progressing (6)  LTG: Increase coping skills to manage depression and improve ability to perform daily activities (OP Depression) STG: Reduce overall depression score by a minimum of 25% on the Patient Health Questionnaire (PHQ-9) (OP Depression) STG: Matthew Hutchinson will reduce frequency of avoidant behaviors by 50% as evidenced by self-report in therapy sessions (Anxiety) LTG: Continue to keep depression manageable when faced with the inevitable horribleness (OP Depression) LTG: Maintain abilities at managing anxiety and have minimal panic attacks (Anxiety)  Not Progressing (2) LTG: Reduce frequency, intensity, and duration of depression symptoms so that daily functioning is improved (OP Depression) STG: Matthew Hutchinson will identify cognitive patterns and beliefs that support depression (OP Depression)  Progress Towards Goals: Progressing  Interventions: CBT, Motivational Interviewing, Solution Focused, and Supportive  Summary: Matthew Hutchinson is a 63 y.o. male with psych history of MDD and GAD, presenting for follow-up therapy session in efforts to improve management of depressive and anxious symptoms.   Patient actively engaged in session, presenting with an overall pleasant moods and congruent affect.  During check-in, patient reported of doing okay, further detailing of having a busy week with the approaching end of year presenting finalization of business needs,  Further shared of having gone to nieces on Christmas eve as well as traveling to [place] to spend Christmas day with  brother that requested pt to join him for Christmas day in order to support each other in navigating their first Christmas holiday's without their respective spouses. Pt shared of having had a considerable amount of time to think while on travels from home and to/from [place] and [place], additionally sharing of having experienced periods of intense sadness and needing to pull over during periods of crying, identifying triggers surrounding experienced thoughts in relation to being alone for the trip and recalling numerous instances in which he has traveled the same roads with late spouse.   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     05/30/2024    3:34 PM 03/13/2024    2:35 PM 12/13/2023    5:02 PM 11/30/2023    3:14 PM  GAD 7 : Generalized Anxiety Score  Nervous, Anxious, on Edge 2 2 2 3   Control/stop worrying 3 3 2 3   Worry too much - different things 1 3 1 3   Trouble relaxing 1 2 1 3   Restless 1 3 1 3   Easily annoyed or irritable 2 3 2 3   Afraid - awful might happen 2 3 1 3   Total GAD 7 Score 12 19 10 21   Anxiety Difficulty Very difficult Extremely difficult Very difficult Very difficult      05/30/2024    3:33 PM 03/13/2024    2:38 PM 12/13/2023    5:03 PM 11/30/2023    3:13 PM 11/15/2023    3:55 PM  Depression screen PHQ 2/9  Decreased Interest 1 3 1 1  0  Down, Depressed, Hopeless 1 2 1 3 3   PHQ - 2 Score 2 5 2 4 3   Altered sleeping 3 3 0 1 3  Tired, decreased  energy 2 2 0 2 3  Change in appetite 2 3 0  3  Feeling bad or failure about yourself  1 3 1 3 3   Trouble concentrating 1 3 1 3 3   Moving slowly or fidgety/restless 3 3 0 2 3  Suicidal thoughts 0 0 0 0 3  PHQ-9 Score 14 22  4  15  24    Difficult doing work/chores Somewhat difficult Extremely dIfficult Somewhat difficult Very difficult Extremely dIfficult     Data saved  with a previous flowsheet row definition   Flowsheet Row Counselor from 11/15/2023 in [facility] at [facility] from 11/24/2022 in [facility] at [facility] from 10/05/2022 in [facility] at [facility] RISK CATEGORY Moderate Risk Low Risk Low Risk    Suicidal/Homicidal: None, No plan to harm self or others  Therapist Response:  Clinician openly greeted patient upon joining visit, assessing presenting mood and affect, engaging in introductory check-in. Utilized open ended questions in eliciting pt's recounts of events of the past week, utilizing active listening in providing support of pt's recounts of the past week. Clinician further utilized Supportive reflections, CBT, MI, supportive reflections in navigating stress.  [x]  Cognitive Challenging  []  Cognitive Refocusing  [x]  Cognitive Reframing  []  Communication Skills []  Compliance Issues  []  DBT  []  Exploration of Coping Patterns  [x]  Exploration of Emotions [x]  Exploration of Relationship Patterns  []  Guided Imagery  []  Interactive Feedback  []  Interpersonal Resolutions []  Mindfulness Training  []  Preventative Services  [x]  Psycho-Education  []  Relaxation/Deep Breathing []  Review of Treatment Plan/Progress  []  Role-Play/Behavioral Rehearsal  []  Structured Problem Solving  [x]  Supportive Reflection []  Symptom Management  []  Other  Patient responded well to interventions. Patient continues to meet criteria for MDD and GAD. Patient will continue to benefit from engagement in outpatient therapy due to being the least restrictive service to meet presenting needs. Pt proves to maintain progress towards goals.  Homework: None.  Plan: Return again in 1 weeks.  Diagnosis:  Encounter Diagnoses  Name Primary?   Grief Yes   Major depressive disorder, recurrent episode, mild    GAD (generalized anxiety disorder)    Collaboration of  Care: Psychiatrist AEB provider documentation available in EHR.  Patient/Guardian was advised Release of Information must be obtained prior to any record release in order to collaborate their care with an outside provider. Patient/Guardian was advised if they have not already done so to contact the registration department to sign all necessary forms in order for us  to release information regarding their care.   Consent: Patient/Guardian gives verbal consent for treatment and assignment of benefits for services provided during this visit. Patient/Guardian expressed understanding and agreed to proceed.   Virtual Visit via Video Note  I connected with Matthew Hutchinson on 07/10/2024 at  4:00 PM EST by a video enabled telemedicine application and verified that I am speaking with the correct person using two identifiers.  Location: Patient: Home Provider: Home Office   I discussed the limitations of evaluation and management by telemedicine and the availability of in person appointments. The patient expressed understanding and agreed to proceed.   The patient was advised to call back or seek an in-person evaluation if the symptoms worsen or if the condition fails to improve as anticipated.  I provided 70 minutes of non-face-to-face time during this encounter.  Lynwood JONETTA Maris, MSW, LCSW 07/10/2024,  4:11 PM

## 2024-07-16 ENCOUNTER — Ambulatory Visit (HOSPITAL_COMMUNITY): Admitting: Licensed Clinical Social Worker

## 2024-07-16 DIAGNOSIS — F411 Generalized anxiety disorder: Secondary | ICD-10-CM

## 2024-07-16 DIAGNOSIS — F4321 Adjustment disorder with depressed mood: Secondary | ICD-10-CM

## 2024-07-16 DIAGNOSIS — F330 Major depressive disorder, recurrent, mild: Secondary | ICD-10-CM | POA: Diagnosis not present

## 2024-07-16 NOTE — Progress Notes (Unsigned)
 *-THERAPIST PROGRESS NOTE   Session Date: 07/16/2024  Session Time: 1608 - 1715  Participation Level: Active  MSE/Presentation: Behavior: Appropriate and Sharing Speech: Normal Thought Process: Coherent and Relevant Cognition: Alert and Appropriate Mood: Depressed and Euthymic Affect: Congruent Insight: Good Appearance: Casual  Type of Therapy: Individual Therapy  Treatment Goals addressed:   Progressing (6)  LTG: Increase coping skills to manage depression and improve ability to perform daily activities (OP Depression) STG: Reduce overall depression score by a minimum of 25% on the Patient Health Questionnaire (PHQ-9) (OP Depression) STG: Matthew Hutchinson will reduce frequency of avoidant behaviors by 50% as evidenced by self-report in therapy sessions (Anxiety) LTG: Continue to keep depression manageable when faced with the inevitable horribleness (OP Depression) LTG: Maintain abilities at managing anxiety and have minimal panic attacks (Anxiety)  Not Progressing (2) LTG: Reduce frequency, intensity, and duration of depression symptoms so that daily functioning is improved (OP Depression) STG: Matthew Hutchinson will identify cognitive patterns and beliefs that support depression (OP Depression)  Progress Towards Goals: Progressing  Interventions: CBT, Motivational Interviewing, Solution Focused, and Supportive  Summary: Matthew Hutchinson is a 64 y.o. male with psych history of MDD and GAD, presenting for follow-up therapy session in efforts to improve management of depressive and anxious symptoms.   Patient actively engaged in session, presenting with an overall depressed moods and congruent affect.  Pt openly engaged in check-in, sharing of doing mehh, further detailing of ongoing stress related to work and [employer] business factors and annual business insurance needs.  Patient actively engaged in session, presenting with depressed mood and congruent affect. Pt reported continued  difficulty with trauma-linked memories that reliably surface between Wednesdays and Thursdays, specifically recalling late spouses panic attack in the days leading to his passing. Pt expressed persistent guilt, second-guessing, and questioning surrounding events, noting ongoing emotional struggle with fully accepting spouses death. Pt described automatic tendencies to anticipate spouses familiar phrases, gestures, and relational patterns.  Pt engaged in extensive processing of the impact spouse had on his life over the past 30 years--recognizing they spent nearly half of pts life together. Explored factors that strengthened and sustained the relationship, including shared values, emotional attunement, and the sense of being as one through deeply developed mutual understanding. Further processed how long-term relationships shape ones identity, beliefs, worldview, and sense of self, and how the loss disrupts these developed structures.     05/30/2024    3:34 PM 03/13/2024    2:35 PM 12/13/2023    5:02 PM 11/30/2023    3:14 PM  GAD 7 : Generalized Anxiety Score  Nervous, Anxious, on Edge 2 2 2 3   Control/stop worrying 3 3 2 3   Worry too much - different things 1 3 1 3   Trouble relaxing 1 2 1 3   Restless 1 3 1 3   Easily annoyed or irritable 2 3 2 3   Afraid - awful might happen 2 3 1 3   Total GAD 7 Score 12 19 10 21   Anxiety Difficulty Very difficult Extremely difficult Very difficult Very difficult      05/30/2024    3:33 PM 03/13/2024    2:38 PM 12/13/2023    5:03 PM 11/30/2023    3:13 PM 11/15/2023    3:55 PM  Depression screen PHQ 2/9  Decreased Interest 1 3 1 1  0  Down, Depressed, Hopeless 1 2 1 3 3   PHQ - 2 Score 2 5 2 4 3   Altered sleeping 3 3 0 1 3  Tired, decreased energy  2 2 0 2 3  Change in appetite 2 3 0  3  Feeling bad or failure about yourself  1 3 1 3 3   Trouble concentrating 1 3 1 3 3   Moving slowly or fidgety/restless 3 3 0 2 3  Suicidal thoughts 0 0 0 0 3  PHQ-9 Score  14 22  4  15  24    Difficult doing work/chores Somewhat difficult Extremely dIfficult Somewhat difficult Very difficult Extremely dIfficult     Data saved with a previous flowsheet row definition   Flowsheet Row Counselor from 11/15/2023 in [facility] from 11/24/2022 in [facility] at [facility] from 10/05/2022 in [facility] at [facility] RISK CATEGORY Moderate Risk Low Risk Low Risk    Suicidal/Homicidal: None, No plan to harm self or others  Therapist Response:  Clinician openly greeted patient upon joining visit, assessing presenting mood and affect, engaging in introductory check-in. Utilized open ended questions in eliciting pt's recounts of events of the past week, utilizing active listening in providing support of pt's recounts of the past week. Utilized CBT to challenge guilt-based and what-if cognitions; used MI to explore pts ambivalence around grief acceptance; provided supportive reflection and normalization of grief-linked temporal triggers; guided narrative processing of relationship history, identity formation, and meaning-making following long-term relational loss. Clinician further utilized Supportive reflections, CBT, MI, supportive reflections in navigating stress.  [x]  Cognitive Challenging  []  Cognitive Refocusing  [x]  Cognitive Reframing  []  Communication Skills []  Compliance Issues  []  DBT  [x]  Exploration of Coping Patterns  [x]  Exploration of Emotions [x]  Exploration of Relationship Patterns  []  Guided Imagery  []  Interactive Feedback  []  Interpersonal Resolutions []  Mindfulness Training  []  Preventative Services  [x]  Psycho-Education  []  Relaxation/Deep Breathing []  Review of Treatment Plan/Progress  []  Role-Play/Behavioral Rehearsal  []  Structured Problem Solving  [x]  Supportive Reflection [x]  Symptom Management  []  Other  Patient  responded well to interventions. Patient continues to meet criteria for MDD and GAD. Patient will continue to benefit from engagement in outpatient therapy due to being the least restrictive service to meet presenting needs. Pt proves to maintain progress towards goals.  Homework: None.  Plan: Return again in 1 weeks.  Diagnosis:  Encounter Diagnoses  Name Primary?   Major depressive disorder, recurrent episode, mild Yes   GAD (generalized anxiety disorder)    Grief     Collaboration of Care: Psychiatrist AEB provider documentation available in EHR.  Patient/Guardian was advised Release of Information must be obtained prior to any record release in order to collaborate their care with an outside provider. Patient/Guardian was advised if they have not already done so to contact the registration department to sign all necessary forms in order for us  to release information regarding their care.   Consent: Patient/Guardian gives verbal consent for treatment and assignment of benefits for services provided during this visit. Patient/Guardian expressed understanding and agreed to proceed.   Virtual Visit via Video Note  I connected with Matthew Hutchinson on 07/16/2024 at  4:00 PM EST by a video enabled telemedicine application and verified that I am speaking with the correct person using two identifiers.  Location: Patient: Home Provider: Home Office   I discussed the limitations of evaluation and management by telemedicine and the availability of in person appointments. The patient expressed understanding and agreed to proceed.   The patient was advised to call back or seek an  in-person evaluation if the symptoms worsen or if the condition fails to improve as anticipated.  I provided 67 minutes of non-face-to-face time during this encounter.  Matthew Hutchinson, MSW, LCSW 07/16/2024,  4:09 PM

## 2024-07-22 ENCOUNTER — Other Ambulatory Visit: Payer: Self-pay | Admitting: Family Medicine

## 2024-07-22 DIAGNOSIS — E162 Hypoglycemia, unspecified: Secondary | ICD-10-CM

## 2024-07-22 DIAGNOSIS — E1165 Type 2 diabetes mellitus with hyperglycemia: Secondary | ICD-10-CM

## 2024-07-24 ENCOUNTER — Ambulatory Visit (HOSPITAL_COMMUNITY): Admitting: Licensed Clinical Social Worker

## 2024-07-24 DIAGNOSIS — F411 Generalized anxiety disorder: Secondary | ICD-10-CM

## 2024-07-24 DIAGNOSIS — F4321 Adjustment disorder with depressed mood: Secondary | ICD-10-CM

## 2024-07-24 DIAGNOSIS — F330 Major depressive disorder, recurrent, mild: Secondary | ICD-10-CM | POA: Diagnosis not present

## 2024-07-24 NOTE — Progress Notes (Unsigned)
 *-THERAPIST PROGRESS NOTE   Session Date: 07/24/2024  Session Time: 1508 - 1614  Participation Level: Active  MSE/Presentation: Behavior: Appropriate and Sharing Speech: Normal Thought Process: Coherent and Relevant Cognition: Alert and Appropriate Mood: Anxious and Euthymic Affect: Congruent Insight: Good Appearance: Casual  Type of Therapy: Individual Therapy  Treatment Goals addressed:   Progressing (8) LTG: Reduce frequency, intensity, and duration of depression symptoms so that daily functioning is improved (OP Depression) LTG: Increase coping skills to manage depression and improve ability to perform daily activities (OP Depression) STG: Reduce overall depression score by a minimum of 25% on the Patient Health Questionnaire (PHQ-9) (OP Depression) STG: Demetries will identify cognitive patterns and beliefs that support depression (OP Depression) STG: Tavio will reduce frequency of avoidant behaviors by 50% as evidenced by self-report in therapy sessions (Anxiety) LTG: Continue to keep depression manageable when faced with the inevitable horribleness (OP Depression) LTG: Maintain abilities at managing anxiety and have minimal panic attacks (Anxiety)  Progress Towards Goals: Progressing  Interventions: CBT, Motivational Interviewing, Solution Focused, and Supportive  Summary: Matthew Hutchinson is a 64 y.o. male with psych history of MDD and GAD, presenting for follow-up therapy session in efforts to improve management of depressive and anxious symptoms.   Patient actively engaged in session, presenting with overall pleasant mood and congruent affect. Pt reported doing better this week than recent months, describing improved emotional regulation and decreased intensity of grief-related distress following late spouses passing. Pt processed events of the past week, noting increased activity and efforts to stay occupied through home projects completed with son and  ongoing business-related tasks.  Pt explored continued stress related to the current political climate and its perceived impact on business functioning, as well as long-standing distress tied to historical laws affecting same-sex marriage. Pt expressed feelings of guilt and rumination surrounding past limitations in healthcare access for spouse and the belief that marital status may have influenced outcomes.  Pt revisited concerns regarding bothersome skin growths and flare-ups of dermatologic conditions, expressing intent to schedule a dermatology appointment for further evaluation. Pt also reflected on increased engagement in hobbies and craft-related activities that had been previously neglected for nearly a decade, noting these as meaningful sources of distraction and fulfillment.  Pt acknowledged the emotional weight of recognizing that January marks the need to begin planning spouses funeral. Pt has connected with friends in [place] for support and discussed uncertainty around eulogy planning, expressing insight into the emotional difficulty and low likelihood of being able to coca cola personally.  Engaged in reassessment of presenting anxious and depressive symptoms via GAD-7 and PHQ-9, and processed overall progress toward treatment goals outlined in the individualized treatment plan.     07/24/2024    4:02 PM 05/30/2024    3:34 PM 03/13/2024    2:35 PM 12/13/2023    5:02 PM  GAD 7 : Generalized Anxiety Score  Nervous, Anxious, on Edge 2 2 2 2   Control/stop worrying 3 3 3 2   Worry too much - different things 3 1 3 1   Trouble relaxing 1 1 2 1   Restless 2 1 3 1   Easily annoyed or irritable 2 2 3 2   Afraid - awful might happen 3 2 3 1   Total GAD 7 Score 16 12 19 10   Anxiety Difficulty Somewhat difficult Very difficult Extremely difficult Very difficult      07/24/2024    4:05 PM 05/30/2024    3:33 PM 03/13/2024    2:38 PM 12/13/2023  5:03 PM 11/30/2023    3:13 PM  Depression  screen PHQ 2/9  Decreased Interest 1 1 3 1 1   Down, Depressed, Hopeless 1 1 2 1 3   PHQ - 2 Score 2 2 5 2 4   Altered sleeping 1 3 3  0 1  Tired, decreased energy 1 2 2  0 2  Change in appetite 3 2 3  0   Feeling bad or failure about yourself  1 1 3 1 3   Trouble concentrating 1 1 3 1 3   Moving slowly or fidgety/restless 1 3 3  0 2  Suicidal thoughts 0 0 0 0 0  PHQ-9 Score 10 14 22  4  15    Difficult doing work/chores Somewhat difficult Somewhat difficult Extremely dIfficult Somewhat difficult Very difficult     Data saved with a previous flowsheet row definition   Flowsheet Row Counselor from 11/15/2023 in [facility] from 11/24/2022 in [facility] at [facility] from 10/05/2022 in [facility] at [facility] RISK CATEGORY Moderate Risk Low Risk Low Risk    Suicidal/Homicidal: None, No plan to harm self or others  Therapist Response:  Clinician openly greeted patient upon joining visit, assessing presenting mood and affect, engaging in introductory check-in. Utilized open ended questions in eliciting pt's recounts of events of the past week, utilizing active listening in providing support of pt's recounts of the past week. Utilized CBT to address grief-related cognitions and reduce guilt-based distortions, utilized MI to reinforce continuation of meaningful activities and task engagement, and provided supportive reflection and psychoeducation on grief processes and anticipatory stress. Therapist validated pts emotional experiences, reinforced adaptive coping efforts, and encouraged continued use of supports and structured activities while navigating grief-related tasks. Patient responded well to interventions. Patient continues to meet criteria for MDD and GAD. Patient will continue to benefit from engagement in outpatient therapy due to being the least restrictive service to  meet presenting needs. Pt proves to maintain progress towards goals.  [x]  Cognitive Challenging  []  Cognitive Refocusing  [x]  Cognitive Reframing  []  Communication Skills []  Compliance Issues  []  DBT  [x]  Exploration of Coping Patterns  [x]  Exploration of Emotions [x]  Exploration of Relationship Patterns  []  Guided Imagery  []  Interactive Feedback  []  Interpersonal Resolutions []  Mindfulness Training  []  Preventative Services  [x]  Psycho-Education  []  Relaxation/Deep Breathing []  Review of Treatment Plan/Progress  []  Role-Play/Behavioral Rehearsal  []  Structured Problem Solving  [x]  Supportive Reflection [x]  Symptom Management  []  Other   Homework: None.  Plan: Return again in 1 weeks.  Diagnosis:  Encounter Diagnoses  Name Primary?   Major depressive disorder, recurrent episode, mild Yes   GAD (generalized anxiety disorder)    Grief     Collaboration of Care: Psychiatrist AEB provider documentation available in EHR.  Patient/Guardian was advised Release of Information must be obtained prior to any record release in order to collaborate their care with an outside provider. Patient/Guardian was advised if they have not already done so to contact the registration department to sign all necessary forms in order for us  to release information regarding their care.   Consent: Patient/Guardian gives verbal consent for treatment and assignment of benefits for services provided during this visit. Patient/Guardian expressed understanding and agreed to proceed.   Virtual Visit via Video Note  I connected with Matthew Hutchinson on 07/24/2024 at  3:00 PM EST by a video enabled telemedicine application and verified that I am  speaking with the correct person using two identifiers.  Location: Patient: Home Provider: Home Office   I discussed the limitations of evaluation and management by telemedicine and the availability of in person appointments. The patient expressed understanding and  agreed to proceed.   The patient was advised to call back or seek an in-person evaluation if the symptoms worsen or if the condition fails to improve as anticipated.  I provided 66 minutes of non-face-to-face time during this encounter.  Lynwood JONETTA Maris, MSW, LCSW 07/24/2024,  3:47 PM

## 2024-07-30 ENCOUNTER — Encounter (HOSPITAL_COMMUNITY): Payer: Self-pay | Admitting: Psychiatry

## 2024-07-30 ENCOUNTER — Telehealth (HOSPITAL_COMMUNITY): Admitting: Psychiatry

## 2024-07-30 VITALS — Wt 292.0 lb

## 2024-07-30 DIAGNOSIS — F4321 Adjustment disorder with depressed mood: Secondary | ICD-10-CM | POA: Diagnosis not present

## 2024-07-30 DIAGNOSIS — F330 Major depressive disorder, recurrent, mild: Secondary | ICD-10-CM | POA: Diagnosis not present

## 2024-07-30 DIAGNOSIS — F5102 Adjustment insomnia: Secondary | ICD-10-CM | POA: Diagnosis not present

## 2024-07-30 DIAGNOSIS — F411 Generalized anxiety disorder: Secondary | ICD-10-CM | POA: Diagnosis not present

## 2024-07-30 MED ORDER — HYDROXYZINE PAMOATE 50 MG PO CAPS: ORAL_CAPSULE | ORAL | 0 refills | Status: AC

## 2024-07-30 MED ORDER — TEMAZEPAM 7.5 MG PO CAPS: 7.5000 mg | ORAL_CAPSULE | Freq: Every evening | ORAL | 0 refills | Status: AC | PRN

## 2024-07-30 MED ORDER — SERTRALINE HCL 100 MG PO TABS: ORAL_TABLET | ORAL | 0 refills | Status: AC

## 2024-07-30 MED ORDER — LAMOTRIGINE 200 MG PO TABS: 200.0000 mg | ORAL_TABLET | Freq: Every day | ORAL | 0 refills | Status: AC

## 2024-07-30 NOTE — Progress Notes (Signed)
 " [facility] MD Virtual Progress Note   Patient Location: Home Provider Location: Office  I connect with patient by video and verified that I am speaking with correct person by using two identifiers. I discussed the limitations of evaluation and management by telemedicine and the availability of in person appointments. I also discussed with the patient that there may be a patient responsible charge related to this service. The patient expressed understanding and agreed to proceed.  Matthew Hutchinson [id number] 64 y.o.  07/30/2024 2:48 PM  History of Present Illness:  Patient is evaluated by video session.  He reported doing so-so.  On Christmas he went to see his niece who lives in [place].  Patient told he was very sad on the highway because he was by himself and usually he had a husband.  He reported going through a lot tough time and is still having a lot of grief.  He is seeing Lynwood and that has been very helpful.  We have provided few capsules of temazepam  which he took 5 of them.  He does not want to be dependent on it.  He reported overall less meltdown but is still having crying spells and anxiety.  He denies any mania, aggression, violence.  His irritability is still there.  He has vivid dreams about his husband.  Those dreams are very disturbing for him.  He is in a process of funeral and now started thinking about it.  He talked to [place] who did the his marriage ceremony and now agreed to do funeral.  Patient still gets very emotional and tearful when he talks about his deceased husband.  He denies any hallucination, suicidal thoughts.  He is trying to focus on his general health.  He cut down his snacking and last hemoglobin A1c 5.6.  He reported Dr. Landy adjusting the dose of Mounjaro .  He is sleeping with CPAP.  He reported energy level is fair.  He denies any hallucination, paranoia, active or passive suicidal thoughts.  He is on Lamictal , Zoloft , hydroxyzine  and  temazepam  as needed.  Denies drinking or using any illegal substances.  Past Psychiatric History: H/O inpatient at [facility] for overdose on Ambien .  H/O overdose on Ambien  2 other times but did not require inpatient treatment.  H/O of mood swing, impulsive behavior, speeding ticket, anger, road rage.  No history of psychosis or hallucination.   Past Medical History:  Diagnosis Date   Allergy    Anxiety    Arthritis    COPD (chronic obstructive pulmonary disease) (HCC)    Depression    Diabetes mellitus without complication (HCC) 2020   Emphysema of lung (HCC)    Hyperlipidemia    Morbid obesity (HCC)    OSA (obstructive sleep apnea)    uses CPAP nightly   PONV (postoperative nausea and vomiting)    Unspecified essential hypertension     Outpatient Encounter Medications as of 07/30/2024  Medication Sig   amLODipine  (NORVASC ) 5 MG tablet Take 1 tablet (5 mg total) by mouth daily.   ARNUITY ELLIPTA  100 MCG/ACT AEPB TAKE 1 PUFF BY MOUTH EVERY DAY   aspirin  81 MG tablet Take 1 tablet (81 mg total) by mouth daily.   Carboxymethylcellul-Glycerin (LUBRICATING EYE DROPS OP) Place 1 drop into both eyes daily as needed (dry eyes).   Continuous Glucose Sensor (FREESTYLE LIBRE 3 SENSOR) MISC Apply as instructed, change every 10 days.   Continuous Glucose Sensor (FREESTYLE LIBRE 3 SENSOR) MISC APPLY AS INSTRUCTED, CHANGE  EVERY 10 DAYS.   Continuous Glucose Sensor (FREESTYLE LIBRE 3 SENSOR) MISC APPLY AS INSTRUCTED, CHANGE EVERY 14 DAYS   gabapentin  (NEURONTIN ) 300 MG capsule TAKE 2 CAPSULES BY MOUTH 2 TIMES DAILY.   hydrOXYzine  (VISTARIL ) 50 MG capsule TAKE 2 CAPSULES BY MOUTH AT BEDTIME   lamoTRIgine  (LAMICTAL ) 200 MG tablet Take 1 tablet (200 mg total) by mouth daily.   losartan -hydrochlorothiazide  (HYZAAR) 100-25 MG tablet Take 1 tablet by mouth daily.   metFORMIN  (GLUCOPHAGE ) 500 MG tablet Take 1 tablet (500 mg total) by mouth 2 (two) times daily with a meal.   nitroGLYCERIN  (NITROSTAT ) 0.4 MG SL  tablet Place 1 tablet (0.4 mg total) under the tongue every 5 (five) minutes as needed for chest pain. If chest pain not resolved, after 3 doses 5 minutes apart--call 911   sertraline  (ZOLOFT ) 100 MG tablet TAKE 1 TABLET BY MOUTH DAILY   simvastatin  (ZOCOR ) 20 MG tablet TAKE 1 TABLET BY MOUTH EVERYDAY AT BEDTIME   temazepam  (RESTORIL ) 7.5 MG capsule Take 1 capsule (7.5 mg total) by mouth at bedtime as needed for sleep.   tirzepatide  (MOUNJARO ) 5 MG/0.5ML Pen INJECT 5 MG SUBCUTANEOUSLY WEEKLY   traMADol  (ULTRAM ) 50 MG tablet Take 50 mg by mouth daily as needed for severe pain.   traZODone  (DESYREL ) 100 MG tablet Take 1 tablet (100 mg total) by mouth at bedtime as needed for sleep.   No facility-administered encounter medications on file as of 07/30/2024.    Recent Results (from the past 2160 hours)  POCT Glucose (CBG)     Status: None   Collection Time: 05/30/24  3:57 PM  Result Value Ref Range   POC Glucose 88 70 - 99 mg/dl  POCT glycosylated hemoglobin (Hb A1C)     Status: None   Collection Time: 05/30/24  4:10 PM  Result Value Ref Range   Hemoglobin A1C 5.6 4.0 - 5.6 %   HbA1c POC (<> result, manual entry)     HbA1c, POC (prediabetic range)     HbA1c, POC (controlled diabetic range)       Psychiatric Specialty Exam: Physical Exam  Review of Systems  Weight 292 lb (132.5 kg).There is no height or weight on file to calculate BMI.  General Appearance: Casual  Eye Contact:  Fair  Speech:  Slow  Volume:  Decreased  Mood:  Dysphoric and emotional  Affect:  Constricted and Depressed  Thought Process:  Goal Directed  Orientation:  Full (Time, Place, and Person)  Thought Content:  Rumination  Suicidal Thoughts:  No  Homicidal Thoughts:  No  Memory:  Immediate;   Good Recent;   Good Remote;   Good  Judgement:  Intact  Insight:  Present  Psychomotor Activity:  Normal  Concentration:  Concentration: Good and Attention Span: Good  Recall:  Good  Fund of Knowledge:  Good   Language:  Good  Akathisia:  No  Handed:  Right  AIMS (if indicated):     Assets:  Communication Skills Desire for Improvement Housing  ADL's:  Intact  Cognition:  WNL  Sleep:  better with CPAP and sometimes Restoril         07/24/2024    4:05 PM 05/30/2024    3:33 PM 03/13/2024    2:38 PM 12/13/2023    5:03 PM 11/30/2023    3:13 PM  Depression screen PHQ 2/9  Decreased Interest 1 1 3 1 1   Down, Depressed, Hopeless 1 1 2 1 3   PHQ - 2 Score 2 2 5  2 4  Altered sleeping 1 3 3  0 1  Tired, decreased energy 1 2 2  0 2  Change in appetite 3 2 3  0   Feeling bad or failure about yourself  1 1 3 1 3   Trouble concentrating 1 1 3 1 3   Moving slowly or fidgety/restless 1 3 3  0 2  Suicidal thoughts 0 0 0 0 0  PHQ-9 Score 10 14 22  4  15    Difficult doing work/chores Somewhat difficult Somewhat difficult Extremely dIfficult Somewhat difficult Very difficult     Data saved with a previous flowsheet row definition    Assessment/Plan: Major depressive disorder, recurrent episode, mild - Plan: sertraline  (ZOLOFT ) 100 MG tablet, hydrOXYzine  (VISTARIL ) 50 MG capsule, lamoTRIgine  (LAMICTAL ) 200 MG tablet  GAD (generalized anxiety disorder) - Plan: sertraline  (ZOLOFT ) 100 MG tablet  Adjustment insomnia - Plan: temazepam  (RESTORIL ) 7.5 MG capsule  Grief   Patient is 64 year old Caucasian widowed man with major depressive disorder, insomnia, generalized anxiety disorder and grief.  I review current medication, blood work results, collect information from other providers.  Last hemoglobin A1c 5.6.  He is on Mounjaro .  He reported blood sugar fluctuates and not sure if he will receive last dose of Mounjaro .  We talk about Zepbound  that is now indicated for apnea and help the weight loss.  Encouraged to talk to his doctor if it is appropriate and switch from Mounjaro  to Zepbound .  Encouraged to see Lynwood.  Is still have vivid dreams which are sometime very disturbing.  Talk about EMDR but patient like to  give more time to current therapist since he is connected very well with him.  Discussed grief with some time complicated and prolonged.  He is on low-dose temazepam  7.5 and he is using only when he cannot sleep.  I explained can use when he does not sleep few days in a row to get reset his sleep cycle.  Patient afraid to get dependent on it.  I explained if not taking as a standing dose he has less likely to get dependent on it.  Will follow-up in 3 months unless patient need a sooner appointment.  I will also forward my note to his PCP.  For now continue temazepam  7.5 mg to take as needed for insomnia, Lamictal  200 mg daily, hydroxyzine  2 capsule 50 mg at bedtime, Lamictal  200 mg daily and Zoloft  100 mg daily.  Patient will continue therapy with Lynwood.  Recommended to call back if is any question or any concern.  Follow Up Instructions:     I discussed the assessment and treatment plan with the patient. The patient was provided an opportunity to ask questions and all were answered. The patient agreed with the plan and demonstrated an understanding of the instructions.   The patient was advised to call back or seek an in-person evaluation if the symptoms worsen or if the condition fails to improve as anticipated.    Collaboration of Care: Other provider involved in patient's care AEB notes are available in epic to review  Patient/Guardian was advised Release of Information must be obtained prior to any record release in order to collaborate their care with an outside provider. Patient/Guardian was advised if they have not already done so to contact the registration department to sign all necessary forms in order for us  to release information regarding their care.   Consent: Patient/Guardian gives verbal consent for treatment and assignment of benefits for services provided during this visit. Patient/Guardian expressed understanding  and agreed to proceed.    I personally spent a total of 31 minutes  in the care of the patient today including preparing to see the patient, getting/reviewing separately obtained history, performing a medically appropriate exam/evaluation, counseling and educating, placing orders, referring and communicating with other health care professionals, documenting clinical information in the EHR, independently interpreting results, communicating results, and coordinating care.   Note: This document was prepared by [employer] voice dictation technology and any errors that results from this process are unintentional.    Leni ONEIDA Client, MD 07/30/2024   "

## 2024-08-01 ENCOUNTER — Ambulatory Visit (HOSPITAL_COMMUNITY): Admitting: Licensed Clinical Social Worker

## 2024-08-01 DIAGNOSIS — F4321 Adjustment disorder with depressed mood: Secondary | ICD-10-CM | POA: Diagnosis not present

## 2024-08-01 DIAGNOSIS — F411 Generalized anxiety disorder: Secondary | ICD-10-CM | POA: Diagnosis not present

## 2024-08-01 DIAGNOSIS — F330 Major depressive disorder, recurrent, mild: Secondary | ICD-10-CM

## 2024-08-01 DIAGNOSIS — F5102 Adjustment insomnia: Secondary | ICD-10-CM

## 2024-08-01 NOTE — Progress Notes (Unsigned)
 *-THERAPIST PROGRESS NOTE   Session Date: 08/01/2024  Session Time: 1515 - 1625  Participation Level: Active  MSE/Presentation: Behavior: Appropriate and Sharing Speech: Normal Thought Process: Coherent and Relevant Cognition: Alert and Appropriate Mood: Anxious and Euthymic Affect: Congruent Insight: Good Appearance: Casual  Type of Therapy: Individual Therapy  Treatment Goals addressed:   Progressing (8) LTG: Reduce frequency, intensity, and duration of depression symptoms so that daily functioning is improved (OP Depression) LTG: Increase coping skills to manage depression and improve ability to perform daily activities (OP Depression) STG: Reduce overall depression score by a minimum of 25% on the Patient Health Questionnaire (PHQ-9) (OP Depression) STG: [name] will identify cognitive patterns and beliefs that support depression (OP Depression) STG: [name] will reduce frequency of avoidant behaviors by 50% as evidenced by self-report in therapy sessions (Anxiety) LTG: Continue to keep depression manageable when faced with the inevitable horribleness (OP Depression) LTG: Maintain abilities at managing anxiety and have minimal panic attacks (Anxiety)  Progress Towards Goals: Progressing  Interventions: CBT, Motivational Interviewing, Solution Focused, and Supportive  Summary: Matthew Hutchinson is a 64 y.o. male with psych history of MDD and GAD, presenting for follow-up therapy session in efforts to improve management of depressive and anxious symptoms.   Patient actively engaged in session, presenting with overall pleasant moods and congruent affect. Pt reported ***, further detailing of recent med man visit with Arfeen, MD,       doing better this week than recent months, describing improved emotional regulation and decreased intensity of grief-related distress following late spouses passing. Pt processed events of the past week, noting increased activity  and efforts to stay occupied through home projects completed with son and ongoing business-related tasks.  Pt explored continued stress related to the current political climate and its perceived impact on business functioning, as well as long-standing distress tied to historical laws affecting same-sex marriage. Pt expressed feelings of guilt and rumination surrounding past limitations in healthcare access for spouse and the belief that marital status may have influenced outcomes.  Pt revisited concerns regarding bothersome skin growths and flare-ups of dermatologic conditions, expressing intent to schedule a dermatology appointment for further evaluation. Pt also reflected on increased engagement in hobbies and craft-related activities that had been previously neglected for nearly a decade, noting these as meaningful sources of distraction and fulfillment.  Pt acknowledged the emotional weight of recognizing that January marks the need to begin planning spouses funeral. Pt has connected with friends in [place] for support and discussed uncertainty around eulogy planning, expressing insight into the emotional difficulty and low likelihood of being able to coca cola personally.  Engaged in reassessment of presenting anxious and depressive symptoms via GAD-7 and PHQ-9, and processed overall progress toward treatment goals outlined in the individualized treatment plan.     07/24/2024    4:02 PM 05/30/2024    3:34 PM 03/13/2024    2:35 PM 12/13/2023    5:02 PM  GAD 7 : Generalized Anxiety Score  Nervous, Anxious, on Edge 2  2  2  2    Control/stop worrying 3  3  3  2    Worry too much - different things 3  1  3  1    Trouble relaxing 1  1  2  1    Restless 2  1  3  1    Easily annoyed or irritable 2  2  3  2    Afraid - awful might happen 3  2  3  1    Total  GAD 7 Score 16 12 19 10   Anxiety Difficulty Somewhat difficult Very difficult Extremely difficult Very difficult     Data saved with a previous  flowsheet row definition      07/24/2024    4:05 PM 05/30/2024    3:33 PM 03/13/2024    2:38 PM 12/13/2023    5:03 PM 11/30/2023    3:13 PM  Depression screen PHQ 2/9  Decreased Interest 1 1 3 1 1   Down, Depressed, Hopeless 1 1 2 1 3   PHQ - 2 Score 2 2 5 2 4   Altered sleeping 1 3 3  0 1  Tired, decreased energy 1 2 2  0 2  Change in appetite 3 2 3  0   Feeling bad or failure about yourself  1 1 3 1 3   Trouble concentrating 1 1 3 1 3   Moving slowly or fidgety/restless 1 3 3  0 2  Suicidal thoughts 0 0 0 0 0  PHQ-9 Score 10 14 22  4  15    Difficult doing work/chores Somewhat difficult Somewhat difficult Extremely dIfficult Somewhat difficult Very difficult     Data saved with a previous flowsheet row definition   Flowsheet Row Counselor from 11/15/2023 in [facility] from 11/24/2022 in [facility] from 10/05/2022 in [facility] at [facility] RISK CATEGORY Moderate Risk Low Risk Low Risk    Suicidal/Homicidal: None, No plan to harm self or others  Therapist Response:  Clinician *** openly greeted patient upon joining visit, assessing presenting mood and affect, engaging in introductory check-in. Utilized open ended questions in eliciting pt's recounts of events of the past week, utilizing active listening in providing support of pt's recounts of the past week. Utilized CBT to address grief-related cognitions and reduce guilt-based distortions, utilized MI to reinforce continuation of meaningful activities and task engagement, and provided supportive reflection and psychoeducation on grief processes and anticipatory stress. Therapist validated pts emotional experiences, reinforced adaptive coping efforts, and encouraged continued use of supports and structured activities while navigating grief-related tasks. Patient responded well to interventions. Patient  continues to meet criteria for MDD and GAD. Patient will continue to benefit from engagement in outpatient therapy due to being the least restrictive service to meet presenting needs. Pt proves to maintain progress towards goals.  [x]  Cognitive Challenging  []  Cognitive Refocusing  [x]  Cognitive Reframing  []  Communication Skills []  Compliance Issues  []  DBT  [x]  Exploration of Coping Patterns  [x]  Exploration of Emotions [x]  Exploration of Relationship Patterns  []  Guided Imagery  []  Interactive Feedback  []  Interpersonal Resolutions []  Mindfulness Training  []  Preventative Services  [x]  Psycho-Education  []  Relaxation/Deep Breathing []  Review of Treatment Plan/Progress  []  Role-Play/Behavioral Rehearsal  []  Structured Problem Solving  [x]  Supportive Reflection [x]  Symptom Management  []  Other   Homework: None.  Plan: Return again in 1 weeks.  Diagnosis:  Encounter Diagnoses  Name Primary?   Major depressive disorder, recurrent episode, mild Yes   GAD (generalized anxiety disorder)    Grief    Adjustment insomnia     Collaboration of Care: Psychiatrist AEB provider documentation available in EHR.  Patient/Guardian was advised Release of Information must be obtained prior to any record release in order to collaborate their care with an outside provider. Patient/Guardian was advised if they have not already done so to contact the registration department to sign all necessary forms in order for us  to  release information regarding their care.   Consent: Patient/Guardian gives verbal consent for treatment and assignment of benefits for services provided during this visit. Patient/Guardian expressed understanding and agreed to proceed.   Virtual Visit via Video Note  I connected with Harun Brumley Benton-Elliot on 08/01/24 at  3:00 PM EST by a video enabled telemedicine application and verified that I am speaking with the correct person using two identifiers.  Location: Patient:  Home Provider: Home Office   I discussed the limitations of evaluation and management by telemedicine and the availability of in person appointments. The patient expressed understanding and agreed to proceed.   The patient was advised to call back or seek an in-person evaluation if the symptoms worsen or if the condition fails to improve as anticipated.  I provided *** minutes of non-face-to-face time during this encounter.  Lynwood JONETTA Maris, MSW, LCSW 08/01/2024,  3:17 PM

## 2024-08-07 ENCOUNTER — Ambulatory Visit (HOSPITAL_COMMUNITY): Admitting: Licensed Clinical Social Worker

## 2024-08-07 DIAGNOSIS — F330 Major depressive disorder, recurrent, mild: Secondary | ICD-10-CM | POA: Diagnosis not present

## 2024-08-07 DIAGNOSIS — F411 Generalized anxiety disorder: Secondary | ICD-10-CM

## 2024-08-07 DIAGNOSIS — F5102 Adjustment insomnia: Secondary | ICD-10-CM | POA: Diagnosis not present

## 2024-08-07 DIAGNOSIS — F4321 Adjustment disorder with depressed mood: Secondary | ICD-10-CM | POA: Diagnosis not present

## 2024-08-09 ENCOUNTER — Encounter: Payer: Self-pay | Admitting: Family Medicine

## 2024-08-09 MED ORDER — FREESTYLE LIBRE 3 SENSOR MISC: 0 refills | Status: AC

## 2024-08-09 MED ORDER — FREESTYLE LIBRE 3 SENSOR MISC: 2 refills | Status: AC

## 2024-08-09 NOTE — Telephone Encounter (Signed)
 Copied from CRM #[id number]. Topic: Clinical - Medication Question >> Aug 09, 2024 12:03 PM Robinson H wrote: Reason for CRM: Patient returning call to [facility] regarding question about patients medication, Patient states he has the new sensors that he changes every 14 days, been on them since the summertime at least. Didn't think they made the 10's anymore.  Matthew Hutchinson [phone]

## 2024-08-13 ENCOUNTER — Ambulatory Visit (HOSPITAL_COMMUNITY): Admitting: Licensed Clinical Social Worker

## 2024-08-13 DIAGNOSIS — F330 Major depressive disorder, recurrent, mild: Secondary | ICD-10-CM

## 2024-08-13 DIAGNOSIS — F5102 Adjustment insomnia: Secondary | ICD-10-CM

## 2024-08-13 DIAGNOSIS — F4321 Adjustment disorder with depressed mood: Secondary | ICD-10-CM

## 2024-08-13 DIAGNOSIS — F411 Generalized anxiety disorder: Secondary | ICD-10-CM

## 2024-08-13 NOTE — Progress Notes (Unsigned)
 THERAPIST PROGRESS NOTE   Session Date: 08/13/2024  Session Time: 1406 - 1516  Participation Level: Active  MSE/Presentation: Behavior: Appropriate and Sharing Speech: Normal Thought Process: Coherent and Relevant Cognition: Alert and Appropriate Mood: Depressed and Euthymic Affect: Congruent Insight: Good Appearance: Casual  Type of Therapy: Individual Therapy  Treatment Goals addressed:   Progressing (8) LTG: Reduce frequency, intensity, and duration of depression symptoms so that daily functioning is improved (OP Depression) LTG: Increase coping skills to manage depression and improve ability to perform daily activities (OP Depression) STG: Reduce overall depression score by a minimum of 25% on the Patient Health Questionnaire (PHQ-9) (OP Depression) STG: Matthew Hutchinson will identify cognitive patterns and beliefs that support depression (OP Depression) STG: Matthew Hutchinson will reduce frequency of avoidant behaviors by 50% as evidenced by self-report in therapy sessions (Anxiety) LTG: Continue to keep depression manageable when faced with the inevitable horribleness (OP Depression) LTG: Maintain abilities at managing anxiety and have minimal panic attacks (Anxiety)  Progress Towards Goals: Progressing  Interventions: CBT, Motivational Interviewing, Solution Focused, and Supportive  Summary: Matthew Hutchinson is a 64 y.o. male with psych history of MDD and GAD, presenting for follow-up therapy session in efforts to improve management of depressive and anxious symptoms.   Pt actively engaged in session, presenting with mildly depressed, yet overall pleasant mood and congruent affect. 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 Pt acknowledged anticipatory stress related to  potential continuation of similar barriers over the coming week and impact on completing errands and routine responsibilities.  Pt discussed ongoing engagement in behavioral activation efforts, intentionally redirecting focus toward household tasks, hobbies, and personal interests to reduce rumination related to the loss of his late spouse. Pt acknowledged awareness of the need to begin funeral planning but reported significant emotional avoidance and difficulty initiating this process. Pt shared continued efforts to maintain connection with late spouse through internal dialogue and reminiscing, which provides comfort at times, while also noting persistent feelings of guilt and emotional distress that he attempts to manage through self-directed cognitive redirection. Pt further reported avoidance of movies and television shows associated with shared experiences or known interests of his late spouse due to emotional intensity and grief triggers.  Pt acknowledged overall emotional stability with expected fluctuations in mood and grief responses, demonstrating insight into the nonlinear nature of bereavement and continued efforts to engage in adaptive coping strategies.     07/24/2024    4:02 PM 05/30/2024    3:34 PM 03/13/2024    2:35 PM 12/13/2023    5:02 PM  GAD 7 : Generalized Anxiety Score  Nervous, Anxious, on Edge 2  2  2  2    Control/stop worrying 3  3  3  2    Worry too much - different things 3  1  3  1    Trouble relaxing 1  1  2  1    Restless 2  1  3  1    Easily annoyed or irritable 2  2  3  2    Afraid - awful might happen 3  2  3  1    Total GAD 7 Score 16 12 19 10   Anxiety Difficulty Somewhat difficult Very difficult Extremely difficult Very difficult     Data saved with a previous flowsheet row definition      07/24/2024  4:05 PM 05/30/2024    3:33 PM 03/13/2024    2:38 PM 12/13/2023    5:03 PM 11/30/2023    3:13 PM  Depression screen PHQ 2/9  Decreased Interest 1 1 3 1 1   Down,  Depressed, Hopeless 1 1 2 1 3   PHQ - 2 Score 2 2 5 2 4   Altered sleeping 1 3 3  0 1  Tired, decreased energy 1 2 2  0 2  Change in appetite 3 2 3  0   Feeling bad or failure about yourself  1 1 3 1 3   Trouble concentrating 1 1 3 1 3   Moving slowly or fidgety/restless 1 3 3  0 2  Suicidal thoughts 0 0 0 0 0  PHQ-9 Score 10 14 22  4  15    Difficult doing work/chores Somewhat difficult Somewhat difficult Extremely dIfficult Somewhat difficult Very difficult     Data saved with a previous flowsheet row definition   Flowsheet Row Counselor from 11/15/2023 in [facility] from 11/24/2022 in [facility] from 10/05/2022 in [facility] at [facility] RISK CATEGORY Moderate Risk Low Risk Low Risk    Suicidal/Homicidal: None, No plan to harm self or others  Therapist Response:  Clinician openly greeted patient upon joining visit, assessing presenting mood and affect, engaging in introductory check-in. Utilized open ended questions in eliciting pt's recounts of events of the past week, utilizing active listening in providing support of pt's recounts of the past week. Clinician utilized CBT, grief-focused supportive therapy, psychoeducation, and motivational interviewing to normalize grief responses, explore avoidance patterns, and reinforce behavioral activation and cognitive reframing strategies to address guilt and rumination. Clinician validated pts emotional experiences while encouraging gradual pacing toward grief-related tasks and continued engagement in meaningful activities. Pt was receptive and demonstrated insight into grief progression and coping efforts. Patient continues to meet criteria for MDD and GAD. Patient will continue to benefit from engagement in outpatient therapy due to being the least restrictive service to meet presenting needs. Pt proves to  maintain progress towards goals.  [x]  Cognitive Challenging  []  Cognitive Refocusing  [x]  Cognitive Reframing  []  Communication Skills []  Compliance Issues  []  DBT  [x]  Exploration of Coping Patterns  [x]  Exploration of Emotions [x]  Exploration of Relationship Patterns  []  Guided Imagery  []  Interactive Feedback  []  Interpersonal Resolutions []  Mindfulness Training  []  Preventative Services  [x]  Psycho-Education  []  Relaxation/Deep Breathing []  Review of Treatment Plan/Progress  []  Role-Play/Behavioral Rehearsal  [x]  Structured Problem Solving  [x]  Supportive Reflection [x]  Symptom Management  []  Other   Homework: None.  Plan: Return again in 1 weeks.  Diagnosis:  Encounter Diagnoses  Name Primary?   Major depressive disorder, recurrent episode, mild Yes   GAD (generalized anxiety disorder)    Grief    Adjustment insomnia     Collaboration of Care: Psychiatrist AEB provider documentation available in EHR.  Patient/Guardian was advised Release of Information must be obtained prior to any record release in order to collaborate their care with an outside provider. Patient/Guardian was advised if they have not already done so to contact the registration department to sign all necessary forms in order for us  to release information regarding their care.   Consent: Patient/Guardian gives verbal consent for treatment and assignment of benefits for services provided during this visit. Patient/Guardian expressed understanding and agreed to proceed.   Virtual Visit via Video Note  I  connected with Matthew Hutchinson on 08/13/24 at  2:00 PM EST by a video enabled telemedicine application and verified that I am speaking with the correct person using two identifiers.  Location: Patient: Home Provider: Home Office   I discussed the limitations of evaluation and management by telemedicine and the availability of in person appointments. The patient expressed understanding and agreed to  proceed.   The patient was advised to call back or seek an in-person evaluation if the symptoms worsen or if the condition fails to improve as anticipated.  I provided 70 minutes of non-face-to-face time during this encounter.  Lynwood JONETTA Maris, MSW, LCSW 08/13/2024,  2:08 PM

## 2024-08-21 ENCOUNTER — Ambulatory Visit: Admitting: Podiatry

## 2024-08-21 ENCOUNTER — Ambulatory Visit (HOSPITAL_COMMUNITY): Admitting: Licensed Clinical Social Worker

## 2024-08-28 ENCOUNTER — Ambulatory Visit (HOSPITAL_COMMUNITY): Admitting: Licensed Clinical Social Worker

## 2024-08-30 ENCOUNTER — Ambulatory Visit: Admitting: Family Medicine

## 2024-09-04 ENCOUNTER — Ambulatory Visit (HOSPITAL_COMMUNITY): Admitting: Licensed Clinical Social Worker

## 2024-09-11 ENCOUNTER — Ambulatory Visit (HOSPITAL_COMMUNITY): Admitting: Licensed Clinical Social Worker

## 2024-09-17 ENCOUNTER — Ambulatory Visit (HOSPITAL_COMMUNITY): Admitting: Licensed Clinical Social Worker

## 2024-09-25 ENCOUNTER — Ambulatory Visit (HOSPITAL_COMMUNITY): Admitting: Licensed Clinical Social Worker

## 2024-10-01 ENCOUNTER — Ambulatory Visit (HOSPITAL_COMMUNITY): Admitting: Licensed Clinical Social Worker

## 2024-10-09 ENCOUNTER — Ambulatory Visit (HOSPITAL_COMMUNITY): Admitting: Licensed Clinical Social Worker

## 2024-10-29 ENCOUNTER — Telehealth (HOSPITAL_COMMUNITY): Admitting: Psychiatry

## 2024-11-01 ENCOUNTER — Telehealth (HOSPITAL_COMMUNITY): Admitting: Psychiatry
